# Patient Record
Sex: Female | Born: 1990 | Race: White | Hispanic: Yes | Marital: Single | Smoking: Never smoker
Health system: Southern US, Community
[De-identification: ages and names within clinical notes are randomized; demographics above are authoritative.]

## PROBLEM LIST (undated history)

## (undated) ENCOUNTER — Inpatient Hospital Stay (HOSPITAL_COMMUNITY): Payer: Self-pay

## (undated) DIAGNOSIS — I2699 Other pulmonary embolism without acute cor pulmonale: Secondary | ICD-10-CM

## (undated) DIAGNOSIS — D649 Anemia, unspecified: Secondary | ICD-10-CM

## (undated) DIAGNOSIS — Z789 Other specified health status: Secondary | ICD-10-CM

## (undated) HISTORY — PX: MOUTH SURGERY: SHX715

## (undated) HISTORY — PX: WISDOM TOOTH EXTRACTION: SHX21

## (undated) HISTORY — DX: Other specified health status: Z78.9

## (undated) HISTORY — PX: ABDOMINAL HYSTERECTOMY: SHX81

---
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
## 2011-02-17 LAB — POCT I-STAT, CHEM 8
BUN: 10 mg/dL (ref 6–23)
Calcium, Ion: 1.16 mmol/L (ref 1.12–1.32)
HCT: 35 % — ABNORMAL LOW (ref 36.0–46.0)
Hemoglobin: 11.9 g/dL — ABNORMAL LOW (ref 12.0–15.0)
Sodium: 137 mEq/L (ref 135–145)
TCO2: 22 mmol/L (ref 0–100)

## 2011-02-19 LAB — URINALYSIS, ROUTINE W REFLEX MICROSCOPIC
Glucose, UA: NEGATIVE mg/dL
Ketones, ur: 15 mg/dL — AB
Nitrite: NEGATIVE
Protein, ur: NEGATIVE mg/dL
Urobilinogen, UA: 0.2 mg/dL (ref 0.0–1.0)

## 2011-02-19 LAB — WET PREP, GENITAL: Trich, Wet Prep: NONE SEEN

## 2011-02-19 LAB — GC/CHLAMYDIA PROBE AMP, GENITAL: GC Probe Amp, Genital: NEGATIVE

## 2011-03-17 ENCOUNTER — Inpatient Hospital Stay (HOSPITAL_COMMUNITY)
Admission: AD | Admit: 2011-03-17 | Discharge: 2011-03-20 | DRG: 775 | Disposition: A | Discharge status: Home / Self Care | Payer: Medicaid Other | Source: Ambulatory Visit | Attending: Obstetrics | Admitting: Obstetrics

## 2011-03-17 LAB — CBC
Hemoglobin: 13.3 g/dL (ref 12.0–15.0)
MCH: 28.9 pg (ref 26.0–34.0)
MCV: 83.7 fL (ref 78.0–100.0)
Platelets: 192 10*3/uL (ref 150–400)
RBC: 4.6 MIL/uL (ref 3.87–5.11)
WBC: 10.6 10*3/uL — ABNORMAL HIGH (ref 4.0–10.5)

## 2011-03-19 LAB — CBC
HCT: 30.2 % — ABNORMAL LOW (ref 36.0–46.0)
Hemoglobin: 10.2 g/dL — ABNORMAL LOW (ref 12.0–15.0)
MCH: 28.5 pg (ref 26.0–34.0)
MCHC: 33.8 g/dL (ref 30.0–36.0)
RBC: 3.58 MIL/uL — ABNORMAL LOW (ref 3.87–5.11)

## 2011-05-08 ENCOUNTER — Inpatient Hospital Stay (HOSPITAL_COMMUNITY): Admission: AD | Admit: 2011-05-08 | Payer: Medicaid Other | Source: Ambulatory Visit | Admitting: Obstetrics

## 2014-12-03 ENCOUNTER — Ambulatory Visit (INDEPENDENT_AMBULATORY_CARE_PROVIDER_SITE_OTHER): Payer: Medicaid Other | Admitting: Obstetrics and Gynecology

## 2014-12-03 ENCOUNTER — Encounter: Payer: Self-pay | Admitting: Obstetrics and Gynecology

## 2014-12-03 VITALS — BP 102/63 | HR 83 | Temp 98.2°F | Wt 140.7 lb

## 2014-12-03 DIAGNOSIS — O0933 Supervision of pregnancy with insufficient antenatal care, third trimester: Secondary | ICD-10-CM | POA: Insufficient documentation

## 2014-12-03 DIAGNOSIS — Z1151 Encounter for screening for human papillomavirus (HPV): Secondary | ICD-10-CM | POA: Diagnosis not present

## 2014-12-03 DIAGNOSIS — Z118 Encounter for screening for other infectious and parasitic diseases: Secondary | ICD-10-CM

## 2014-12-03 DIAGNOSIS — Z113 Encounter for screening for infections with a predominantly sexual mode of transmission: Secondary | ICD-10-CM | POA: Diagnosis not present

## 2014-12-03 DIAGNOSIS — Z124 Encounter for screening for malignant neoplasm of cervix: Secondary | ICD-10-CM

## 2014-12-03 DIAGNOSIS — O09213 Supervision of pregnancy with history of pre-term labor, third trimester: Secondary | ICD-10-CM

## 2014-12-03 LAB — POCT URINALYSIS DIP (DEVICE)
Bilirubin Urine: NEGATIVE
GLUCOSE, UA: NEGATIVE mg/dL
Hgb urine dipstick: NEGATIVE
Ketones, ur: NEGATIVE mg/dL
LEUKOCYTES UA: NEGATIVE
NITRITE: NEGATIVE
PH: 7.5 (ref 5.0–8.0)
Protein, ur: NEGATIVE mg/dL
Specific Gravity, Urine: 1.015 (ref 1.005–1.030)
UROBILINOGEN UA: 0.2 mg/dL (ref 0.0–1.0)

## 2014-12-03 NOTE — Progress Notes (Signed)
Subjective:    Maria Riley is a [id number] [id number] being seen today for her first obstetrical visit.  Her obstetrical history is significant for previous 36 week delivery with Dr. Gaynell FaceMarshall in 2012, uncomplicated pregnancy and insufficient care (onset of care at 34 weeks by LMP). Patient reports uncomplicated pregnancy thus far with the exception of a [place] in [place] secondary to vaginal bleeding and contractions. Patient received BMZ on 12/14 and 12/15. Patient does intend to breast feed. Pregnancy history fully reviewed.  Patient reports vaginal bleeding.  Filed Vitals:   12/03/14 1012  BP: 102/63  Pulse: 83  Temp: 98.2 F (36.8 C)  Weight: 140 lb 11.2 oz (63.821 kg)    HISTORY: OB History  Gravida Para Term Preterm AB SAB TAB Ectopic Multiple Living  2 1 0 1      1    # Outcome Date GA Lbr Len/2nd Weight Sex Delivery Anes PTL Lv  2 Current           1 Preterm 03/18/11 369w0d   M Vag-Spont        Past Medical History  Diagnosis Date  . Medical history non-contributory    History reviewed. No pertinent past surgical history. Family History  Problem Relation Age of Onset  . Alcohol abuse Neg Hx   . Arthritis Neg Hx   . Asthma Neg Hx   . Birth defects Neg Hx   . Cancer Neg Hx   . COPD Neg Hx   . Depression Neg Hx   . Diabetes Neg Hx   . Drug abuse Neg Hx   . Early death Neg Hx   . Hearing loss Neg Hx   . Heart disease Neg Hx   . Hyperlipidemia Neg Hx   . Hypertension Neg Hx   . Kidney disease Neg Hx   . Learning disabilities Neg Hx   . Mental illness Neg Hx   . Mental retardation Neg Hx   . Miscarriages / Stillbirths Neg Hx   . Stroke Neg Hx   . Vision loss Neg Hx   . Varicose Veins Neg Hx      Exam    Uterus:     Pelvic Exam:    Perineum: Normal Perineum   Vulva: normal   Vagina:  normal mucosa, normal discharge, no blood in vault   pH:    Cervix: ft/soft/long   Adnexa: not evaluated   Bony Pelvis: gynecoid  System: Breast:  normal  appearance, no masses or tenderness   Skin: normal coloration and turgor, no rashes    Neurologic: oriented, no focal deficits   Extremities: normal strength, tone, and muscle mass   HEENT extra ocular movement intact   Mouth/Teeth mucous membranes moist, pharynx normal without lesions and dental hygiene good   Neck supple and no masses   Cardiovascular: regular rate and rhythm   Respiratory:  chest clear, no wheezing, crepitations, rhonchi, normal symmetric air entry   Abdomen: soft, gravid, NT   Urinary:       Assessment:    Pregnancy: G2P0101 Patient Active Problem List   Diagnosis Date Noted  . Insufficient prenatal care in third trimester 12/03/2014  . Previous preterm delivery in third trimester, antepartum 12/03/2014        Plan:     Initial labs drawn. Prenatal vitamins. Problem list reviewed and updated. Genetic Screening discussed : too late.  Ultrasound discussed; fetal survey: ordered. No blood seen in vault. Precautions given.   Follow up  in 1 weeks. 50% of 30 min visit spent on counseling and coordination of care.     Maria Riley 12/03/2014

## 2014-12-03 NOTE — Progress Notes (Signed)
Pt reports she was given Betamethasone on 12/14 and 12/15 because they are concerned that she will go into labor early. She was bleeding at that time.  Today she notices blood tinged mucus.  Pt doesn't have ultrasound report with her but reports that her lmp is 04/08/14. 1hr due at 1125

## 2014-12-03 NOTE — Progress Notes (Signed)
U/S 12/05/14 @ 330p with Radiology.

## 2014-12-03 NOTE — Patient Instructions (Signed)
Tercer trimestre de embarazo (Third Trimester of Pregnancy) El tercer trimestre va desde la semana29 hasta la 42, desde el sptimo hasta el noveno mes, y es la poca en la que el feto crece ms rpidamente. Hacia el final del noveno mes, el feto mide alrededor de 20pulgadas (45cm) de largo y pesa entre 6 y 10 libras (2,700 y 4,500kg).  CAMBIOS EN EL ORGANISMO Su organismo atraviesa por muchos cambios durante el embarazo, y estos varan de una mujer a otra.   Seguir aumentando de peso. Es de esperar que aumente entre 25 y 35libras (11 y 16kg) hacia el final del embarazo.  Podrn aparecer las primeras estras en las caderas, el abdomen y las mamas.  Puede tener necesidad de orinar con ms frecuencia porque el feto baja hacia la pelvis y ejerce presin sobre la vejiga.  Debido al embarazo podr sentir acidez estomacal con frecuencia.  Puede estar estreida, ya que ciertas hormonas enlentecen los movimientos de los msculos que empujan los desechos a travs de los intestinos.  Pueden aparecer hemorroides o abultarse e hincharse las venas (venas varicosas).  Puede sentir dolor plvico debido al aumento de peso y a que las hormonas del embarazo relajan las articulaciones entre los huesos de la pelvis. El dolor de espalda puede ser consecuencia de la sobrecarga de los msculos que soportan la postura.  Tal vez haya cambios en el cabello que pueden incluir su engrosamiento, crecimiento rpido y cambios en la textura. Adems, a algunas mujeres se les cae el cabello durante o despus del embarazo, o tienen el cabello seco o fino. Lo ms probable es que el cabello se le normalice despus del nacimiento del beb.  Las mamas seguirn creciendo y le dolern. A veces, puede haber una secrecin amarilla de las mamas llamada calostro.  El ombligo puede salir hacia afuera.  Puede sentir que le falta el aire debido a que se expande el tero.  Puede notar que el feto "baja" o lo siente ms bajo, en  el abdomen.  Puede tener una prdida de secrecin mucosa con sangre. Esto suele ocurrir en el trmino de unos pocos das a una semana antes de que comience el trabajo de parto.  El cuello del tero se vuelve delgado y blando (se borra) cerca de la fecha de parto. QU DEBE ESPERAR EN LOS EXMENES PRENATALES  Le harn exmenes prenatales cada 2semanas hasta la semana36. A partir de ese momento le harn exmenes semanales. Durante una visita prenatal de rutina:  La pesarn para asegurarse de que usted y el feto estn creciendo normalmente.  Le tomarn la presin arterial.  Le medirn el abdomen para controlar el desarrollo del beb.  Se escucharn los latidos cardacos fetales.  Se evaluarn los resultados de los estudios solicitados en visitas anteriores.  Le revisarn el cuello del tero cuando est prxima la fecha de parto para controlar si este se ha borrado. Alrededor de la semana36, el mdico le revisar el cuello del tero. Al mismo tiempo, realizar un anlisis de las secreciones del tejido vaginal. Este examen es para determinar si hay un tipo de bacteria, estreptococo Grupo B. El mdico le explicar esto con ms detalle. El mdico puede preguntarle lo siguiente:  Cmo le gustara que fuera el parto.  Cmo se siente.  Si siente los movimientos del beb.  Si ha tenido sntomas anormales, como prdida de lquido, sangrado, dolores de cabeza intensos o clicos abdominales.  Si tiene alguna pregunta. Otros exmenes o estudios de deteccin que pueden realizarse   durante el tercer trimestre incluyen lo siguiente:  Anlisis de sangre para controlar las concentraciones de hierro (anemia).  Controles fetales para determinar su salud, nivel de actividad y crecimiento. Si tiene alguna enfermedad o hay problemas durante el embarazo, le harn estudios. FALSO TRABAJO DE PARTO Es posible que sienta contracciones leves e irregulares que finalmente desaparecen. Se llaman contracciones de  Braxton Hicks o falso trabajo de parto. Las contracciones pueden durar horas, das o incluso semanas, antes de que el verdadero trabajo de parto se inicie. Si las contracciones ocurren a intervalos regulares, se intensifican o se hacen dolorosas, lo mejor es que la revise el mdico.  SIGNOS DE TRABAJO DE PARTO   Clicos de tipo menstrual.  Contracciones cada 5minutos o menos.  Contracciones que comienzan en la parte superior del tero y se extienden hacia abajo, a la zona inferior del abdomen y la espalda.  Sensacin de mayor presin en la pelvis o dolor de espalda.  Una secrecin de mucosidad acuosa o con sangre que sale de la vagina. Si tiene alguno de estos signos antes de la semana37 del embarazo, llame a su mdico de inmediato. Debe concurrir al hospital para que la controlen inmediatamente. INSTRUCCIONES PARA EL CUIDADO EN EL HOGAR   Evite fumar, consumir hierbas, beber alcohol y tomar frmacos que no le hayan recetado. Estas sustancias qumicas afectan la formacin y el desarrollo del beb.  Siga las indicaciones del mdico en relacin con el uso de medicamentos. Durante el embarazo, hay medicamentos que son seguros de tomar y otros que no.  Haga actividad fsica solo en la forma indicada por el mdico. Sentir clicos uterinos es un buen signo para detener la actividad fsica.  Contine comiendo alimentos que sanos con regularidad.  Use un sostn que le brinde buen soporte si le duelen las mamas.  No se d baos de inmersin en agua caliente, baos turcos ni saunas.  Colquese el cinturn de seguridad cuando conduzca.  No coma carne cruda ni queso sin cocinar; evite el contacto con las bandejas sanitarias de los gatos y la tierra que estos animales usan. Estos elementos contienen grmenes que pueden causar defectos congnitos en el beb.  Tome las vitaminas prenatales.  Si est estreida, pruebe un laxante suave (si el mdico lo autoriza). Consuma ms alimentos ricos en  fibra, como vegetales y frutas frescos y cereales integrales. Beba gran cantidad de lquido para mantener la orina de tono claro o color amarillo plido.  Dese baos de asiento con agua tibia para aliviar el dolor o las molestias causadas por las hemorroides. Use una crema para las hemorroides si el mdico la autoriza.  Si tiene venas varicosas, use medias de descanso. Eleve los pies durante 15minutos, 3 o 4veces por da. Limite la cantidad de sal en su dieta.  Evite levantar objetos pesados, use zapatos de tacones bajos y mantenga una buena postura.  Descanse con las piernas elevadas si tiene calambres o dolor de cintura.  Visite a su dentista si no lo ha hecho durante el embarazo. Use un cepillo de dientes blando para higienizarse los dientes y psese el hilo dental con suavidad.  Puede seguir manteniendo relaciones sexuales, a menos que el mdico le indique lo contrario.  No haga viajes largos excepto que sea absolutamente necesario y solo con la autorizacin del mdico.  Tome clases prenatales para entender, practicar y hacer preguntas sobre el trabajo de parto y el parto.  Haga un ensayo de la partida al hospital.  Prepare el bolso que   llevar al hospital.  Prepare la habitacin del beb.  Concurra a todas las visitas prenatales segn las indicaciones de su mdico. SOLICITE ATENCIN MDICA SI:  No est segura de que est en trabajo de parto o de que ha roto la bolsa de las aguas.  Tiene mareos.  Siente clicos leves, presin en la pelvis o dolor persistente en el abdomen.  Tiene nuseas, vmitos o diarrea persistentes.  Tiene secrecin vaginal con mal olor.  Siente dolor al orinar. SOLICITE ATENCIN MDICA DE INMEDIATO SI:   Tiene fiebre.  Tiene una prdida de lquido por la vagina.  Tiene sangrado o pequeas prdidas vaginales.  Siente dolor intenso o clicos en el abdomen.  Sube o baja de peso rpidamente.  Tiene dificultad para respirar y siente dolor de  pecho.  Sbitamente se le hinchan mucho el rostro, las manos, los tobillos, los pies o las piernas.  No ha sentido los movimientos del beb durante una hora.  Siente un dolor de cabeza intenso que no se alivia con medicamentos.  Hay cambios en la visin. Document Released: 09/02/2005 Document Revised: 11/28/2013 [employer] Patient Information 2015 [employer]. This information is not intended to replace advice given to you by your health care provider. Make sure you discuss any questions you have with your health care provider.  Eleccin del mtodo anticonceptivo (Contraception Choices) La anticoncepcin (control de la natalidad) es el uso de cualquier mtodo o dispositivo para evitar el embarazo. A continuacin se indican algunos de esos mtodos. MTODOS HORMONALES   El Implante contraconceptivo consiste en un tubo plstico delgado que contiene la hormona progesterona. No contiene estrgenos. El mdico inserta el tubo en la parte interna del brazo. El tubo puede permanecer en el lugar durante 3 aos. Despus de los 3 aos debe retirarse. El implante impide que los ovarios liberen vulos (ovulacin), espesa el moco cervical, lo que evita que los espermatozoides ingresen al tero y hace ms delgada la membrana que cubre el interior del tero.  Inyecciones de progesterona sola: las administra el mdico cada 3 meses para evitar el embarazo. La progesterona sinttica impide que los ovarios liberen vulos. Tambin hacen que el moco cervical se espese y modifique el tejido de recubrimiento interno del tero. Esto hace ms difcil que los espermatozoides sobrevivan en el tero.  Las pldoras anticonceptivas contienen estrgenos y progesterona. Su funcin es evitar que los ovarios liberen vulos (ovulacin). Las hormonas de los anticonceptivos orales hacen que el moco cervical se haga ms espeso, lo que evita que el esperma ingrese al tero. Las pldoras anticonceptivas son recetadas por el  mdico.Tambin se utilizan para tratar los perodos menstruales abundantes.  Minipldora: este tipo de pldora anticonceptiva contiene slo hormona progesterona. Deben tomarse todos los das del mes y debe recetarlas el mdico.  El parche de control de natalidad: contiene hormonas similares a las que contienen las pldoras anticonceptivas. Deben cambiarse una vez por semana y se utilizan bajo prescripcin mdica.  Anillo vaginal: contiene hormonas similares a las que contienen las pldoras anticonceptivas. Se deja colocado durante tres semanas, se lo retira durante 1 semana y luego se coloca uno nuevo. La paciente debe sentirse cmoda al insertar y retirar el anillo de la vagina.Es necesaria la prescripcin mdica.  Anticonceptivos de emergencia: son mtodos para evitar un embarazo despus de una relacin sexual sin proteccin. Esta pldora puede tomarse inmediatamente despus de tener relaciones sexuales o hasta 5 das de haber tenido sexo sin proteccin. Es ms efectiva si se toma poco tiempo despus de   la relacin sexual. Los anticonceptivos de emergencia estn disponibles sin prescripcin mdica. Consltelo con su farmacutico. No use los anticonceptivos de emergencia como nico mtodo anticonceptivo. MTODOS DE BARRERA   Condn masculino: es una vaina delgada (ltex o goma) que se coloca cubriendo al pene durante el acto sexual. Puede usarse con espermicida para aumentar la efectividad.  Condn femenino. Es una funda delicada y blanda que se adapta holgadamente a la vagina antes de las relaciones sexuales.  Diafragma: es una barrera de ltex redonda y suave que debe ser recomendado por un profesional. Se inserta en la vagina, junto con un gel espermicida. Debe insertarse antes de tener relaciones sexuales. Debe dejar el diafragma colocado en la vagina durante 6 a 8 horas despus de la relacin sexual.  Capuchn cervical: es una barrera de ltex o taza plstica redonda y suave que cubre el  cuello del tero y debe ser colocada por un mdico. Puede dejarlo colocado en la vagina hasta 48 horas despus de las relaciones sexuales.  Esponja: es una pieza blanda y circular de espuma de poliuretano. Contiene un espermicida. Se inserta en la vagina despus de mojarla y antes de las relaciones sexuales.  Espermicidas: son sustancias qumicas que matan o bloquean al esperma y no lo dejan ingresar al cuello del tero y al tero. Vienen en forma de cremas, geles, supositorios, espuma o comprimidos. No es necesario tener receta mdica. Se insertan en la vagina con un aplicador antes de tener relaciones sexuales. El proceso debe repetirse cada vez que tiene relaciones sexuales. ANTICONCEPTIVOS INTRAUTERINOS  Dispositivo intrauterino (DIU) es un dispositivo en forma de T que se coloca en el tero durante el perodo menstrual, para evitar el embarazo. Hay dos tipos:  DIU de cobre: este tipo de DIU est recubierto con un alambre de cobre y se inserta dentro del tero. El cobre hace que el tero y las trompas de Falopio produzcan un liquido que destruye los espermatozoides. Puede permanecer colocado durante 10 aos.  DIU con hormona: este tipo de DIU contiene la hormona progestina (progesterona sinttica). La hormona espesa el moco cervical y evita que los espermatozoides ingresen al tero y tambin afina la membrana que cubre el tero para evitar la implantacin del vulo fertilizado. La hormona debilita o destruye los espermatozoides que ingresan al tero. Puede permanecer en el lugar durante 3-5 aos, segn el tipo de DIU que se utilice. MTODOS ANTICONCEPTIVOS PERMANENTES  Ligadura de trompas en la mujer: se realiza sellando, atando u obstruyendo quirrgicamente las trompas de Falopio lo que impide que el vulo descienda hacia el tero.  Esterilizacin histeroscpica: Implica la colocacin de un pequeo espiral o la insercin en cada trompa de Falopio. El mdico utiliza una tcnica llamada  histeroscopa para realizar este procedimiento. El dispositivo produce la formacin de tejido cicatrizal. Esto da como resultado una obstruccin permanente de las trompas de Falopio, de modo que la esperma no pueda fertilizar el vulo. Demora alrededor de 3 meses despus del procedimiento hasta que el conducto se obstruye. Tendr que usar otro mtodo anticonceptivo durante al menos 3 meses.  Esterilizacin masculina: se realiza ligando los conductos por los que pasan los espermatozoides (vasectoma).Esto impide que el esperma ingrese a la vagina durante el acto sexual. Luego del procedimiento, el hombre puede eyacular lquido (semen). MTODOS DE PLANIFICACIN NATURAL  Planificacin familiar natural: consiste en no tener relaciones sexuales o usar un mtodo de barrera (condn, diafragma, capuchn cervical) en los das que la mujer podra quedar embarazada.  Mtodo de calendario: consiste   en el seguimiento de la duracin de cada ciclo menstrual y la identificacin de los perodos frtiles.  Mtodo de ovulacin: consiste en evitar las relaciones sexuales durante la ovulacin.  Mtodo sintotrmico: consiste en evitar las relaciones sexuales en la poca en la que se est ovulando, utilizando un termmetro y tendiendo en cuenta los sntomas de la ovulacin.  Mtodo postovulacin: consiste en planificar las relaciones sexuales para despus de haber ovulado. Independientemente del tipo o mtodo anticonceptivo que usted elija, es importante que use condones para protegerse contra las infecciones de transmisin sexual (ETS). Hable con su mdico con respecto a qu mtodo anticonceptivo es el ms apropiado para usted. Document Released: 11/23/2005 Document Revised: 07/26/2013 [employer] Patient Information 2015 [employer]. This information is not intended to replace advice given to you by your health care provider. Make sure you discuss any questions you have with your health care provider.  Lactancia  materna (Breastfeeding) Decidir amamantar es una de las mejores elecciones que puede hacer por usted y su beb. El cambio hormonal durante el embarazo produce el desarrollo del tejido mamario y aumenta la cantidad y el tamao de los conductos galactforos. Estas hormonas tambin permiten que las protenas, los azcares y las grasas de la sangre produzcan la leche materna en las glndulas productoras de leche. Las hormonas impiden que la leche materna sea liberada antes del nacimiento del beb, adems de impulsar el flujo de leche luego del nacimiento. Una vez que ha comenzado a amamantar, pensar en el beb, as como la succin o el llanto, pueden estimular la liberacin de leche de las glndulas productoras de leche.  LOS BENEFICIOS DE AMAMANTAR Para el beb  La primera leche (calostro) ayuda a mejorar el funcionamiento del sistema digestivo del beb.  La leche tiene anticuerpos que ayudan a prevenir las infecciones en el beb.  El beb tiene una menor incidencia de asma, alergias y del sndrome de muerte sbita del lactante.  Los nutrientes en la leche materna son mejores para el beb que la leche maternizada y estn preparados exclusivamente para cubrir las necesidades del beb.  La leche materna mejora el desarrollo cerebral del beb.  Es menos probable que el beb desarrolle otras enfermedades, como obesidad infantil, asma o diabetes mellitus de tipo 2. Para usted   La lactancia materna favorece el desarrollo de un vnculo muy especial entre la madre y el beb.  Es conveniente. La leche materna siempre est disponible a la temperatura correcta y es econmica.  La lactancia materna ayuda a quemar caloras y a perder el peso ganado durante el embarazo.  Favorece la contraccin del tero al tamao que tena antes del embarazo de manera ms rpida y disminuye el sangrado (loquios) despus del parto.  La lactancia materna contribuye a reducir el riesgo de desarrollar diabetes mellitus de  tipo 2, osteoporosis o cncer de mama o de ovario en el futuro. SIGNOS DE QUE EL BEB EST HAMBRIENTO Primeros signos de hambre  Aumenta su estado de alerta o actividad.  Se estira.  Mueve la cabeza de un lado a otro.  Mueve la cabeza y abre la boca cuando se le toca la mejilla o la comisura de la boca (reflejo de bsqueda).  Aumenta las vocalizaciones, tales como sonidos de succin, se relame los labios, emite arrullos, suspiros, o chirridos.  Mueve la mano hacia la boca.  Se chupa con ganas los dedos o las manos. Signos tardos de hambre  Est agitado.  Llora de manera intermitente. Signos de hambre extrema   Los signos de hambre extrema requerirn que lo calme y lo consuele antes de que el beb pueda alimentarse adecuadamente. No espere a que se manifiesten los siguientes signos de hambre extrema para comenzar a amamantar:   Agitacin.  Llanto intenso y fuerte.   Gritos. INFORMACIN BSICA SOBRE LA LACTANCIA MATERNA Iniciacin de la lactancia materna  Encuentre un lugar cmodo para sentarse o acostarse, con un buen respaldo para el cuello y la espalda.  Coloque una almohada o una manta enrollada debajo del beb para acomodarlo a la altura de la mama (si est sentada). Las almohadas para amamantar se han diseado especialmente a fin de servir de apoyo para los brazos y el beb mientras amamanta.  Asegrese de que el abdomen del beb est frente al suyo.  Masajee suavemente la mama. Con las yemas de los dedos, masajee la pared del pecho hacia el pezn en un movimiento circular. Esto estimula el flujo de leche. Es posible que deba continuar este movimiento mientras amamanta si la leche fluye lentamente.  Sostenga la mama con el pulgar por arriba del pezn y los otros 4 dedos por debajo de la mama. Asegrese de que los dedos se encuentren lejos del pezn y de la boca del beb.  Empuje suavemente los labios del beb con el pezn o con el dedo.  Cuando la boca del beb se abra  lo suficiente, acrquelo rpidamente a la mama e introduzca todo el pezn y la zona oscura que lo rodea (areola), tanto como sea posible, dentro de la boca del beb.  Debe haber ms areola visible por arriba del labio superior del beb que por debajo del labio inferior.  La lengua del beb debe estar entre la enca inferior y la mama.  Asegrese de que la boca del beb est en la posicin correcta alrededor del pezn (prendida). Los labios del beb deben crear un sello sobre la mama y estar doblados hacia afuera (invertidos).  Es comn que el beb succione durante 2 a 3 minutos para que comience el flujo de leche materna. Cmo debe prenderse Es muy importante que le ensee al beb cmo prenderse adecuadamente a la mama. Si el beb no se prende adecuadamente, puede causarle dolor en el pezn y reducir la produccin de leche materna, y hacer que el beb tenga un escaso aumento de peso. Adems, si el beb no se prende adecuadamente al pezn, puede tragar aire durante la alimentacin. Esto puede causarle molestias al beb. Hacer eructar al beb al cambiar de mama puede ayudarlo a liberar el aire. Sin embargo, ensearle al beb cmo prenderse a la mama adecuadamente es la mejor manera de evitar que se sienta molesto por tragar aire mientras se alimenta. Signos de que el beb se ha prendido adecuadamente al pezn:   Tironea o succiona de modo silencioso, sin causarle dolor.  Se escucha que traga cada 3 o 4 succiones.   Hay movimientos musculares por arriba y por delante de sus odos al succionar. Signos de que el beb no se ha prendido adecuadamente al pezn:   Hace ruidos de succin o de chasquido mientras se alimenta.  Siente dolor en el pezn. Si cree que el beb no se prendi correctamente, deslice el dedo en la comisura de la boca y colquelo entre las encas del beb para interrumpir la succin. Intente comenzar a amamantar nuevamente. Signos de lactancia materna exitosa Signos del beb:    Disminuye gradualmente el nmero de succiones o cesa la succin por completo.  Se   duerme.  Relaja el cuerpo.  Retiene una pequea cantidad de leche en la boca.  Se desprende solo del pecho. Signos que presenta usted:  Las mamas han aumentado la firmeza, el peso y el tamao 1 a 3 horas despus de amamantar.  Estn ms blandas inmediatamente despus de amamantar.  Un aumento del volumen de leche, y tambin un cambio en su consistencia y color se producen hacia el quinto da de lactancia materna.  Los pezones no duelen, ni estn agrietados ni sangran. Signos de que su beb recibe la cantidad de leche suficiente  Moja al menos 3 paales en 24 horas. La orina debe ser clara y de color amarillo plido a los 5 das de vida.  Defeca al menos 3 veces en 24 horas a los 5 das de vida. La materia fecal debe ser blanda y amarillenta.  Defeca al menos 3 veces en 24 horas a los 7 das de vida. La materia fecal debe ser grumosa y amarillenta.  No registra una prdida de peso mayor del 10% del peso al nacer durante los primeros 3 das de vida.  Aumenta de peso un promedio de 4 a 7onzas (113 a 198g) por semana despus de los 4 das de vida.  Aumenta de peso, diariamente, de manera uniforme a partir de los 5 das de vida, sin registrar prdida de peso despus de las 2semanas de vida. Despus de alimentarse, es posible que el beb regurgite una pequea cantidad. Esto es frecuente. FRECUENCIA Y DURACIN DE LA LACTANCIA MATERNA El amamantamiento frecuente la ayudar a producir ms leche y a prevenir problemas de dolor en los pezones e hinchazn en las mamas. Alimente al beb cuando muestre signos de hambre o si siente la necesidad de reducir la congestin de las mamas. Esto se denomina "lactancia a demanda". Evite el uso del chupete mientras trabaja para establecer la lactancia (las primeras 4 a 6 semanas despus del nacimiento del beb). Despus de este perodo, podr ofrecerle un chupete. Las  investigaciones demostraron que el uso del chupete durante el primer ao de vida del beb disminuye el riesgo de desarrollar el sndrome de muerte sbita del lactante (SMSL). Permita que el nio se alimente en cada mama todo lo que desee. Contine amamantando al beb hasta que haya terminado de alimentarse. Cuando el beb se desprende o se queda dormido mientras se est alimentando de la primera mama, ofrzcale la segunda. Debido a que, con frecuencia, los recin nacidos permanecen somnolientos las primeras semanas de vida, es posible que deba despertar al beb para alimentarlo. Los horarios de lactancia varan de un beb a otro. Sin embargo, las siguientes reglas pueden servir como gua para ayudarla a garantizar que el beb se alimenta adecuadamente:  Se puede amamantar a los recin nacidos (bebs de 4 semanas o menos de vida) cada 1 a 3 horas.  No deben transcurrir ms de 3 horas durante el da o 5 horas durante la noche sin que se amamante a los recin nacidos.  Debe amamantar al beb 8 veces como mnimo en un perodo de 24 horas, hasta que comience a introducir slidos en su dieta, a los 6 meses de vida aproximadamente. EXTRACCIN DE LECHE MATERNA La extraccin y el almacenamiento de la leche materna le permiten asegurarse de que el beb se alimente exclusivamente de leche materna, aun en momentos en los que no puede amamantar. Esto tiene especial importancia si debe regresar al trabajo en el perodo en que an est amamantando o si no puede estar   presente en los momentos en que el beb debe alimentarse. Su asesor en lactancia puede orientarla sobre cunto tiempo es seguro almacenar leche materna.  El sacaleche es un aparato que le permite extraer leche de la mama a un recipiente estril. Luego, la leche materna extrada puede almacenarse en un refrigerador o congelador. Algunos sacaleches son manuales, mientras que otros son elctricos. Consulte a su asesor en lactancia qu tipo ser ms conveniente  para usted. Los sacaleches se pueden comprar; sin embargo, algunos hospitales y grupos de apoyo a la lactancia materna alquilan sacaleches mensualmente. Un asesor en lactancia puede ensearle cmo extraer leche materna manualmente, en caso de que prefiera no usar un sacaleche.  CMO CUIDAR LAS MAMAS DURANTE LA LACTANCIA MATERNA Los pezones se secan, agrietan y duelen durante la lactancia materna. Las siguientes recomendaciones pueden ayudarla a mantener las mamas humectadas y sanas:  Evite usar jabn en los pezones.  Use un sostn de soporte. Aunque no son esenciales, las camisetas sin mangas o los sostenes especiales para amamantar estn diseados para acceder fcilmente a las mamas, para amamantar sin tener que quitarse todo el sostn o la camiseta. Evite usar sostenes con aro o sostenes muy ajustados.  Seque al aire sus pezones durante 3 a 4minutos despus de amamantar al beb.  Utilice solo apsitos de algodn en el sostn para absorber las prdidas de leche. La prdida de un poco de leche materna entre las tomas es normal.  Utilice lanolina sobre los pezones luego de amamantar. La lanolina ayuda a mantener la humedad normal de la piel. Si usa lanolina pura, no tiene que lavarse los pezones antes de volver a alimentar al beb. La lanolina pura no es txica para el beb. Adems, puede extraer manualmente algunas gotas de leche materna y masajear suavemente esa leche sobre los pezones, para que la leche se seque al aire. Durante las primeras semanas despus de dar a luz, algunas mujeres pueden experimentar hinchazn en las mamas (congestin mamaria). La congestin puede hacer que sienta las mamas pesadas, calientes y sensibles al tacto. El pico de la congestin ocurre dentro de los 3 a 5 das despus del parto. Las siguientes recomendaciones pueden ayudarla a aliviar la congestin:  Vace por completo las mamas al amamantar o extraer leche. Puede aplicar calor hmedo en las mamas (en la ducha o con  toallas hmedas para manos) antes de amamantar o extraer leche. Esto aumenta la circulacin y ayuda a que la leche fluya. Si el beb no vaca por completo las mamas cuando lo amamanta, extraiga la leche restante despus de que haya finalizado.  Use un sostn ajustado (para amamantar o comn) o una camiseta sin mangas durante 1 o 2 das para indicar al cuerpo que disminuya ligeramente la produccin de leche.  Aplique compresas de hielo sobre las mamas, a menos que le resulte demasiado incmodo.  Asegrese de que el beb est prendido y se encuentre en la posicin correcta mientras lo alimenta. Si la congestin persiste luego de 48 horas o despus de seguir estas recomendaciones, comunquese con su mdico o un asesor en lactancia. RECOMENDACIONES GENERALES PARA EL CUIDADO DE LA SALUD DURANTE LA LACTANCIA MATERNA  Consuma alimentos saludables. Alterne comidas y colaciones, y coma 3 de cada una por da. Dado que lo que come afecta la leche materna, es posible que algunas comidas hagan que su beb se vuelva ms irritable de lo habitual. Evite comer este tipo de alimentos si percibe que afectan de manera negativa al beb.  Beba leche,   jugos de fruta y agua para satisfacer su sed (aproximadamente 10 vasos al da).  Descanse con frecuencia, reljese y tome sus vitaminas prenatales para evitar la fatiga, el estrs y la anemia.  Contine con los autocontroles de la mama.  Evite masticar y fumar tabaco.  Evite el consumo de alcohol y drogas. Algunos medicamentos, que pueden ser perjudiciales para el beb, pueden pasar a travs de la leche materna. Es importante que consulte a su mdico antes de tomar cualquier medicamento, incluidos todos los medicamentos recetados y de venta libre, as como los suplementos vitamnicos y herbales. Puede quedar embarazada durante la lactancia. Si desea controlar la natalidad, consulte a su mdico cules son las opciones ms seguras para el beb. SOLICITE ATENCIN MDICA  SI:   Usted siente que quiere dejar de amamantar o se siente frustrada con la lactancia.  Siente dolor en las mamas o en los pezones.  Sus pezones estn agrietados o sangran.  Sus pechos estn irritados, sensibles o calientes.  Tiene un rea hinchada en cualquiera de las mamas.  Siente escalofros o fiebre.  Tiene nuseas o vmitos.  Presenta una secrecin de otro lquido distinto de la leche materna de los pezones.  Sus mamas no se llenan antes de amamantar al beb para el quinto da despus del parto.  Se siente triste y deprimida.  El beb est demasiado somnoliento como para comer bien.  El beb tiene problemas para dormir.  Moja menos de 3 paales en 24 horas.  Defeca menos de 3 veces en 24 horas.  La piel del beb o la parte blanca de los ojos se vuelven amarillentas.  El beb no ha aumentado de peso a los 5 das de vida. SOLICITE ATENCIN MDICA DE INMEDIATO SI:   El beb est muy cansado (letargo) y no se quiere despertar para comer.  Le sube la fiebre sin causa. Document Released: 11/23/2005 Document Revised: 11/28/2013 ExitCare Patient Information 2015 [employer]. This information is not intended to replace advice given to you by your health care provider. Make sure you discuss any questions you have with your health care provider.  

## 2014-12-04 LAB — CYTOLOGY - PAP

## 2014-12-04 LAB — PRENATAL PROFILE (SOLSTAS)
Antibody Screen: NEGATIVE
Basophils Absolute: 0 10*3/uL (ref 0.0–0.1)
Basophils Relative: 0 % (ref 0–1)
Eosinophils Absolute: 0.1 10*3/uL (ref 0.0–0.7)
Eosinophils Relative: 1 % (ref 0–5)
HEMATOCRIT: 35.9 % — AB (ref 36.0–46.0)
HIV: NONREACTIVE
Hemoglobin: 12.2 g/dL (ref 12.0–15.0)
Hepatitis B Surface Ag: NEGATIVE
LYMPHS ABS: 1 10*3/uL (ref 0.7–4.0)
LYMPHS PCT: 16 % (ref 12–46)
MCH: 29.5 pg (ref 26.0–34.0)
MCHC: 34 g/dL (ref 30.0–36.0)
MCV: 86.7 fL (ref 78.0–100.0)
MONOS PCT: 6 % (ref 3–12)
MPV: 10.7 fL (ref 9.4–12.4)
Monocytes Absolute: 0.4 10*3/uL (ref 0.1–1.0)
NEUTROS PCT: 77 % (ref 43–77)
Neutro Abs: 5 10*3/uL (ref 1.7–7.7)
Platelets: 187 10*3/uL (ref 150–400)
RBC: 4.14 MIL/uL (ref 3.87–5.11)
RDW: 13.2 % (ref 11.5–15.5)
RUBELLA: 2.17 {index} — AB (ref <0.90)
Rh Type: POSITIVE
WBC: 6.5 10*3/uL (ref 4.0–10.5)

## 2014-12-04 LAB — GLUCOSE TOLERANCE, 1 HOUR (50G) W/O FASTING: Glucose, 1 Hour GTT: 121 mg/dL (ref 70–140)

## 2014-12-05 ENCOUNTER — Ambulatory Visit (HOSPITAL_COMMUNITY)
Admission: RE | Admit: 2014-12-05 | Discharge: 2014-12-05 | Disposition: A | Discharge status: Home / Self Care | Payer: Medicaid Other | Source: Ambulatory Visit | Attending: Obstetrics and Gynecology | Admitting: Obstetrics and Gynecology

## 2014-12-05 DIAGNOSIS — O09213 Supervision of pregnancy with history of pre-term labor, third trimester: Secondary | ICD-10-CM | POA: Insufficient documentation

## 2014-12-05 DIAGNOSIS — Z36 Encounter for antenatal screening of mother: Secondary | ICD-10-CM | POA: Diagnosis present

## 2014-12-05 DIAGNOSIS — O0933 Supervision of pregnancy with insufficient antenatal care, third trimester: Secondary | ICD-10-CM

## 2014-12-05 DIAGNOSIS — Z3A34 34 weeks gestation of pregnancy: Secondary | ICD-10-CM | POA: Diagnosis not present

## 2014-12-05 LAB — PRESCRIPTION MONITORING PROFILE (19 PANEL)
AMPHETAMINE/METH: NEGATIVE ng/mL
BUPRENORPHINE, URINE: NEGATIVE ng/mL
Barbiturate Screen, Urine: NEGATIVE ng/mL
Benzodiazepine Screen, Urine: NEGATIVE ng/mL
COCAINE METABOLITES: NEGATIVE ng/mL
CREATININE, URINE: 86.07 mg/dL (ref >20.0)
Cannabinoid Scrn, Ur: NEGATIVE ng/mL
Carisoprodol, Urine: NEGATIVE ng/mL
FENTANYL URINE: NEGATIVE ng/mL
MDMA URINE: NEGATIVE ng/mL
MEPERIDINE UR: NEGATIVE ng/mL
METHADONE SCREEN, URINE: NEGATIVE ng/mL
METHAQUALONE SCREEN (URINE): NEGATIVE ng/mL
Nitrites, Initial: NEGATIVE ug/mL
OXYCODONE SCRN UR: NEGATIVE ng/mL
Opiate Screen, Urine: NEGATIVE ng/mL
PHENCYCLIDINE, UR: NEGATIVE ng/mL
Propoxyphene: NEGATIVE ng/mL
Tapentadol, urine: NEGATIVE ng/mL
Tramadol Scrn, Ur: NEGATIVE ng/mL
Zolpidem, Urine: NEGATIVE ng/mL
pH, Initial: 7.5 pH (ref 4.5–8.9)

## 2014-12-05 LAB — HEMOGLOBINOPATHY EVALUATION
Hemoglobin Other: 0 %
Hgb A2 Quant: 2.8 % (ref 2.2–3.2)
Hgb A: 97.2 % (ref 96.8–97.8)
Hgb F Quant: 0 % (ref 0.0–2.0)
Hgb S Quant: 0 %

## 2014-12-05 LAB — CULTURE, OB URINE: Colony Count: 4000

## 2014-12-06 DIAGNOSIS — O0933 Supervision of pregnancy with insufficient antenatal care, third trimester: Secondary | ICD-10-CM | POA: Insufficient documentation

## 2014-12-06 DIAGNOSIS — Z3A34 34 weeks gestation of pregnancy: Secondary | ICD-10-CM | POA: Insufficient documentation

## 2014-12-06 DIAGNOSIS — Z3689 Encounter for other specified antenatal screening: Secondary | ICD-10-CM | POA: Insufficient documentation

## 2014-12-07 NOTE — L&D Delivery Note (Signed)
Delivery Note At 06:15AM a viable female was delivered over an intact perineum via NSVD (Presentation: LOA).  APGAR: 9, 9; weight pending at time of note. Infant placed immediately on maternal abdomen. Cord clamped x2, cut by friend of mother. Cord blood obtained. Pitocin infusion was begun and an intact placenta with 3VC was delivered shortly thereafter with continuous cord tract without complication.   Anesthesia: Epidural  Episiotomy: None Lacerations: None Suture repair: None Estimated blood loss: 350cc  Mom to postpartum.  Baby to couplet care/skin-to-skin.  Dr. Loreta AveAcosta was present throughout delivery.   Patrica Mendell B. Jarvis NewcomerGrunz, MD, PGY-2 12/19/2014 6:41 AM

## 2014-12-18 ENCOUNTER — Inpatient Hospital Stay (HOSPITAL_COMMUNITY)
Admission: AD | Admit: 2014-12-18 | Discharge: 2014-12-20 | DRG: 775 | Disposition: A | Discharge status: Home / Self Care | Payer: Medicaid Other | Source: Ambulatory Visit | Attending: Obstetrics & Gynecology | Admitting: Obstetrics & Gynecology

## 2014-12-18 ENCOUNTER — Ambulatory Visit (INDEPENDENT_AMBULATORY_CARE_PROVIDER_SITE_OTHER): Payer: Medicaid Other | Admitting: Advanced Practice Midwife

## 2014-12-18 ENCOUNTER — Inpatient Hospital Stay (HOSPITAL_COMMUNITY): Payer: Medicaid Other | Admitting: Anesthesiology

## 2014-12-18 ENCOUNTER — Encounter: Payer: Self-pay | Admitting: Advanced Practice Midwife

## 2014-12-18 ENCOUNTER — Encounter (HOSPITAL_COMMUNITY): Payer: Self-pay | Admitting: *Deleted

## 2014-12-18 VITALS — BP 103/62 | HR 75 | Temp 98.0°F | Wt 143.8 lb

## 2014-12-18 DIAGNOSIS — Z3A36 36 weeks gestation of pregnancy: Secondary | ICD-10-CM | POA: Diagnosis present

## 2014-12-18 DIAGNOSIS — O0933 Supervision of pregnancy with insufficient antenatal care, third trimester: Secondary | ICD-10-CM

## 2014-12-18 LAB — CBC
HEMATOCRIT: 36.7 % (ref 36.0–46.0)
HEMOGLOBIN: 12.6 g/dL (ref 12.0–15.0)
MCH: 29.6 pg (ref 26.0–34.0)
MCHC: 34.3 g/dL (ref 30.0–36.0)
MCV: 86.4 fL (ref 78.0–100.0)
Platelets: 206 10*3/uL (ref 150–400)
RBC: 4.25 MIL/uL (ref 3.87–5.11)
RDW: 13 % (ref 11.5–15.5)
WBC: 9.2 10*3/uL (ref 4.0–10.5)

## 2014-12-18 LAB — ABO/RH: ABO/RH(D): A POS

## 2014-12-18 LAB — TYPE AND SCREEN
ABO/RH(D): A POS
Antibody Screen: NEGATIVE

## 2014-12-18 LAB — OB RESULTS CONSOLE GBS: GBS: NEGATIVE

## 2014-12-18 LAB — GROUP B STREP BY PCR: Group B strep by PCR: NEGATIVE

## 2014-12-18 MED ORDER — OXYTOCIN 40 UNITS IN LACTATED RINGERS INFUSION - SIMPLE MED
62.5000 mL/h | INTRAVENOUS | Status: DC
  Filled 2014-12-18: qty 1000

## 2014-12-18 MED ORDER — DIPHENHYDRAMINE HCL 50 MG/ML IJ SOLN
12.5000 mg | INTRAMUSCULAR | Status: DC | PRN
  Administered 2014-12-19: 12.5 mg via INTRAVENOUS
  Filled 2014-12-18: qty 1

## 2014-12-18 MED ORDER — LACTATED RINGERS IV SOLN
500.0000 mL | Freq: Once | INTRAVENOUS | Status: AC
  Administered 2014-12-18: 500 mL via INTRAVENOUS

## 2014-12-18 MED ORDER — LIDOCAINE HCL (PF) 1 % IJ SOLN
30.0000 mL | INTRAMUSCULAR | Status: DC | PRN
  Filled 2014-12-18: qty 30

## 2014-12-18 MED ORDER — PENICILLIN G POTASSIUM 5000000 UNITS IJ SOLR
2.5000 10*6.[IU] | INTRAVENOUS | Status: DC
  Filled 2014-12-18 (×4): qty 2.5

## 2014-12-18 MED ORDER — PHENYLEPHRINE 40 MCG/ML (10ML) SYRINGE FOR IV PUSH (FOR BLOOD PRESSURE SUPPORT)
80.0000 ug | PREFILLED_SYRINGE | INTRAVENOUS | Status: DC | PRN
  Filled 2014-12-18: qty 2

## 2014-12-18 MED ORDER — OXYCODONE-ACETAMINOPHEN 5-325 MG PO TABS: 1.0000 | ORAL_TABLET | ORAL | Status: DC | PRN

## 2014-12-18 MED ORDER — PHENYLEPHRINE 40 MCG/ML (10ML) SYRINGE FOR IV PUSH (FOR BLOOD PRESSURE SUPPORT)
PREFILLED_SYRINGE | INTRAVENOUS | Status: AC
  Filled 2014-12-18: qty 20

## 2014-12-18 MED ORDER — EPHEDRINE 5 MG/ML INJ
10.0000 mg | INTRAVENOUS | Status: DC | PRN
  Filled 2014-12-18: qty 2

## 2014-12-18 MED ORDER — PENICILLIN G POTASSIUM 5000000 UNITS IJ SOLR
5.0000 10*6.[IU] | Freq: Once | INTRAMUSCULAR | Status: AC
  Administered 2014-12-18: 5 10*6.[IU] via INTRAVENOUS
  Filled 2014-12-18: qty 5

## 2014-12-18 MED ORDER — LIDOCAINE HCL (PF) 1 % IJ SOLN
INTRAMUSCULAR | Status: DC | PRN
  Administered 2014-12-18 (×2): 5 mL

## 2014-12-18 MED ORDER — OXYCODONE-ACETAMINOPHEN 5-325 MG PO TABS: 2.0000 | ORAL_TABLET | ORAL | Status: DC | PRN

## 2014-12-18 MED ORDER — FLEET ENEMA 7-19 GM/118ML RE ENEM
1.0000 | ENEMA | RECTAL | Status: DC | PRN
Start: 2014-12-18 — End: 2014-12-19

## 2014-12-18 MED ORDER — CITRIC ACID-SODIUM CITRATE 334-500 MG/5ML PO SOLN: 30.0000 mL | ORAL | Status: DC | PRN

## 2014-12-18 MED ORDER — OXYTOCIN BOLUS FROM INFUSION: 500.0000 mL | INTRAVENOUS | Status: DC

## 2014-12-18 MED ORDER — FENTANYL 2.5 MCG/ML BUPIVACAINE 1/10 % EPIDURAL INFUSION (WH - ANES)
INTRAMUSCULAR | Status: AC
  Administered 2014-12-18: 14 mL/h via EPIDURAL
  Filled 2014-12-18: qty 125

## 2014-12-18 MED ORDER — ONDANSETRON HCL 4 MG/2ML IJ SOLN: 4.0000 mg | Freq: Four times a day (QID) | INTRAMUSCULAR | Status: DC | PRN

## 2014-12-18 MED ORDER — LACTATED RINGERS IV SOLN
INTRAVENOUS | Status: DC
  Administered 2014-12-18 (×2): via INTRAVENOUS

## 2014-12-18 MED ORDER — FENTANYL CITRATE 0.05 MG/ML IJ SOLN
100.0000 ug | INTRAMUSCULAR | Status: DC | PRN
Start: 2014-12-18 — End: 2014-12-19

## 2014-12-18 MED ORDER — ACETAMINOPHEN 325 MG PO TABS
650.0000 mg | ORAL_TABLET | ORAL | Status: DC | PRN
  Administered 2014-12-18: 650 mg via ORAL
  Filled 2014-12-18: qty 2

## 2014-12-18 MED ORDER — INFLUENZA VAC SPLIT QUAD 0.5 ML IM SUSY
0.5000 mL | PREFILLED_SYRINGE | INTRAMUSCULAR | Status: DC
  Filled 2014-12-18: qty 0.5

## 2014-12-18 MED ORDER — PRENATAL 27-0.8 MG PO TABS
1.0000 | ORAL_TABLET | Freq: Every day | ORAL | Status: DC
  Filled 2014-12-18 (×3): qty 1

## 2014-12-18 MED ORDER — FENTANYL 2.5 MCG/ML BUPIVACAINE 1/10 % EPIDURAL INFUSION (WH - ANES)
14.0000 mL/h | INTRAMUSCULAR | Status: DC | PRN
  Administered 2014-12-18: 14 mL/h via EPIDURAL

## 2014-12-18 MED ORDER — LACTATED RINGERS IV SOLN
500.0000 mL | INTRAVENOUS | Status: DC | PRN
  Administered 2014-12-18: 1000 mL via INTRAVENOUS

## 2014-12-18 NOTE — Progress Notes (Signed)
I assisted Maria Bosworthanya Willis RN with some questions, by Orlan LeavensViria Alvarez, Spanish Interpreter

## 2014-12-18 NOTE — Progress Notes (Signed)
Pt escorted to MAU for labor evaluation.

## 2014-12-18 NOTE — Progress Notes (Signed)
Ninfa MeekerJudy Lowe RN [facility]

## 2014-12-18 NOTE — Progress Notes (Signed)
Dr Wende MottMcKeag in to see pt with Maria Riley- Spanish interpreter.

## 2014-12-18 NOTE — H&P (Signed)
Chief Complaint:  Labor Eval  HPI: Maria Riley is a 24 y.o. G2P0101 at [redacted]w[redacted]d who presents to maternity admissions reporting contractions and abdominal discomfort x4days. Patient reports new mucus-like vaginal discharge during this time. Contractions have become progressively stronger and more uncomfortable since onset.  Patient denies significant leakage of fluid or vaginal bleeding. Good fetal movement. No dysuria. No fevers, chills, HA, vision changes.  Pregnancy Course:   Past Medical History: Past Medical History  Diagnosis Date  . Medical history non-contributory     Past obstetric history: OB History  Gravida Para Term Preterm AB SAB TAB Ectopic Multiple Living  2 1 0 1      1    # Outcome Date GA Lbr Len/2nd Weight Sex Delivery Anes PTL Lv  2 Current           1 Preterm 03/18/11 [redacted]w[redacted]d   M Vag-Spont         Past Surgical History: History reviewed. No pertinent past surgical history.   Family History: Family History  Problem Relation Age of Onset  . Alcohol abuse Neg Hx   . Arthritis Neg Hx   . Asthma Neg Hx   . Birth defects Neg Hx   . Cancer Neg Hx   . COPD Neg Hx   . Depression Neg Hx   . Diabetes Neg Hx   . Drug abuse Neg Hx   . Early death Neg Hx   . Hearing loss Neg Hx   . Heart disease Neg Hx   . Hyperlipidemia Neg Hx   . Hypertension Neg Hx   . Kidney disease Neg Hx   . Learning disabilities Neg Hx   . Mental illness Neg Hx   . Mental retardation Neg Hx   . Miscarriages / Stillbirths Neg Hx   . Stroke Neg Hx   . Vision loss Neg Hx   . Varicose Veins Neg Hx     Social History: History  Substance Use Topics  . Smoking status: Never Smoker   . Smokeless tobacco: Never Used  . Alcohol Use: No    Allergies: No Known Allergies  Meds:  Prescriptions prior to admission  Medication Sig Dispense Refill Last Dose  . Prenatal Vit-Fe Fumarate-FA (MULTIVITAMIN-PRENATAL) 27-0.8 MG TABS tablet Take 1 tablet by mouth daily at 12 noon.    12/17/2014 at Unknown time    ROS: Pertinent findings in history of present illness.  Physical Exam  Blood pressure 100/63, pulse 79, temperature 97.7 F (36.5 C), temperature source Oral, resp. rate 18, last menstrual period 04/08/2014. GENERAL: Well-developed, well-nourished female in no acute distress. Slight diaphoresis. Spanish speaking. HEENT: normocephalic HEART: normal rate RESP: normal effort ABDOMEN: Soft, non-tender, gravid appropriate for gestational age; discomfort in lower quadrants and low back during contractions EXTREMITIES: Nontender, no edema NEURO: alert and oriented    FHT:  Baseline 135 , moderate variability, accelerations present, no decelerations Contractions: q 4-8 mins   Labs: No results found for this or any previous visit (from the past 24 hour(s)).  Imaging:  US Ob Comp + 14 Wk  12/06/2014   OBSTETRICAL ULTRASOUND: This exam was performed within a [facility]. The OB US report was generated in the AS system, and faxed to the ordering physician.   This report is available in the [employer]. See the [facility] Obstetric US report via the Image Link.   Assessment: 1. Labor: preterm; spontaneous; progressive 2. Fetal Wellbeing: Category 1  3. Pain Control: would like  epidural 4. GBS: unknown; PCR pending 5. 6857w2d IUP  Plan:  1. Admit to BS per consult with MD 2. Routine L&D orders 3. Analgesia/anesthesia PRN       Medication List    ASK your doctor about these medications        multivitamin-prenatal 27-0.8 MG Tabs tablet  Take 1 tablet by mouth daily at 12 noon.        Kathee DeltonIan D McKeag, MD 12/18/2014 12:08 PM  OB fellow attestation:  I have seen and examined this patient; I agree with above documentation in the resident's note.   Maria Riley is a 24 y.o. [phone] here for in preterm labor  PE: BP 98/56 mmHg  Pulse 79  Temp(Src) 98.4 F (36.9 C) (Oral)  Resp 20  Ht 4\' 11"  (1.499 m)  Wt 143 lb (64.864 kg)   BMI 28.87 kg/m2  LMP 04/08/2014 (Exact Date) Gen: calm comfortable, NAD Resp: normal effort, no distress Abd: gravid  ROS, labs, PMH reviewed  Plan: Will not augment unless augmentation indicated GBS PCR, tx if positive If cervix unchanged would consider discharge.  Maria Riley Maria Riley 12/18/2014, 3:52 PM

## 2014-12-18 NOTE — MAU Note (Signed)
Pt sent up from clinic, states she is 5 cm's.  Denies bleeding or LOF.  uc's & pressure for the past 4 days.

## 2014-12-18 NOTE — Progress Notes (Signed)
I assisted Judy, RN with questions.  Eda H Royal  Interpreter. °

## 2014-12-18 NOTE — Anesthesia Procedure Notes (Signed)
Epidural Patient location during procedure: OB Start time: 12/18/2014 9:13 PM  Staffing Anesthesiologist: Brayton CavesJACKSON, Darius Fillingim Performed by: anesthesiologist   Preanesthetic Checklist Completed: patient identified, site marked, surgical consent, pre-op evaluation, timeout performed, IV checked, risks and benefits discussed and monitors and equipment checked  Epidural Patient position: sitting Prep: site prepped and draped and DuraPrep Patient monitoring: continuous pulse ox and blood pressure Approach: midline Location: L2-L3 Injection technique: LOR air  Needle:  Needle type: Tuohy  Needle gauge: 17 G Needle length: 9 cm and 9 Needle insertion depth: 5 cm cm Catheter type: closed end flexible Catheter size: 19 Gauge Catheter at skin depth: 10 cm Test dose: negative  Assessment Events: blood not aspirated, injection not painful, no injection resistance, negative IV test and no paresthesia  Additional Notes Patient identified.  Risk benefits discussed including failed block, incomplete pain control, headache, nerve damage, paralysis, blood pressure changes, nausea, vomiting, reactions to medication both toxic or allergic, and postpartum back pain.  Patient expressed understanding and wished to proceed.  All questions were answered.  Sterile technique used throughout procedure and epidural site dressed with sterile barrier dressing. No paresthesia or other complications noted.The patient did not experience any signs of intravascular injection such as tinnitus or metallic taste in mouth nor signs of intrathecal spread such as rapid motor block. Please see nursing notes for vital signs.

## 2014-12-18 NOTE — Progress Notes (Signed)
C/o of pelvic pressure.  Interpreter Shelah LewandowskyAdrianna Martinez used for this encounter.

## 2014-12-18 NOTE — Progress Notes (Signed)
I assisted Dr. Lora PaulaMeag with explanation of care plan.  Eda H Royal  Interpreter.

## 2014-12-18 NOTE — Progress Notes (Signed)
I assisted RN with questions.  Maria Riley  Interpreter. °

## 2014-12-18 NOTE — Anesthesia Preprocedure Evaluation (Signed)
Anesthesia Evaluation  Patient identified by MRN, date of birth, ID band Patient awake    Reviewed: Allergy & Precautions, H&P , Patient's Chart, lab work & pertinent test results  Airway Mallampati: II  TM Distance: >3 FB Neck ROM: full    Dental   Pulmonary  breath sounds clear to auscultation        Cardiovascular Rhythm:regular Rate:Normal     Neuro/Psych    GI/Hepatic   Endo/Other    Renal/GU      Musculoskeletal   Abdominal   Peds  Hematology   Anesthesia Other Findings   Reproductive/Obstetrics (+) Pregnancy                             Anesthesia Physical Anesthesia Plan  ASA: II  Anesthesia Plan: Epidural   Post-op Pain Management:    Induction:   Airway Management Planned:   Additional Equipment:   Intra-op Plan:   Post-operative Plan:   Informed Consent: I have reviewed the patients History and Physical, chart, labs and discussed the procedure including the risks, benefits and alternatives for the proposed anesthesia with the patient or authorized representative who has indicated his/her understanding and acceptance.     Plan Discussed with:   Anesthesia Plan Comments:         Anesthesia Quick Evaluation  

## 2014-12-18 NOTE — Progress Notes (Signed)
Mrs. Maria Riley is a 24 y.o. [phone] presenting for prenatal visit at 7267w2d. Medical history is significant for premature labor at 34 weeks and late onset of prenatal care for the current pregnancy. Today reporting pelvic pressure and discomfort x4d, and some blood-tinged mucus vaginal D/C. No regular contractions or leaking fluid. Patient denies any other problems or concerns.  Collected GC/CH and GBS swabs today.   Per cervical exam pt is 5/50/-3, vortex, and is being sent to MAU for observation and monitoring to r/o PTL.   [name], Marlana Salvagenna M PA Student 12/18/2014 10:49 AM

## 2014-12-18 NOTE — Progress Notes (Signed)
I have seen this patient and agree with the above PA student's note.  Shelah LewandowskyAdrianna Martinez used for all communication.    LEFTWICH-KIRBY, Taite Schoeppner Certified Nurse-Midwife

## 2014-12-19 ENCOUNTER — Encounter (HOSPITAL_COMMUNITY): Payer: Self-pay

## 2014-12-19 DIAGNOSIS — Z3A36 36 weeks gestation of pregnancy: Secondary | ICD-10-CM

## 2014-12-19 LAB — GC/CHLAMYDIA PROBE AMP
CT Probe RNA: NEGATIVE
GC Probe RNA: NEGATIVE

## 2014-12-19 LAB — POCT URINALYSIS DIP (DEVICE)
Bilirubin Urine: NEGATIVE
GLUCOSE, UA: NEGATIVE mg/dL
HGB URINE DIPSTICK: NEGATIVE
Ketones, ur: NEGATIVE mg/dL
Leukocytes, UA: NEGATIVE
Nitrite: NEGATIVE
PH: 7 (ref 5.0–8.0)
Protein, ur: NEGATIVE mg/dL
Specific Gravity, Urine: 1.015 (ref 1.005–1.030)
Urobilinogen, UA: 0.2 mg/dL (ref 0.0–1.0)

## 2014-12-19 LAB — RPR: RPR: NONREACTIVE

## 2014-12-19 MED ORDER — LANOLIN HYDROUS EX OINT: TOPICAL_OINTMENT | CUTANEOUS | Status: DC | PRN

## 2014-12-19 MED ORDER — IBUPROFEN 600 MG PO TABS
600.0000 mg | ORAL_TABLET | Freq: Four times a day (QID) | ORAL | Status: DC
  Administered 2014-12-19 – 2014-12-20 (×4): 600 mg via ORAL
  Filled 2014-12-19 (×4): qty 1

## 2014-12-19 MED ORDER — SIMETHICONE 80 MG PO CHEW: 80.0000 mg | CHEWABLE_TABLET | ORAL | Status: DC | PRN

## 2014-12-19 MED ORDER — TETANUS-DIPHTH-ACELL PERTUSSIS 5-2.5-18.5 LF-MCG/0.5 IM SUSP
0.5000 mL | Freq: Once | INTRAMUSCULAR | Status: AC
  Administered 2014-12-20: 0.5 mL via INTRAMUSCULAR
  Filled 2014-12-19: qty 0.5

## 2014-12-19 MED ORDER — ONDANSETRON HCL 4 MG/2ML IJ SOLN: 4.0000 mg | INTRAMUSCULAR | Status: DC | PRN

## 2014-12-19 MED ORDER — DIBUCAINE 1 % RE OINT: 1.0000 "application " | TOPICAL_OINTMENT | RECTAL | Status: DC | PRN

## 2014-12-19 MED ORDER — BENZOCAINE-MENTHOL 20-0.5 % EX AERO: 1.0000 "application " | INHALATION_SPRAY | CUTANEOUS | Status: DC | PRN

## 2014-12-19 MED ORDER — DIPHENHYDRAMINE HCL 25 MG PO CAPS
25.0000 mg | ORAL_CAPSULE | Freq: Four times a day (QID) | ORAL | Status: DC | PRN
Start: 2014-12-19 — End: 2014-12-20

## 2014-12-19 MED ORDER — ZOLPIDEM TARTRATE 5 MG PO TABS: 5.0000 mg | ORAL_TABLET | Freq: Every evening | ORAL | Status: DC | PRN

## 2014-12-19 MED ORDER — ONDANSETRON HCL 4 MG PO TABS: 4.0000 mg | ORAL_TABLET | ORAL | Status: DC | PRN

## 2014-12-19 MED ORDER — WITCH HAZEL-GLYCERIN EX PADS: 1.0000 "application " | MEDICATED_PAD | CUTANEOUS | Status: DC | PRN

## 2014-12-19 MED ORDER — SENNOSIDES-DOCUSATE SODIUM 8.6-50 MG PO TABS
2.0000 | ORAL_TABLET | ORAL | Status: DC
  Administered 2014-12-20: 2 via ORAL
  Filled 2014-12-19: qty 2

## 2014-12-19 MED ORDER — PRENATAL MULTIVITAMIN CH
1.0000 | ORAL_TABLET | Freq: Every day | ORAL | Status: DC
  Administered 2014-12-20: 1 via ORAL
  Filled 2014-12-19: qty 1

## 2014-12-19 NOTE — Progress Notes (Signed)
Stopped by to check on patient's needs and ordered her Breakfast, also assisted RN from nursery with explanation of care plan.  Eda H Royal  Interpreter.

## 2014-12-19 NOTE — Progress Notes (Signed)
UR chart review completed.  

## 2014-12-19 NOTE — Progress Notes (Signed)
I assisted Mikle Bosworthanya Willis RN with questions, I was present during a rupture with Dr Jarvis NewcomerGrunz, by Orlan LeavensViria Alvarez Spanish Interpreter

## 2014-12-19 NOTE — Progress Notes (Signed)
Notes during computer downtime can be found on paper charting.

## 2014-12-19 NOTE — Lactation Note (Signed)
This note was copied from the chart of Maria Riley. Lactation Consultation Note  Patient Name: Maria Henry RusselLinda Riley RUEAV'WToday's Date: 12/19/2014 Reason for consult: Initial assessment;Infant < 6lbs;Late preterm infant born at 36 weeks and 3 days and weight 5 pounds 5 oz. This is mom's second child.  LC able to speak Spanish and mom informs LC that she breastfed first baby for 3 months.  She denies latching problems with her newborn who is both breastfeeding and receiving formula supplement due to weight <6 pounds and LPI status.  DEBP is at bedside but mom says her nurse has not yet shown her how to use.  LC encouraged mom to pump for 15 minutes q3h for stimulation of milk supply as long as baby is receiving supplement.  LC encouraged her to feed baby q3h and more often if baby is cuing.  RN has documented showing mom hand expression.  Mom encouraged to feed baby 8-12 times/24 hours and with feeding cues. LC encouraged review of Baby and Me pp 13-16 for review of BF information in [place].LC provided [employer] Resource brochure in Spanish, and reviewed WH services and list of community and web site resources, especially [facility] website which has information available in [place]..   Maternal Data Formula Feeding for Exclusion: Yes Reason for exclusion: Mother's choice to formula and breast feed on admission Has patient been taught Hand Expression?: Yes (documented by RN on teaching screen of mom's chart) Does the patient have breastfeeding experience prior to this delivery?: Yes  Feeding Feeding Type: Breast Fed  LATCH Score/Interventions Latch: Repeated attempts needed to sustain latch, nipple held in mouth throughout feeding, stimulation needed to elicit sucking reflex. Intervention(s): Adjust position;Assist with latch;Breast massage  Audible Swallowing: A few with stimulation Intervention(s): Skin to skin  Type of Nipple: Everted at rest and after stimulation  Comfort (Breast/Nipple): Soft /  non-tender     Hold (Positioning): Assistance needed to correctly position infant at breast and maintain latch. Intervention(s): Breastfeeding basics reviewed;Support Pillows;Position options  LATCH Score: 7  Lactation Tools Discussed/Used Initiated by:: already at bedside by RN but mom needs teaching Date initiated:: 12/19/14 STS, hand expression, cue feedings but at least q3h  Consult Status Consult Status: Follow-up Date: 12/20/14 Follow-up type: In-patient    Warrick ParisianBryant, Baily Serpe [facility] 12/19/2014, 11:04 PM

## 2014-12-19 NOTE — Progress Notes (Signed)
Maria ChapelLinda Riley Maria Riley is a 24 y.o. G2P0101 at 4278w3d admitted for Preterm labor  Subjective: Feeling comfortable with epidural, feeling no urge to push. Head descending to +2 with UC's.   Objective: BP 100/61 mmHg  Pulse 80  Temp(Src) 97.9 F (36.6 C) (Oral)  Resp 18  Ht 4\' 11"  (1.499 m)  Wt 64.864 kg (143 lb)  BMI 28.87 kg/m2  SpO2 99%  LMP 04/08/2014 (Exact Date)      FHT:  FHR: 145 bpm, variability: moderate,  accelerations:  Present,  decelerations:  Absent UC:   irregular, every 2-4 minutes SVE:   Dilation: 8.5 Effacement (%): 80 Station: -2, -1 Exam by:: TWillis RNC  Labs: Lab Results  Component Value Date   WBC 9.2 12/18/2014   HGB 12.6 12/18/2014   HCT 36.7 12/18/2014   MCV 86.4 12/18/2014   PLT 206 12/18/2014    Assessment / Plan: Spontaneous preterm labor, progressing normally  Labor: Progressing normally, AROM with clear fluid without complication Preeclampsia:  no signs or symptoms of toxicity Fetal Wellbeing:  Category I Pain Control:  Epidural I/D:  n/a Anticipated MOD:  NSVD  Hazeline JunkerGrunz, Jessie Cowher 12/19/2014, 5:24 AM

## 2014-12-19 NOTE — Progress Notes (Signed)
I was present during the labor and delivery with Mikle Bosworthanya Willis RN and Dr Hazeline Junkeryan Grunz, by Orlan LeavensViria Alvarez, Spanish Interpreter

## 2014-12-19 NOTE — Progress Notes (Signed)
I stopped by to check on patient's needs and ordered her breakfast. Maria Riley Interpreter. °

## 2014-12-20 ENCOUNTER — Encounter (HOSPITAL_COMMUNITY): Payer: Self-pay | Admitting: Family Medicine

## 2014-12-20 LAB — CBC
HCT: 29.5 % — ABNORMAL LOW (ref 36.0–46.0)
Hemoglobin: 10.1 g/dL — ABNORMAL LOW (ref 12.0–15.0)
MCH: 30.2 pg (ref 26.0–34.0)
MCHC: 34.2 g/dL (ref 30.0–36.0)
MCV: 88.3 fL (ref 78.0–100.0)
Platelets: 160 10*3/uL (ref 150–400)
RBC: 3.34 MIL/uL — ABNORMAL LOW (ref 3.87–5.11)
RDW: 13.1 % (ref 11.5–15.5)
WBC: 8.5 10*3/uL (ref 4.0–10.5)

## 2014-12-20 LAB — CULTURE, BETA STREP (GROUP B ONLY)

## 2014-12-20 MED ORDER — INFLUENZA VAC SPLIT QUAD 0.5 ML IM SUSY
0.5000 mL | PREFILLED_SYRINGE | INTRAMUSCULAR | Status: AC
  Administered 2014-12-20: 0.5 mL via INTRAMUSCULAR
  Filled 2014-12-20: qty 0.5

## 2014-12-20 MED ORDER — OXYCODONE-ACETAMINOPHEN 5-325 MG PO TABS: 2.0000 | ORAL_TABLET | ORAL | Status: DC | PRN

## 2014-12-20 MED ORDER — OXYCODONE-ACETAMINOPHEN 5-325 MG PO TABS
1.0000 | ORAL_TABLET | ORAL | Status: DC | PRN
  Administered 2014-12-20: 1 via ORAL

## 2014-12-20 NOTE — Discharge Summary (Signed)
  Obstetric Discharge Summary Reason for Admission: onset of labor Prenatal Procedures: none Intrapartum Procedures: spontaneous vaginal delivery Postpartum Procedures: none Complications-Operative and Postpartum: none   Delivery Note At 06:15AM a viable female was delivered over an intact perineum via NSVD (Presentation: LOA). APGAR: 9, 9; weight pending at time of note. Infant placed immediately on maternal abdomen. Cord clamped x2, cut by friend of mother. Cord blood obtained. Pitocin infusion was begun and an intact placenta with 3VC was delivered shortly thereafter with continuous cord tract without complication.   Anesthesia: Epidural  Episiotomy: None Lacerations: None Suture repair: None Estimated blood loss: 350cc  Mom to postpartum. Baby to couplet care/skin-to-skin.  Dr. Loreta AveAcosta was present throughout delivery.   Maria B. Jarvis NewcomerGrunz, MD, PGY-2 12/19/2014 6:41 AM    Hospital Course:  Active Problems:   Preterm labor   Maria Riley is a 24 y.o. [id number] s/p preterm SVD.  Patient presented to [facility] in labor.  She has postpartum course that was uncomplicated including no problems with ambulating, PO intake, urination, pain, or bleeding. The pt feels ready to go home and  will be discharged with outpatient follow-up.   Today: No acute events overnight.  Pt denies problems with ambulating, voiding or po intake.  She denies nausea or vomiting.  Pain is well controlled.  Plan for birth control is  no method.  Method of Feeding: breast   H/H: Lab Results  Component Value Date/Time   HGB 10.1* 12/20/2014 05:55 AM   HCT 29.5* 12/20/2014 05:55 AM    Discharge Diagnoses: preterm vaginal delivery  Discharge Information: Date: 12/20/2014 Activity: pelvic rest Diet: routine  Medications: None Breast feeding:  Yes Condition: stable Instructions: refer to handout Discharge to: home   Discharge Instructions    Call MD for:  redness, tenderness, or signs of  infection (pain, swelling, redness, odor or green/yellow discharge around incision site)    Complete by:  As directed      Call MD for:  severe uncontrolled pain    Complete by:  As directed      Call MD for:  temperature >100.4    Complete by:  As directed      Diet - low sodium heart healthy    Complete by:  As directed             Medication List    ASK your doctor about these medications        multivitamin-prenatal 27-0.8 MG Tabs tablet  Take 1 tablet by mouth daily at 12 noon.           Follow-up Information    Follow up with [facility] In 5 weeks.   Contact information:   [address] [place] [address] [phone]       Perry MountACOSTA,Trenice Mesa [name] ,MD OB Fellow 12/20/2014,9:55 AM

## 2014-12-20 NOTE — Progress Notes (Signed)
Interpreter used to explain PKU, Hearing screen, heart screen, and feedings.

## 2014-12-20 NOTE — Progress Notes (Signed)
I assisted Jan RN with some questions about PKU, Heart Screening and Hearing Screening for the baby, by Orlan LeavensViria Alvarez Spanish Interpreter

## 2014-12-20 NOTE — Progress Notes (Signed)
I stopped by to check on patient's needs.  Eda H Royal Interpreter. °

## 2014-12-20 NOTE — Anesthesia Postprocedure Evaluation (Signed)
  Anesthesia Post-op Note Translator, Eda, at bedside during anesthesia post-op interview.  Patient: Maria ChapelLinda Riley Xxxdejesus  Procedure(s) Performed: * No procedures listed *  Patient Location: Mother/Baby  Anesthesia Type:Epidural  Level of Consciousness: awake, alert , oriented and patient cooperative  Airway and Oxygen Therapy: Patient Spontanous Breathing  Post-op Pain: mild  Post-op Assessment: Post-op Vital signs reviewed, Patient's Cardiovascular Status Stable, Respiratory Function Stable, Patent Airway, No headache, No backache, No residual numbness and No residual motor weakness  Post-op Vital Signs: Reviewed and stable  Last Vitals:  Filed Vitals:   12/20/14 0531  BP: 96/53  Pulse: 99  Temp: 36.5 C  Resp: 18    Complications: No apparent anesthesia complications

## 2014-12-20 NOTE — Progress Notes (Signed)
I assisted Dr. Erlinda HongMc Cormick with explanation of care plan for the Baby. Eda H Royal Interpreter.

## 2014-12-20 NOTE — Progress Notes (Signed)
I stopped by patient room to check on her needs, by Orlan LeavensViria Alvarez Spanish Interpreter

## 2014-12-20 NOTE — Progress Notes (Signed)
I ordered patient's lunch.  Maria Riley  Interpreter. °

## 2014-12-20 NOTE — Progress Notes (Signed)
,  I assisted Deven.RN with teaching. Kipp LaurenceEda H Royal  Interpreter

## 2014-12-20 NOTE — Discharge Instructions (Signed)
Parto vaginal, Cuidados posteriores  °(Vaginal Delivery, Care After) °Siga estas instrucciones durante las próximas semanas. Estas indicaciones para el alta le proporcionan información general acerca de cómo deberá cuidarse después del parto. El médico también podrá darle instrucciones específicas. El tratamiento ha sido planificado según las prácticas médicas actuales, pero en algunos casos pueden ocurrir problemas. Comuníquese con el médico si tiene algún problema o tiene preguntas al volver a su casa.  °INSTRUCCIONES PARA EL CUIDADO EN EL HOGAR  °· Tome sólo medicamentos de venta libre o recetados, según las indicaciones del médico o del farmacéutico. °· No beba alcohol, especialmente si está amamantando o toma analgésicos. °· No mastique tabaco ni fume. °· No consuma drogas. °· Continúe con un adecuado cuidado perineal. El buen cuidado perineal incluye: °¨ Higienizarse de adelante hacia atrás. °¨ Mantener la zona perineal limpia. °· No use tampones ni duchas vaginales hasta que su médico la autorice. °· Dúchese, lávese el cabello y tome baños de inmersión según las indicaciones de su médico. °· Utilice un sostén que le ajuste bien y que brinde buen soporte a sus mamas. °· Consuma alimentos saludables. °· Beba suficiente líquido para mantener la orina clara o de color amarillo pálido. °· Consuma alimentos ricos en fibra como cereales y panes integrales, arroz, frijoles y frutas y verduras frescas todos los días. Estos alimentos pueden ayudarla a prevenir o aliviar el estreñimiento. °· Siga las recomendaciones de su médico relacionadas con la reanudación de actividades como subir escaleras, conducir automóviles, levantar objetos, hacer ejercicios o viajar. °· Hable con su médico acerca de reanudar la actividad sexual. Volver a la actividad sexual depende del riesgo de infección, la velocidad de la curación y la comodidad y su deseo de reanudarla. °· Trate de que alguien la ayude con las actividades del hogar y con  el recién nacido al menos durante un par de días después de salir del hospital. °· Descanse todo lo que pueda. Trate de descansar o tomar una siesta mientras el bebé está durmiendo. °· Aumente sus actividades gradualmente. °· Cumpla con todas las visitas de control programadas para después del parto. Es muy importante asistir a todas las citas programadas de seguimiento. En estas citas, su médico va a controlarla para asegurarse de que esté sanando física y emocionalmente. °SOLICITE ATENCIÓN MÉDICA SI:  °· Elimina coágulos grandes por la vagina. Guarde algunos coágulos para mostrarle al médico. °· Tiene una secreción con feo olor que proviene de la vagina. °· Tiene dificultad para orinar. °· Orina con frecuencia. °· Siente dolor al orinar. °· Nota un cambio en sus movimientos intestinales. °· Aumenta el enrojecimiento, el dolor o la hinchazón en la zona de la incisión vaginal (episiotomía) o el desgarro vaginal. °· Tiene pus que drena por la episiotomía o el desgarro vaginal. °· La episiotomía o el desgarro vaginal se abren. °· Sus mamas le duelen, están duras o enrojecidas. °· Sufre un dolor intenso de cabeza. °· Tiene visión borrosa o ve manchas. °· Se siente triste o deprimida. °· Tiene pensamientos acerca de lastimarse o dañar al recién nacido. °· Tiene preguntas acerca de su cuidado personal, el cuidado del recién nacido o acerca de los medicamentos. °· Se siente mareada o sufre un desmayo. °· Tiene una erupción. °· Tiene náuseas o vómitos. °· Usted amamantó al bebé y no ha tenido su período menstrual dentro de las 12 semanas después de dejar de amamantar. °· No amamanta al bebé y no tuvo su período menstrual en las últimas 12° semanas después del   parto. °· Tiene fiebre. °SOLICITE ATENCIÓN MÉDICA DE INMEDIATO SI:  °· Siente dolor persistente. °· Siente dolor en el pecho. °· Le falta el aire. °· Se desmaya. °· Siente dolor en la pierna. °· Siente dolor en el estómago. °· El sangrado vaginal satura dos o más  apósitos en 1 hora. °ASEGÚRESE DE QUE:  °· Comprende estas instrucciones. °· Controlará su enfermedad. °· Recibirá ayuda de inmediato si no mejora o si empeora. °Document Released: 11/23/2005 Document Revised: 07/26/2013 °ExitCare® Patient Information ©2015 [employer]. This information is not intended to replace advice given to you by your health care provider. Make sure you discuss any questions you have with your health care provider. ° °

## 2014-12-21 ENCOUNTER — Ambulatory Visit: Payer: Self-pay

## 2014-12-21 NOTE — Lactation Note (Signed)
This note was copied from the chart of Maria Bonita QuinLinda Xxxdejesus. Lactation Consultation Note  Patient Name: Maria Riley Reason for consult: Follow-up assessment;Infant < 6lbs;Late preterm infant Re-check on Mom regarding engorgement earlier today. At this visit, breasts still engorged, firm throughout. Mom reports baby last BF around 5:00. Mom reports she has been post pumping but breasts fill back up.  Mom applying warm compress. LC attempted to hand express but only able to obtain few drops from each breast. Used DEBP and received small amount from each breast but unable to get milk flow moving. Massage painful to Mom. Applied ice packs and advised Mom to keep ice on breast for 30 minutes, then to pump. Report given to evening LC to re-evaluate Mom within an hour to see if milk flow is moving and re-check latch with baby. [name], the Spanish interpreter present for visit.   Maternal Data    Feeding Feeding Type: Breast Fed Length of feed: 30 min  LATCH Score/Interventions          Comfort (Breast/Nipple): Engorged, cracked, bleeding, large blisters, severe discomfort Problem noted: Engorgment Intervention(s): Ice           Lactation Tools Discussed/Used Tools: Pump;Nipple Shields;Comfort gels Nipple shield size: 20 Breast pump type: Double-Electric Breast Pump   Consult Status Consult Status: Follow-up Date: 12/21/14 Follow-up type: In-patient    Maria Riley, Maria Riley Riley, 6:49 PM

## 2014-12-21 NOTE — Lactation Note (Addendum)
This note was copied from the chart of Maria Bonita QuinLinda Xxxdejesus. Lactation Consultation Note Follow up visit at 61 hours of age.  Mom is pumping with about 20mls total of colostrum in bottles.  Mom is hard to communicate with even with Spanish interpreter assisting.  Mom has been pumping and adding to each bottle each time she pumps, unsure of how much collected this time.  Mom first reports baby is eating every 15 minutes, but then agrees with charting of about every 2 hours.  This type of communication is not clear from mom.  Encouraged mom to fill out feeding log.  No support person will be with her during the night.  Breast are very full and engorged increased flange size to #27 with improved comfort.  Warm wet wash cloth applied to breast for warmth.  Little help after extensive massage and hand expression.  Attempted to latch baby without nipple shield baby licked and acted confused.  Several attempts for baby to latch with Nipple shield and then fed for 25 minutes on right breast with minimal softening noted.  Cabbage leaves applied to breast after pumping.  Written English instructions given for engorgement and reviewed, interpreter not present for that, but encouraged mom to call for clarification as needed.  Mom will ice every 2 hours and soften breast for baby to latch, encouraged mom to only use her breast milk, not formula.  [facility] and mom will need follow up prior to discharge or as needed.      Patient Name: Maria Henry RusselLinda Xxxdejesus JJOAC'ZToday's Date: 12/21/2014 Reason for consult: Follow-up assessment;Difficult latch;Infant < 6lbs;Late preterm infant   Maternal Data    Feeding Feeding Type: Bottle Fed - Breast Milk Length of feed: 30 min  LATCH Score/Interventions Latch: Grasps breast easily, tongue down, lips flanged, rhythmical sucking.  Audible Swallowing: A few with stimulation Intervention(s): Hand expression  Type of Nipple: Everted at rest and after  stimulation  Comfort (Breast/Nipple): Engorged, cracked, bleeding, large blisters, severe discomfort Problem noted: Engorgment Intervention(s): Ice     Hold (Positioning): No assistance needed to correctly position infant at breast.  LATCH Score: 7  Lactation Tools Discussed/Used Tools: Pump Nipple shield size: 20 Breast pump type: Double-Electric Breast Pump   Consult Status Consult Status: Follow-up Date: 12/22/14 Follow-up type: In-patient    Maria Riley, Maria Riley 12/21/2014, 8:50 PM

## 2014-12-21 NOTE — Lactation Note (Signed)
This note was copied from the chart of Maria Riley. Lactation Consultation Note  Patient Name: Maria Riley WUJWJ'XToday's Date: 12/21/2014 Reason for consult: Follow-up assessment;Infant < 6lbs;Late preterm infant [place] interpreter [id number] used for visit. Mom reports breast tenderness with BF. She has not used her breast pump since yesterday. She has been supplementing with formula. On exam, Mom is engorged, nipple and aerola are firm. Mom pre-pumped to soften aerola. LC assisted Mom to get baby latched to left breast. Several attempts needed for baby to sustain the latch. Baby has difficulty sustaining good depth, Mom breasts softened but still not empty after baby BF. Compression line visible on left nipple when baby came off the breast. Pre-pumped right breast to help with latch, however baby having difficulty sustaining depth on right nipple as well. Applied #20 nipple shield and Mom reports less discomfort and baby had wider gape at the breast. Mom right breast softened some with baby BF. Mom then post pumping. Reviewed with Mom how to use nipple shield, hand out given. Care for sore nipples reviewed with Mom, comfort gels given with instructions. [facility]. Plan discussed with Mom: BF whenever baby is hungry but at least every 2-3 hours, Pre-pump as needed to soften aerola, use #20 nipple shield to latch. BF both breasts 15 minutes each feeding, post pump to comfort and empty breasts, apply ice pack after pumping, continue to supplement baby per guidelines using EBM 1st then formula if needed.  Mom reports understanding, RN aware of plan.   Maternal Data    Feeding Feeding Type: Breast Fed Length of feed: 30 min  LATCH Score/Interventions Latch: Grasps breast easily, tongue down, lips flanged, rhythmical sucking. (using #20 nipple shield) Intervention(s): Adjust position;Assist with latch;Breast massage;Breast compression  Audible  Swallowing: Spontaneous and intermittent  Type of Nipple: Everted at rest and after stimulation (short shaft, swelling w/engorgement)  Comfort (Breast/Nipple): Engorged, cracked, bleeding, large blisters, severe discomfort Problem noted: Engorgment Intervention(s): Ice     Hold (Positioning): Assistance needed to correctly position infant at breast and maintain latch. Intervention(s): Breastfeeding basics reviewed;Support Pillows;Position options;Skin to skin  LATCH Score: 7  Lactation Tools Discussed/Used Tools: Nipple Shields;Pump;Comfort gels Nipple shield size: 20 Breast pump type: Double-Electric Breast Pump WIC Program: Yes   Consult Status Consult Status: Follow-up Date: 12/21/14 Follow-up type: In-patient    Maria Riley, Maria Riley 12/21/2014, 3:28 PM

## 2014-12-22 ENCOUNTER — Ambulatory Visit: Payer: Self-pay

## 2014-12-22 NOTE — Lactation Note (Signed)
This note was copied from the chart of Maria Bonita QuinLinda Riley. Lactation Consultation Note: Follow up visit with mom .She is just starting to pump as I went into room. Reports her breasts are feeling much better than yesterday, Has bottle of EBM about 20 cc's at bedside. Milk is flowing well as I left room. No questions at present. To call prn  Patient Name: Maria Riley UUVOZ'DToday's Date: 12/22/2014 Reason for consult: Follow-up assessment   Maternal Data    Feeding   LATCH Score/Interventions                      Lactation Tools Discussed/Used     Consult Status Consult Status: PRN    Pamelia HoitWeeks, Esaias Cleavenger D 12/22/2014, 10:46 AM

## 2014-12-25 ENCOUNTER — Encounter: Payer: Self-pay | Admitting: Physician Assistant

## 2015-01-02 ENCOUNTER — Telehealth: Payer: Self-pay | Admitting: *Deleted

## 2015-01-02 NOTE — Addendum Note (Signed)
Addendum  created 01/02/15 0739 by Brayton CavesFreeman Kolson Chovanec, MD   Modules edited: Anesthesia Responsible Staff

## 2015-01-02 NOTE — Telephone Encounter (Signed)
Bonita QuinLinda appeared at front office asking to speak to a nurse. Per discussion with her and review of chart-had uncomplicated vaginal birth recently.Used interpreter.  States she is having constipation- states she had a little during pregnancy, but not as bad as now. Per chart no episiotomy or lacerations.States is only using ibuprofen for pain, no pain meds. Advised her to drink a lot of water, fresh fruits/vegatables. Per discussion with provider will advise to follow protocol for constipation for OB. Advised her to start taking colace bid as needed and metamucil once a day. Then if no results after 2-3 days use Miralax. If still no results use dulcolax as last choice. Call us if needed.  Talayia voices understanding.

## 2015-01-31 ENCOUNTER — Ambulatory Visit: Payer: Medicaid Other | Admitting: Obstetrics and Gynecology

## 2016-01-29 ENCOUNTER — Telehealth: Payer: Self-pay | Admitting: *Deleted

## 2016-01-29 NOTE — Telephone Encounter (Signed)
CALLED PATIENT AND LEFT A MESSAGE FOR PATIENT TO CALL ME BACK IN  TO GO OVER THE RESULTS OF BIOPSY OF HER LEFT HEEL.Maria Riley

## 2016-11-02 ENCOUNTER — Inpatient Hospital Stay (HOSPITAL_COMMUNITY)
Admission: AD | Admit: 2016-11-02 | Discharge: 2016-11-02 | Disposition: A | Discharge status: Home / Self Care | Payer: Medicaid Other | Source: Ambulatory Visit | Attending: Obstetrics and Gynecology | Admitting: Obstetrics and Gynecology

## 2016-11-02 ENCOUNTER — Encounter (HOSPITAL_COMMUNITY): Payer: Self-pay | Admitting: *Deleted

## 2016-11-02 DIAGNOSIS — R12 Heartburn: Secondary | ICD-10-CM

## 2016-11-02 DIAGNOSIS — O219 Vomiting of pregnancy, unspecified: Secondary | ICD-10-CM

## 2016-11-02 DIAGNOSIS — O26891 Other specified pregnancy related conditions, first trimester: Secondary | ICD-10-CM | POA: Insufficient documentation

## 2016-11-02 DIAGNOSIS — Z3A08 8 weeks gestation of pregnancy: Secondary | ICD-10-CM | POA: Diagnosis not present

## 2016-11-02 DIAGNOSIS — Z3201 Encounter for pregnancy test, result positive: Secondary | ICD-10-CM | POA: Insufficient documentation

## 2016-11-02 LAB — URINALYSIS, ROUTINE W REFLEX MICROSCOPIC
BILIRUBIN URINE: NEGATIVE
Glucose, UA: NEGATIVE mg/dL
Hgb urine dipstick: NEGATIVE
KETONES UR: NEGATIVE mg/dL
LEUKOCYTES UA: NEGATIVE
NITRITE: NEGATIVE
Protein, ur: NEGATIVE mg/dL
SPECIFIC GRAVITY, URINE: 1.015 (ref 1.005–1.030)
pH: 8 (ref 5.0–8.0)

## 2016-11-02 LAB — POCT PREGNANCY, URINE: PREG TEST UR: POSITIVE — AB

## 2016-11-02 MED ORDER — PROMETHAZINE HCL 25 MG PO TABS: 25.0000 mg | ORAL_TABLET | Freq: Four times a day (QID) | ORAL | 2 refills | Status: DC | PRN

## 2016-11-02 MED ORDER — GI COCKTAIL ~~LOC~~
30.0000 mL | Freq: Once | ORAL | Status: AC
  Administered 2016-11-02: 30 mL via ORAL
  Filled 2016-11-02: qty 30

## 2016-11-02 MED ORDER — CALCIUM CARBONATE ANTACID 500 MG PO CHEW: 1.0000 | CHEWABLE_TABLET | Freq: Three times a day (TID) | ORAL | 1 refills | Status: DC | PRN

## 2016-11-02 NOTE — MAU Provider Note (Signed)
Chief Complaint: No chief complaint on file.   First Provider Initiated Contact with Patient 11/02/16 1303        SUBJECTIVE HPI: Maria Riley is a 25 y.o. [id number] at 2533w1d by LMP who presents to maternity admissions reporting epigastric pain and burning.  Also has nausea and vomiting.  Denies any lower abdominal pain. . She denies vaginal bleeding, vaginal itching/burning, urinary symptoms, h/a, dizziness, or fever/chills.    Abdominal Pain  This is a new problem. The current episode started in the past 7 days. The onset quality is gradual. The problem occurs intermittently. The problem has been unchanged. The pain is located in the epigastric region. The quality of the pain is burning. The abdominal pain does not radiate. Associated symptoms include nausea. Pertinent negatives include no constipation, diarrhea, dysuria, fever, headaches, myalgias or vomiting. The pain is aggravated by eating. The pain is relieved by nothing. She has tried nothing for the symptoms.   RN Note: Pt presents to MAU with complaints of epigastric pain with burning. Pt denies any vaginal bleeding or abnormal discharge. Reports nausea and vomiting. [facility]  Past Medical History:  Diagnosis Date  . Medical history non-contributory    History reviewed. No pertinent surgical history. Social History   Social History  . Marital status: Married    Spouse name: N/A  . Number of children: N/A  . Years of education: N/A   Occupational History  . Not on file.   Social History Main Topics  . Smoking status: Never Smoker  . Smokeless tobacco: Never Used  . Alcohol use No  . Drug use: No  . Sexual activity: Yes    Birth control/ protection: None   Other Topics Concern  . Not on file   Social History Narrative  . No narrative on file   No current facility-administered medications on file prior to encounter.    Current Outpatient Prescriptions on File Prior to Encounter  Medication  Sig Dispense Refill  . Prenatal Vit-Fe Fumarate-FA (MULTIVITAMIN-PRENATAL) 27-0.8 MG TABS tablet Take 1 tablet by mouth daily at 12 noon.     No Known Allergies  I have reviewed patient's Past Medical Hx, Surgical Hx, Family Hx, Social Hx, medications and allergies.   ROS:  Review of Systems  Constitutional: Negative for fever.  Respiratory: Negative for shortness of breath.   Gastrointestinal: Positive for abdominal pain (epigastric) and nausea. Negative for constipation, diarrhea and vomiting.  Genitourinary: Negative for dysuria, pelvic pain and vaginal bleeding.  Musculoskeletal: Negative for myalgias.  Neurological: Negative for headaches.    Other systems negative   Physical Exam  Physical Exam Patient Vitals for the past 24 hrs:  BP Temp Pulse Resp Height Weight  11/02/16 1210 97/62 98.6 F (37 C) 80 16 4\' 11"  (1.499 m) 121 lb (54.9 kg)   Constitutional: Well-developed, well-nourished female in no acute distress.  Cardiovascular: normal rate Respiratory: normal effort GI: Abd soft, non-tender. Pos BS x 4 MS: Extremities nontender, no edema, normal ROM Neurologic: Alert and oriented x 4.  GU: Neg CVAT.  PELVIC EXAM: Deferred   LAB RESULTS Results for orders placed or performed during the hospital encounter of 11/02/16 (from the past 24 hour(s))  Pregnancy, urine POC     Status: Abnormal   Collection Time: 11/02/16 12:44 PM  Result Value Ref Range   Preg Test, Ur POSITIVE (A) NEGATIVE       IMAGING No results found.  MAU Management/MDM: Since pain is entirely epigastric  and associated with eating, will try dose of GI cocktail This completely alleviated her pain. Urine shows no evidence of dehydration WIll not try Phenergan here since patient is driving Will Rx Phenergan for home use and give nausea diet.   ASSESSMENT SIUP at 278w2d Epigastric pain, likely acid reflux Nausea and vomiting  PLAN Discharge home Rx Phenergan for nausea Rx Tums for  heartburn  Pt stable at time of discharge. Encouraged to return here or to other Urgent Care/ED if she develops worsening of symptoms, increase in pain, fever, or other concerning symptoms.    Wynelle BourgeoisMarie Earlyn Sylvan CNM, MSN Certified Nurse-Midwife 11/02/2016  1:03 PM

## 2016-11-02 NOTE — MAU Note (Signed)
Pt presents to MAU with complaints of epigastric pain with burning. Pt denies any vaginal bleeding or abnormal discharge. Reports nausea and vomiting. [facility]

## 2016-11-02 NOTE — Discharge Instructions (Signed)
Opciones de alimentos para pacientes con reflujo gastroesofgico - Adultos (Food Choices for Gastroesophageal Reflux Disease, Adult) Cuando se tiene reflujo gastroesofgico (ERGE), los alimentos que se ingieren y los hbitos de alimentacin son muy importantes. Elegir los alimentos adecuados puede ayudar a [employer]. QU PAUTAS DEBO SEGUIR? Elija las frutas, los vegetales, los cereales integrales y los productos lcteos con bajo contenido de [place]. Elija las carnes de [place], de pescado y de ave con bajo contenido de grasas. Limite las grasas, [address] [place], los aderezos para [place], la [place], los frutos secos y Programme researcher, broadcasting/film/video. Lleve un registro de alimentos. Esto ayuda a identificar los alimentos que ocasionan sntomas. Evite los alimentos que le ocasionen sntomas. Pueden ser distintos para cada persona. Haga comidas pequeas durante Glass blower/designer de 3 comidas abundantes. Coma lentamente, en un lugar donde est distendido. Limite el consumo de alimentos fritos. Cocine los alimentos utilizando mtodos que no sean la fritura. Evite el consumo alcohol. Evite beber grandes cantidades de lquidos con las comidas. Evite agacharse o recostarse hasta despus de 2 o 3horas de haber comido. QU ALIMENTOS NO SE RECOMIENDAN? Estos son algunos alimentos y bebidas que pueden empeorar los sntomas: Garment/textile technologist. Jugo de tomate. Salsa de tomate y espagueti. Ajes. [place] y [place]. Rbano picante. Frutas  Naranjas, pomelos y limn (fruta y [place]). Carnes  Carnes de [place], de pescado y de ave con gran contenido de grasas. Esto incluye los perros calientes, las [place], el [place]gan, la salchicha, el salame y el tocino. Lcteos  Leche entera y [place]. [employer]. Crema. Mant[place]illa. Helados. Queso crema. Bebidas  T o caf. Bebidas gaseosas o bebidas energizantes. Condimentos  Salsa picante. Salsa barbacoa. Dulces/postres  Chocolate y cacao. Rosquillas. Menta y  mentol. Grasas y [employer]. Esto incluye las papas fritas. Otros  Vinagre. Especias picantes. Esto incluye la pimienta negra, la pimienta blanca, la pimienta roja, la pimienta de cayena, el curry en polvo, los clavos de [place], el jengibre y el [place] en polvo. Esta no es Raytheon de los alimentos y las bebidas que se Theatre stage manager. Comunquese con el nutricionista para recibir ms informacin.  Esta informacin no tiene Theme park manager el consejo del mdico. Asegrese de hacerle al mdico cualquier pregunta que tenga. Document Released: 05/24/2012 Document Revised: 12/14/2014 Document Reviewed: 09/27/2013 Elsevier Interactive Patient Education  2017 Elsevier Inc.  Enfermedad por reflujo gastroesofgico en los adultos (Gastroesophageal Reflux Disease, Adult) Normalmente, los alimentos descienden por el esfago y se depositan en el estmago para su digestin. Si una persona tiene enfermedad por reflujo gastroesofgico (ERGE), los alimentos y el cido estomacal regresan al esfago. Cuando esto ocurre, el esfago se irrita y se hincha (inflama). Con el tiempo, la ERGE puede provocar la formacin de pequeas perforaciones (lceras) en la mucosa del esfago. CUIDADOS EN EL HOGAR Dieta   Siga la dieta como se lo haya indicado el mdico. Tal vez deba evitar los siguientes alimentos y bebidas:  Caf y t (con o sin cafena).  Bebidas que contengan alcohol.  Bebidas energizantes y deportivas.  Gaseosas o refrescos.  Chocolate y cacao.  Menta y esencias de [address]  Ajo y cebollas.  Rbano picante.  Alimentos muy condimentados y cidos, como pimientos, [place] en polvo, curry en polvo, vinagre, salsas picantes y Engineer, water.  Frutas ctricas y sus jugos, como naranjas, limones y limas.  Alimentos a base de tomates, como salsa roja, [place], salsa y pizza con salsa roja.  Alimentos fritos y Lexicographergrasos, como rosquillas, papas fritas y aderezos con alto contenido de  Holiday representativegrasa.  Carnes con alto contenido de Atkinsongrasa, como hot dogs, filetes de entrecot, salchicha, jamn y tocino.  Productos lcteos con alto contenido de Crivitzgrasa, como Talcoleche entera, Grand Terracemantequilla y queso crema.  Consuma pequeas porciones de comida con ms frecuencia. Evite consumir porciones abundantes.  Evite beber mucho lquido con las comidas.  No coma durante las 2 o 3horas previas a la hora de Detroitacostarse.  No se acueste inmediatamente despus de comer.  No haga actividad fsica enseguida despus de comer. Instrucciones generales   Est atento a cualquier cambio en los sntomas.  Tome los medicamentos de venta libre y los recetados solamente como se lo haya indicado el mdico. No tome aspirina, ibuprofeno ni otros antiinflamatorios no esteroides (AINE), a menos que el mdico lo autorice.  No consuma ningn producto que contenga tabaco, lo que incluye cigarrillos, tabaco de Theatre managermascar y Administrator, Civil Servicecigarrillos electrnicos. Si necesita ayuda para dejar de fumar, consulte al mdico.  Use ropa suelta. No use nada ajustado alrededor Reynolds Americande la cintura.  Levante (eleve) unas 6pulgadas (15centmetros) la cabecera de la cama.  Intente bajar el nivel de estrs. Si necesita ayuda para hacerlo, consulte al American Expressmdico.  Si tiene sobrepeso, Media planneradelgace hasta alcanzar un peso saludable. Pregntele a su mdico cmo puede perder peso de manera segura.  Concurra a todas las visitas de control como se lo haya indicado el mdico. Esto es importante. SOLICITE AYUDA SI:  Aparecen nuevos sntomas.  Baja de Bellevillepeso y no sabe por qu.  Tiene dificultad para tragar o siente dolor al Darden Restaurantshacerlo.  Tiene sibilancias o tos que no desaparece.  Los sntomas no mejoran con Scientist, research (medical)el tratamiento.  Tiene la voz ronca. SOLICITE AYUDA DE INMEDIATO SI:  Tiene dolor en los brazos, el cuello, los Lucerne Minesmaxilares, la dentadura o la espalda.  Berenice Primasranspira, se marea o tiene sensacin de desvanecimiento.  Siente falta de aire o Journalist, newspaperdolor en el pecho.  Vomita y  el vmito es parecido a la sangre o a los granos de caf.  Pierde el conocimiento (se desmaya).  Las heces son sanguinolentas o de color negro.  No puede tragar, beber o comer. Esta informacin no tiene Theme park managercomo fin reemplazar el consejo del mdico. Asegrese de hacerle al mdico cualquier pregunta que tenga. Document Released: 12/26/2010 Document Revised: 08/14/2015 Document Reviewed: 03/20/2015 Elsevier Interactive Patient Education  2017 Elsevier Inc.  State Street Corporationuseas matinales (Morning Sickness) Se denominan nuseas matinales a las ganas de vomitar (nuseas) durante el Psychiatristembarazo. Comienza a sentir nuseas y devuelve (vomita). Siente nuseas por la [place] La Palomamaana, pero sigue sintindolas durante todo Medical laboratory scientific officerel da. Algunas mujeres experimentan nuseas intensas y no pueden detener los vmitos (hiperemesis Astoriagravdica). CUIDADOS EN EL HOGAR  Solo tome los medicamentos que le haya indicado su mdico.  Tome las multivitaminas segn las indicaciones del mdico. Tomar multivitaminas antes de quedar embarazada puede detener o disminuir el malestar de las nuseas matinales.  Coma un trozo de Cape Verdetostada seca o galletas sin sal antes de levantarse de la cama por la maana.  Coma 5 o 6 comidas pequeas por da.  Consuma alimentos blandos y secos como arroz y patatas asadas.  No beba lquidos con las comidas. Tome lquidos Altria Groupentre las comidas.  No coma alimentos fritos, grasos o muy condimentados.  Pdale a alguna persona que cocine para usted si Quest Diagnosticsel olor de las National Oilwell Varcocomidas le da nuseas o ganas de Biochemist, clinicalvomitar.  Si tiene ganas de vomitar despus de tomar las vitaminas prenatales, tmelas  a la noche o con una colacin.  Consuma protenas cuando necesite una colacin (frutas secas, yogur, queso).  Coma gelatina sin azcar de postre.  Use un brazalete para los mareos (pulsera de acupresin).  Vaya a un especialista en colocar agujas finas en determinados puntos del cuerpo (acupuntura) para que usted se sienta mejor.  No  fume.  Consiga un humidificador para Customer service managermantener el aire de su casa libre de Cherry Forkolores.  Trate de respirar aire fresco. SOLICITE AYUDA SI:  Necesita medicamentos para sentirse mejor.  Se siente mareada o sufre un desmayo.  Pierde peso. SOLICITE AYUDA DE INMEDIATO SI:  Tiene malestar estomacal y no puede dejar de vomitar.  Pierde el conocimiento (se desmaya). ASEGRESE DE QUE:  Comprende estas instrucciones.  Controlar su afeccin.  Recibir ayuda de inmediato si no mejora o si empeora. Esta informacin no tiene Theme park managercomo fin reemplazar el consejo del mdico. Asegrese de hacerle al mdico cualquier pregunta que tenga. Document Released: 11/23/2005 Document Revised: 12/14/2014 Document Reviewed: 05/10/2013 Elsevier Interactive Patient Education  2017 ArvinMeritorElsevier Inc.

## 2016-11-13 ENCOUNTER — Inpatient Hospital Stay (HOSPITAL_COMMUNITY)
Admission: AD | Admit: 2016-11-13 | Discharge: 2016-11-13 | Disposition: A | Discharge status: Home / Self Care | Payer: Medicaid Other | Source: Ambulatory Visit | Attending: Family Medicine | Admitting: Family Medicine

## 2016-11-13 ENCOUNTER — Encounter (HOSPITAL_COMMUNITY): Payer: Self-pay

## 2016-11-13 DIAGNOSIS — O26891 Other specified pregnancy related conditions, first trimester: Secondary | ICD-10-CM | POA: Insufficient documentation

## 2016-11-13 DIAGNOSIS — R1030 Lower abdominal pain, unspecified: Secondary | ICD-10-CM | POA: Insufficient documentation

## 2016-11-13 DIAGNOSIS — O99611 Diseases of the digestive system complicating pregnancy, first trimester: Secondary | ICD-10-CM | POA: Diagnosis not present

## 2016-11-13 DIAGNOSIS — Z3A09 9 weeks gestation of pregnancy: Secondary | ICD-10-CM | POA: Diagnosis not present

## 2016-11-13 DIAGNOSIS — R112 Nausea with vomiting, unspecified: Secondary | ICD-10-CM | POA: Diagnosis not present

## 2016-11-13 DIAGNOSIS — O2341 Unspecified infection of urinary tract in pregnancy, first trimester: Secondary | ICD-10-CM | POA: Diagnosis not present

## 2016-11-13 DIAGNOSIS — K219 Gastro-esophageal reflux disease without esophagitis: Secondary | ICD-10-CM | POA: Insufficient documentation

## 2016-11-13 DIAGNOSIS — O210 Mild hyperemesis gravidarum: Secondary | ICD-10-CM | POA: Diagnosis not present

## 2016-11-13 DIAGNOSIS — K117 Disturbances of salivary secretion: Secondary | ICD-10-CM | POA: Diagnosis not present

## 2016-11-13 DIAGNOSIS — N390 Urinary tract infection, site not specified: Secondary | ICD-10-CM

## 2016-11-13 LAB — URINALYSIS, ROUTINE W REFLEX MICROSCOPIC
BILIRUBIN URINE: NEGATIVE
GLUCOSE, UA: NEGATIVE mg/dL
HGB URINE DIPSTICK: NEGATIVE
KETONES UR: 20 mg/dL — AB
NITRITE: NEGATIVE
PROTEIN: NEGATIVE mg/dL
Specific Gravity, Urine: 1.025 (ref 1.005–1.030)
pH: 7 (ref 5.0–8.0)

## 2016-11-13 MED ORDER — SODIUM CHLORIDE 0.9 % IV SOLN
INTRAVENOUS | Status: DC
  Administered 2016-11-13: 17:00:00 via INTRAVENOUS

## 2016-11-13 MED ORDER — GLYCOPYRROLATE 1 MG PO TABS: 1.0000 mg | ORAL_TABLET | Freq: Three times a day (TID) | ORAL | 0 refills | Status: DC

## 2016-11-13 MED ORDER — NITROFURANTOIN MONOHYD MACRO 100 MG PO CAPS: 100.0000 mg | ORAL_CAPSULE | Freq: Two times a day (BID) | ORAL | 1 refills | Status: DC

## 2016-11-13 MED ORDER — GLYCOPYRROLATE 1 MG PO TABS
1.0000 mg | ORAL_TABLET | Freq: Once | ORAL | Status: AC
  Administered 2016-11-13: 1 mg via ORAL
  Filled 2016-11-13: qty 1

## 2016-11-13 NOTE — Discharge Instructions (Signed)
Nuseas matinales (Morning Sickness) Se denominan nuseas matinales a las ganas de vomitar (nuseas) durante el Psychiatristembarazo. Comienza a sentir nuseas y devuelve (vomita). Siente nuseas por la [place]edar Highlandsmaana, pero sigue sintindolas durante todo Medical laboratory scientific officerel da. Algunas mujeres experimentan nuseas intensas y no pueden detener los vmitos (hiperemesis [place] Carrollgravdica). CUIDADOS EN EL HOGAR  Solo tome los medicamentos que le haya indicado su mdico.  Tome las multivitaminas segn las indicaciones del mdico. Tomar multivitaminas antes de quedar embarazada puede detener o disminuir el malestar de las nuseas matinales.  Coma un trozo de Cape Verdetostada seca o galletas sin sal antes de levantarse de la cama por la maana.  Coma 5 o 6 comidas pequeas por da.  Consuma alimentos blandos y secos como arroz y patatas asadas.  No beba lquidos con las comidas. Tome lquidos Altria Groupentre las comidas.  No coma alimentos fritos, grasos o muy condimentados.  Pdale a alguna persona que cocine para usted si Quest Diagnosticsel olor de las National Oilwell Varcocomidas le da nuseas o ganas de Biochemist, clinicalvomitar.  Si tiene ganas de vomitar despus de tomar las vitaminas prenatales, tmelas a la noche o con una colacin.  Consuma protenas cuando necesite una colacin (frutas secas, yogur, queso).  Coma gelatina sin azcar de postre.  Use un brazalete para los mareos (pulsera de acupresin).  Vaya a un especialista en colocar agujas finas en determinados puntos del cuerpo (acupuntura) para que usted se sienta mejor.  No fume.  Consiga un humidificador para Customer service managermantener el aire de su casa libre de [place] Cityolores.  Trate de respirar aire fresco. SOLICITE AYUDA SI:  Necesita medicamentos para sentirse mejor.  Se siente mareada o sufre un desmayo.  Pierde peso. SOLICITE AYUDA DE INMEDIATO SI:  Tiene malestar estomacal y no puede dejar de vomitar.  Pierde el conocimiento (se desmaya). ASEGRESE DE QUE:  Comprende estas instrucciones.  Controlar su afeccin.  Recibir ayuda de  inmediato si no mejora o si empeora. Esta informacin no tiene Theme park managercomo fin reemplazar el consejo del mdico. Asegrese de hacerle al mdico cualquier pregunta que tenga. Document Released: 11/23/2005 Document Revised: 12/14/2014 Document Reviewed: 05/10/2013 Elsevier Interactive Patient Education  2017 Elsevier Inc. Polaquiuria en los adultos (Urinary Frequency, Adult) El trmino polaquiuria describe la necesidad de Geographical information systems officerorinar con mayor frecuencia que lo normal. Huntsman CorporationLas personas que padecen polaquiuria orinan, como mnimo, 8veces en 24horas, incluso cuando beben una cantidad normal de lquido. Si bien orinan con mayor frecuencia que lo habitual, la cantidad de orina total producida en un da podra ser normal. La polaquiuria tambin se conoce como miccin frecuente. CAUSAS Esta afeccin puede ser causada por lo siguiente:  Infeccin de las vas urinarias.  Obesidad.  Problemas de vejiga, como clculos en la vejiga.  Cafena y alcohol.  Comer alimentos y beber lquidos que irritan la vejiga. Por ejemplo, caf, t, gaseosas, endulzantes artificiales, ctricos, alimentos preparados a base de tomate y chocolate.  Determinados medicamentos, como los Chesapeake Energymedicamentos que ayudan a que el cuerpo elimine el lquido extra (diurticos).  Debilidad muscular o nerviosa.  Vejiga hiperactiva.  Diabetes crnica.  Cistitis intersticial.  En los hombres, problemas en la prstata, como agrandamiento de la prstata.  En las mujeres, el Olgaembarazo. En algunos casos, es posible que la causa no se conozca. FACTORES DE RIESGO Es ms probable que esta afeccin se manifieste en:  Las mujeres que estn cursando la menopausia.  Los hombres con problemas de Retail buyerprstata.  Las personas que tienen una enfermedad o lesin que Colgate Palmoliveafecta los nervios de la mdula espinal.  Las personas que tienen  o han tenido una afeccin que afecta el cerebro, como ictus. SNTOMAS Los sntomas de esta afeccin incluyen lo siguiente:  Paediatric nurseentir  con frecuencia una necesidad urgente de Geographical information systems officerorinar. El estrs y la ansiedad por la necesidad de Clinical research associateencontrar un bao rpidamente pueden hacer que esta urgencia empeore.  Orinar 8 o ms veces en 24horas.  Orinar cada 1 o 2horas. DIAGNSTICO Esta afeccin se diagnostica en funcin de los sntomas, los antecedentes mdicos y un examen fsico. Pueden hacerle estudios, por ejemplo:  Anlisis de Renovasangre.  Anlisis de Comorosorina.  Estudios de diagnstico por imgenes, como radiografas o ecografas.  Estudio de vejiga.  Estudio del Copywriter, advertisingsistema neurolgico. Este es el sistema corporal que detecta la necesidad de Maysvilleorinar.  Neomia DearUna prueba para detectar problemas en la uretra y la vejiga llamada cistoscopia. Tambin se le podra pedir que lleve un diario de micciones. Un diario de micciones es un registro de lo que come y bebe, la frecuencia con la que orina y la cantidad. Es posible que tenga que visitar a un mdico que se Sports coachespecialice en afecciones de las vas urinarias (urlogo) o de los riones (nefrlogo). TRATAMIENTO El tratamiento de esta afeccin depende de la causa. A veces, la afeccin desaparece por s sola y el tratamiento no es necesario. En caso de ser necesario, el tratamiento podra consistir en lo siguiente:  Tomar medicamentos.  Ejercicios de aprendizaje que Brunswick Corporationfortalecen los msculos que ayudan a Brewing technologistcontrolar la miccin.  Seguir un programa de educacin del esfnter vesical. Esto puede incluir lo siguiente:  Aprender a Engineer, manufacturingretrasar las ganas de ir al bao.  Orinar dos veces seguidas (vaciamiento de la vejiga). Esta tcnica es til cuando no vaca la vejiga de Emergency planning/management officerforma completa.  Miccin programada.  Realizar cambios en la dieta, tales como:  Garment/textile technologistvitar la cafena.  Beber menor cantidad de lquidos, especialmente alcohol.  No beber por la noche.  No comer alimentos ni beber lquidos que puedan irritar la vejiga.  Comer alimentos que ayudan a prevenir o Educational psychologistaliviar el estreimiento. El estreimiento puede  empeorar esta afeccin.  Tener los nervios de la vejiga estimulados. Hay dos opciones para estimular los nervios de la vejiga:  Estimulacin elctrica de los nervios de forma ambulatoria. Esto lo realiza el mdico.  Ciruga para colocar un marcapasos para la vejiga. El marcapasos ayuda a Chief Operating Officercontrolar la necesidad imperiosa de Geographical information systems officerorinar. INSTRUCCIONES PARA EL CUIDADO EN EL HOGAR  Lleve un diario de micciones si se lo indic el mdico.  Tome los medicamentos de venta libre y los recetados solamente como se lo haya indicado el mdico.  Haga ejercicios como se lo haya indicado el mdico.  Siga un programa de educacin del esfnter vesical tal como se lo haya indicado el mdico.  Realice los cambios en la dieta que sean necesarios.  Concurra a todas las visitas de control como se lo haya indicado el mdico. Esto es importante. SOLICITE ATENCIN MDICA SI:  Comienza a orinar con mayor frecuencia.  Siente dolor o irritacin al ConocoPhillipsorinar.  Observa sangre en la orina.  La orina se ve turbia.  Tiene fiebre.  Comienza a vomitar. SOLICITE ATENCIN MDICA DE INMEDIATO SI:  No puede orinar. Esta informacin no tiene Theme park managercomo fin reemplazar el consejo del mdico. Asegrese de hacerle al mdico cualquier pregunta que tenga. Document Released: 08/17/2012 Document Revised: 06/19/2015 Document Reviewed: 06/19/2015 Elsevier Interactive Patient Education  2017 ArvinMeritorElsevier Inc.

## 2016-11-13 NOTE — MAU Note (Signed)
Patient presents with lower abdominal pain since yesterday, vomiting, has vomited more than a10 times today, vomiting has persisted for a month.

## 2016-11-13 NOTE — MAU Provider Note (Signed)
Chief Complaint: Abdominal Pain and Emesis During Pregnancy   First Provider Initiated Contact with Patient 11/13/16 1641        SUBJECTIVE HPI: Maria Riley is a 25 y.o. [id number] at [id number] by LMP who presents to maternity admissions reporting lower abdominal pain, vomiting.  Was seen for acid reflux last week. States vomiting is primarily caused by excess saliva, which gags her. .Phenergan makes her sleepy She denies vaginal bleeding, vaginal itching/burning, urinary symptoms, h/a, dizziness, n/v, or fever/chills.     Abdominal Pain  This is a new problem. The current episode started in the past 7 days. The onset quality is gradual. The problem occurs intermittently. The problem has been waxing and waning. The pain is located in the LLQ and RLQ. The quality of the pain is cramping. The abdominal pain does not radiate. Associated symptoms include nausea and vomiting. Pertinent negatives include no constipation, diarrhea, fever or melena. Nothing aggravates the pain. The pain is relieved by nothing. She has tried nothing for the symptoms.   RN Note: Patient presents with lower abdominal pain since yesterday, vomiting, has vomited more than a10 times today, vomiting has persisted for a month  Past Medical History:  Diagnosis Date  . Medical history non-contributory    History reviewed. No pertinent surgical history. Social History   Social History  . Marital status: Married    Spouse name: N/A  . Number of children: N/A  . Years of education: N/A   Occupational History  . Not on file.   Social History Main Topics  . Smoking status: Never Smoker  . Smokeless tobacco: Never Used  . Alcohol use No  . Drug use: No  . Sexual activity: Yes    Birth control/ protection: None   Other Topics Concern  . Not on file   Social History Narrative  . No narrative on file   No current facility-administered medications on file prior to encounter.    Current Outpatient Prescriptions on  File Prior to Encounter  Medication Sig Dispense Refill  . Prenatal Vit-Fe Fumarate-FA (MULTIVITAMIN-PRENATAL) 27-0.8 MG TABS tablet Take 1 tablet by mouth daily at 12 noon.    . calcium carbonate (TUMS) 500 MG chewable tablet Chew 1 tablet (200 mg of elemental calcium total) by mouth every 8 (eight) hours as needed for indigestion or heartburn. (Patient not taking: Reported on 11/13/2016) 30 tablet 1  . promethazine (PHENERGAN) 25 MG tablet Take 1 tablet (25 mg total) by mouth every 6 (six) hours as needed for nausea or vomiting. (Patient not taking: Reported on 11/13/2016) 30 tablet 2   No Known Allergies  I have reviewed patient's Past Medical Hx, Surgical Hx, Family Hx, Social Hx, medications and allergies.   ROS:  Review of Systems  Constitutional: Negative for fever.  Gastrointestinal: Positive for abdominal pain, nausea and vomiting. Negative for constipation, diarrhea and melena.       Excess saliva    Review of Systems  Other systems negative   Physical Exam  Physical Exam Patient Vitals for the past 24 hrs:  BP Temp Temp src Pulse Resp Height Weight  11/13/16 1619 118/76 98.8 F (37.1 C) Oral 80 16 4\' 11"  (1.499 m) 120 lb (54.4 kg)   Constitutional: Well-developed, well-nourished female in no acute distress.  Cardiovascular: normal rate Respiratory: normal effort GI: Abd soft, non-tender. Pos BS x 4 MS: Extremities nontender, no edema, normal ROM Neurologic: Alert and oriented x 4.  GU: Neg CVAT.  FHT ?160 by bedside  US.  Fetus appears to be more like 6 wk size   LAB RESULTS Results for orders placed or performed during the hospital encounter of 11/13/16 (from the past 24 hour(s))  Urinalysis, Routine w reflex microscopic     Status: Abnormal   Collection Time: 11/13/16  4:25 PM  Result Value Ref Range   Color, Urine YELLOW YELLOW   APPearance HAZY (A) CLEAR   Specific Gravity, Urine 1.025 1.005 - 1.030   pH 7.0 5.0 - 8.0   Glucose, UA NEGATIVE NEGATIVE mg/dL    Hgb urine dipstick NEGATIVE NEGATIVE   Bilirubin Urine NEGATIVE NEGATIVE   Ketones, ur 20 (A) NEGATIVE mg/dL   Protein, ur NEGATIVE NEGATIVE mg/dL   Nitrite NEGATIVE NEGATIVE   Leukocytes, UA LARGE (A) NEGATIVE   RBC / HPF 0-5 0 - 5 RBC/hpf   WBC, UA 0-5 0 - 5 WBC/hpf   Bacteria, UA RARE (A) NONE SEEN   Squamous Epithelial / LPF 6-30 (A) NONE SEEN   Mucous PRESENT        IMAGING No results found.  MAU Management/MDM: Bedside US done, showed a fetus approximately 6 wks size with HR 160 IV of NS given. Pt able to tolerate POs after that Robinul given with good results. Less saliva and less nausea  ASSESSMENT SIUP at ?6 wks Nausea and vomiting Ptyalism UTi  PLAN Discharge home Rx Robinul for ptyalism Rx Macrobid for UTI Has rx phenergan Urine to culture   Pt stable at time of discharge. Encouraged to return here or to other Urgent Care/ED if she develops worsening of symptoms, increase in pain, fever, or other concerning symptoms.    Wynelle BourgeoisMarie Dalton Mille CNM, MSN Certified Nurse-Midwife 11/13/2016  6:36 PM

## 2016-11-23 ENCOUNTER — Ambulatory Visit (INDEPENDENT_AMBULATORY_CARE_PROVIDER_SITE_OTHER): Payer: Medicaid Other | Admitting: Obstetrics and Gynecology

## 2016-11-23 ENCOUNTER — Encounter: Payer: Self-pay | Admitting: Obstetrics and Gynecology

## 2016-11-23 ENCOUNTER — Other Ambulatory Visit (HOSPITAL_COMMUNITY)
Admission: RE | Admit: 2016-11-23 | Discharge: 2016-11-23 | Disposition: A | Discharge status: Home / Self Care | Payer: Medicaid Other | Source: Ambulatory Visit | Attending: Obstetrics and Gynecology | Admitting: Obstetrics and Gynecology

## 2016-11-23 DIAGNOSIS — Z23 Encounter for immunization: Secondary | ICD-10-CM | POA: Diagnosis not present

## 2016-11-23 DIAGNOSIS — Z113 Encounter for screening for infections with a predominantly sexual mode of transmission: Secondary | ICD-10-CM | POA: Insufficient documentation

## 2016-11-23 DIAGNOSIS — O09219 Supervision of pregnancy with history of pre-term labor, unspecified trimester: Secondary | ICD-10-CM

## 2016-11-23 DIAGNOSIS — Z01419 Encounter for gynecological examination (general) (routine) without abnormal findings: Secondary | ICD-10-CM | POA: Insufficient documentation

## 2016-11-23 DIAGNOSIS — Z3481 Encounter for supervision of other normal pregnancy, first trimester: Secondary | ICD-10-CM

## 2016-11-23 DIAGNOSIS — O09211 Supervision of pregnancy with history of pre-term labor, first trimester: Secondary | ICD-10-CM

## 2016-11-23 DIAGNOSIS — Z8751 Personal history of pre-term labor: Secondary | ICD-10-CM | POA: Insufficient documentation

## 2016-11-23 DIAGNOSIS — O0990 Supervision of high risk pregnancy, unspecified, unspecified trimester: Secondary | ICD-10-CM

## 2016-11-23 MED ORDER — AZITHROMYCIN 250 MG PO TABS: ORAL_TABLET | ORAL | 0 refills | Status: DC

## 2016-11-23 MED ORDER — DOXYLAMINE-PYRIDOXINE 10-10 MG PO TBEC: DELAYED_RELEASE_TABLET | ORAL | 6 refills | Status: DC

## 2016-11-23 NOTE — Progress Notes (Signed)
Patient reports she has excessive spitting and sinus problems. She states the medication is not helping. Patient has had a headache for 3 days.

## 2016-11-23 NOTE — Progress Notes (Signed)
Subjective:    Maria Riley is a [id number] 6770w1d being seen today for her first obstetrical visit.  Her obstetrical history is significant for 2 previous 36 weeks preterm deliveries without 17-P. Patient does intend to breast feed. Pregnancy history fully reviewed.  Patient reports greenish nasal discharge for the past few days accompanied pressure behind her left eye and a frontal headache for the past 3 days. She also reports some nausea and emesis.  Vitals:   11/23/16 1454  BP: 100/66  Pulse: 100  Weight: 121 lb 8 oz (55.1 kg)    HISTORY: OB History  Gravida Para Term Preterm AB Living  3 2 0 2   2  SAB TAB Ectopic Multiple Live Births        0 1    # Outcome Date GA Lbr Len/2nd Weight Sex Delivery Anes PTL Lv  3 Current           2 Preterm 12/19/14 952w3d 11:57 / 00:37 5 lb 5 oz (2.41 kg) F Vag-Spont EPI  LIV  1 Preterm 03/18/11 4949w0d   M Vag-Spont        Past Medical History:  Diagnosis Date  . Medical history non-contributory    Past Surgical History:  Procedure Laterality Date  . MOUTH SURGERY     Family History  Problem Relation Age of Onset  . Alcohol abuse Neg Hx   . Arthritis Neg Hx   . Asthma Neg Hx   . Birth defects Neg Hx   . Cancer Neg Hx   . COPD Neg Hx   . Depression Neg Hx   . Diabetes Neg Hx   . Drug abuse Neg Hx   . Early death Neg Hx   . Hearing loss Neg Hx   . Heart disease Neg Hx   . Hyperlipidemia Neg Hx   . Hypertension Neg Hx   . Kidney disease Neg Hx   . Learning disabilities Neg Hx   . Mental illness Neg Hx   . Mental retardation Neg Hx   . Miscarriages / Stillbirths Neg Hx   . Stroke Neg Hx   . Vision loss Neg Hx   . Varicose Veins Neg Hx      Exam    Uterus:     Pelvic Exam:    Perineum: No Hemorrhoids, Normal Perineum   Vulva: normal   Vagina:  normal mucosa, normal discharge   pH:    Cervix: multiparous appearance and cervix is closed and long   Adnexa: normal adnexa and no mass, fullness, tenderness   Bony  Pelvis: gynecoid  System: Breast:  normal appearance, no masses or tenderness   Skin: normal coloration and turgor, no rashes    Neurologic: oriented, no focal deficits   Extremities: normal strength, tone, and muscle mass   HEENT extra ocular movement intact   Mouth/Teeth mucous membranes moist, pharynx normal without lesions and dental hygiene good   Neck supple and no masses   Cardiovascular: regular rate and rhythm   Respiratory:  chest clear, no wheezing, crepitations, rhonchi, normal symmetric air entry   Abdomen: soft, non-tender; bowel sounds normal; no masses,  no organomegaly   Urinary:       Assessment:    Pregnancy: [id number] Patient Active Problem List   Diagnosis Date Noted  . Supervision of high risk pregnancy, antepartum 11/23/2016  . Previous preterm delivery, antepartum 11/23/2016        Plan:     Initial labs drawn. Prenatal vitamins.  Problem list reviewed and updated. Genetic Screening discussed : declined.  Ultrasound discussed; fetal survey: requested. Patient desires flu vaccine Rx diclegis provided to help with nausea and emesis Rx z-pack provided for sinusitis Patient with previous 36 weeks delivery x 2. Discussed the benefits of weekly 17-P injections. Patient desires to receive She is debating between LARC and BTL  Follow up in 4 weeks. 50% of 30 min visit spent on counseling and coordination of care.     Maria Riley 11/23/2016

## 2016-11-23 NOTE — Patient Instructions (Signed)
 First Trimester of Pregnancy The first trimester of pregnancy is from week 1 until the end of week 12 (months 1 through 3). A week after a sperm fertilizes an egg, the egg will implant on the wall of the uterus. This embryo will begin to develop into a baby. Genes from you and your partner are forming the baby. The female genes determine whether the baby is a boy or a girl. At 6-8 weeks, the eyes and face are formed, and the heartbeat can be seen on ultrasound. At the end of 12 weeks, all the baby's organs are formed.  Now that you are pregnant, you will want to do everything you can to have a healthy baby. Two of the most important things are to get good prenatal care and to follow your health care provider's instructions. Prenatal care is all the medical care you receive before the baby's birth. This care will help prevent, find, and treat any problems during the pregnancy and childbirth. BODY CHANGES Your body goes through many changes during pregnancy. The changes vary from woman to woman.   You may gain or lose a couple of pounds at first.  You may feel sick to your stomach (nauseous) and throw up (vomit). If the vomiting is uncontrollable, call your health care provider.  You may tire easily.  You may develop headaches that can be relieved by medicines approved by your health care provider.  You may urinate more often. Painful urination may mean you have a bladder infection.  You may develop heartburn as a result of your pregnancy.  You may develop constipation because certain hormones are causing the muscles that push waste through your intestines to slow down.  You may develop hemorrhoids or swollen, bulging veins (varicose veins).  Your breasts may begin to grow larger and become tender. Your nipples may stick out more, and the tissue that surrounds them (areola) may become darker.  Your gums may bleed and may be sensitive to brushing and flossing.  Dark spots or blotches  (chloasma, mask of pregnancy) may develop on your face. This will likely fade after the baby is born.  Your menstrual periods will stop.  You may have a loss of appetite.  You may develop cravings for certain kinds of food.  You may have changes in your emotions from day to day, such as being excited to be pregnant or being concerned that something may go wrong with the pregnancy and baby.  You may have more vivid and strange dreams.  You may have changes in your hair. These can include thickening of your hair, rapid growth, and changes in texture. Some women also have hair loss during or after pregnancy, or hair that feels dry or thin. Your hair will most likely return to normal after your baby is born. WHAT TO EXPECT AT YOUR PRENATAL VISITS During a routine prenatal visit:  You will be weighed to make sure you and the baby are growing normally.  Your blood pressure will be taken.  Your abdomen will be measured to track your baby's growth.  The fetal heartbeat will be listened to starting around week 10 or 12 of your pregnancy.  Test results from any previous visits will be discussed. Your health care provider may ask you:  How you are feeling.  If you are feeling the baby move.  If you have had any abnormal symptoms, such as leaking fluid, bleeding, severe headaches, or abdominal cramping.  If you are using any tobacco   products, including cigarettes, chewing tobacco, and electronic cigarettes.  If you have any questions. Other tests that may be performed during your first trimester include:  Blood tests to find your blood type and to check for the presence of any previous infections. They will also be used to check for low iron levels (anemia) and Rh antibodies. Later in the pregnancy, blood tests for diabetes will be done along with other tests if problems develop.  Urine tests to check for infections, diabetes, or protein in the urine.  An ultrasound to confirm the  proper growth and development of the baby.  An amniocentesis to check for possible genetic problems.  Fetal screens for spina bifida and Down syndrome.  You may need other tests to make sure you and the baby are doing well.  HIV (human immunodeficiency virus) testing. Routine prenatal testing includes screening for HIV, unless you choose not to have this test. HOME CARE INSTRUCTIONS  Medicines   Follow your health care provider's instructions regarding medicine use. Specific medicines may be either safe or unsafe to take during pregnancy.  Take your prenatal vitamins as directed.  If you develop constipation, try taking a stool softener if your health care provider approves. Diet   Eat regular, well-balanced meals. Choose a variety of foods, such as meat or vegetable-based protein, fish, milk and low-fat dairy products, vegetables, fruits, and whole grain breads and cereals. Your health care provider will help you determine the amount of weight gain that is right for you.  Avoid raw meat and uncooked cheese. These carry germs that can cause birth defects in the baby.  Eating four or five small meals rather than three large meals a day may help relieve nausea and vomiting. If you start to feel nauseous, eating a few soda crackers can be helpful. Drinking liquids between meals instead of during meals also seems to help nausea and vomiting.  If you develop constipation, eat more high-fiber foods, such as fresh vegetables or fruit and whole grains. Drink enough fluids to keep your urine clear or pale yellow. Activity and Exercise   Exercise only as directed by your health care provider. Exercising will help you:  Control your weight.  Stay in shape.  Be prepared for labor and delivery.  Experiencing pain or cramping in the lower abdomen or low back is a good sign that you should stop exercising. Check with your health care provider before continuing normal exercises.  Try to avoid  standing for long periods of time. Move your legs often if you must stand in one place for a long time.  Avoid heavy lifting.  Wear low-heeled shoes, and practice good posture.  You may continue to have sex unless your health care provider directs you otherwise. Relief of Pain or Discomfort   Wear a good support bra for breast tenderness.   Take warm sitz baths to soothe any pain or discomfort caused by hemorrhoids. Use hemorrhoid cream if your health care provider approves.   Rest with your legs elevated if you have leg cramps or low back pain.  If you develop varicose veins in your legs, wear support hose. Elevate your feet for 15 minutes, 3-4 times a day. Limit salt in your diet. Prenatal Care   Schedule your prenatal visits by the twelfth week of pregnancy. They are usually scheduled monthly at first, then more often in the last 2 months before delivery.  Write down your questions. Take them to your prenatal visits.  Keep all your   prenatal visits as directed by your health care provider. Safety   Wear your seat belt at all times when driving.  Make a list of emergency phone numbers, including numbers for family, friends, the hospital, and police and fire departments. General Tips   Ask your health care provider for a referral to a local prenatal education class. Begin classes no later than at the beginning of month 6 of your pregnancy.  Ask for help if you have counseling or nutritional needs during pregnancy. Your health care provider can offer advice or refer you to specialists for help with various needs.  Do not use hot tubs, steam rooms, or saunas.  Do not douche or use tampons or scented sanitary pads.  Do not cross your legs for long periods of time.  Avoid cat litter boxes and soil used by cats. These carry germs that can cause birth defects in the baby and possibly loss of the fetus by miscarriage or stillbirth.  Avoid all smoking, herbs, alcohol, and  medicines not prescribed by your health care provider. Chemicals in these affect the formation and growth of the baby.  Do not use any tobacco products, including cigarettes, chewing tobacco, and electronic cigarettes. If you need help quitting, ask your health care provider. You may receive counseling support and other resources to help you quit.  Schedule a dentist appointment. At home, brush your teeth with a soft toothbrush and be gentle when you floss. SEEK MEDICAL CARE IF:   You have dizziness.  You have mild pelvic cramps, pelvic pressure, or nagging pain in the abdominal area.  You have persistent nausea, vomiting, or diarrhea.  You have a bad smelling vaginal discharge.  You have pain with urination.  You notice increased swelling in your face, hands, legs, or ankles. SEEK IMMEDIATE MEDICAL CARE IF:   You have a fever.  You are leaking fluid from your vagina.  You have spotting or bleeding from your vagina.  You have severe abdominal cramping or pain.  You have rapid weight gain or loss.  You vomit blood or material that looks like coffee grounds.  You are exposed to [place] measles and have never had them.  You are exposed to fifth disease or chickenpox.  You develop a severe headache.  You have shortness of breath.  You have any kind of trauma, such as from a fall or a car accident. This information is not intended to replace advice given to you by your health care provider. Make sure you discuss any questions you have with your health care provider. Document Released: 11/17/2001 Document Revised: 12/14/2014 Document Reviewed: 10/03/2013 Elsevier Interactive Patient Education  2017 [employer].  Second Trimester of Pregnancy The second trimester is from week 13 through week 28 (months 4 through 6). The second trimester is often a time when you feel your best. Your body has also adjusted to being pregnant, and you begin to feel better physically. Usually,  morning sickness has lessened or quit completely, you may have more energy, and you may have an increase in appetite. The second trimester is also a time when the fetus is growing rapidly. At the end of the sixth month, the fetus is about 9 inches long and weighs about 1 pounds. You will likely begin to feel the baby move (quickening) between 18 and 20 weeks of the pregnancy. Body changes during your second trimester Your body continues to go through many changes during your second trimester. The changes vary from woman to woman.  Your weight will continue to increase. You will notice your lower abdomen bulging out.  You may begin to get stretch marks on your hips, abdomen, and breasts.  You may develop headaches that can be relieved by medicines. The medicines should be approved by your health care provider.  You may urinate more often because the fetus is pressing on your bladder.  You may develop or continue to have heartburn as a result of your pregnancy.  You may develop constipation because certain hormones are causing the muscles that push waste through your intestines to slow down.  You may develop hemorrhoids or swollen, bulging veins (varicose veins).  You may have back pain. This is caused by:  Weight gain.  Pregnancy hormones that are relaxing the joints in your pelvis.  A shift in weight and the muscles that support your balance.  Your breasts will continue to grow and they will continue to become tender.  Your gums may bleed and may be sensitive to brushing and flossing.  Dark spots or blotches (chloasma, mask of pregnancy) may develop on your face. This will likely fade after the baby is born.  A dark line from your belly button to the pubic area (linea nigra) may appear. This will likely fade after the baby is born.  You may have changes in your hair. These can include thickening of your hair, rapid growth, and changes in texture. Some women also have hair loss  during or after pregnancy, or hair that feels dry or thin. Your hair will most likely return to normal after your baby is born. What to expect at prenatal visits During a routine prenatal visit:  You will be weighed to make sure you and the fetus are growing normally.  Your blood pressure will be taken.  Your abdomen will be measured to track your baby's growth.  The fetal heartbeat will be listened to.  Any test results from the previous visit will be discussed. Your health care provider may ask you:  How you are feeling.  If you are feeling the baby move.  If you have had any abnormal symptoms, such as leaking fluid, bleeding, severe headaches, or abdominal cramping.  If you are using any tobacco products, including cigarettes, chewing tobacco, and electronic cigarettes.  If you have any questions. Other tests that may be performed during your second trimester include:  Blood tests that check for:  Low iron levels (anemia).  Gestational diabetes (between 24 and 28 weeks).  Rh antibodies. This is to check for a protein on red blood cells (Rh factor).  Urine tests to check for infections, diabetes, or protein in the urine.  An ultrasound to confirm the proper growth and development of the baby.  An amniocentesis to check for possible genetic problems.  Fetal screens for spina bifida and Down syndrome.  HIV (human immunodeficiency virus) testing. Routine prenatal testing includes screening for HIV, unless you choose not to have this test. Follow these instructions at home: Eating and drinking  Continue to eat regular, healthy meals.  Avoid raw meat, uncooked cheese, cat litter boxes, and soil used by cats. These carry germs that can cause birth defects in the baby.  Take your prenatal vitamins.  Take 1500-2000 mg of calcium daily starting at the 20th week of pregnancy until you deliver your baby.  If you develop constipation:  Take over-the-counter or  prescription medicines.  Drink enough fluid to keep your urine clear or pale yellow.  Eat foods that are high  in fiber, such as fresh fruits and vegetables, whole grains, and beans.  Limit foods that are high in fat and processed sugars, such as fried and sweet foods. Activity  Exercise only as directed by your health care provider. Experiencing uterine cramps is a good sign to stop exercising.  Avoid heavy lifting, wear low heel shoes, and practice good posture.  Wear your seat belt at all times when driving.  Rest with your legs elevated if you have leg cramps or low back pain.  Wear a good support bra for breast tenderness.  Do not use hot tubs, steam rooms, or saunas. Lifestyle  Avoid all smoking, herbs, alcohol, and unprescribed drugs. These chemicals affect the formation and growth of the baby.  Do not use any products that contain nicotine or tobacco, such as cigarettes and e-cigarettes. If you need help quitting, ask your health care provider.  A sexual relationship may be continued unless your health care provider directs you otherwise. General instructions  Follow your health care provider's instructions regarding medicine use. There are medicines that are either safe or unsafe to take during pregnancy.  Take warm sitz baths to soothe any pain or discomfort caused by hemorrhoids. Use hemorrhoid cream if your health care provider approves.  If you develop varicose veins, wear support hose. Elevate your feet for 15 minutes, 3-4 times a day. Limit salt in your diet.  Visit your dentist if you have not gone yet during your pregnancy. Use a soft toothbrush to brush your teeth and be gentle when you floss.  Keep all follow-up prenatal visits as told by your health care provider. This is important. Contact a health care provider if:  You have dizziness.  You have mild pelvic cramps, pelvic pressure, or nagging pain in the abdominal area.  You have persistent nausea,  vomiting, or diarrhea.  You have a bad smelling vaginal discharge.  You have pain with urination. Get help right away if:  You have a fever.  You are leaking fluid from your vagina.  You have spotting or bleeding from your vagina.  You have severe abdominal cramping or pain.  You have rapid weight gain or weight loss.  You have shortness of breath with chest pain.  You notice sudden or extreme swelling of your face, hands, ankles, feet, or legs.  You have not felt your baby move in over an hour.  You have severe headaches that do not go away with medicine.  You have vision changes. Summary  The second trimester is from week 13 through week 28 (months 4 through 6). It is also a time when the fetus is growing rapidly.  Your body goes through many changes during pregnancy. The changes vary from woman to woman.  Avoid all smoking, herbs, alcohol, and unprescribed drugs. These chemicals affect the formation and growth your baby.  Do not use any tobacco products, such as cigarettes, chewing tobacco, and e-cigarettes. If you need help quitting, ask your health care provider.  Contact your health care provider if you have any questions. Keep all prenatal visits as told by your health care provider. This is important. This information is not intended to replace advice given to you by your health care provider. Make sure you discuss any questions you have with your health care provider. Document Released: 11/17/2001 Document Revised: 04/30/2016 Document Reviewed: 01/24/2013 Elsevier Interactive Patient Education  2017 [employer].  Contraception Choices Contraception (birth control) is the use of any methods or devices to prevent pregnancy.  Below are some methods to help avoid pregnancy. Hormonal methods  Contraceptive implant. This is a thin, plastic tube containing progesterone hormone. It does not contain estrogen hormone. Your health care provider inserts the tube in the  inner part of the upper arm. The tube can remain in place for up to 3 years. After 3 years, the implant must be removed. The implant prevents the ovaries from releasing an egg (ovulation), thickens the cervical mucus to prevent sperm from entering the uterus, and thins the lining of the inside of the uterus.  Progesterone-only injections. These injections are given every 3 months by your health care provider to prevent pregnancy. This synthetic progesterone hormone stops the ovaries from releasing eggs. It also thickens cervical mucus and changes the uterine lining. This makes it harder for sperm to survive in the uterus.  Birth control pills. These pills contain estrogen and progesterone hormone. They work by preventing the ovaries from releasing eggs (ovulation). They also cause the cervical mucus to thicken, preventing the sperm from entering the uterus. Birth control pills are prescribed by a health care provider.Birth control pills can also be used to treat heavy periods.  Minipill. This type of birth control pill contains only the progesterone hormone. They are taken every day of each month and must be prescribed by your health care provider.  Birth control patch. The patch contains hormones similar to those in birth control pills. It must be changed once a week and is prescribed by a health care provider.  Vaginal ring. The ring contains hormones similar to those in birth control pills. It is left in the vagina for 3 weeks, removed for 1 week, and then a new one is put back in place. The patient must be comfortable inserting and removing the ring from the vagina.A health care provider's prescription is necessary.  Emergency contraception. Emergency contraceptives prevent pregnancy after unprotected sexual intercourse. This pill can be taken right after sex or up to 5 days after unprotected sex. It is most effective the sooner you take the pills after having sexual intercourse. Most emergency  contraceptive pills are available without a prescription. Check with your pharmacist. Do not use emergency contraception as your only form of birth control. Barrier methods  Female condom. This is a thin sheath (latex or rubber) that is worn over the penis during sexual intercourse. It can be used with spermicide to increase effectiveness.  Female condom. This is a soft, loose-fitting sheath that is put into the vagina before sexual intercourse.  Diaphragm. This is a soft, latex, dome-shaped barrier that must be fitted by a health care provider. It is inserted into the vagina, along with a spermicidal jelly. It is inserted before intercourse. The diaphragm should be left in the vagina for 6 to 8 hours after intercourse.  Cervical cap. This is a round, soft, latex or plastic cup that fits over the cervix and must be fitted by a health care provider. The cap can be left in place for up to 48 hours after intercourse.  Sponge. This is a soft, circular piece of polyurethane foam. The sponge has spermicide in it. It is inserted into the vagina after wetting it and before sexual intercourse.  Spermicides. These are chemicals that kill or block sperm from entering the cervix and uterus. They come in the form of creams, jellies, suppositories, foam, or tablets. They do not require a prescription. They are inserted into the vagina with an applicator before having sexual intercourse. The process must  be repeated every time you have sexual intercourse. Intrauterine contraception  Intrauterine device (IUD). This is a T-shaped device that is put in a woman's uterus during a menstrual period to prevent pregnancy. There are 2 types:  Copper IUD. This type of IUD is wrapped in copper wire and is placed inside the uterus. Copper makes the uterus and fallopian tubes produce a fluid that kills sperm. It can stay in place for 10 years.  Hormone IUD. This type of IUD contains the hormone progestin (synthetic  progesterone). The hormone thickens the cervical mucus and prevents sperm from entering the uterus, and it also thins the uterine lining to prevent implantation of a fertilized egg. The hormone can weaken or kill the sperm that get into the uterus. It can stay in place for 3-5 years, depending on which type of IUD is used. Permanent methods of contraception  Female tubal ligation. This is when the woman's fallopian tubes are surgically sealed, tied, or blocked to prevent the egg from traveling to the uterus.  Hysteroscopic sterilization. This involves placing a small coil or insert into each fallopian tube. Your doctor uses a technique called hysteroscopy to do the procedure. The device causes scar tissue to form. This results in permanent blockage of the fallopian tubes, so the sperm cannot fertilize the egg. It takes about 3 months after the procedure for the tubes to become blocked. You must use another form of birth control for these 3 months.  Female sterilization. This is when the female has the tubes that carry sperm tied off (vasectomy).This blocks sperm from entering the vagina during sexual intercourse. After the procedure, the man can still ejaculate fluid (semen). Natural planning methods  Natural family planning. This is not having sexual intercourse or using a barrier method (condom, diaphragm, cervical cap) on days the woman could become pregnant.  Calendar method. This is keeping track of the length of each menstrual cycle and identifying when you are fertile.  Ovulation method. This is avoiding sexual intercourse during ovulation.  Symptothermal method. This is avoiding sexual intercourse during ovulation, using a thermometer and ovulation symptoms.  Post-ovulation method. This is timing sexual intercourse after you have ovulated. Regardless of which type or method of contraception you choose, it is important that you use condoms to protect against the transmission of sexually  transmitted infections (STIs). Talk with your health care provider about which form of contraception is most appropriate for you. This information is not intended to replace advice given to you by your health care provider. Make sure you discuss any questions you have with your health care provider. Document Released: 11/23/2005 Document Revised: 04/30/2016 Document Reviewed: 05/18/2013 Elsevier Interactive Patient Education  2017 [employer].   Breastfeeding Deciding to breastfeed is one of the best choices you can make for you and your baby. A change in hormones during pregnancy causes your breast tissue to grow and increases the number and size of your milk ducts. These hormones also allow proteins, sugars, and fats from your blood supply to make breast milk in your milk-producing glands. Hormones prevent breast milk from being released before your baby is born as well as prompt milk flow after birth. Once breastfeeding has begun, thoughts of your baby, as well as his or her sucking or crying, can stimulate the release of milk from your milk-producing glands. Benefits of breastfeeding For Your Baby  Your first milk (colostrum) helps your baby's digestive system function better.  There are antibodies in your milk that help  your baby fight off infections.  Your baby has a lower incidence of asthma, allergies, and sudden infant death syndrome.  The nutrients in breast milk are better for your baby than infant formulas and are designed uniquely for your baby's needs.  Breast milk improves your baby's brain development.  Your baby is less likely to develop other conditions, such as childhood obesity, asthma, or type 2 diabetes mellitus. For You  Breastfeeding helps to create a very special bond between you and your baby.  Breastfeeding is convenient. Breast milk is always available at the correct temperature and costs nothing.  Breastfeeding helps to burn calories and helps you lose the  weight gained during pregnancy.  Breastfeeding makes your uterus contract to its prepregnancy size faster and slows bleeding (lochia) after you give birth.  Breastfeeding helps to lower your risk of developing type 2 diabetes mellitus, osteoporosis, and breast or ovarian cancer later in life. Signs that your baby is hungry Early Signs of Hunger  Increased alertness or activity.  Stretching.  Movement of the head from side to side.  Movement of the head and opening of the mouth when the corner of the mouth or cheek is stroked (rooting).  Increased sucking sounds, smacking lips, cooing, sighing, or squeaking.  Hand-to-mouth movements.  Increased sucking of fingers or hands. Late Signs of Hunger  Fussing.  Intermittent crying. Extreme Signs of Hunger  Signs of extreme hunger will require calming and consoling before your baby will be able to breastfeed successfully. Do not wait for the following signs of extreme hunger to occur before you initiate breastfeeding:  Restlessness.  A loud, strong cry.  Screaming. Breastfeeding basics  Breastfeeding Initiation  Find a comfortable place to sit or lie down, with your neck and back well supported.  Place a pillow or rolled up blanket under your baby to bring him or her to the level of your breast (if you are seated). Nursing pillows are specially designed to help support your arms and your baby while you breastfeed.  Make sure that your baby's abdomen is facing your abdomen.  Gently massage your breast. With your fingertips, massage from your chest wall toward your nipple in a circular motion. This encourages milk flow. You may need to continue this action during the feeding if your milk flows slowly.  Support your breast with 4 fingers underneath and your thumb above your nipple. Make sure your fingers are well away from your nipple and your baby's mouth.  Stroke your baby's lips gently with your finger or nipple.  When your  baby's mouth is open wide enough, quickly bring your baby to your breast, placing your entire nipple and as much of the colored area around your nipple (areola) as possible into your baby's mouth.  More areola should be visible above your baby's upper lip than below the lower lip.  Your baby's tongue should be between his or her lower gum and your breast.  Ensure that your baby's mouth is correctly positioned around your nipple (latched). Your baby's lips should create a seal on your breast and be turned out (everted).  It is common for your baby to suck about 2-3 minutes in order to start the flow of breast milk. Latching  Teaching your baby how to latch on to your breast properly is very important. An improper latch can cause nipple pain and decreased milk supply for you and poor weight gain in your baby. Also, if your baby is not latched onto your nipple  properly, he or she may swallow some air during feeding. This can make your baby fussy. Burping your baby when you switch breasts during the feeding can help to get rid of the air. However, teaching your baby to latch on properly is still the best way to prevent fussiness from swallowing air while breastfeeding. Signs that your baby has successfully latched on to your nipple:  Silent tugging or silent sucking, without causing you pain.  Swallowing heard between every 3-4 sucks.  Muscle movement above and in front of his or her ears while sucking. Signs that your baby has not successfully latched on to nipple:  Sucking sounds or smacking sounds from your baby while breastfeeding.  Nipple pain. If you think your baby has not latched on correctly, slip your finger into the corner of your baby's mouth to break the suction and place it between your baby's gums. Attempt breastfeeding initiation again. Signs of Successful Breastfeeding  Signs from your baby:  A gradual decrease in the number of sucks or complete cessation of  sucking.  Falling asleep.  Relaxation of his or her body.  Retention of a small amount of milk in his or her mouth.  Letting go of your breast by himself or herself. Signs from you:  Breasts that have increased in firmness, weight, and size 1-3 hours after feeding.  Breasts that are softer immediately after breastfeeding.  Increased milk volume, as well as a change in milk consistency and color by the fifth day of breastfeeding.  Nipples that are not sore, cracked, or bleeding. Signs That Your Pecola Leisure is Getting Enough Milk  Wetting at least 1-2 diapers during the first 24 hours after birth.  Wetting at least 5-6 diapers every 24 hours for the first week after birth. The urine should be clear or pale yellow by 5 days after birth.  Wetting 6-8 diapers every 24 hours as your baby continues to grow and develop.  At least 3 stools in a 24-hour period by age 38 days. The stool should be soft and yellow.  At least 3 stools in a 24-hour period by age 28 days. The stool should be seedy and yellow.  No loss of weight greater than 10% of birth weight during the first 66 days of age.  Average weight gain of 4-7 ounces (113-198 g) per week after age 40 days.  Consistent daily weight gain by age 38 days, without weight loss after the age of 2 weeks. After a feeding, your baby may spit up a small amount. This is common. Breastfeeding frequency and duration Frequent feeding will help you make more milk and can prevent sore nipples and breast engorgement. Breastfeed when you feel the need to reduce the fullness of your breasts or when your baby shows signs of hunger. This is called "breastfeeding on demand." Avoid introducing a pacifier to your baby while you are working to establish breastfeeding (the first 4-6 weeks after your baby is born). After this time you may choose to use a pacifier. Research has shown that pacifier use during the first year of a baby's life decreases the risk of sudden infant  death syndrome (SIDS). Allow your baby to feed on each breast as long as he or she wants. Breastfeed until your baby is finished feeding. When your baby unlatches or falls asleep while feeding from the first breast, offer the second breast. Because newborns are often sleepy in the first few weeks of life, you may need to awaken your baby to  get him or her to feed. Breastfeeding times will vary from baby to baby. However, the following rules can serve as a guide to help you ensure that your baby is properly fed:  Newborns (babies 11 weeks of age or younger) may breastfeed every 1-3 hours.  Newborns should not go longer than 3 hours during the day or 5 hours during the night without breastfeeding.  You should breastfeed your baby a minimum of 8 times in a 24-hour period until you begin to introduce solid foods to your baby at around 70 months of age. Breast milk pumping Pumping and storing breast milk allows you to ensure that your baby is exclusively fed your breast milk, even at times when you are unable to breastfeed. This is especially important if you are going back to work while you are still breastfeeding or when you are not able to be present during feedings. Your lactation consultant can give you guidelines on how long it is safe to store breast milk. A breast pump is a machine that allows you to pump milk from your breast into a sterile bottle. The pumped breast milk can then be stored in a refrigerator or freezer. Some breast pumps are operated by hand, while others use electricity. Ask your lactation consultant which type will work best for you. Breast pumps can be purchased, but some hospitals and breastfeeding support groups lease breast pumps on a monthly basis. A lactation consultant can teach you how to hand express breast milk, if you prefer not to use a pump. Caring for your breasts while you breastfeed Nipples can become dry, cracked, and sore while breastfeeding. The following  recommendations can help keep your breasts moisturized and healthy:  Avoid using soap on your nipples.  Wear a supportive bra. Although not required, special nursing bras and tank tops are designed to allow access to your breasts for breastfeeding without taking off your entire bra or top. Avoid wearing underwire-style bras or extremely tight bras.  Air dry your nipples for 3-95minutes after each feeding.  Use only cotton bra pads to absorb leaked breast milk. Leaking of breast milk between feedings is normal.  Use lanolin on your nipples after breastfeeding. Lanolin helps to maintain your skin's normal moisture barrier. If you use pure lanolin, you do not need to wash it off before feeding your baby again. Pure lanolin is not toxic to your baby. You may also hand express a few drops of breast milk and gently massage that milk into your nipples and allow the milk to air dry. In the first few weeks after giving birth, some women experience extremely full breasts (engorgement). Engorgement can make your breasts feel heavy, warm, and tender to the touch. Engorgement peaks within 3-5 days after you give birth. The following recommendations can help ease engorgement:  Completely empty your breasts while breastfeeding or pumping. You may want to start by applying warm, moist heat (in the shower or with warm water-soaked hand towels) just before feeding or pumping. This increases circulation and helps the milk flow. If your baby does not completely empty your breasts while breastfeeding, pump any extra milk after he or she is finished.  Wear a snug bra (nursing or regular) or tank top for 1-2 days to signal your body to slightly decrease milk production.  Apply ice packs to your breasts, unless this is too uncomfortable for you.  Make sure that your baby is latched on and positioned properly while breastfeeding. If engorgement persists after 48  hours of following these recommendations, contact your  health care provider or a Advertising copywriter. Overall health care recommendations while breastfeeding  Eat healthy foods. Alternate between meals and snacks, eating 3 of each per day. Because what you eat affects your breast milk, some of the foods may make your baby more irritable than usual. Avoid eating these foods if you are sure that they are negatively affecting your baby.  Drink milk, fruit juice, and water to satisfy your thirst (about 10 glasses a day).  Rest often, relax, and continue to take your prenatal vitamins to prevent fatigue, stress, and anemia.  Continue breast self-awareness checks.  Avoid chewing and smoking tobacco. Chemicals from cigarettes that pass into breast milk and exposure to secondhand smoke may harm your baby.  Avoid alcohol and drug use, including marijuana. Some medicines that may be harmful to your baby can pass through breast milk. It is important to ask your health care provider before taking any medicine, including all over-the-counter and prescription medicine as well as vitamin and herbal supplements. It is possible to become pregnant while breastfeeding. If birth control is desired, ask your health care provider about options that will be safe for your baby. Contact a health care provider if:  You feel like you want to stop breastfeeding or have become frustrated with breastfeeding.  You have painful breasts or nipples.  Your nipples are cracked or bleeding.  Your breasts are red, tender, or warm.  You have a swollen area on either breast.  You have a fever or chills.  You have nausea or vomiting.  You have drainage other than breast milk from your nipples.  Your breasts do not become full before feedings by the fifth day after you give birth.  You feel sad and depressed.  Your baby is too sleepy to eat well.  Your baby is having trouble sleeping.  Your baby is wetting less than 3 diapers in a 24-hour period.  Your baby has less  than 3 stools in a 24-hour period.  Your baby's skin or the white part of his or her eyes becomes yellow.  Your baby is not gaining weight by 43 days of age. Get help right away if:  Your baby is overly tired (lethargic) and does not want to wake up and feed.  Your baby develops an unexplained fever. This information is not intended to replace advice given to you by your health care provider. Make sure you discuss any questions you have with your health care provider. Document Released: 11/23/2005 Document Revised: 05/06/2016 Document Reviewed: 05/17/2013 Elsevier Interactive Patient Education  2017 [employer].   Preterm Labor and Birth Information The normal length of a pregnancy is 39-41 weeks. Preterm labor is when labor starts before 37 completed weeks of pregnancy. What are the risk factors for preterm labor? Preterm labor is more likely to occur in women who:  Have certain infections during pregnancy such as a bladder infection, sexually transmitted infection, or infection inside the uterus (chorioamnionitis).  Have a shorter-than-normal cervix.  Have gone into preterm labor before.  Have had surgery on their cervix.  Are younger than age 52 or older than age 53.  Are African American.  Are pregnant with twins or multiple babies (multiple gestation).  Take street drugs or smoke while pregnant.  Do not gain enough weight while pregnant.  Became pregnant shortly after having been pregnant. What are the symptoms of preterm labor? Symptoms of preterm labor include:  Cramps similar to those  that can happen during a menstrual period. The cramps may happen with diarrhea.  Pain in the abdomen or lower back.  Regular uterine contractions that may feel like tightening of the abdomen.  A feeling of increased pressure in the pelvis.  Increased watery or bloody mucus discharge from the vagina.  Water breaking (ruptured amniotic sac). Why is it important to recognize  signs of preterm labor? It is important to recognize signs of preterm labor because babies who are born prematurely may not be fully developed. This can put them at an increased risk for:  Long-term (chronic) heart and lung problems.  Difficulty immediately after birth with regulating body systems, including blood sugar, body temperature, heart rate, and breathing rate.  Bleeding in the brain.  Cerebral palsy.  Learning difficulties.  Death. These risks are highest for babies who are born before 34 weeks of pregnancy. How is preterm labor treated? Treatment depends on the length of your pregnancy, your condition, and the health of your baby. It may involve:  Having a stitch (suture) placed in your cervix to prevent your cervix from opening too early (cerclage).  Taking or being given medicines, such as:  Hormone medicines. These may be given early in pregnancy to help support the pregnancy.  Medicine to stop contractions.  Medicines to help mature the baby's lungs. These may be prescribed if the risk of delivery is high.  Medicines to prevent your baby from developing cerebral palsy. If the labor happens before 34 weeks of pregnancy, you may need to stay in the hospital. What should I do if I think I am in preterm labor? If you think that you are going into preterm labor, call your health care provider right away. How can I prevent preterm labor in future pregnancies? To increase your chance of having a full-term pregnancy:  Do not use any tobacco products, such as cigarettes, chewing tobacco, and e-cigarettes. If you need help quitting, ask your health care provider.  Do not use street drugs or medicines that have not been prescribed to you during your pregnancy.  Talk with your health care provider before taking any herbal supplements, even if you have been taking them regularly.  Make sure you gain a healthy amount of weight during your pregnancy.  Watch for infection.  If you think that you might have an infection, get it checked right away.  Make sure to tell your health care provider if you have gone into preterm labor before. This information is not intended to replace advice given to you by your health care provider. Make sure you discuss any questions you have with your health care provider. Document Released: 02/13/2004 Document Revised: 05/05/2016 Document Reviewed: 04/15/2016 Elsevier Interactive Patient Education  2017 [employer]

## 2016-11-25 ENCOUNTER — Telehealth: Payer: Self-pay

## 2016-11-25 LAB — CULTURE, OB URINE

## 2016-11-25 LAB — GC/CHLAMYDIA PROBE AMP (~~LOC~~) NOT AT ARMC
CHLAMYDIA, DNA PROBE: NEGATIVE
NEISSERIA GONORRHEA: NEGATIVE

## 2016-11-25 LAB — CYTOLOGY - PAP: Diagnosis: NEGATIVE

## 2016-11-25 LAB — URINE CULTURE, OB REFLEX

## 2016-11-25 NOTE — Telephone Encounter (Signed)
Returned call to pharmacy and answered questions concerning rx sent by provider.

## 2016-11-26 LAB — URINE CYTOLOGY ANCILLARY ONLY
Chlamydia: NEGATIVE
Neisseria Gonorrhea: NEGATIVE

## 2016-12-01 LAB — OBSTETRIC PANEL, INCLUDING HIV
ANTIBODY SCREEN: NEGATIVE
BASOS: 0 %
Basophils Absolute: 0 10*3/uL (ref 0.0–0.2)
EOS (ABSOLUTE): 0.1 10*3/uL (ref 0.0–0.4)
Eos: 1 %
HEMATOCRIT: 36 % (ref 34.0–46.6)
HIV SCREEN 4TH GENERATION: NONREACTIVE
Hemoglobin: 12.2 g/dL (ref 11.1–15.9)
Hepatitis B Surface Ag: NEGATIVE
Immature Grans (Abs): 0 10*3/uL (ref 0.0–0.1)
Immature Granulocytes: 0 %
LYMPHS: 15 %
Lymphocytes Absolute: 1.5 10*3/uL (ref 0.7–3.1)
MCH: 28.2 pg (ref 26.6–33.0)
MCHC: 33.9 g/dL (ref 31.5–35.7)
MCV: 83 fL (ref 79–97)
MONOS ABS: 0.4 10*3/uL (ref 0.1–0.9)
Monocytes: 4 %
NEUTROS ABS: 8.1 10*3/uL — AB (ref 1.4–7.0)
Neutrophils: 80 %
PLATELETS: 276 10*3/uL (ref 150–379)
RBC: 4.33 x10E6/uL (ref 3.77–5.28)
RDW: 13.9 % (ref 12.3–15.4)
RPR Ser Ql: NONREACTIVE
Rh Factor: POSITIVE
Rubella Antibodies, IGG: 4.09 index (ref >0.99)
WBC: 10 10*3/uL (ref 3.4–10.8)

## 2016-12-01 LAB — HEMOGLOBINOPATHY EVALUATION
HEMOGLOBIN A2 QUANTITATION: 3 % (ref 1.8–3.2)
HGB A: 97 % (ref 96.4–98.8)
HGB C: 0 %
HGB S: 0 %
HGB VARIANT: 0 %
Hemoglobin F Quantitation: 0 % (ref 0.0–2.0)

## 2016-12-01 LAB — CYSTIC FIBROSIS MUTATION 97: GENE DIS ANAL CARRIER INTERP BLD/T-IMP: NOT DETECTED

## 2016-12-01 LAB — TOXASSURE SELECT 13 (MW), URINE

## 2016-12-01 LAB — VARICELLA ZOSTER ANTIBODY, IGG: Varicella zoster IgG: 667 index (ref >165)

## 2016-12-07 NOTE — L&D Delivery Note (Signed)
Delivery Note At 5:33 PM a viable female was delivered via Vaginal, Spontaneous Delivery (Presentation: LOA).  APGAR: 8,9 ; weight 6 lbs 2.8 oz.   Placenta status: via Tomasa BlaseSchultz; intact .  Cord:  3VC with no complications.  Cord pH: n/a  Anesthesia:  epidural Episiotomy: None Lacerations: None Suture Repair: n/a Est. Blood Loss (mL): 100  Mom to postpartum.  Baby to Couplet care / Skin to Skin.  Maria Moraolitta Makya Phillis, MSN, CNM 05/21/2017, 5:45 PM

## 2016-12-08 ENCOUNTER — Ambulatory Visit: Payer: Self-pay

## 2016-12-28 ENCOUNTER — Ambulatory Visit (INDEPENDENT_AMBULATORY_CARE_PROVIDER_SITE_OTHER): Payer: Medicaid Other | Admitting: Obstetrics and Gynecology

## 2016-12-28 VITALS — BP 89/57 | HR 90 | Wt 122.0 lb

## 2016-12-28 DIAGNOSIS — O09212 Supervision of pregnancy with history of pre-term labor, second trimester: Secondary | ICD-10-CM

## 2016-12-28 DIAGNOSIS — O0990 Supervision of high risk pregnancy, unspecified, unspecified trimester: Secondary | ICD-10-CM

## 2016-12-28 DIAGNOSIS — O09219 Supervision of pregnancy with history of pre-term labor, unspecified trimester: Secondary | ICD-10-CM

## 2016-12-28 MED ORDER — HYDROXYPROGESTERONE CAPROATE 250 MG/ML IM OIL
250.0000 mg | TOPICAL_OIL | INTRAMUSCULAR | Status: AC
  Administered 2016-12-28 – 2017-05-13 (×18): 250 mg via INTRAMUSCULAR

## 2016-12-28 NOTE — Addendum Note (Signed)
Addended by: Marya LandryFOSTER, Taziyah Iannuzzi D on: 12/28/2016 04:26 PM   Modules accepted: Orders

## 2016-12-28 NOTE — Progress Notes (Signed)
Subjective:  [name] is a 26 y.o. [phone] at 5080w1d being seen today for ongoing prenatal care.  She is currently monitored for the following issues for this high-risk pregnancy and has Supervision of high risk pregnancy, antepartum and Previous preterm delivery, antepartum on her problem list.  Patient reports general discomforts of pregnancy.  Contractions: Not present. Vag. Bleeding: None.   . Denies leaking of fluid.   The following portions of the patient's history were reviewed and updated as appropriate: allergies, current medications, past family history, past medical history, past social history, past surgical history and problem list. Problem list updated.  Objective:   Vitals:   12/28/16 1510  BP: (!) 89/57  Pulse: 90  Weight: 122 lb (55.3 kg)    Fetal Status:           General:  Alert, oriented and cooperative. Patient is in no acute distress.  Skin: Skin is warm and dry. No rash noted.   Cardiovascular: Normal heart rate noted  Respiratory: Normal respiratory effort, no problems with respiration noted  Abdomen: Soft, gravid, appropriate for gestational age. Pain/Pressure: Absent     Pelvic:  Cervical exam deferred        Extremities: Normal range of motion.  Edema: None  Mental Status: Normal mood and affect. Normal behavior. Normal judgment and thought content.   Urinalysis:      Assessment and Plan:  Pregnancy: [id number] at 8280w1d  1. Previous preterm delivery, antepartum Continue with weekly 17 OHP  2. Supervision of high risk pregnancy, antepartum  - AFP, Quad Screen - US MFM OB COMP + 14 WK; Future  Preterm labor symptoms and general obstetric precautions including but not limited to vaginal bleeding, contractions, leaking of fluid and fetal movement were reviewed in detail with the patient. Please refer to After Visit Summary for other counseling recommendations.  No Follow-up on file.   Hermina StaggersMichael L Mizael Sagar, MD

## 2016-12-28 NOTE — Progress Notes (Signed)
Pt received 17p injection today. Pt tolerated well.

## 2016-12-28 NOTE — Patient Instructions (Signed)
Second Trimester of Pregnancy The second trimester is from week 13 through week 28 (months 4 through 6). The second trimester is often a time when you feel your best. Your body has also adjusted to being pregnant, and you begin to feel better physically. Usually, morning sickness has lessened or quit completely, you may have more energy, and you may have an increase in appetite. The second trimester is also a time when the fetus is growing rapidly. At the end of the sixth month, the fetus is about 9 inches long and weighs about 1 pounds. You will likely begin to feel the baby move (quickening) between 18 and 20 weeks of the pregnancy. Body changes during your second trimester Your body continues to go through many changes during your second trimester. The changes vary from woman to woman.  Your weight will continue to increase. You will notice your lower abdomen bulging out.  You may begin to get stretch marks on your hips, abdomen, and breasts.  You may develop headaches that can be relieved by medicines. The medicines should be approved by your health care provider.  You may urinate more often because the fetus is pressing on your bladder.  You may develop or continue to have heartburn as a result of your pregnancy.  You may develop constipation because certain hormones are causing the muscles that push waste through your intestines to slow down.  You may develop hemorrhoids or swollen, bulging veins (varicose veins).  You may have back pain. This is caused by:  Weight gain.  Pregnancy hormones that are relaxing the joints in your pelvis.  A shift in weight and the muscles that support your balance.  Your breasts will continue to grow and they will continue to become tender.  Your gums may bleed and may be sensitive to brushing and flossing.  Dark spots or blotches (chloasma, mask of pregnancy) may develop on your face. This will likely fade after the baby is born.  A dark line  from your belly button to the pubic area (linea nigra) may appear. This will likely fade after the baby is born.  You may have changes in your hair. These can include thickening of your hair, rapid growth, and changes in texture. Some women also have hair loss during or after pregnancy, or hair that feels dry or thin. Your hair will most likely return to normal after your baby is born. What to expect at prenatal visits During a routine prenatal visit:  You will be weighed to make sure you and the fetus are growing normally.  Your blood pressure will be taken.  Your abdomen will be measured to track your baby's growth.  The fetal heartbeat will be listened to.  Any test results from the previous visit will be discussed. Your health care provider may ask you:  How you are feeling.  If you are feeling the baby move.  If you have had any abnormal symptoms, such as leaking fluid, bleeding, severe headaches, or abdominal cramping.  If you are using any tobacco products, including cigarettes, chewing tobacco, and electronic cigarettes.  If you have any questions. Other tests that may be performed during your second trimester include:  Blood tests that check for:  Low iron levels (anemia).  Gestational diabetes (between 24 and 28 weeks).  Rh antibodies. This is to check for a protein on red blood cells (Rh factor).  Urine tests to check for infections, diabetes, or protein in the urine.  An ultrasound to   confirm the proper growth and development of the baby.  An amniocentesis to check for possible genetic problems.  Fetal screens for spina bifida and Down syndrome.  HIV (human immunodeficiency virus) testing. Routine prenatal testing includes screening for HIV, unless you choose not to have this test. Follow these instructions at home: Eating and drinking  Continue to eat regular, healthy meals.  Avoid raw meat, uncooked cheese, cat litter boxes, and soil used by cats. These  carry germs that can cause birth defects in the baby.  Take your prenatal vitamins.  Take 1500-2000 mg of calcium daily starting at the 20th week of pregnancy until you deliver your baby.  If you develop constipation:  Take over-the-counter or prescription medicines.  Drink enough fluid to keep your urine clear or pale yellow.  Eat foods that are high in fiber, such as fresh fruits and vegetables, whole grains, and beans.  Limit foods that are high in fat and processed sugars, such as fried and sweet foods. Activity  Exercise only as directed by your health care provider. Experiencing uterine cramps is a good sign to stop exercising.  Avoid heavy lifting, wear low heel shoes, and practice good posture.  Wear your seat belt at all times when driving.  Rest with your legs elevated if you have leg cramps or low back pain.  Wear a good support bra for breast tenderness.  Do not use hot tubs, steam rooms, or saunas. Lifestyle  Avoid all smoking, herbs, alcohol, and unprescribed drugs. These chemicals affect the formation and growth of the baby.  Do not use any products that contain nicotine or tobacco, such as cigarettes and e-cigarettes. If you need help quitting, ask your health care provider.  A sexual relationship may be continued unless your health care provider directs you otherwise. General instructions  Follow your health care provider's instructions regarding medicine use. There are medicines that are either safe or unsafe to take during pregnancy.  Take warm sitz baths to soothe any pain or discomfort caused by hemorrhoids. Use hemorrhoid cream if your health care provider approves.  If you develop varicose veins, wear support hose. Elevate your feet for 15 minutes, 3-4 times a day. Limit salt in your diet.  Visit your dentist if you have not gone yet during your pregnancy. Use a soft toothbrush to brush your teeth and be gentle when you floss.  Keep all follow-up  prenatal visits as told by your health care provider. This is important. Contact a health care provider if:  You have dizziness.  You have mild pelvic cramps, pelvic pressure, or nagging pain in the abdominal area.  You have persistent nausea, vomiting, or diarrhea.  You have a bad smelling vaginal discharge.  You have pain with urination. Get help right away if:  You have a fever.  You are leaking fluid from your vagina.  You have spotting or bleeding from your vagina.  You have severe abdominal cramping or pain.  You have rapid weight gain or weight loss.  You have shortness of breath with chest pain.  You notice sudden or extreme swelling of your face, hands, ankles, feet, or legs.  You have not felt your baby move in over an hour.  You have severe headaches that do not go away with medicine.  You have vision changes. Summary  The second trimester is from week 13 through week 28 (months 4 through 6). It is also a time when the fetus is growing rapidly.  Your body goes   through many changes during pregnancy. The changes vary from woman to woman.  Avoid all smoking, herbs, alcohol, and unprescribed drugs. These chemicals affect the formation and growth your baby.  Do not use any tobacco products, such as cigarettes, chewing tobacco, and e-cigarettes. If you need help quitting, ask your health care provider.  Contact your health care provider if you have any questions. Keep all prenatal visits as told by your health care provider. This is important. This information is not intended to replace advice given to you by your health care provider. Make sure you discuss any questions you have with your health care provider. Document Released: 11/17/2001 Document Revised: 04/30/2016 Document Reviewed: 01/24/2013 Elsevier Interactive Patient Education  2017 [employer]  

## 2017-01-01 LAB — AFP, QUAD SCREEN
DIA MOM VALUE: 0.45
DIA VALUE (EIA): 92.63 pg/mL
DSR (By Age)    1 IN: 1022
DSR (SECOND TRIMESTER) 1 IN: 10000
GESTATIONAL AGE AFP: 16.1 wk
MATERNAL AGE AT EDD: 25.2 a
MSAFP Mom: 0.99
MSAFP: 37.6 ng/mL
MSHCG Mom: 0.51
MSHCG: 23042 m[IU]/mL
Osb Risk: 10000
T18 (By Age): 1:3981 {titer}
TEST RESULTS AFP: NEGATIVE
WEIGHT: 122 [lb_av]
uE3 Mom: 1.29
uE3 Value: 1.16 ng/mL

## 2017-01-04 ENCOUNTER — Ambulatory Visit: Payer: Medicaid Other

## 2017-01-04 ENCOUNTER — Encounter: Payer: Self-pay | Admitting: Obstetrics

## 2017-01-04 VITALS — BP 90/60 | HR 89 | Temp 97.1°F | Wt 123.0 lb

## 2017-01-04 DIAGNOSIS — O0990 Supervision of high risk pregnancy, unspecified, unspecified trimester: Secondary | ICD-10-CM

## 2017-01-04 MED ORDER — HYDROXYPROGESTERONE CAPROATE 250 MG/ML IM OIL
250.0000 mg | TOPICAL_OIL | Freq: Once | INTRAMUSCULAR | Status: AC
  Administered 2017-01-04: 250 mg via INTRAMUSCULAR

## 2017-01-04 NOTE — Progress Notes (Signed)
Patient in office for routine 17p injection, no question or concerns, pt. Tolerated well given in right hip.

## 2017-01-11 ENCOUNTER — Ambulatory Visit (INDEPENDENT_AMBULATORY_CARE_PROVIDER_SITE_OTHER): Payer: Medicaid Other

## 2017-01-11 VITALS — Wt 124.0 lb

## 2017-01-11 DIAGNOSIS — O09212 Supervision of pregnancy with history of pre-term labor, second trimester: Secondary | ICD-10-CM

## 2017-01-11 DIAGNOSIS — O09219 Supervision of pregnancy with history of pre-term labor, unspecified trimester: Secondary | ICD-10-CM

## 2017-01-11 NOTE — Progress Notes (Signed)
17P injection given. Patient tolerated well .Marland Kitchen. Administrations This Visit    hydroxyprogesterone caproate (MAKENA) 250 mg/mL injection 250 mg    Admin Date 01/11/2017 Action Given Dose 250 mg Route Intramuscular Administered By Maretta Beesarol J Alvin Diffee, RMA

## 2017-01-18 ENCOUNTER — Ambulatory Visit: Payer: Medicaid Other

## 2017-01-18 DIAGNOSIS — O09219 Supervision of pregnancy with history of pre-term labor, unspecified trimester: Secondary | ICD-10-CM

## 2017-01-18 NOTE — Progress Notes (Signed)
Patient is here for 17P Injection. Shot given in RUOQ. Patient tolerated well. Administrations This Visit    hydroxyprogesterone caproate (MAKENA) 250 mg/mL injection 250 mg    Admin Date 01/18/2017 Action Given Dose 250 mg Route Intramuscular Administered By Maretta Beesarol J Jossalin Chervenak, RMA

## 2017-01-25 ENCOUNTER — Ambulatory Visit (INDEPENDENT_AMBULATORY_CARE_PROVIDER_SITE_OTHER): Payer: Medicaid Other | Admitting: Obstetrics

## 2017-01-25 ENCOUNTER — Encounter: Payer: Self-pay | Admitting: Obstetrics

## 2017-01-25 VITALS — BP 117/68 | HR 89 | Wt 127.4 lb

## 2017-01-25 DIAGNOSIS — Z3480 Encounter for supervision of other normal pregnancy, unspecified trimester: Secondary | ICD-10-CM

## 2017-01-25 DIAGNOSIS — O0990 Supervision of high risk pregnancy, unspecified, unspecified trimester: Secondary | ICD-10-CM

## 2017-01-25 DIAGNOSIS — O09212 Supervision of pregnancy with history of pre-term labor, second trimester: Secondary | ICD-10-CM

## 2017-01-25 NOTE — Progress Notes (Addendum)
Subjective:  Maria Riley is a 26 y.o. [phone] at 3520wBlondell Reveal1d being seen today for ongoing prenatal care.  She is currently monitored for the following issues for this High-Risk pregnancy and has Supervision of high risk pregnancy, antepartum and Previous preterm delivery, antepartum on her problem list.  Patient reports vaginal discharge.  Contractions: Not present. Vag. Bleeding: None.  Movement: Present. Denies leaking of fluid.   The following portions of the patient's history were reviewed and updated as appropriate: allergies, current medications, past family history, past medical history, past social history, past surgical history and problem list. Problem list updated.  Objective:   Vitals:   01/25/17 1409  BP: 117/68  Pulse: 89  Weight: 127 lb 6.4 oz (57.8 kg)    Fetal Status: Fetal Heart Rate (bpm): 140   Movement: Present     General:  Alert, oriented and cooperative. Patient is in no acute distress.  Skin: Skin is warm and dry. No rash noted.   Cardiovascular: Normal heart rate noted  Respiratory: Normal respiratory effort, no problems with respiration noted  Abdomen: Soft, gravid, appropriate for gestational age. Pain/Pressure: Present     Pelvic:  SSE:  No pooling.  Nitrazine and Fern Negative.  Cvx appears long and closed.  Extremities: Normal range of motion.  Edema: None  Mental Status: Normal mood and affect. Normal behavior. Normal judgment and thought content.   Urinalysis:      Assessment and Plan:  Pregnancy: [id number] at 8982w1d  There are no diagnoses linked to this encounter. Preterm labor symptoms and general obstetric precautions including but not limited to vaginal bleeding, contractions, leaking of fluid and fetal movement were reviewed in detail with the patient. Please refer to After Visit Summary for other counseling recommendations.  F/U in 1 week.  Weekly 17 OH - P   Brock Badharles A Carman Essick, MDPatient ID: Blondell RevealLinda D Pacer, female   DOB: 12-19-1990, 26 y.o.    MRN: [id number]

## 2017-01-25 NOTE — Progress Notes (Signed)
Patient staes she feels wet at times. She does have some pressure.

## 2017-01-28 ENCOUNTER — Other Ambulatory Visit: Payer: Self-pay | Admitting: Obstetrics and Gynecology

## 2017-01-28 ENCOUNTER — Encounter (HOSPITAL_COMMUNITY): Payer: Self-pay

## 2017-01-28 ENCOUNTER — Ambulatory Visit (HOSPITAL_COMMUNITY)
Admission: RE | Admit: 2017-01-28 | Discharge: 2017-01-28 | Disposition: A | Discharge status: Home / Self Care | Payer: Medicaid Other | Source: Ambulatory Visit | Attending: Obstetrics and Gynecology | Admitting: Obstetrics and Gynecology

## 2017-01-28 DIAGNOSIS — Z3A20 20 weeks gestation of pregnancy: Secondary | ICD-10-CM | POA: Diagnosis not present

## 2017-01-28 DIAGNOSIS — Z3689 Encounter for other specified antenatal screening: Secondary | ICD-10-CM

## 2017-01-28 DIAGNOSIS — O0990 Supervision of high risk pregnancy, unspecified, unspecified trimester: Secondary | ICD-10-CM

## 2017-01-28 DIAGNOSIS — O09899 Supervision of other high risk pregnancies, unspecified trimester: Secondary | ICD-10-CM

## 2017-01-28 DIAGNOSIS — O09212 Supervision of pregnancy with history of pre-term labor, second trimester: Secondary | ICD-10-CM | POA: Insufficient documentation

## 2017-01-28 DIAGNOSIS — O09219 Supervision of pregnancy with history of pre-term labor, unspecified trimester: Secondary | ICD-10-CM

## 2017-01-28 DIAGNOSIS — Z363 Encounter for antenatal screening for malformations: Secondary | ICD-10-CM | POA: Insufficient documentation

## 2017-01-29 ENCOUNTER — Other Ambulatory Visit (HOSPITAL_COMMUNITY): Payer: Self-pay | Admitting: *Deleted

## 2017-01-29 DIAGNOSIS — O09219 Supervision of pregnancy with history of pre-term labor, unspecified trimester: Secondary | ICD-10-CM

## 2017-02-01 ENCOUNTER — Ambulatory Visit (INDEPENDENT_AMBULATORY_CARE_PROVIDER_SITE_OTHER): Payer: Medicaid Other

## 2017-02-01 DIAGNOSIS — O09212 Supervision of pregnancy with history of pre-term labor, second trimester: Secondary | ICD-10-CM

## 2017-02-01 DIAGNOSIS — O09219 Supervision of pregnancy with history of pre-term labor, unspecified trimester: Secondary | ICD-10-CM

## 2017-02-01 MED ORDER — HYDROXYPROGESTERONE CAPROATE 250 MG/ML IM OIL
250.0000 mg | TOPICAL_OIL | Freq: Once | INTRAMUSCULAR | Status: AC
  Administered 2017-02-01: 250 mg via INTRAMUSCULAR

## 2017-02-01 NOTE — Progress Notes (Signed)
Nurse visit for 17p. Given w/o difficulty Right Upper Outer Quad.

## 2017-02-04 ENCOUNTER — Telehealth: Payer: Self-pay

## 2017-02-04 NOTE — Telephone Encounter (Signed)
error 

## 2017-02-08 ENCOUNTER — Ambulatory Visit: Payer: Medicaid Other

## 2017-02-08 ENCOUNTER — Ambulatory Visit: Payer: Self-pay

## 2017-02-08 DIAGNOSIS — O09219 Supervision of pregnancy with history of pre-term labor, unspecified trimester: Secondary | ICD-10-CM

## 2017-02-08 MED ORDER — HYDROXYPROGESTERONE CAPROATE 250 MG/ML IM OIL
250.0000 mg | TOPICAL_OIL | Freq: Once | INTRAMUSCULAR | Status: AC
  Administered 2017-05-20: 250 mg via INTRAMUSCULAR

## 2017-02-08 NOTE — Progress Notes (Addendum)
Patient presents for 17P injection. [place] Patient tolerated well.. Administrations This Visit    hydroxyprogesterone caproate (MAKENA) 250 mg/mL injection 250 mg    Admin Date 02/08/2017 Action Given Dose 250 mg Route Intramuscular Administered By Maretta Beesarol J Loisann Roach, RMA

## 2017-02-11 ENCOUNTER — Encounter (HOSPITAL_COMMUNITY): Payer: Self-pay

## 2017-02-11 ENCOUNTER — Ambulatory Visit (HOSPITAL_COMMUNITY)
Admission: RE | Admit: 2017-02-11 | Discharge: 2017-02-11 | Disposition: A | Discharge status: Home / Self Care | Payer: Medicaid Other | Source: Ambulatory Visit | Attending: Obstetrics and Gynecology | Admitting: Obstetrics and Gynecology

## 2017-02-15 ENCOUNTER — Ambulatory Visit: Payer: Medicaid Other | Admitting: Obstetrics and Gynecology

## 2017-02-15 ENCOUNTER — Ambulatory Visit: Payer: Self-pay

## 2017-02-15 DIAGNOSIS — O09219 Supervision of pregnancy with history of pre-term labor, unspecified trimester: Secondary | ICD-10-CM

## 2017-02-15 NOTE — Progress Notes (Signed)
Patient presents for 17P Injection. Injection given in LUOQ, tolerated well.  Administrations This Visit    hydroxyprogesterone caproate (MAKENA) 250 mg/mL injection 250 mg    Admin Date 02/15/2017 Action Given Dose 250 mg Route Intramuscular Administered By Maretta Beesarol J Destine Zirkle, RMA         Patient told us she would be in [place] 3 weeks starting 02/18/2017. She asked about missing her injections, as her husband wont be able to do it. She said that she would find a doctor in [place].

## 2017-02-15 NOTE — Progress Notes (Signed)
Patient not scheduled to see MD today. Follow up as scheduled for ROB next week. This was a 17-P injection only visit

## 2017-02-22 ENCOUNTER — Ambulatory Visit (INDEPENDENT_AMBULATORY_CARE_PROVIDER_SITE_OTHER): Payer: Medicaid Other | Admitting: Obstetrics and Gynecology

## 2017-02-22 ENCOUNTER — Ambulatory Visit (HOSPITAL_COMMUNITY)
Admission: RE | Admit: 2017-02-22 | Discharge: 2017-02-22 | Disposition: A | Discharge status: Home / Self Care | Payer: Medicaid Other | Source: Ambulatory Visit | Attending: Obstetrics and Gynecology | Admitting: Obstetrics and Gynecology

## 2017-02-22 VITALS — BP 93/60 | HR 96 | Wt 131.5 lb

## 2017-02-22 DIAGNOSIS — O0990 Supervision of high risk pregnancy, unspecified, unspecified trimester: Secondary | ICD-10-CM

## 2017-02-22 DIAGNOSIS — O09212 Supervision of pregnancy with history of pre-term labor, second trimester: Secondary | ICD-10-CM | POA: Diagnosis not present

## 2017-02-22 DIAGNOSIS — O09219 Supervision of pregnancy with history of pre-term labor, unspecified trimester: Secondary | ICD-10-CM

## 2017-02-22 NOTE — Progress Notes (Signed)
   PRENATAL VISIT NOTE  Subjective:  Maria Riley is a 26 y.o. [phone] at 285w1d being seen today for ongoing prenatal care.  She is currently monitored for the following issues for this high-risk pregnancy and has Supervision of high risk pregnancy, antepartum and Previous preterm delivery, antepartum on her problem list.  Patient reports no complaints.  Contractions: Not present. Vag. Bleeding: None.  Movement: Present. Denies leaking of fluid.   The following portions of the patient's history were reviewed and updated as appropriate: allergies, current medications, past family history, past medical history, past social history, past surgical history and problem list. Problem list updated.  Objective:   Vitals:   02/22/17 1412  BP: 93/60  Pulse: 96  Weight: 131 lb 8 oz (59.6 kg)    Fetal Status: Fetal Heart Rate (bpm): 144 Fundal Height: 24 cm Movement: Present     General:  Alert, oriented and cooperative. Patient is in no acute distress.  Skin: Skin is warm and dry. No rash noted.   Cardiovascular: Normal heart rate noted  Respiratory: Normal respiratory effort, no problems with respiration noted  Abdomen: Soft, gravid, appropriate for gestational age. Pain/Pressure: Absent     Pelvic:  Cervical exam deferred        Extremities: Normal range of motion.  Edema: None  Mental Status: Normal mood and affect. Normal behavior. Normal judgment and thought content.   Assessment and Plan:  Pregnancy: [id number] at 1185w1d  1. Supervision of high risk pregnancy, antepartum Patient is doing well without complaints Patient had ultrasound done today but report is not available for review at the time of this visit Third trimester labs and glucola at next visit  2. Previous preterm delivery, antepartum Continue weekly 17-P  Preterm labor symptoms and general obstetric precautions including but not limited to vaginal bleeding, contractions, leaking of fluid and fetal movement were reviewed  in detail with the patient. Please refer to After Visit Summary for other counseling recommendations.  Return in about 4 weeks (around 03/22/2017) for ROB, 2 hr glucola next visit.   Catalina AntiguaPeggy Selim Durden, MD

## 2017-02-22 NOTE — Progress Notes (Signed)
Patient presents for ROB and 17P Injection. Given in RUOQ. Tolerated well.  Administrations This Visit    hydroxyprogesterone caproate (MAKENA) 250 mg/mL injection 250 mg    Admin Date 02/22/2017 Action Given Dose 250 mg Route Intramuscular Administered By Maretta Beesarol J Vicki Pasqual, RMA

## 2017-02-26 ENCOUNTER — Ambulatory Visit (HOSPITAL_COMMUNITY)
Admission: RE | Admit: 2017-02-26 | Discharge: 2017-02-26 | Disposition: A | Discharge status: Home / Self Care | Payer: Medicaid Other | Source: Ambulatory Visit | Attending: Obstetrics and Gynecology | Admitting: Obstetrics and Gynecology

## 2017-02-26 ENCOUNTER — Encounter (HOSPITAL_COMMUNITY): Payer: Self-pay

## 2017-02-26 ENCOUNTER — Other Ambulatory Visit (HOSPITAL_COMMUNITY): Payer: Self-pay | Admitting: Maternal and Fetal Medicine

## 2017-02-26 ENCOUNTER — Other Ambulatory Visit: Payer: Self-pay | Admitting: Obstetrics and Gynecology

## 2017-02-26 DIAGNOSIS — O0990 Supervision of high risk pregnancy, unspecified, unspecified trimester: Secondary | ICD-10-CM

## 2017-02-26 DIAGNOSIS — Z3A30 30 weeks gestation of pregnancy: Secondary | ICD-10-CM

## 2017-02-26 DIAGNOSIS — O09213 Supervision of pregnancy with history of pre-term labor, third trimester: Secondary | ICD-10-CM

## 2017-02-26 DIAGNOSIS — O4442 Low lying placenta NOS or without hemorrhage, second trimester: Secondary | ICD-10-CM | POA: Diagnosis not present

## 2017-02-26 DIAGNOSIS — Z3A24 24 weeks gestation of pregnancy: Secondary | ICD-10-CM | POA: Insufficient documentation

## 2017-02-26 DIAGNOSIS — IMO0002 Reserved for concepts with insufficient information to code with codable children: Secondary | ICD-10-CM

## 2017-02-26 DIAGNOSIS — Z362 Encounter for other antenatal screening follow-up: Secondary | ICD-10-CM | POA: Diagnosis present

## 2017-02-26 DIAGNOSIS — O09219 Supervision of pregnancy with history of pre-term labor, unspecified trimester: Secondary | ICD-10-CM

## 2017-02-26 DIAGNOSIS — Z0489 Encounter for examination and observation for other specified reasons: Secondary | ICD-10-CM

## 2017-02-26 DIAGNOSIS — O09212 Supervision of pregnancy with history of pre-term labor, second trimester: Secondary | ICD-10-CM | POA: Diagnosis not present

## 2017-03-01 ENCOUNTER — Ambulatory Visit (INDEPENDENT_AMBULATORY_CARE_PROVIDER_SITE_OTHER): Payer: Medicaid Other | Admitting: *Deleted

## 2017-03-01 VITALS — BP 98/62 | HR 98

## 2017-03-01 DIAGNOSIS — O09212 Supervision of pregnancy with history of pre-term labor, second trimester: Secondary | ICD-10-CM

## 2017-03-01 DIAGNOSIS — O0990 Supervision of high risk pregnancy, unspecified, unspecified trimester: Secondary | ICD-10-CM

## 2017-03-01 NOTE — Progress Notes (Signed)
Pt is in office for 17p injection. Pt tolerated injection well. Pt has no other concerns today.  Administrations This Visit    hydroxyprogesterone caproate (MAKENA) 250 mg/mL injection 250 mg    Admin Date 03/01/2017 Action Given Dose 250 mg Route Intramuscular Administered By Lanney GinsSuzanne D Foster, CMA

## 2017-03-08 ENCOUNTER — Ambulatory Visit: Payer: Self-pay

## 2017-03-09 ENCOUNTER — Ambulatory Visit: Payer: Medicaid Other

## 2017-03-09 DIAGNOSIS — O0990 Supervision of high risk pregnancy, unspecified, unspecified trimester: Secondary | ICD-10-CM

## 2017-03-09 NOTE — Progress Notes (Signed)
Nurse visit for 17p given w/o difficulty R upper outer quad. Next ROB scheduled on Monday. Pt states no other concerns today.

## 2017-03-16 ENCOUNTER — Ambulatory Visit (INDEPENDENT_AMBULATORY_CARE_PROVIDER_SITE_OTHER): Payer: Medicaid Other

## 2017-03-16 DIAGNOSIS — O09212 Supervision of pregnancy with history of pre-term labor, second trimester: Secondary | ICD-10-CM

## 2017-03-16 DIAGNOSIS — O09219 Supervision of pregnancy with history of pre-term labor, unspecified trimester: Secondary | ICD-10-CM

## 2017-03-16 NOTE — Progress Notes (Signed)
17p given L upper outer quad w/o difficulty. Next ROB 4/16

## 2017-03-18 ENCOUNTER — Ambulatory Visit (HOSPITAL_COMMUNITY): Payer: Medicaid Other

## 2017-03-22 ENCOUNTER — Encounter: Payer: Self-pay | Admitting: Obstetrics

## 2017-03-22 ENCOUNTER — Other Ambulatory Visit: Payer: Medicaid Other

## 2017-03-22 ENCOUNTER — Ambulatory Visit (INDEPENDENT_AMBULATORY_CARE_PROVIDER_SITE_OTHER): Payer: Medicaid Other | Admitting: Obstetrics

## 2017-03-22 VITALS — BP 107/67 | HR 85 | Wt 136.8 lb

## 2017-03-22 DIAGNOSIS — O09219 Supervision of pregnancy with history of pre-term labor, unspecified trimester: Secondary | ICD-10-CM

## 2017-03-22 DIAGNOSIS — O0990 Supervision of high risk pregnancy, unspecified, unspecified trimester: Secondary | ICD-10-CM

## 2017-03-22 DIAGNOSIS — O09213 Supervision of pregnancy with history of pre-term labor, third trimester: Secondary | ICD-10-CM | POA: Diagnosis not present

## 2017-03-22 DIAGNOSIS — O09899 Supervision of other high risk pregnancies, unspecified trimester: Secondary | ICD-10-CM

## 2017-03-22 NOTE — Addendum Note (Signed)
Addended by: Marya Landry D on: 03/22/2017 03:52 PM   Modules accepted: Orders

## 2017-03-22 NOTE — Progress Notes (Signed)
Interpreter GE#[id number]

## 2017-03-22 NOTE — Progress Notes (Signed)
Subjective:  Maria Riley is a 26 y.o. [phone] at [redacted]w[redacted]d being seen today for ongoing prenatal care.  She is currently monitored for the following issues for this high-risk pregnancy and has Supervision of high risk pregnancy, antepartum and Previous preterm delivery, antepartum on her problem list.  Patient reports inguinal pain, no cramping.  Contractions: Not present. Vag. Bleeding: None.  Movement: Present. Denies leaking of fluid.   The following portions of the patient's history were reviewed and updated as appropriate: allergies, current medications, past family history, past medical history, past social history, past surgical history and problem list. Problem list updated.  Objective:   Vitals:   03/22/17 0920  BP: 107/67  Pulse: 85  Weight: 136 lb 12.8 oz (62.1 kg)    Fetal Status: Fetal Heart Rate (bpm): 140   Movement: Present     General:  Alert, oriented and cooperative. Patient is in no acute distress.  Skin: Skin is warm and dry. No rash noted.   Cardiovascular: Normal heart rate noted  Respiratory: Normal respiratory effort, no problems with respiration noted  Abdomen: Soft, gravid, appropriate for gestational age. Pain/Pressure: Present     Pelvic:  Cervical exam deferred        Extremities: Normal range of motion.     Mental Status: Normal mood and affect. Normal behavior. Normal judgment and thought content.   Urinalysis:      Assessment and Plan:  Pregnancy: [id number] at [redacted]w[redacted]d  There are no diagnoses linked to this encounter. Preterm labor symptoms and general obstetric precautions including but not limited to vaginal bleeding, contractions, leaking of fluid and fetal movement were reviewed in detail with the patient. Please refer to After Visit Summary for other counseling recommendations.  Return in 1 week (on 03/29/2017).   Brock Bad, MDPatient ID: [name], female   DOB: 10/06/1991, 26 y.o.   MRN: [id number]

## 2017-03-23 LAB — HEMOGLOBIN A1C
ESTIMATED AVERAGE GLUCOSE: 88 mg/dL
HEMOGLOBIN A1C: 4.7 % — AB (ref 4.8–5.6)

## 2017-03-23 LAB — HIV ANTIBODY (ROUTINE TESTING W REFLEX): HIV Screen 4th Generation wRfx: NONREACTIVE

## 2017-03-23 LAB — CBC
HEMATOCRIT: 35.6 % (ref 34.0–46.6)
HEMOGLOBIN: 11.9 g/dL (ref 11.1–15.9)
MCH: 29 pg (ref 26.6–33.0)
MCHC: 33.4 g/dL (ref 31.5–35.7)
MCV: 87 fL (ref 79–97)
Platelets: 196 10*3/uL (ref 150–379)
RBC: 4.11 x10E6/uL (ref 3.77–5.28)
RDW: 14.1 % (ref 12.3–15.4)
WBC: 6.5 10*3/uL (ref 3.4–10.8)

## 2017-03-23 LAB — GLUCOSE, FASTING: GLUCOSE, PLASMA: 70 mg/dL (ref 65–99)

## 2017-03-23 LAB — RPR: RPR Ser Ql: NONREACTIVE

## 2017-03-31 ENCOUNTER — Ambulatory Visit (INDEPENDENT_AMBULATORY_CARE_PROVIDER_SITE_OTHER): Payer: Medicaid Other

## 2017-03-31 VITALS — BP 94/62 | HR 94 | Wt 137.1 lb

## 2017-03-31 DIAGNOSIS — O09213 Supervision of pregnancy with history of pre-term labor, third trimester: Secondary | ICD-10-CM | POA: Diagnosis not present

## 2017-03-31 DIAGNOSIS — O0993 Supervision of high risk pregnancy, unspecified, third trimester: Secondary | ICD-10-CM

## 2017-03-31 NOTE — Progress Notes (Signed)
PATIENT RECEIVED 17-P INJECTION. TOLERATED WELL IN LUOQ

## 2017-04-05 ENCOUNTER — Ambulatory Visit (INDEPENDENT_AMBULATORY_CARE_PROVIDER_SITE_OTHER): Payer: Medicaid Other | Admitting: Obstetrics

## 2017-04-05 ENCOUNTER — Encounter: Payer: Self-pay | Admitting: Obstetrics

## 2017-04-05 VITALS — BP 101/70 | HR 84 | Wt 136.6 lb

## 2017-04-05 DIAGNOSIS — O09219 Supervision of pregnancy with history of pre-term labor, unspecified trimester: Secondary | ICD-10-CM

## 2017-04-05 DIAGNOSIS — O09213 Supervision of pregnancy with history of pre-term labor, third trimester: Secondary | ICD-10-CM

## 2017-04-05 DIAGNOSIS — O09893 Supervision of other high risk pregnancies, third trimester: Secondary | ICD-10-CM | POA: Diagnosis not present

## 2017-04-05 DIAGNOSIS — O09899 Supervision of other high risk pregnancies, unspecified trimester: Secondary | ICD-10-CM

## 2017-04-05 DIAGNOSIS — O0993 Supervision of high risk pregnancy, unspecified, third trimester: Secondary | ICD-10-CM

## 2017-04-05 NOTE — Progress Notes (Signed)
Subjective:  Maria Riley is a 26 y.o. [phone] at [redacted]w[redacted]d being seen today for ongoing prenatal care.  She is currently monitored for the following issues for this high-risk pregnancy and has Supervision of high risk pregnancy, antepartum and Previous preterm delivery, antepartum on her problem list.  Patient reports backache and pressure.  Contractions: Not present. Vag. Bleeding: None.  Movement: Present. Denies leaking of fluid.   The following portions of the patient's history were reviewed and updated as appropriate: allergies, current medications, past family history, past medical history, past social history, past surgical history and problem list. Problem list updated.  Objective:   Vitals:   04/05/17 1523  BP: 101/70  Pulse: 84  Weight: 136 lb 9.6 oz (62 kg)    Fetal Status: Fetal Heart Rate (bpm): 140   Movement: Present     General:  Alert, oriented and cooperative. Patient is in no acute distress.  Skin: Skin is warm and dry. No rash noted.   Cardiovascular: Normal heart rate noted  Respiratory: Normal respiratory effort, no problems with respiration noted  Abdomen: Soft, gravid, appropriate for gestational age. Pain/Pressure: Present     Pelvic:  Cervical exam deferred        Extremities: Normal range of motion.     Mental Status: Normal mood and affect. Normal behavior. Normal judgment and thought content.   Urinalysis:      Assessment and Plan:  Pregnancy: [id number] at [redacted]w[redacted]d  1. Supervision of high risk pregnancy in third trimester  2. Backache and Pressure Rx: - Maternity Belt   Preterm labor symptoms and general obstetric precautions including but not limited to vaginal bleeding, contractions, leaking of fluid and fetal movement were reviewed in detail with the patient. Please refer to After Visit Summary for other counseling recommendations.  Return in 1 week (on 04/12/2017).   Brock Bad, MDPatient ID: [name], female   DOB: [birth year], 26 y.o.    MRN: [id number]

## 2017-04-09 ENCOUNTER — Ambulatory Visit (HOSPITAL_COMMUNITY)
Admission: RE | Admit: 2017-04-09 | Discharge: 2017-04-09 | Disposition: A | Discharge status: Home / Self Care | Payer: Medicaid Other | Source: Ambulatory Visit | Attending: Obstetrics | Admitting: Obstetrics

## 2017-04-09 ENCOUNTER — Encounter (HOSPITAL_COMMUNITY): Payer: Self-pay

## 2017-04-09 ENCOUNTER — Other Ambulatory Visit (HOSPITAL_COMMUNITY): Payer: Self-pay | Admitting: Maternal and Fetal Medicine

## 2017-04-09 DIAGNOSIS — O0990 Supervision of high risk pregnancy, unspecified, unspecified trimester: Secondary | ICD-10-CM

## 2017-04-09 DIAGNOSIS — Z362 Encounter for other antenatal screening follow-up: Secondary | ICD-10-CM

## 2017-04-09 DIAGNOSIS — O4440 Low lying placenta NOS or without hemorrhage, unspecified trimester: Secondary | ICD-10-CM

## 2017-04-09 DIAGNOSIS — O09213 Supervision of pregnancy with history of pre-term labor, third trimester: Secondary | ICD-10-CM

## 2017-04-09 DIAGNOSIS — Z3A30 30 weeks gestation of pregnancy: Secondary | ICD-10-CM

## 2017-04-09 DIAGNOSIS — O09219 Supervision of pregnancy with history of pre-term labor, unspecified trimester: Secondary | ICD-10-CM

## 2017-04-09 NOTE — Addendum Note (Signed)
Encounter addended by: Vivien Rotaachael H Todd Argabright, RT on: 04/09/2017  4:02 PM<BR>    Actions taken: Imaging Exam ended

## 2017-04-15 ENCOUNTER — Encounter: Payer: Self-pay | Admitting: *Deleted

## 2017-04-15 ENCOUNTER — Other Ambulatory Visit (HOSPITAL_COMMUNITY)
Admission: RE | Admit: 2017-04-15 | Discharge: 2017-04-15 | Disposition: A | Discharge status: Home / Self Care | Payer: Medicaid Other | Source: Ambulatory Visit | Attending: Certified Nurse Midwife | Admitting: Certified Nurse Midwife

## 2017-04-15 ENCOUNTER — Ambulatory Visit (INDEPENDENT_AMBULATORY_CARE_PROVIDER_SITE_OTHER): Payer: Medicaid Other | Admitting: Certified Nurse Midwife

## 2017-04-15 VITALS — BP 94/60 | HR 98 | Wt 143.0 lb

## 2017-04-15 DIAGNOSIS — B373 Candidiasis of vulva and vagina: Secondary | ICD-10-CM | POA: Diagnosis not present

## 2017-04-15 DIAGNOSIS — Z3A00 Weeks of gestation of pregnancy not specified: Secondary | ICD-10-CM | POA: Insufficient documentation

## 2017-04-15 DIAGNOSIS — O4703 False labor before 37 completed weeks of gestation, third trimester: Secondary | ICD-10-CM | POA: Diagnosis not present

## 2017-04-15 DIAGNOSIS — O09219 Supervision of pregnancy with history of pre-term labor, unspecified trimester: Secondary | ICD-10-CM

## 2017-04-15 DIAGNOSIS — O98819 Other maternal infectious and parasitic diseases complicating pregnancy, unspecified trimester: Secondary | ICD-10-CM | POA: Diagnosis not present

## 2017-04-15 DIAGNOSIS — O0990 Supervision of high risk pregnancy, unspecified, unspecified trimester: Secondary | ICD-10-CM | POA: Diagnosis not present

## 2017-04-15 DIAGNOSIS — O09213 Supervision of pregnancy with history of pre-term labor, third trimester: Secondary | ICD-10-CM | POA: Diagnosis not present

## 2017-04-15 DIAGNOSIS — O0993 Supervision of high risk pregnancy, unspecified, third trimester: Secondary | ICD-10-CM | POA: Diagnosis not present

## 2017-04-15 NOTE — Progress Notes (Signed)
Pt is having pain lower left abdomen and back area. She is currently having some vaginal pain and pressure.

## 2017-04-16 ENCOUNTER — Encounter: Payer: Self-pay | Admitting: Certified Nurse Midwife

## 2017-04-16 LAB — FETAL FIBRONECTIN: FETAL FIBRONECTIN: NEGATIVE

## 2017-04-16 MED ORDER — NIFEDIPINE ER OSMOTIC RELEASE 30 MG PO TB24: 30.0000 mg | ORAL_TABLET | Freq: Two times a day (BID) | ORAL | 2 refills | Status: DC

## 2017-04-16 NOTE — Progress Notes (Addendum)
   PRENATAL VISIT NOTE  Subjective:  Maria Riley is a 26 y.o. [phone] at 6061w5d being seen today for ongoing prenatal care.  She is currently monitored for the following issues for this high-risk pregnancy and has Supervision of high risk pregnancy, antepartum and Previous preterm delivery, antepartum on her problem list.  Patient reports backache, contractions since last week, no bleeding and no leaking.  Contractions: Not present. Vag. Bleeding: None.  Movement: Present. Denies leaking of fluid.   The following portions of the patient's history were reviewed and updated as appropriate: allergies, current medications, past family history, past medical history, past social history, past surgical history and problem list. Problem list updated.  Objective:   Vitals:   04/15/17 1609  BP: 94/60  Pulse: 98  Weight: 143 lb (64.9 kg)    Fetal Status: Fetal Heart Rate (bpm): 152 Fundal Height: 31 cm Movement: Present     General:  Alert, oriented and cooperative. Patient is in no acute distress.  Skin: Skin is warm and dry. No rash noted.   Cardiovascular: Normal heart rate noted  Respiratory: Normal respiratory effort, no problems with respiration noted  Abdomen: Soft, gravid, appropriate for gestational age. Pain/Pressure: Present     Pelvic:  Cervical exam performed Dilation: Closed Effacement (%): 50 Station: BallotableOuter OS 2 cm, soft, posterior.  Inner OS closed  Extremities: Normal range of motion.  Edema: None  Mental Status: Normal mood and affect. Normal behavior. Normal judgment and thought content.    FFN: negative   NST: + accels, no decels, moderate variability, Cat. 1 tracing. Uterine irritability noted on toco every 1-3 minutes.   Assessment and Plan:  Pregnancy: [id number] at 6161w5d  1. Supervision of high risk pregnancy, antepartum     Reactive NST.  - Cervicovaginal ancillary only - Fetal fibronectin  2. Previous preterm delivery, antepartum     On 17-P -  Cervicovaginal ancillary only - Fetal fibronectin  3. Preterm uterine contractions in third trimester, antepartum     POC discussed with Dr. Jolayne Pantheronstant - NIFEdipine (PROCARDIA-XL/ADALAT-CC/NIFEDICAL-XL) 30 MG 24 hr tablet; Take 1 tablet (30 mg total) by mouth 2 (two) times daily.  Dispense: 60 tablet; Refill: 2  Preterm labor symptoms and general obstetric precautions including but not limited to vaginal bleeding, contractions, leaking of fluid and fetal movement were reviewed in detail with the patient. Please refer to After Visit Summary for other counseling recommendations.  [facility] in about 2 weeks (around 04/29/2017) [facility].   Roe Coombsenney, Tynleigh Birt A, CNM

## 2017-04-19 LAB — CERVICOVAGINAL ANCILLARY ONLY
Bacterial vaginitis: NEGATIVE
CANDIDA VAGINITIS: POSITIVE — AB
Chlamydia: NEGATIVE
Neisseria Gonorrhea: NEGATIVE
Trichomonas: NEGATIVE

## 2017-04-20 ENCOUNTER — Other Ambulatory Visit: Payer: Self-pay | Admitting: Certified Nurse Midwife

## 2017-04-20 DIAGNOSIS — B3731 Acute candidiasis of vulva and vagina: Secondary | ICD-10-CM

## 2017-04-20 DIAGNOSIS — B373 Candidiasis of vulva and vagina: Secondary | ICD-10-CM

## 2017-04-20 MED ORDER — FLUCONAZOLE 150 MG PO TABS: 150.0000 mg | ORAL_TABLET | Freq: Once | ORAL | 0 refills | Status: AC

## 2017-04-20 MED ORDER — TERCONAZOLE 0.8 % VA CREA: 1.0000 | TOPICAL_CREAM | Freq: Every day | VAGINAL | 0 refills | Status: DC

## 2017-04-23 ENCOUNTER — Ambulatory Visit (INDEPENDENT_AMBULATORY_CARE_PROVIDER_SITE_OTHER): Payer: Medicaid Other

## 2017-04-23 VITALS — BP 98/63 | HR 83

## 2017-04-23 DIAGNOSIS — O09219 Supervision of pregnancy with history of pre-term labor, unspecified trimester: Secondary | ICD-10-CM

## 2017-04-23 NOTE — Progress Notes (Signed)
Patient is in the office for 17-p injection, administered and pt tolerated well.

## 2017-04-29 ENCOUNTER — Ambulatory Visit (INDEPENDENT_AMBULATORY_CARE_PROVIDER_SITE_OTHER): Payer: Medicaid Other | Admitting: Obstetrics & Gynecology

## 2017-04-29 ENCOUNTER — Encounter: Payer: Self-pay | Admitting: Obstetrics & Gynecology

## 2017-04-29 DIAGNOSIS — O0990 Supervision of high risk pregnancy, unspecified, unspecified trimester: Secondary | ICD-10-CM

## 2017-04-29 DIAGNOSIS — O0993 Supervision of high risk pregnancy, unspecified, third trimester: Secondary | ICD-10-CM

## 2017-04-29 DIAGNOSIS — O09213 Supervision of pregnancy with history of pre-term labor, third trimester: Secondary | ICD-10-CM

## 2017-04-29 NOTE — Patient Instructions (Signed)
Preterm Labor and Birth Information °Pregnancy normally lasts 39-41 weeks. Preterm labor is when labor starts early. It starts before you have been pregnant for 37 whole weeks. °What are the risk factors for preterm labor? °Preterm labor is more likely to occur in women who: °· Have an infection while pregnant. °· Have a cervix that is short. °· Have gone into preterm labor before. °· Have had surgery on their cervix. °· Are younger than age 25. °· Are older than age 35. °· Are African American. °· Are pregnant with two or more babies. °· Take street drugs while pregnant. °· Smoke while pregnant. °· Do not gain enough weight while pregnant. °· Got pregnant right after another pregnancy. °What are the symptoms of preterm labor? °Symptoms of preterm labor include: °· Cramps. The cramps may feel like the cramps some women get during their period. The cramps may happen with watery poop (diarrhea). °· Pain in the belly (abdomen). °· Pain in the lower back. °· Regular contractions or tightening. It may feel like your belly is getting tighter. °· Pressure in the lower belly that seems to get stronger. °· More fluid (discharge) leaking from the vagina. The fluid may be watery or bloody. °· Water breaking. °Why is it important to notice signs of preterm labor? °Babies who are born early may not be fully developed. They have a higher chance for: °· Long-term heart problems. °· Long-term lung problems. °· Trouble controlling body systems, like breathing. °· Bleeding in the brain. °· A condition called cerebral palsy. °· Learning difficulties. °· Death. °These risks are highest for babies who are born before 34 weeks of pregnancy. °How is preterm labor treated? °Treatment depends on: °· How long you were pregnant. °· Your condition. °· The health of your baby. °Treatment may involve: °· Having a stitch (suture) placed in your cervix. When you give birth, your cervix opens so the baby can come out. The stitch keeps the cervix  from opening too soon. °· Staying at the hospital. °· Taking or getting medicines, such as: °¨ Hormone medicines. °¨ Medicines to stop contractions. °¨ Medicines to help the baby’s lungs develop. °¨ Medicines to prevent your baby from having cerebral palsy. °What should I do if I am in preterm labor? °If you think you are going into labor too soon, call your doctor right away. °How can I prevent preterm labor? °· Do not use any tobacco products. °¨ Examples of these are cigarettes, chewing tobacco, and e-cigarettes. °¨ If you need help quitting, ask your doctor. °· Do not use street drugs. °· Do not use any medicines unless you ask your doctor if they are safe for you. °· Talk with your doctor before taking any herbal supplements. °· Make sure you gain enough weight. °· Watch for infection. If you think you might have an infection, get it checked right away. °· If you have gone into preterm labor before, tell your doctor. °This information is not intended to replace advice given to you by your health care provider. Make sure you discuss any questions you have with your health care provider. °Document Released: 02/19/2009 Document Revised: 05/05/2016 Document Reviewed: 04/15/2016 °Elsevier Interactive Patient Education © 2017 [employer] Inc. ° °

## 2017-04-29 NOTE — Progress Notes (Signed)
Patient complains of pain whenever the baby moves too much. 17P given in LUOQ. Tolerated well Administrations This Visit    hydroxyprogesterone caproate (MAKENA) 250 mg/mL injection 250 mg    Admin Date 04/29/2017 Action Given Dose 250 mg Route Intramuscular Administered By Maretta BeesMcGlashan, Obi Scrima J, RMA

## 2017-04-29 NOTE — Progress Notes (Signed)
   PRENATAL VISIT NOTE  Subjective:  Maria Riley is a 26 y.o. [phone] at 3746w4d being seen today for ongoing prenatal care.  She is currently monitored for the following issues for this high-risk pregnancy and has Supervision of high risk pregnancy, antepartum and Previous preterm delivery, antepartum on her problem list.  Patient reports occasional contractions.  Contractions: Not present. Vag. Bleeding: None.  Movement: Present. Denies leaking of fluid.   The following portions of the patient's history were reviewed and updated as appropriate: allergies, current medications, past family history, past medical history, past social history, past surgical history and problem list. Problem list updated.  Objective:   Vitals:   04/29/17 1334  BP: 94/63  Pulse: 89  Weight: 145 lb (65.8 kg)    Fetal Status: Fetal Heart Rate (bpm): 144   Movement: Present     General:  Alert, oriented and cooperative. Patient is in no acute distress.  Skin: Skin is warm and dry. No rash noted.   Cardiovascular: Normal heart rate noted  Respiratory: Normal respiratory effort, no problems with respiration noted  Abdomen: Soft, gravid, appropriate for gestational age. Pain/Pressure: Present     Pelvic:  Cervical exam performed        Extremities: Normal range of motion.  Edema: Trace  Mental Status: Normal mood and affect. Normal behavior. Normal judgment and thought content.   Assessment and Plan:  Pregnancy: [id number] at 2046w4d  1. Supervision of high risk pregnancy, antepartum 17 P  Preterm labor symptoms and general obstetric precautions including but not limited to vaginal bleeding, contractions, leaking of fluid and fetal movement were reviewed in detail with the patient. Please refer to After Visit Summary for other counseling recommendations.  Return in about 2 weeks (around 05/13/2017).   Scheryl DarterJames Kenedi Cilia, MD

## 2017-05-06 ENCOUNTER — Ambulatory Visit (INDEPENDENT_AMBULATORY_CARE_PROVIDER_SITE_OTHER): Payer: Medicaid Other

## 2017-05-06 VITALS — BP 89/56 | HR 89 | Wt 147.0 lb

## 2017-05-06 DIAGNOSIS — O09213 Supervision of pregnancy with history of pre-term labor, third trimester: Secondary | ICD-10-CM

## 2017-05-06 DIAGNOSIS — O0990 Supervision of high risk pregnancy, unspecified, unspecified trimester: Secondary | ICD-10-CM

## 2017-05-06 NOTE — Progress Notes (Signed)
Patient presents for 17P Injection. Given in RUOQ. Tolerated well. Administrations This Visit    hydroxyprogesterone caproate (MAKENA) 250 mg/mL injection 250 mg    Admin Date 05/06/2017 Action Given Dose 250 mg Route Intramuscular Administered By Maretta BeesMcGlashan, Carol J, RMA

## 2017-05-13 ENCOUNTER — Encounter: Payer: Self-pay | Admitting: Obstetrics & Gynecology

## 2017-05-13 ENCOUNTER — Ambulatory Visit (INDEPENDENT_AMBULATORY_CARE_PROVIDER_SITE_OTHER): Payer: Medicaid Other

## 2017-05-13 DIAGNOSIS — O09213 Supervision of pregnancy with history of pre-term labor, third trimester: Secondary | ICD-10-CM | POA: Diagnosis not present

## 2017-05-13 DIAGNOSIS — O09219 Supervision of pregnancy with history of pre-term labor, unspecified trimester: Secondary | ICD-10-CM

## 2017-05-13 NOTE — Progress Notes (Signed)
Administered 17-p and pt tolerated well .Marland Kitchen. Administrations This Visit    hydroxyprogesterone caproate (MAKENA) 250 mg/mL injection 250 mg    Admin Date 05/13/2017 Action Given Dose 250 mg Route Intramuscular Administered By Katrina StackStalling, Eadie Repetto D, RN

## 2017-05-20 ENCOUNTER — Inpatient Hospital Stay (HOSPITAL_COMMUNITY)
Admission: AD | Admit: 2017-05-20 | Discharge: 2017-05-23 | DRG: 775 | Disposition: A | Discharge status: Home / Self Care | Payer: Medicaid Other | Source: Ambulatory Visit | Attending: Obstetrics and Gynecology | Admitting: Obstetrics and Gynecology

## 2017-05-20 ENCOUNTER — Encounter (HOSPITAL_COMMUNITY): Payer: Self-pay

## 2017-05-20 ENCOUNTER — Inpatient Hospital Stay (HOSPITAL_COMMUNITY): Payer: Medicaid Other | Admitting: Anesthesiology

## 2017-05-20 ENCOUNTER — Ambulatory Visit (INDEPENDENT_AMBULATORY_CARE_PROVIDER_SITE_OTHER): Payer: Medicaid Other | Admitting: Obstetrics and Gynecology

## 2017-05-20 ENCOUNTER — Other Ambulatory Visit (HOSPITAL_COMMUNITY)
Admission: RE | Admit: 2017-05-20 | Discharge: 2017-05-20 | Disposition: A | Discharge status: Home / Self Care | Payer: Medicaid Other | Source: Ambulatory Visit | Attending: Obstetrics and Gynecology | Admitting: Obstetrics and Gynecology

## 2017-05-20 VITALS — BP 100/64 | HR 80 | Wt 149.0 lb

## 2017-05-20 DIAGNOSIS — O0993 Supervision of high risk pregnancy, unspecified, third trimester: Secondary | ICD-10-CM

## 2017-05-20 DIAGNOSIS — O09219 Supervision of pregnancy with history of pre-term labor, unspecified trimester: Secondary | ICD-10-CM

## 2017-05-20 DIAGNOSIS — O0990 Supervision of high risk pregnancy, unspecified, unspecified trimester: Secondary | ICD-10-CM

## 2017-05-20 DIAGNOSIS — Z3A00 Weeks of gestation of pregnancy not specified: Secondary | ICD-10-CM | POA: Insufficient documentation

## 2017-05-20 DIAGNOSIS — Z3A36 36 weeks gestation of pregnancy: Secondary | ICD-10-CM

## 2017-05-20 DIAGNOSIS — Z3493 Encounter for supervision of normal pregnancy, unspecified, third trimester: Secondary | ICD-10-CM | POA: Diagnosis present

## 2017-05-20 DIAGNOSIS — O09212 Supervision of pregnancy with history of pre-term labor, second trimester: Secondary | ICD-10-CM

## 2017-05-20 LAB — CBC
HCT: 35.6 % — ABNORMAL LOW (ref 36.0–46.0)
Hemoglobin: 12.2 g/dL (ref 12.0–15.0)
MCH: 29.3 pg (ref 26.0–34.0)
MCHC: 34.3 g/dL (ref 30.0–36.0)
MCV: 85.4 fL (ref 78.0–100.0)
PLATELETS: 182 10*3/uL (ref 150–400)
RBC: 4.17 MIL/uL (ref 3.87–5.11)
RDW: 13.5 % (ref 11.5–15.5)
WBC: 9.9 10*3/uL (ref 4.0–10.5)

## 2017-05-20 LAB — TYPE AND SCREEN
ABO/RH(D): A POS
ANTIBODY SCREEN: NEGATIVE

## 2017-05-20 LAB — ABO/RH: ABO/RH(D): A POS

## 2017-05-20 MED ORDER — DEXTROSE 5 % IV SOLN
5.0000 10*6.[IU] | Freq: Once | INTRAVENOUS | Status: AC
  Administered 2017-05-20: 5 10*6.[IU] via INTRAVENOUS
  Filled 2017-05-20: qty 5

## 2017-05-20 MED ORDER — LIDOCAINE HCL (PF) 1 % IJ SOLN
30.0000 mL | INTRAMUSCULAR | Status: DC | PRN
  Filled 2017-05-20: qty 30

## 2017-05-20 MED ORDER — LACTATED RINGERS IV SOLN
INTRAVENOUS | Status: DC
  Administered 2017-05-20 – 2017-05-21 (×2): via INTRAVENOUS

## 2017-05-20 MED ORDER — ACETAMINOPHEN 325 MG PO TABS
650.0000 mg | ORAL_TABLET | ORAL | Status: DC | PRN
  Administered 2017-05-21: 650 mg via ORAL
  Filled 2017-05-20: qty 2

## 2017-05-20 MED ORDER — LACTATED RINGERS IV SOLN: 500.0000 mL | INTRAVENOUS | Status: DC | PRN

## 2017-05-20 MED ORDER — LIDOCAINE HCL (PF) 1 % IJ SOLN
INTRAMUSCULAR | Status: DC | PRN
  Administered 2017-05-20 (×2): 6 mL via EPIDURAL

## 2017-05-20 MED ORDER — FENTANYL CITRATE (PF) 100 MCG/2ML IJ SOLN: 50.0000 ug | INTRAMUSCULAR | Status: DC | PRN

## 2017-05-20 MED ORDER — BETAMETHASONE SOD PHOS & ACET 6 (3-3) MG/ML IJ SUSP
12.0000 mg | Freq: Once | INTRAMUSCULAR | Status: AC
  Administered 2017-05-20: 12 mg via INTRAMUSCULAR
  Filled 2017-05-20: qty 2

## 2017-05-20 MED ORDER — OXYCODONE-ACETAMINOPHEN 5-325 MG PO TABS: 2.0000 | ORAL_TABLET | ORAL | Status: DC | PRN

## 2017-05-20 MED ORDER — DIPHENHYDRAMINE HCL 50 MG/ML IJ SOLN: 12.5000 mg | INTRAMUSCULAR | Status: DC | PRN

## 2017-05-20 MED ORDER — ONDANSETRON HCL 4 MG/2ML IJ SOLN: 4.0000 mg | Freq: Four times a day (QID) | INTRAMUSCULAR | Status: DC | PRN

## 2017-05-20 MED ORDER — PENICILLIN G POT IN DEXTROSE 60000 UNIT/ML IV SOLN
3.0000 10*6.[IU] | INTRAVENOUS | Status: DC
  Administered 2017-05-21 (×4): 3 10*6.[IU] via INTRAVENOUS
  Filled 2017-05-20 (×7): qty 50

## 2017-05-20 MED ORDER — OXYTOCIN 40 UNITS IN LACTATED RINGERS INFUSION - SIMPLE MED
2.5000 [IU]/h | INTRAVENOUS | Status: DC
  Administered 2017-05-21: 2.5 [IU]/h via INTRAVENOUS
  Filled 2017-05-20: qty 1000

## 2017-05-20 MED ORDER — OXYTOCIN BOLUS FROM INFUSION
500.0000 mL | Freq: Once | INTRAVENOUS | Status: AC
  Administered 2017-05-21: 500 mL via INTRAVENOUS

## 2017-05-20 MED ORDER — PHENYLEPHRINE 40 MCG/ML (10ML) SYRINGE FOR IV PUSH (FOR BLOOD PRESSURE SUPPORT)
80.0000 ug | PREFILLED_SYRINGE | INTRAVENOUS | Status: DC | PRN
  Filled 2017-05-20: qty 5

## 2017-05-20 MED ORDER — OXYCODONE-ACETAMINOPHEN 5-325 MG PO TABS: 1.0000 | ORAL_TABLET | ORAL | Status: DC | PRN

## 2017-05-20 MED ORDER — EPHEDRINE 5 MG/ML INJ
10.0000 mg | INTRAVENOUS | Status: DC | PRN
  Filled 2017-05-20: qty 2

## 2017-05-20 MED ORDER — FLEET ENEMA 7-19 GM/118ML RE ENEM: 1.0000 | ENEMA | RECTAL | Status: DC | PRN

## 2017-05-20 MED ORDER — LACTATED RINGERS IV SOLN
500.0000 mL | Freq: Once | INTRAVENOUS | Status: AC
  Administered 2017-05-20: 500 mL via INTRAVENOUS

## 2017-05-20 MED ORDER — FENTANYL 2.5 MCG/ML BUPIVACAINE 1/10 % EPIDURAL INFUSION (WH - ANES)
14.0000 mL/h | INTRAMUSCULAR | Status: DC | PRN
  Administered 2017-05-20 – 2017-05-21 (×3): 14 mL/h via EPIDURAL
  Filled 2017-05-20 (×3): qty 100

## 2017-05-20 MED ORDER — PHENYLEPHRINE 40 MCG/ML (10ML) SYRINGE FOR IV PUSH (FOR BLOOD PRESSURE SUPPORT)
80.0000 ug | PREFILLED_SYRINGE | INTRAVENOUS | Status: DC | PRN
  Filled 2017-05-20: qty 5
  Filled 2017-05-20: qty 10

## 2017-05-20 MED ORDER — SOD CITRATE-CITRIC ACID 500-334 MG/5ML PO SOLN: 30.0000 mL | ORAL | Status: DC | PRN

## 2017-05-20 NOTE — Progress Notes (Signed)
   PRENATAL VISIT NOTE  Subjective:  Maria Riley is a 26 y.o. [phone] at 4717w4d being seen today for ongoing prenatal care.  She is currently monitored for the following issues for this high-risk pregnancy and has Supervision of high risk pregnancy, antepartum and Previous preterm delivery, antepartum on her problem list.  Patient reports no complaints.  Contractions: Not present. Vag. Bleeding: None.  Movement: Present. Denies leaking of fluid.   The following portions of the patient's history were reviewed and updated as appropriate: allergies, current medications, past family history, past medical history, past social history, past surgical history and problem list. Problem list updated.  Objective:   Vitals:   05/20/17 1304  BP: 100/64  Pulse: 80  Weight: 149 lb (67.6 kg)    Fetal Status: Fetal Heart Rate (bpm): 150 Fundal Height: 36 cm Movement: Present  Presentation: Vertex  General:  Alert, oriented and cooperative. Patient is in no acute distress.  Skin: Skin is warm and dry. No rash noted.   Cardiovascular: Normal heart rate noted  Respiratory: Normal respiratory effort, no problems with respiration noted  Abdomen: Soft, gravid, appropriate for gestational age. Pain/Pressure: Present     Pelvic:  Cervical exam performed Dilation: 2.5 Effacement (%): 50 Station: -3  Extremities: Normal range of motion.     Mental Status: Normal mood and affect. Normal behavior. Normal judgment and thought content.   Assessment and Plan:  Pregnancy: [id number] at 4517w4d  1. Previous preterm delivery, antepartum Last 17-P today  2. Supervision of high risk pregnancy, antepartum Patient is doing well Cultures collected today - GC/Chlamydia probe amp ([place] - Strep Gp B NAA  Preterm labor symptoms and general obstetric precautions including but not limited to vaginal bleeding, contractions, leaking of fluid and fetal movement were reviewed in detail with the  patient. Please refer to After Visit Summary for other counseling recommendations.  Return in about 1 week (around 05/27/2017) for ROB.   Catalina AntiguaPeggy Karlee Staff, MD

## 2017-05-20 NOTE — MAU Note (Signed)
Pt reports she is having increased pelvic pressure and contractions. Had doctors appointment today and was told she was 3cm. Contractions have been worse since her exam. Denies any vag bleeding or leaking

## 2017-05-20 NOTE — Anesthesia Procedure Notes (Signed)
Epidural Patient location during procedure: OB Start time: 05/20/2017 9:19 PM End time: 05/20/2017 9:22 PM  Staffing Anesthesiologist: Leilani AbleHATCHETT, Maximino Cozzolino Performed: anesthesiologist   Preanesthetic Checklist Completed: patient identified, surgical consent, pre-op evaluation, timeout performed, IV checked, risks and benefits discussed and monitors and equipment checked  Epidural Patient position: sitting Prep: site prepped and draped and DuraPrep Patient monitoring: continuous pulse ox and blood pressure Approach: midline Location: L3-L4 Injection technique: LOR air  Needle:  Needle type: Tuohy  Needle gauge: 17 G Needle length: 9 cm and 9 Needle insertion depth: 5 cm cm Catheter type: closed end flexible Catheter size: 19 Gauge Catheter at skin depth: 10 cm Test dose: negative and Other  Assessment Sensory level: T9 Events: blood not aspirated, injection not painful, no injection resistance, negative IV test and no paresthesia  Additional Notes Reason for block:procedure for pain

## 2017-05-20 NOTE — Anesthesia Pain Management Evaluation Note (Signed)
  CRNA Pain Management Visit Note  Patient: Maria Riley, 26 y.o., female  "Hello I am a member of the anesthesia team at [facility]. We have an anesthesia team available at all times to provide care throughout the hospital, including epidural management and anesthesia for C-section. I don't know your plan for the delivery whether it a natural birth, water birth, IV sedation, nitrous supplementation, doula or epidural, but we want to meet your pain goals."   1.Was your pain managed to your expectations on prior hospitalizations?   No prior hospitalizations  2.What is your expectation for pain management during this hospitalization?     Epidural  3.How can we help you reach that goal? Epidural when desired.  Record the patient's initial score and the patient's pain goal.   Pain: 0  Pain Goal: 4 [facility] wants you to be able to say your pain was always managed very well.  Maria Riley 05/20/2017

## 2017-05-20 NOTE — Anesthesia Preprocedure Evaluation (Signed)
Anesthesia Evaluation  Patient identified by MRN, date of birth, ID band Patient awake    Reviewed: Allergy & Precautions, H&P , NPO status , Patient's Chart, lab work & pertinent test results  Airway Mallampati: I  TM Distance: >3 FB Neck ROM: full    Dental no notable dental hx. (+) Teeth Intact   Pulmonary neg pulmonary ROS,    Pulmonary exam normal breath sounds clear to auscultation       Cardiovascular negative cardio ROS Normal cardiovascular exam Rhythm:Regular Rate:Normal     Neuro/Psych negative neurological ROS  negative psych ROS   GI/Hepatic negative GI ROS, Neg liver ROS,   Endo/Other  negative endocrine ROS  Renal/GU negative Renal ROS  negative genitourinary   Musculoskeletal negative musculoskeletal ROS (+)   Abdominal Normal abdominal exam  (+)   Peds  Hematology negative hematology ROS (+)   Anesthesia Other Findings   Reproductive/Obstetrics (+) Pregnancy                             Anesthesia Physical Anesthesia Plan  ASA: II  Anesthesia Plan: Epidural   Post-op Pain Management:    Induction:   PONV Risk Score and Plan:   Airway Management Planned:   Additional Equipment:   Intra-op Plan:   Post-operative Plan:   Informed Consent: I have reviewed the patients History and Physical, chart, labs and discussed the procedure including the risks, benefits and alternatives for the proposed anesthesia with the patient or authorized representative who has indicated his/her understanding and acceptance.     Plan Discussed with:   Anesthesia Plan Comments:         Anesthesia Quick Evaluation

## 2017-05-20 NOTE — H&P (Signed)
Maria Riley is a 26 y.o. female [phone] with IUP at [redacted]w[redacted]d presenting for contractions/ pressure. Pt states she has been having irregular, every 2-5 minutes contractions, associated with none vaginal bleeding for 3-4 hours..  Membranes are intact, with active fetal movement.   [facility]  Prenatal History/Complications: 2 PTD at 36 weeks (on 17p since 16 weeks) Didn't finish gtt.  A1C was 4.7 but of course, not relevant to gestational diabetes.   Past Medical History: Past Medical History:  Diagnosis Date  . Medical history non-contributory     Past Surgical History: Past Surgical History:  Procedure Laterality Date  . MOUTH SURGERY      Obstetrical History: OB History    Gravida Para Term Preterm AB Living   3 2 0 2   2   SAB TAB Ectopic Multiple Live Births         0 2       Social History: Social History   Social History  . Marital status: Married    Spouse name: N/A  . Number of children: N/A  . Years of education: N/A   Social History Main Topics  . Smoking status: Never Smoker  . Smokeless tobacco: Never Used  . Alcohol use No  . Drug use: No  . Sexual activity: Yes    Birth control/ protection: None   Other Topics Concern  . None   Social History Narrative  . None    Family History: Family History  Problem Relation Age of Onset  . Alcohol abuse Neg Hx   . Arthritis Neg Hx   . Asthma Neg Hx   . Birth defects Neg Hx   . Cancer Neg Hx   . COPD Neg Hx   . Depression Neg Hx   . Diabetes Neg Hx   . Drug abuse Neg Hx   . Early death Neg Hx   . Hearing loss Neg Hx   . Heart disease Neg Hx   . Hyperlipidemia Neg Hx   . Hypertension Neg Hx   . Kidney disease Neg Hx   . Learning disabilities Neg Hx   . Mental illness Neg Hx   . Mental retardation Neg Hx   . Miscarriages / Stillbirths Neg Hx   . Stroke Neg Hx   . Vision loss Neg Hx   . Varicose Veins Neg Hx     Allergies: No Known Allergies  Prescriptions Prior to Admission   Medication Sig Dispense Refill Last Dose  . acetaminophen (TYLENOL) 325 MG tablet Take 325 mg by mouth every 6 (six) hours as needed for moderate pain.   Not Taking  . NIFEdipine (PROCARDIA-XL/ADALAT-CC/NIFEDICAL-XL) 30 MG 24 hr tablet Take 1 tablet (30 mg total) by mouth 2 (two) times daily. 60 tablet 2 Taking  . Prenatal Vit-Fe Fumarate-FA (MULTIVITAMIN-PRENATAL) 27-0.8 MG TABS tablet Take 1 tablet by mouth daily at 12 noon.   Taking  . terconazole (TERAZOL 3) 0.8 % vaginal cream Place 1 applicator vaginally at bedtime. 20 g 0     [facility] [place] Prenatal Labs  Dating LMP Blood type: A/Positive/-- (12/18 1555) A [pos  Genetic Screen declined Antibody:Negative (12/18 1555)neg  Anatomic [place] Normal; female fetus Rubella: 4.09 (12/18 1555)Immune  GTT  Third trimester: didn't stay for completion A1C: normal, fasting  RPR: Non Reactive (04/16 0920) NR  Flu vaccine  11/23/16 HBsAg: Negative (12/18 1555) Neg  TDaP vaccine  Rhogam:n/a A+ HIV: Non Reactive (04/16 0920) NR      Review of Systems   Constitutional: Negative for fever and chills Eyes: Negative for visual disturbances Respiratory: Negative for shortness of breath, dyspnea Cardiovascular: Negative for chest pain or palpitations  Gastrointestinal: Negative for vomiting, diarrhea and constipation.  POSITIVE for abdominal pain (contractions) Genitourinary: Negative for dysuria and urgency Musculoskeletal: Negative for back pain, joint pain, myalgias  Neurological: Negative for dizziness and headaches      Blood pressure (!) 93/58, pulse (!) 102, temperature 98.2 F (36.8 C), temperature source Oral, resp. rate 20, weight 68 kg (150 lb), last menstrual period 09/06/2016, SpO2 98 %, unknown if currently breastfeeding. General appearance: alert, cooperative and no distress Lungs: clear to auscultation bilaterally Heart: regular rate and rhythm Abdomen: soft, non-tender; bowel sounds  normal Extremities: Homans sign is negative, no sign of DVT DTR's 2+ Presentation: cephalic Fetal monitoring  Baseline: 150 bpm, Variability: Good {> 6 bpm), Accelerations: Reactive and Decelerations: Absent Uterine activity  q 1.5-4 Dilation: 4.5 Effacement (%): 70 Station: -3 Exam by:: Darrold JunkerJ Burgess rnc   Prenatal labs: ABO, Rh: A/Positive/-- (12/18 1555) Antibody: Negative (12/18 1555) Rubella: !Error!immune RPR: Non Reactive (04/16 0920)  HBsAg: Negative (12/18 1555)  HIV: Non Reactive (04/16 0920)    Prenatal Transfer Tool  Maternal Diabetes: didn't finish testing.  Fasting 70 Genetic Screening: Declined Maternal Ultrasounds/Referrals: Normal Fetal Ultrasounds or other Referrals:  None Maternal Substance Abuse:  No Significant Maternal Medications:  None Significant Maternal Lab Results: Lab values include: Other: GBS unknown (culture collected today)     No results found for this or any previous visit (from the past 24 hour(s)).  Assessment: Maria Riley is a 26 y.o. [id number] with an IUP at 4492w4d presenting for preterm labor  Plan: #Labor: expectant management #Pain:  Per request #FWB Cat 1 #ID: GBS: culture pending; tx w/ PCN    CRESENZO-DISHMAN,Wealthy Danielski 05/20/2017, 8:35 PM

## 2017-05-21 ENCOUNTER — Encounter (HOSPITAL_COMMUNITY): Payer: Self-pay | Admitting: *Deleted

## 2017-05-21 DIAGNOSIS — Z3A36 36 weeks gestation of pregnancy: Secondary | ICD-10-CM

## 2017-05-21 LAB — GC/CHLAMYDIA PROBE AMP (~~LOC~~) NOT AT ARMC
Chlamydia: NEGATIVE
Neisseria Gonorrhea: NEGATIVE

## 2017-05-21 LAB — RPR: RPR Ser Ql: NONREACTIVE

## 2017-05-21 MED ORDER — TETANUS-DIPHTH-ACELL PERTUSSIS 5-2.5-18.5 LF-MCG/0.5 IM SUSP
0.5000 mL | Freq: Once | INTRAMUSCULAR | Status: AC
  Administered 2017-05-23: 0.5 mL via INTRAMUSCULAR
  Filled 2017-05-21: qty 0.5

## 2017-05-21 MED ORDER — COCONUT OIL OIL
1.0000 "application " | TOPICAL_OIL | Status: DC | PRN
  Filled 2017-05-21: qty 120

## 2017-05-21 MED ORDER — ONDANSETRON HCL 4 MG PO TABS
4.0000 mg | ORAL_TABLET | ORAL | Status: DC | PRN
Start: 2017-05-21 — End: 2017-05-23

## 2017-05-21 MED ORDER — BENZOCAINE-MENTHOL 20-0.5 % EX AERO: 1.0000 "application " | INHALATION_SPRAY | CUTANEOUS | Status: DC | PRN

## 2017-05-21 MED ORDER — ACETAMINOPHEN 325 MG PO TABS
650.0000 mg | ORAL_TABLET | ORAL | Status: DC | PRN
  Administered 2017-05-22 – 2017-05-23 (×6): 650 mg via ORAL
  Filled 2017-05-21 (×6): qty 2

## 2017-05-21 MED ORDER — SENNOSIDES-DOCUSATE SODIUM 8.6-50 MG PO TABS
2.0000 | ORAL_TABLET | ORAL | Status: DC
  Administered 2017-05-22 (×2): 2 via ORAL
  Filled 2017-05-21 (×2): qty 2

## 2017-05-21 MED ORDER — DIPHENHYDRAMINE HCL 25 MG PO CAPS: 25.0000 mg | ORAL_CAPSULE | Freq: Four times a day (QID) | ORAL | Status: DC | PRN

## 2017-05-21 MED ORDER — PRENATAL MULTIVITAMIN CH
1.0000 | ORAL_TABLET | Freq: Every day | ORAL | Status: DC
  Administered 2017-05-22 – 2017-05-23 (×2): 1 via ORAL
  Filled 2017-05-21 (×2): qty 1

## 2017-05-21 MED ORDER — ONDANSETRON HCL 4 MG/2ML IJ SOLN: 4.0000 mg | INTRAMUSCULAR | Status: DC | PRN

## 2017-05-21 MED ORDER — DIBUCAINE 1 % RE OINT: 1.0000 "application " | TOPICAL_OINTMENT | RECTAL | Status: DC | PRN

## 2017-05-21 MED ORDER — ZOLPIDEM TARTRATE 5 MG PO TABS
5.0000 mg | ORAL_TABLET | Freq: Every evening | ORAL | Status: DC | PRN
Start: 2017-05-21 — End: 2017-05-23

## 2017-05-21 MED ORDER — SIMETHICONE 80 MG PO CHEW: 80.0000 mg | CHEWABLE_TABLET | ORAL | Status: DC | PRN

## 2017-05-21 MED ORDER — IBUPROFEN 600 MG PO TABS
600.0000 mg | ORAL_TABLET | Freq: Four times a day (QID) | ORAL | Status: DC
  Administered 2017-05-21 – 2017-05-23 (×8): 600 mg via ORAL
  Filled 2017-05-21 (×8): qty 1

## 2017-05-21 MED ORDER — WITCH HAZEL-GLYCERIN EX PADS: 1.0000 "application " | MEDICATED_PAD | CUTANEOUS | Status: DC | PRN

## 2017-05-21 NOTE — Progress Notes (Signed)
Comfortable w/epidural Vitals:   05/21/17 0301 05/21/17 0331  BP: 103/70 108/70  Pulse: 74 68  Resp:    Temp:     FHR Cat 1 Ctx q 2-4 minutes  cx 7/90/-1 Doing well.  Anticipate SVD

## 2017-05-21 NOTE — Progress Notes (Signed)
Comfortable w/epidural Vitals:   05/21/17 0101 05/21/17 0131  BP: 100/62 96/70  Pulse: 69 85  Resp:    Temp:  98 F (36.7 C)   FHR Cat 1 Ctx q 2-4 minutes  cx 5.5/80/-2 Doing well.  Anticipate SVD

## 2017-05-21 NOTE — Progress Notes (Signed)
AROM (clear) @1030 .  8/90/-1, Category I tracing.  Continue expectant management. Howard PouchLauren Jhordan Kinter, MD PGY-1 [facility]

## 2017-05-21 NOTE — Progress Notes (Signed)
S: Patient seen & examined for progress of labor.   Dilation: 8 Effacement (%): 90 Cervical Position: Middle Station: -1 Presentation: Vertex Exam by:: amwalker,rn   FHT: 130 bpm, mod var, +accels, no decels TOCO: Irregular   A/P: Labor: progressing normally, plan to AROM  Pain: controlled currently FWB: Cat I   Continue expectant management Anticipate SVD

## 2017-05-22 ENCOUNTER — Encounter (HOSPITAL_COMMUNITY): Payer: Self-pay | Admitting: *Deleted

## 2017-05-22 LAB — CBC
HCT: 29.1 % — ABNORMAL LOW (ref 36.0–46.0)
HEMOGLOBIN: 10.2 g/dL — AB (ref 12.0–15.0)
MCH: 29.9 pg (ref 26.0–34.0)
MCHC: 35.1 g/dL (ref 30.0–36.0)
MCV: 85.3 fL (ref 78.0–100.0)
Platelets: 134 10*3/uL — ABNORMAL LOW (ref 150–400)
RBC: 3.41 MIL/uL — ABNORMAL LOW (ref 3.87–5.11)
RDW: 13.6 % (ref 11.5–15.5)
WBC: 11.1 10*3/uL — ABNORMAL HIGH (ref 4.0–10.5)

## 2017-05-22 LAB — STREP GP B NAA: Strep Gp B NAA: NEGATIVE

## 2017-05-22 MED ORDER — OXYCODONE HCL 5 MG PO TABS
5.0000 mg | ORAL_TABLET | Freq: Once | ORAL | Status: AC
  Administered 2017-05-22: 5 mg via ORAL
  Filled 2017-05-22: qty 1

## 2017-05-22 MED ORDER — IBUPROFEN 600 MG PO TABS
600.0000 mg | ORAL_TABLET | Freq: Four times a day (QID) | ORAL | 0 refills | Status: DC
Start: 2017-05-22 — End: 2017-07-16

## 2017-05-22 NOTE — Progress Notes (Addendum)
Interpreter service  offered to patient for assistance with breakfast order and any questions patient may have but patient declined after waiting on hold for several minutes with Kennyth LosePacifica- was able to tell RN what she wanted for breakfast and stated she had no questions. Order to be placed by staff

## 2017-05-22 NOTE — Progress Notes (Signed)
Maria Riley, interpreter, came into room with this nurse to review evening plan of care and to answer any questions family has.  Plan of care reviewed and questions answered. Feedings updated.  Jtwells, rn

## 2017-05-22 NOTE — Lactation Note (Signed)
This note was copied from a baby's chart. Lactation Consultation Note  Patient Name: Girl Noemi ChapelLinda Gingras [id number]'s Date: 05/22/2017 Reason for consult: Initial assessment;Late preterm infant  LPI 23 hours old. Assisted by Raquel, in-house Spanish interpreter. Mom reports that her first 2 children were also born at 4536 weeks, and she nursed and bottle-feed formula for 12 months with each. Reviewed LPI guidelines and enc putting to breast with cues, then supplementing with EBM/formula according to guidelines--which were reviewed. Mom states that she is post-pumping, and enc mom to continue in order to protect her milk supply. Mom has just finished giving 10 ml of formula and declined assistance with latching--stating that baby is latching well.   [facility] and [facility] for this LC to send a referral--and [facility]. Called mom's nurse to assist with heating pad.   Maternal Data    Feeding    LATCH Score/Interventions                      Lactation Tools Discussed/Used     Consult Status Consult Status: Follow-up Date: 05/23/17 Follow-up type: In-patient    Sherlyn HayJennifer D Tashima Scarpulla 05/22/2017, 4:42 PM

## 2017-05-22 NOTE — Progress Notes (Signed)
Post Partum Day 1 Subjective: no complaints, up ad lib, voiding and tolerating PO  Objective: Blood pressure (!) 91/50, pulse 69, temperature 97.9 F (36.6 C), temperature source Oral, resp. rate 18, height 4\' 11"  (1.499 m), weight 150 lb (68 kg), last menstrual period 09/06/2016, SpO2 99 %, unknown if currently breastfeeding.  Physical Exam:  General: alert, cooperative and no distress Lochia: appropriate Uterine Fundus: firm Incision: n/a  DVT Evaluation: No evidence of DVT seen on physical exam.   Recent Labs  05/20/17 2000 05/22/17 0621  HGB 12.2 10.2*  HCT 35.6* 29.1*    Assessment/Plan: Plan for discharge tomorrow and Breastfeeding   LOS: 2 days   Maria Riley 05/22/2017, 7:27 AM

## 2017-05-22 NOTE — Anesthesia Postprocedure Evaluation (Signed)
Anesthesia Post Note  Patient: Maria Riley  Procedure(s) Performed: * No procedures listed *     Patient location during evaluation: Mother Baby Anesthesia Type: Epidural Level of consciousness: awake and alert Pain management: pain level controlled Vital Signs Assessment: post-procedure vital signs reviewed and stable Respiratory status: spontaneous breathing, nonlabored ventilation and respiratory function stable Cardiovascular status: stable Postop Assessment: no headache, no backache, epidural receding and patient able to bend at knees Anesthetic complications: no    Last Vitals:  Vitals:   05/22/17 0133 05/22/17 0621  BP: (!) 91/55 (!) 91/50  Pulse: 88 69  Resp: 20 18  Temp: 37 C 36.6 C    Last Pain:  Vitals:   05/22/17 0625  TempSrc:   PainSc: 3    Pain Goal: Patients Stated Pain Goal: 0 (05/20/17 1751)               Rica RecordsICKELTON,[name]

## 2017-05-23 ENCOUNTER — Ambulatory Visit: Payer: Self-pay

## 2017-05-23 NOTE — Lactation Note (Signed)
This note was copied from a baby's chart. Lactation Consultation Note  Patient Name: Maria Noemi ChapelLinda Stovall WUJWJ'XToday's Date: 05/23/2017 Reason for consult: Follow-up assessment;Late preterm infant;Infant < 6lbs  LPI 51 hours old. Assisted by Raquel, Spanish interpreter. Mom's breasts are engorged, and are firm to the touch. However, mom's milk is flowing well when she uses hand pump--after using DEBP. Mom has several ounces of EBM at bedside that she has just pumped. Mom given ice and enc to use prior to latching and pumping. Mom reports that this has happened with each of her babies. Assisted mom to latch baby to right breast, but baby has a loud clicking noise when suckling, and mom reports discomfort. Fitted mom with a #24 NS and baby latched deeply and suckled rhythmically with intermittent swallows and breast milk noted in shield. Mom reported increased comfort and clicking noise decreased dramatically. Mom reports that she has used a NS with other babies. Discussed with mom that baby may achieve a deeper without clicking when her breasts engorgement subsides.   Enc mom to continue to put baby to breast with cues and at least every 3 hours. Enc mom to supplement baby with EBM after nursing, and then post-pump as well. Enc mom to use ice prior to nursing and pumping for comfort and to allow milk to flow better. Jan, RN aware of interventions and feeding plan.   Maternal Data    Feeding Feeding Type: Breast Fed Length of feed: 15 min  LATCH Score/Interventions Latch: Grasps breast easily, tongue down, lips flanged, rhythmical sucking. (using NS, not directly on breast.) Intervention(s): Adjust position;Assist with latch  Audible Swallowing: Spontaneous and intermittent Intervention(s): Skin to skin  Type of Nipple: Everted at rest and after stimulation Intervention(s): Double electric pump;Hand pump  Comfort (Breast/Nipple): Filling, red/small blisters or bruises, mild/mod  discomfort  Problem noted: Mild/Moderate discomfort  Hold (Positioning): Assistance needed to correctly position infant at breast and maintain latch. Intervention(s): Support Pillows  LATCH Score: 8  Lactation Tools Discussed/Used Tools: Nipple Shields Nipple shield size: 24   Consult Status Consult Status: Follow-up Date: 05/24/17 Follow-up type: In-patient    Sherlyn HayJennifer D Zareen Jamison 05/23/2017, 9:08 PM

## 2017-05-23 NOTE — Discharge Summary (Signed)
OB Discharge Summary  Patient Name: Maria Riley DOB: [birth year] MRN: [id number]  Date of admission: 05/20/2017 Delivering MD: Raelyn Mora   Date of discharge: 05/23/2017  Admitting diagnosis: 36WKS PAIN Intrauterine pregnancy: [redacted]w[redacted]d     Secondary diagnosis:Principal Problem:   Postpartum care following vaginal delivery Active Problems:   Normal labor  Additional problems:none     Discharge diagnosis: Term Pregnancy Delivered                                                                     Post partum procedures:none  Augmentation: n/a  Complications: None  Hospital course:  Onset of Labor With Vaginal Delivery     26 y.o. yo [phone] at [redacted]w[redacted]d was admitted in Active Labor on 05/20/2017. Patient had an uncomplicated labor course as follows:  Membrane Rupture Time/Date: 10:26 AM ,05/21/2017   Intrapartum Procedures: Episiotomy: None [1]                                         Lacerations:  None [1]  Patient had a delivery of a Viable infant. 05/21/2017  Information for the patient's newborn:  Cayenne, Breault [[id number]]  Delivery Method: Vaginal, Spontaneous Delivery (Filed from Delivery Summary)    Pateint had an uncomplicated postpartum course.  She is ambulating, tolerating a regular diet, passing flatus, and urinating well. Patient is discharged home in stable condition on 05/23/17.   Physical exam  Vitals:   05/22/17 0621 05/22/17 0914 05/22/17 1742 05/23/17 0633  BP: (!) 91/50 100/61 (!) 92/56 100/63  Pulse: 69 69 79 70  Resp: 18 18 18 18   Temp: 97.9 F (36.6 C) 98.1 F (36.7 C) 98.1 F (36.7 C) 98.4 F (36.9 C)  TempSrc: Oral Axillary  Oral  SpO2:  98% 100%   Weight:      Height:       General: alert, cooperative and no distress Lochia: appropriate Uterine Fundus: firm Incision: N/A DVT Evaluation: No evidence of DVT seen on physical exam. Labs: Lab Results  Component Value Date   WBC 11.1 (H) 05/22/2017   HGB 10.2 (L) 05/22/2017    HCT 29.1 (L) 05/22/2017   MCV 85.3 05/22/2017   PLT 134 (L) 05/22/2017   CMP Latest Ref Rng & Units 10/23/2010  Glucose 70 - 99 mg/dL 81  BUN 6 - 23 mg/dL 10  Creatinine 0.4 - 1.2 mg/dL 0.6  Sodium 130 - 865 mEq/L 137  Potassium 3.5 - 5.1 mEq/L 4.2  Chloride 96 - 112 mEq/L 107    Discharge instruction: per After Visit Summary and "Baby and Me Booklet".  After Visit Meds:  Allergies as of 05/23/2017   No Known Allergies     Medication List    STOP taking these medications   NIFEdipine 30 MG 24 hr tablet Commonly known as:  PROCARDIA-XL/ADALAT-CC/NIFEDICAL-XL     TAKE these medications   acetaminophen 325 MG tablet Commonly known as:  TYLENOL Take 325 mg by mouth every 6 (six) hours as needed for moderate pain.   ibuprofen 600 MG tablet Commonly known as:  ADVIL,MOTRIN Take 1 tablet (600 mg total)  by mouth every 6 (six) hours.   multivitamin-prenatal 27-0.8 MG Tabs tablet Take 1 tablet by mouth daily at 12 noon.   terconazole 0.8 % vaginal cream Commonly known as:  TERAZOL 3 Place 1 applicator vaginally at bedtime.       Diet: routine diet  Activity: Advance as tolerated. Pelvic rest for 6 weeks.   Outpatient follow up:6 weeks Follow up Appt:No future appointments. Follow up visit: No Follow-up on file.  Postpartum contraception: Undecided  Newborn Data: Live born female  Birth Weight: 6 lb 2.8 oz (2800 g) APGAR: 8, 9  Baby Feeding: Breast Disposition:home with mother   05/23/2017 Wyvonnia DuskyMarie Lawson, CNM

## 2017-05-23 NOTE — Lactation Note (Signed)
This note was copied from a baby's chart. Lactation Consultation Note  Patient Name: Maria Riley UJWJX'BToday's Date: 05/23/2017   Interpreter present for consult.  Mom states baby is breastfeeding well.  She is post pumping and obtaining 30+ mls of transitional milk.  Instructed to give any breast milk expressed back to baby.  Mom was giving formula per bottle but now will supplement with her milk.  No questions/concerns at present.   Maternal Data    Feeding Feeding Type: Bottle Fed - Breast Milk Length of feed: 15 min  LATCH Score/Interventions Latch: Repeated attempts needed to sustain latch, nipple held in mouth throughout feeding, stimulation needed to elicit sucking reflex.  Audible Swallowing: Spontaneous and intermittent  Type of Nipple: Everted at rest and after stimulation  Comfort (Breast/Nipple): Soft / non-tender     Hold (Positioning): Assistance needed to correctly position infant at breast and maintain latch.  LATCH Score: 8  Lactation Tools Discussed/Used     Consult Status      Huston FoleyMOULDEN, Sherlyn Ebbert S 05/23/2017, 11:43 AM

## 2017-05-24 ENCOUNTER — Ambulatory Visit: Payer: Self-pay

## 2017-05-24 NOTE — Lactation Note (Signed)
This note was copied from a baby's chart. Lactation Consultation Note: Mother is breastfeeding and pumping along with supplementing. Mother just pumped 60 ml. She reports that infant fed last at 9am. Mothers breast are still firm and full after pumping. Advised mother to rouse infant with skin to skin for the next feeding and do good breast massage. Mother is active with WIC. She plans to use the harmony hand pump [facility] depending on [facility]. Mother advised to continue to cue base feed and at least 8-12 times in 24 hours. Mother denies having any concerns or questions. Mother is aware of available LC services and community support.   Patient Name: Girl Noemi ChapelLinda Noack ZOXWR'UToday's Date: 05/24/2017 Reason for consult: Follow-up assessment   Maternal Data    Feeding Feeding Type: Breast Fed  LATCH Score/Interventions Latch: Grasps breast easily, tongue down, lips flanged, rhythmical sucking. Intervention(s): Adjust position;Assist with latch;Breast massage;Breast compression  Audible Swallowing: Spontaneous and intermittent Intervention(s): Skin to skin;Hand expression  Type of Nipple: Everted at rest and after stimulation  Comfort (Breast/Nipple): Filling, red/small blisters or bruises, mild/mod discomfort  Problem noted: Filling Interventions (Mild/moderate discomfort): Hand expression  Hold (Positioning): No assistance needed to correctly position infant at breast.  LATCH Score: 9  Lactation Tools Discussed/Used     Consult Status Consult Status: Follow-up Date: 05/24/17 Follow-up type: In-patient    Stevan BornKendrick, Ramzy Cappelletti [facility] 05/24/2017, 11:18 AM

## 2017-07-07 ENCOUNTER — Ambulatory Visit: Payer: Self-pay | Admitting: Certified Nurse Midwife

## 2017-07-16 ENCOUNTER — Ambulatory Visit (HOSPITAL_COMMUNITY)
Admission: EM | Admit: 2017-07-16 | Discharge: 2017-07-16 | Disposition: A | Discharge status: Home / Self Care | Payer: Medicaid Other | Attending: Emergency Medicine | Admitting: Emergency Medicine

## 2017-07-16 ENCOUNTER — Encounter (HOSPITAL_COMMUNITY): Payer: Self-pay | Admitting: Emergency Medicine

## 2017-07-16 DIAGNOSIS — J029 Acute pharyngitis, unspecified: Secondary | ICD-10-CM | POA: Insufficient documentation

## 2017-07-16 LAB — POCT RAPID STREP A: STREPTOCOCCUS, GROUP A SCREEN (DIRECT): NEGATIVE

## 2017-07-16 MED ORDER — BENZOCAINE-GLYCERIN-DM 5-30-5 %-MG/SPRAY PO LIQD: 1.0000 | Freq: Four times a day (QID) | ORAL | 0 refills | Status: DC | PRN

## 2017-07-16 NOTE — Discharge Instructions (Signed)
Your strep test was negative, most likely viral pharyngitis, rest, drink plenty of fluids, Tylenol for fever or pain, and I prescribed benzocaine spray for sore throat, one spray in the back of throat up to 4 times a day as needed for pain. If symptoms persist past one week return to clinic.

## 2017-07-16 NOTE — ED Provider Notes (Signed)
  [facility]   [id number] 07/16/17 Arrival Time: 1116  ASSESSMENT & PLAN:  1. Viral pharyngitis     Meds ordered this encounter  Medications  . Benzocaine-Glycerin-DM 5-30-5 %-MG/SPRAY LIQD    Sig: Take 1 spray by mouth 4 (four) times daily as needed.    Dispense:  20.2 mL    Refill:  0    Order Specific Question:   Supervising Provider    Answer:   Mardella LaymanHAGLER, BRIAN [[id number]]    Reviewed expectations re: course of current medical issues. Questions answered. Outlined signs and symptoms indicating need for more acute intervention. Patient verbalized understanding. After Visit Summary given.   SUBJECTIVE:  Maria Riley is a 26 y.o. female who presents with complaint of sore throat for 3 days, denies cough, has had no cough, reports fever on the first day, no change in voice, does have painful swallowing, No nausea, vomiting, diarrhea, or abdominal pain. No headache, congestion, ear pain or pressure  ROS: As per HPI, remainder of ROS negative.   OBJECTIVE:  Vitals:   07/16/17 1142  BP: (!) 104/57  Pulse: 71  Resp: 16  Temp: 98.3 F (36.8 C)  TempSrc: Oral  SpO2: 99%     General appearance: alert; no distress HEENT: normocephalic; atraumatic; conjunctivae normal; Oropharynx; there is oropharyngeal erythema, but no edema, tonsils +1, no exudate, no evidence of tonsillar abscess Neck: There is tenderness cervical lymphadenopathy Lungs: clear to auscultation bilaterally Heart: regular rate and rhythm Abdomen: soft, non-tender; bowel sounds normal; no masses or organomegaly; no guarding or rebound tenderness Back: no CVA tenderness Extremities: no cyanosis or edema; symmetrical with no gross deformities Skin: warm and dry Neurologic: Grossly normal Psychological:  alert and cooperative; normal mood and affect  Procedures:     Results for orders placed or performed during the hospital encounter of 07/16/17  POCT rapid strep A [facility]  Result  Value Ref Range   Streptococcus, Group A Screen (Direct) NEGATIVE NEGATIVE    Labs Reviewed  POCT RAPID STREP A    No results found.  No Known Allergies  PMHx, SurgHx, SocialHx, Medications, and Allergies were reviewed in the Visit Navigator and updated as appropriate.       Dorena BodoKennard, Diego Ulbricht, NP 07/16/17 1224

## 2017-07-16 NOTE — ED Triage Notes (Signed)
The patient presented to the [facility] with a complaint of a sore throat x 3 days. 

## 2017-07-18 LAB — CULTURE, GROUP A STREP (THRC)

## 2017-10-19 ENCOUNTER — Other Ambulatory Visit: Payer: Self-pay

## 2017-10-19 ENCOUNTER — Encounter: Payer: Self-pay | Admitting: Certified Nurse Midwife

## 2017-10-19 ENCOUNTER — Ambulatory Visit: Payer: Self-pay | Admitting: Certified Nurse Midwife

## 2017-10-19 ENCOUNTER — Other Ambulatory Visit (HOSPITAL_COMMUNITY)
Admission: RE | Admit: 2017-10-19 | Discharge: 2017-10-19 | Disposition: A | Discharge status: Home / Self Care | Payer: Medicaid Other | Source: Ambulatory Visit | Attending: Certified Nurse Midwife | Admitting: Certified Nurse Midwife

## 2017-10-19 ENCOUNTER — Ambulatory Visit (INDEPENDENT_AMBULATORY_CARE_PROVIDER_SITE_OTHER): Payer: Medicaid Other | Admitting: Certified Nurse Midwife

## 2017-10-19 VITALS — BP 90/66 | HR 76 | Ht 59.0 in | Wt 129.4 lb

## 2017-10-19 DIAGNOSIS — L659 Nonscarring hair loss, unspecified: Secondary | ICD-10-CM

## 2017-10-19 DIAGNOSIS — B9689 Other specified bacterial agents as the cause of diseases classified elsewhere: Secondary | ICD-10-CM

## 2017-10-19 DIAGNOSIS — N760 Acute vaginitis: Secondary | ICD-10-CM | POA: Diagnosis not present

## 2017-10-19 DIAGNOSIS — Z30011 Encounter for initial prescription of contraceptive pills: Secondary | ICD-10-CM

## 2017-10-19 DIAGNOSIS — N898 Other specified noninflammatory disorders of vagina: Secondary | ICD-10-CM | POA: Insufficient documentation

## 2017-10-19 MED ORDER — PRENATE PIXIE 10-0.6-0.4-200 MG PO CAPS: 1.0000 | ORAL_CAPSULE | Freq: Every day | ORAL | 12 refills | Status: DC

## 2017-10-19 MED ORDER — NORETHIN-ETH ESTRAD-FE BIPHAS 1 MG-10 MCG / 10 MCG PO TABS: 1.0000 | ORAL_TABLET | Freq: Every day | ORAL | 4 refills | Status: DC

## 2017-10-19 NOTE — Progress Notes (Signed)
GYN Visit, did not have PP visit. Wants OCP. Complains that her hair is falling out.

## 2017-10-19 NOTE — Progress Notes (Signed)
Subjective:    Maria Riley is a 26 y.o. female who presents for contraception counseling. The patient has no complaints today. The patient is sexually active. Pertinent past medical history: none.  The information documented in the HPI was reviewed and verified.  Menstrual History: OB History    Gravida Para Term Preterm AB Living   3 3 0 3 0 3   SAB TAB Ectopic Multiple Live Births   0 0 0 0 3      Patient's last menstrual period was 10/14/2017 (exact date).   There are no active problems to display for this patient.  Past Medical History:  Diagnosis Date  . Medical history non-contributory   . Postpartum care following vaginal delivery 05/21/2017    Past Surgical History:  Procedure Laterality Date  . MOUTH SURGERY       Current Outpatient Medications:  .  Benzocaine-Glycerin-DM 5-30-5 %-MG/SPRAY LIQD, Take 1 spray by mouth 4 (four) times daily as needed. (Patient not taking: Reported on 10/19/2017), Disp: 20.2 mL, Rfl: 0 .  metroNIDAZOLE (FLAGYL) 500 MG tablet, Take 1 tablet (500 mg total) 2 (two) times daily by mouth., Disp: 14 tablet, Rfl: 0 .  Norethindrone-Ethinyl Estradiol-Fe Biphas (LO LOESTRIN FE) 1 MG-10 MCG / 10 MCG tablet, Take 1 tablet daily by mouth. Take 1 tablet by mouth daily., Disp: 3 Package, Rfl: 4 .  Prenat-FeAsp-Meth-FA-DHA w/o A (PRENATE PIXIE) 10-0.6-0.4-200 MG CAPS, Take 1 tablet daily by mouth., Disp: 30 capsule, Rfl: 12 No Known Allergies  Social History   Tobacco Use  . Smoking status: Never Smoker  . Smokeless tobacco: Never Used  Substance Use Topics  . Alcohol use: No    Family History  Problem Relation Age of Onset  . Alcohol abuse Neg Hx   . Arthritis Neg Hx   . Asthma Neg Hx   . Birth defects Neg Hx   . Cancer Neg Hx   . COPD Neg Hx   . Depression Neg Hx   . Diabetes Neg Hx   . Drug abuse Neg Hx   . Early death Neg Hx   . Hearing loss Neg Hx   . Heart disease Neg Hx   . Hyperlipidemia Neg Hx   . Hypertension Neg Hx   .  Kidney disease Neg Hx   . Learning disabilities Neg Hx   . Mental illness Neg Hx   . Mental retardation Neg Hx   . Miscarriages / Stillbirths Neg Hx   . Stroke Neg Hx   . Vision loss Neg Hx   . Varicose Veins Neg Hx        Review of Systems Constitutional: negative for weight loss Genitourinary:negative for abnormal menstrual periods and vaginal discharge   Objective:   BP 90/66   Pulse 76   Ht 4\' 11"  (1.499 m)   Wt 129 lb 6.4 oz (58.7 kg)   LMP 10/14/2017 (Exact Date)   Breastfeeding? No   BMI 26.14 kg/m    General:   alert  Skin:   no rash or abnormalities  Lungs:   clear to auscultation bilaterally  Heart:   regular rate and rhythm, S1, S2 normal, no murmur, click, rub or gallop  Breasts:   deferred  Abdomen:  normal findings: no organomegaly, soft, non-tender and no hernia  Pelvis:  External genitalia: normal general appearance Urinary system: urethral meatus normal and bladder without fullness, nontender Vaginal: normal without tenderness, induration or masses Cervix: no CMT   Lab Review Urine pregnancy  test Labs reviewed yes Radiologic studies reviewed no  50% of 15 min visit spent on counseling and coordination of care.    Assessment:    26 y.o., starting OCP (estrogen/progesterone), no contraindications.  BV  Plan:   Reviewed all forms of birth control options available including abstinence; over the counter/barrier methods; hormonal contraceptive medication including pill, patch, ring, injection,contraceptive implant; hormonal and nonhormonal IUDs; permanent sterilization options including vasectomy and the various tubal sterilization modalities. Risks and benefits reviewed.  Questions were answered.  Information was given to patient to review.    All questions answered.  Meds ordered this encounter  Medications  . Prenat-FeAsp-Meth-FA-DHA w/o A (PRENATE PIXIE) 10-0.6-0.4-200 MG CAPS    Sig: Take 1 tablet daily by mouth.    Dispense:  30 capsule     Refill:  12    Please process coupon: Rx BIN: [id number], RxPCN: [id number], RxGRP: [id number]: [id number], Rx: [id number]: [id number]  SUF: 01  . Norethindrone-Ethinyl Estradiol-Fe Biphas (LO LOESTRIN FE) 1 MG-10 MCG / 10 MCG tablet    Sig: Take 1 tablet daily by mouth. Take 1 tablet by mouth daily.    Dispense:  3 Package    Refill:  4    BIN # [id number], PCN# [id number], [id number], ID#: [id number]  . metroNIDAZOLE (FLAGYL) 500 MG tablet    Sig: Take 1 tablet (500 mg total) 2 (two) times daily by mouth.    Dispense:  14 tablet    Refill:  0   Orders Placed This Encounter  Procedures  . TSH

## 2017-10-20 LAB — TSH: TSH: 1.29 u[IU]/mL (ref 0.450–4.500)

## 2017-10-20 LAB — CERVICOVAGINAL ANCILLARY ONLY
Bacterial vaginitis: POSITIVE — AB
CHLAMYDIA, DNA PROBE: NEGATIVE
Candida vaginitis: NEGATIVE
NEISSERIA GONORRHEA: NEGATIVE
TRICH (WINDOWPATH): NEGATIVE

## 2017-10-21 MED ORDER — METRONIDAZOLE 500 MG PO TABS: 500.0000 mg | ORAL_TABLET | Freq: Two times a day (BID) | ORAL | 0 refills | Status: DC

## 2018-01-14 ENCOUNTER — Other Ambulatory Visit (HOSPITAL_COMMUNITY)
Admission: RE | Admit: 2018-01-14 | Discharge: 2018-01-14 | Disposition: A | Discharge status: Home / Self Care | Payer: Medicaid Other | Source: Ambulatory Visit | Attending: Obstetrics | Admitting: Obstetrics

## 2018-01-14 ENCOUNTER — Ambulatory Visit (INDEPENDENT_AMBULATORY_CARE_PROVIDER_SITE_OTHER): Payer: Medicaid Other | Admitting: Obstetrics

## 2018-01-14 ENCOUNTER — Encounter: Payer: Self-pay | Admitting: Obstetrics

## 2018-01-14 VITALS — BP 100/62 | HR 77 | Wt 124.2 lb

## 2018-01-14 DIAGNOSIS — O0990 Supervision of high risk pregnancy, unspecified, unspecified trimester: Secondary | ICD-10-CM | POA: Diagnosis present

## 2018-01-14 DIAGNOSIS — O09899 Supervision of other high risk pregnancies, unspecified trimester: Secondary | ICD-10-CM

## 2018-01-14 DIAGNOSIS — O09211 Supervision of pregnancy with history of pre-term labor, first trimester: Secondary | ICD-10-CM

## 2018-01-14 DIAGNOSIS — O09219 Supervision of pregnancy with history of pre-term labor, unspecified trimester: Principal | ICD-10-CM

## 2018-01-14 DIAGNOSIS — O0991 Supervision of high risk pregnancy, unspecified, first trimester: Secondary | ICD-10-CM

## 2018-01-14 LAB — POCT URINE PREGNANCY: PREG TEST UR: POSITIVE — AB

## 2018-01-14 MED ORDER — PRENATE PIXIE 10-0.6-0.4-200 MG PO CAPS: 1.0000 | ORAL_CAPSULE | Freq: Every day | ORAL | 12 refills | Status: DC

## 2018-01-14 MED ORDER — HYDROXYPROGESTERONE CAPROATE 275 MG/1.1ML ~~LOC~~ SOAJ
275.0000 mg | SUBCUTANEOUS | Status: DC
  Administered 2018-02-07: 275 mg via SUBCUTANEOUS

## 2018-01-14 NOTE — Progress Notes (Signed)
Patient is in the office for initial ob visit, married, unplanned pregnancy. Pregnancy was confirmed in [place]. Pt stated that does not want to do 17-p injections with this pregnancy, advised of risks/benefits.

## 2018-01-14 NOTE — Progress Notes (Signed)
Subjective:    Maria Riley is being seen today for her first obstetrical visit.  This is not a planned pregnancy. She is at 7250w4d gestation. Her obstetrical history is significant for preterm deliveries x 3. Relationship with FOB: spouse, living together. Patient does intend to breast feed. Pregnancy history fully reviewed.  The information documented in the HPI was reviewed and verified.  Menstrual History: OB History    Gravida Para Term Preterm AB Living   4 3 0 3 0 3   SAB TAB Ectopic Multiple Live Births   0 0 0 0 3       Patient's last menstrual period was 10/11/2017.    Past Medical History:  Diagnosis Date  . Medical history non-contributory   . Postpartum care following vaginal delivery 05/21/2017    Past Surgical History:  Procedure Laterality Date  . MOUTH SURGERY       (Not in a hospital admission) No Known Allergies  Social History   Tobacco Use  . Smoking status: Never Smoker  . Smokeless tobacco: Never Used  Substance Use Topics  . Alcohol use: No    Family History  Problem Relation Age of Onset  . Alcohol abuse Neg Hx   . Arthritis Neg Hx   . Asthma Neg Hx   . Birth defects Neg Hx   . Cancer Neg Hx   . COPD Neg Hx   . Depression Neg Hx   . Diabetes Neg Hx   . Drug abuse Neg Hx   . Early death Neg Hx   . Hearing loss Neg Hx   . Heart disease Neg Hx   . Hyperlipidemia Neg Hx   . Hypertension Neg Hx   . Kidney disease Neg Hx   . Learning disabilities Neg Hx   . Mental illness Neg Hx   . Mental retardation Neg Hx   . Miscarriages / Stillbirths Neg Hx   . Stroke Neg Hx   . Vision loss Neg Hx   . Varicose Veins Neg Hx      Review of Systems Constitutional: negative for weight loss Gastrointestinal: negative for vomiting Genitourinary:negative for genital lesions and vaginal discharge and dysuria Musculoskeletal:negative for back pain Behavioral/Psych: negative for abusive relationship, depression, illegal drug usage and tobacco use     Objective:    BP 100/62   Pulse 77   Wt 124 lb 3.2 oz (56.3 kg)   LMP 10/11/2017   BMI 25.09 kg/m  General Appearance:    Alert, cooperative, no distress, appears stated age  Head:    Normocephalic, without obvious abnormality, atraumatic  Eyes:    PERRL, conjunctiva/corneas clear, EOM's intact, fundi    benign, both eyes  Ears:    Normal TM's and external ear canals, both ears  Nose:   Nares normal, septum midline, mucosa normal, no drainage    or sinus tenderness  Throat:   Lips, mucosa, and tongue normal; teeth and gums normal  Neck:   Supple, symmetrical, trachea midline, no adenopathy;    thyroid:  no enlargement/tenderness/nodules; no carotid   bruit or JVD  Back:     Symmetric, no curvature, ROM normal, no CVA tenderness  Lungs:     Clear to auscultation bilaterally, respirations unlabored  Chest Wall:    No tenderness or deformity   Heart:    Regular rate and rhythm, S1 and S2 normal, no murmur, rub   or gallop  Breast Exam:    No tenderness, masses, or nipple abnormality  Abdomen:     Soft, non-tender, bowel sounds active all four quadrants,    no masses, no organomegaly  Genitalia:    Normal female without lesion, discharge or tenderness  Extremities:   Extremities normal, atraumatic, no cyanosis or edema  Pulses:   2+ and symmetric all extremities  Skin:   Skin color, texture, turgor normal, no rashes or lesions  Lymph nodes:   Cervical, supraclavicular, and axillary nodes normal  Neurologic:   CNII-XII intact, normal strength, sensation and reflexes    throughout      Lab Review Urine pregnancy test Labs reviewed yes Radiologic studies reviewed no Assessment:    Pregnancy at [redacted]w[redacted]d weeks    Plan:      Prenatal vitamins.  Counseling provided regarding continued use of seat belts, cessation of alcohol consumption, smoking or use of illicit drugs; infection precautions i.e., influenza/TDAP immunizations, toxoplasmosis,CMV, parvovirus, listeria and varicella;  workplace safety, exercise during pregnancy; routine dental care, safe medications, sexual activity, hot tubs, saunas, pools, travel, caffeine use, fish and methlymercury, potential toxins, hair treatments, varicose veins Weight gain recommendations per IOM guidelines reviewed: underweight/BMI< 18.5--> gain 28 - 40 lbs; normal weight/BMI 18.5 - 24.9--> gain 25 - 35 lbs; overweight/BMI 25 - 29.9--> gain 15 - 25 lbs; obese/BMI >30->gain  11 - 20 lbs Problem list reviewed and updated. FIRST/CF mutation testing/NIPT/QUAD SCREEN/fragile X/Ashkenazi Jewish population testing/Spinal muscular atrophy discussed: requested. Role of ultrasound in pregnancy discussed; fetal survey: requested. Amniocentesis discussed: not indicated. VBAC calculator score: VBAC consent form provided No orders of the defined types were placed in this encounter.  No orders of the defined types were placed in this encounter.   Follow up in 3 weeks.  Start weekly17-P Injections at 16 weeks. 50% of 20 min visit spent on counseling and coordination of care.     Brock Bad MD

## 2018-01-14 NOTE — Progress Notes (Signed)
.  lh

## 2018-01-16 LAB — URINE CULTURE, OB REFLEX

## 2018-01-16 LAB — CULTURE, OB URINE

## 2018-01-17 LAB — CERVICOVAGINAL ANCILLARY ONLY
BACTERIAL VAGINITIS: NEGATIVE
CANDIDA VAGINITIS: POSITIVE — AB
Chlamydia: NEGATIVE
NEISSERIA GONORRHEA: NEGATIVE
Trichomonas: NEGATIVE

## 2018-01-18 ENCOUNTER — Other Ambulatory Visit: Payer: Self-pay | Admitting: Obstetrics

## 2018-01-18 DIAGNOSIS — D696 Thrombocytopenia, unspecified: Secondary | ICD-10-CM

## 2018-01-18 DIAGNOSIS — O99119 Other diseases of the blood and blood-forming organs and certain disorders involving the immune mechanism complicating pregnancy, unspecified trimester: Secondary | ICD-10-CM

## 2018-01-18 DIAGNOSIS — B373 Candidiasis of vulva and vagina: Secondary | ICD-10-CM

## 2018-01-18 DIAGNOSIS — B3731 Acute candidiasis of vulva and vagina: Secondary | ICD-10-CM

## 2018-01-18 DIAGNOSIS — E559 Vitamin D deficiency, unspecified: Secondary | ICD-10-CM

## 2018-01-18 LAB — OBSTETRIC PANEL, INCLUDING HIV
Antibody Screen: NEGATIVE
Basophils Absolute: 0 10*3/uL (ref 0.0–0.2)
Basos: 0 %
EOS (ABSOLUTE): 0 10*3/uL (ref 0.0–0.4)
Eos: 1 %
HEMATOCRIT: 38.6 % (ref 34.0–46.6)
HEMOGLOBIN: 12.8 g/dL (ref 11.1–15.9)
HIV Screen 4th Generation wRfx: NONREACTIVE
Hepatitis B Surface Ag: NEGATIVE
IMMATURE GRANS (ABS): 0 10*3/uL (ref 0.0–0.1)
IMMATURE GRANULOCYTES: 0 %
LYMPHS: 30 %
Lymphocytes Absolute: 1.4 10*3/uL (ref 0.7–3.1)
MCH: 28.7 pg (ref 26.6–33.0)
MCHC: 33.2 g/dL (ref 31.5–35.7)
MCV: 87 fL (ref 79–97)
MONOCYTES: 6 %
Monocytes Absolute: 0.3 10*3/uL (ref 0.1–0.9)
NEUTROS ABS: 2.9 10*3/uL (ref 1.4–7.0)
NEUTROS PCT: 63 %
PLATELETS: 261 10*3/uL (ref 150–379)
RBC: 4.46 x10E6/uL (ref 3.77–5.28)
RDW: 14 % (ref 12.3–15.4)
RPR: NONREACTIVE
RUBELLA: 2.68 {index} (ref >0.99)
Rh Factor: POSITIVE
WBC: 4.6 10*3/uL (ref 3.4–10.8)

## 2018-01-18 LAB — HEMOGLOBIN A1C
Est. average glucose Bld gHb Est-mCnc: 91 mg/dL
Hgb A1c MFr Bld: 4.8 % (ref 4.8–5.6)

## 2018-01-18 LAB — CYTOLOGY - PAP
DIAGNOSIS: NEGATIVE
HPV (WINDOPATH): NOT DETECTED

## 2018-01-18 LAB — HEMOGLOBINOPATHY EVALUATION
HEMOGLOBIN F QUANTITATION: 0 % (ref 0.0–2.0)
HGB A: 97 % (ref 96.4–98.8)
HGB C: 0 %
HGB S: 0 %
HGB VARIANT: 0 %
Hemoglobin A2 Quantitation: 3 % (ref 1.8–3.2)

## 2018-01-18 LAB — VITAMIN D 25 HYDROXY (VIT D DEFICIENCY, FRACTURES): VIT D 25 HYDROXY: 19.6 ng/mL — AB (ref 30.0–100.0)

## 2018-01-18 MED ORDER — TERCONAZOLE 0.4 % VA CREA: 1.0000 | TOPICAL_CREAM | Freq: Every day | VAGINAL | 0 refills | Status: DC

## 2018-01-18 MED ORDER — VITAMIN D 50 MCG (2000 UT) PO CAPS: 1.0000 | ORAL_CAPSULE | Freq: Every day | ORAL | 11 refills | Status: DC

## 2018-01-19 ENCOUNTER — Ambulatory Visit (HOSPITAL_COMMUNITY): Payer: Medicaid Other

## 2018-01-20 ENCOUNTER — Other Ambulatory Visit: Payer: Self-pay | Admitting: Obstetrics

## 2018-01-20 ENCOUNTER — Ambulatory Visit (HOSPITAL_COMMUNITY)
Admission: RE | Admit: 2018-01-20 | Discharge: 2018-01-20 | Disposition: A | Discharge status: Home / Self Care | Payer: Medicaid Other | Source: Ambulatory Visit | Attending: Obstetrics | Admitting: Obstetrics

## 2018-01-20 ENCOUNTER — Encounter (HOSPITAL_COMMUNITY): Payer: Self-pay

## 2018-01-20 ENCOUNTER — Other Ambulatory Visit (HOSPITAL_COMMUNITY): Payer: Self-pay | Admitting: *Deleted

## 2018-01-20 DIAGNOSIS — O09892 Supervision of other high risk pregnancies, second trimester: Secondary | ICD-10-CM | POA: Insufficient documentation

## 2018-01-20 DIAGNOSIS — Z3689 Encounter for other specified antenatal screening: Secondary | ICD-10-CM

## 2018-01-20 DIAGNOSIS — O09899 Supervision of other high risk pregnancies, unspecified trimester: Secondary | ICD-10-CM

## 2018-01-20 DIAGNOSIS — Z3A14 14 weeks gestation of pregnancy: Secondary | ICD-10-CM | POA: Insufficient documentation

## 2018-01-20 DIAGNOSIS — O321XX0 Maternal care for breech presentation, not applicable or unspecified: Secondary | ICD-10-CM | POA: Diagnosis not present

## 2018-01-20 DIAGNOSIS — O09212 Supervision of pregnancy with history of pre-term labor, second trimester: Secondary | ICD-10-CM | POA: Diagnosis not present

## 2018-01-20 DIAGNOSIS — O09219 Supervision of pregnancy with history of pre-term labor, unspecified trimester: Principal | ICD-10-CM

## 2018-01-20 DIAGNOSIS — O0990 Supervision of high risk pregnancy, unspecified, unspecified trimester: Secondary | ICD-10-CM

## 2018-01-20 NOTE — Consult Note (Signed)
MFM consult  27 yr old [phone] at 2170w3d with history of preterm deliveries referred by Dr. Clearance CootsHarper for fetal ultrasound and consult.  Past OB hx: 2012- SVD at 2636w0d- PTL 2016- SVD at 4955w3d- PTL 2018- SVD at 56109w5d- PTL  No other significant medical history Medications: none Allergies: NKDA  Ultrasound today shows: single intrauterine pregnancy. Fetal biometry is consistent with dating. Anterior placenta. Normal amniotic fluid volume. The anatomy survey is limited by early gestational age; no abnormalities seen on limited exam.  I counseled the patient as follows: 1. Appropriate fetal growth. 2. Limited anatomy survey: - recommend follow up in 4 weeks to reevaluate fetal anatomy 3. History of preterm deliveries: - I discussed that with a history of preterm delivery the risk is increased in future pregnancies. Studies have shown that the frequency of recurrent preterm birth was 14 to 22 % after one preterm delivery, 28 to 42 % after two preterm deliveries, and 67% after three preterm deliveries. Term births decreased the risk of preterm birth in subsequent pregnancies. I also explained that given these were all late preterm deliveries her overall risk of preterm delivery is significantly increased however it is reassuring that she has not had any very early deliveries. I discussed there are several options for management in this pregnancy to help decrease the risk of preterm birth. I discussed the option of weekly injections of 17 OH progesterone as use from 16-36 weeks has been shown to decrease the risk of recurrent preterm birth by up to 40%. I discussed we do not know of any adverse effects of progesterone however long term data is limited. Patient is in agree ment and would like to start progesterone injections at 16 weeks.  Discussed the recommendation for serial transvaginal cervical lengths at least every 2 weeks from 16-24 weeks. If any shortening or dilation is seen during this time we  have the option of ultrasound indicated cerclage and/or adding vaginal progesterone both of which have been shown to decrease the risk of preterm delivery in women with a short cervix especially with a history or preterm deliveries.  I discussed the option of prophylactic cervical cerclage in women with prior pregnancies complicated by early loss or preterm delivery. Discussed patient does not meet criteria for a history indicated cerclage as she had normal cervical exams until she went into labor at 36+ weeks so this is not likely cervical insufficiency.  - recommend preterm labor precautions 4. Short interval pregnancy: - discussed association with increased risk of preterm delivery and fetal growth restriction - recommend serial fetal growth in the third trimester 5. Recommend fetal anatomic survey at 18-20 weeks.  I spent a total of 60 minutes with the patient of which >50% was in face to face consultation. Please call with questions.  Eulis FosterKristen Sherina Stammer, MD

## 2018-01-22 LAB — CYSTIC FIBROSIS MUTATION 97: GENE DIS ANAL CARRIER INTERP BLD/T-IMP: NOT DETECTED

## 2018-02-03 ENCOUNTER — Other Ambulatory Visit: Payer: Self-pay | Admitting: Obstetrics

## 2018-02-03 ENCOUNTER — Ambulatory Visit (HOSPITAL_COMMUNITY)
Admission: RE | Admit: 2018-02-03 | Discharge: 2018-02-03 | Disposition: A | Discharge status: Home / Self Care | Payer: Medicaid Other | Source: Ambulatory Visit | Attending: Obstetrics | Admitting: Obstetrics

## 2018-02-03 ENCOUNTER — Encounter (HOSPITAL_COMMUNITY): Payer: Self-pay

## 2018-02-03 DIAGNOSIS — O09899 Supervision of other high risk pregnancies, unspecified trimester: Secondary | ICD-10-CM

## 2018-02-03 DIAGNOSIS — O09219 Supervision of pregnancy with history of pre-term labor, unspecified trimester: Principal | ICD-10-CM

## 2018-02-03 DIAGNOSIS — Z3A16 16 weeks gestation of pregnancy: Secondary | ICD-10-CM

## 2018-02-03 DIAGNOSIS — O09892 Supervision of other high risk pregnancies, second trimester: Secondary | ICD-10-CM | POA: Insufficient documentation

## 2018-02-03 DIAGNOSIS — Z3686 Encounter for antenatal screening for cervical length: Secondary | ICD-10-CM | POA: Insufficient documentation

## 2018-02-03 DIAGNOSIS — O09212 Supervision of pregnancy with history of pre-term labor, second trimester: Secondary | ICD-10-CM | POA: Diagnosis not present

## 2018-02-07 ENCOUNTER — Encounter: Payer: Self-pay | Admitting: Obstetrics

## 2018-02-07 ENCOUNTER — Ambulatory Visit (INDEPENDENT_AMBULATORY_CARE_PROVIDER_SITE_OTHER): Payer: Medicaid Other | Admitting: Obstetrics

## 2018-02-07 VITALS — BP 96/62 | HR 76 | Wt 127.9 lb

## 2018-02-07 DIAGNOSIS — O09212 Supervision of pregnancy with history of pre-term labor, second trimester: Secondary | ICD-10-CM

## 2018-02-07 DIAGNOSIS — O09219 Supervision of pregnancy with history of pre-term labor, unspecified trimester: Secondary | ICD-10-CM

## 2018-02-07 DIAGNOSIS — O0990 Supervision of high risk pregnancy, unspecified, unspecified trimester: Secondary | ICD-10-CM

## 2018-02-07 DIAGNOSIS — O0992 Supervision of high risk pregnancy, unspecified, second trimester: Secondary | ICD-10-CM

## 2018-02-07 DIAGNOSIS — O09899 Supervision of other high risk pregnancies, unspecified trimester: Secondary | ICD-10-CM

## 2018-02-07 MED ORDER — PRENATE PIXIE 10-0.6-0.4-200 MG PO CAPS: 1.0000 | ORAL_CAPSULE | Freq: Every day | ORAL | 12 refills | Status: DC

## 2018-02-07 NOTE — Progress Notes (Signed)
Subjective:  Maria Riley is a 27 y.o. [phone] at 413w0d being seen today for ongoing prenatal care.  She is currently monitored for the following issues for this high-risk pregnancy:  History of previous preterm delivery.  Patient reports no complaints.  Contractions: Not present. Vag. Bleeding: None.   . Denies leaking of fluid.   The following portions of the patient's history were reviewed and updated as appropriate: allergies, current medications, past family history, past medical history, past social history, past surgical history and problem list. Problem list updated.  Objective:   Vitals:   02/07/18 1013  BP: 96/62  Pulse: 76  Weight: 127 lb 14.4 oz (58 kg)    Fetal Status: Fetal Heart Rate (bpm): 150         General:  Alert, oriented and cooperative. Patient is in no acute distress.  Skin: Skin is warm and dry. No rash noted.   Cardiovascular: Normal heart rate noted  Respiratory: Normal respiratory effort, no problems with respiration noted  Abdomen: Soft, gravid, appropriate for gestational age. Pain/Pressure: Absent     Pelvic:  Cervical exam deferred        Extremities: Normal range of motion.  Edema: None  Mental Status: Normal mood and affect. Normal behavior. Normal judgment and thought content.   Urinalysis:      Assessment and Plan:  Pregnancy: [id number] at 513w0d  1. Supervision of high risk pregnancy, antepartum Rx: - Prenat-FeAsp-Meth-FA-DHA w/o A (PRENATE PIXIE) 10-0.6-0.4-200 MG CAPS; Take 1 tablet by mouth daily.  Dispense: 30 capsule; Refill: 12  2. History of preterm delivery, currently pregnant - Ultrasound ordered - referred to MFM  - 17-P started today  Preterm labor symptoms and general obstetric precautions including but not limited to vaginal bleeding, contractions, leaking of fluid and fetal movement were reviewed in detail with the patient. Please refer to After Visit Summary for other counseling recommendations.  Return in about 4 weeks  (around 03/07/2018) for ROB.   Brock BadHarper, Charles A, MD

## 2018-02-08 ENCOUNTER — Encounter: Payer: Self-pay | Admitting: Obstetrics and Gynecology

## 2018-02-09 ENCOUNTER — Other Ambulatory Visit: Payer: Self-pay | Admitting: Obstetrics

## 2018-02-09 DIAGNOSIS — Z3A18 18 weeks gestation of pregnancy: Secondary | ICD-10-CM

## 2018-02-09 DIAGNOSIS — Z362 Encounter for other antenatal screening follow-up: Secondary | ICD-10-CM

## 2018-02-09 DIAGNOSIS — Z8751 Personal history of pre-term labor: Secondary | ICD-10-CM

## 2018-02-17 ENCOUNTER — Ambulatory Visit (HOSPITAL_COMMUNITY)
Admission: RE | Admit: 2018-02-17 | Discharge: 2018-02-17 | Disposition: A | Discharge status: Home / Self Care | Payer: Medicaid Other | Source: Ambulatory Visit | Attending: Obstetrics | Admitting: Obstetrics

## 2018-02-21 ENCOUNTER — Other Ambulatory Visit (HOSPITAL_COMMUNITY): Payer: Self-pay

## 2018-03-02 ENCOUNTER — Other Ambulatory Visit (HOSPITAL_COMMUNITY): Payer: Self-pay | Admitting: *Deleted

## 2018-03-02 DIAGNOSIS — O09899 Supervision of other high risk pregnancies, unspecified trimester: Secondary | ICD-10-CM

## 2018-03-03 ENCOUNTER — Other Ambulatory Visit (HOSPITAL_COMMUNITY): Payer: Self-pay | Admitting: Obstetrics and Gynecology

## 2018-03-03 ENCOUNTER — Ambulatory Visit (HOSPITAL_COMMUNITY)
Admission: RE | Admit: 2018-03-03 | Discharge: 2018-03-03 | Disposition: A | Discharge status: Home / Self Care | Payer: Medicaid Other | Source: Ambulatory Visit | Attending: Obstetrics | Admitting: Obstetrics

## 2018-03-03 DIAGNOSIS — O09212 Supervision of pregnancy with history of pre-term labor, second trimester: Secondary | ICD-10-CM

## 2018-03-03 DIAGNOSIS — Q046 Congenital cerebral cysts: Secondary | ICD-10-CM | POA: Diagnosis not present

## 2018-03-03 DIAGNOSIS — Z3A20 20 weeks gestation of pregnancy: Secondary | ICD-10-CM | POA: Diagnosis not present

## 2018-03-03 DIAGNOSIS — O09892 Supervision of other high risk pregnancies, second trimester: Secondary | ICD-10-CM | POA: Diagnosis not present

## 2018-03-03 DIAGNOSIS — Z3689 Encounter for other specified antenatal screening: Secondary | ICD-10-CM

## 2018-03-03 DIAGNOSIS — O09899 Supervision of other high risk pregnancies, unspecified trimester: Secondary | ICD-10-CM

## 2018-03-03 DIAGNOSIS — Z3686 Encounter for antenatal screening for cervical length: Secondary | ICD-10-CM

## 2018-03-03 DIAGNOSIS — O283 Abnormal ultrasonic finding on antenatal screening of mother: Secondary | ICD-10-CM | POA: Insufficient documentation

## 2018-03-03 DIAGNOSIS — O09219 Supervision of pregnancy with history of pre-term labor, unspecified trimester: Secondary | ICD-10-CM

## 2018-03-03 NOTE — ED Notes (Signed)
Pt reports pain and burning with urination, feels "wet" x one week, having lower abd pressure.  Last 17P inj 02/07/18, missed her last appt.

## 2018-03-07 ENCOUNTER — Other Ambulatory Visit: Payer: Self-pay

## 2018-03-07 ENCOUNTER — Encounter: Payer: Self-pay | Admitting: Obstetrics

## 2018-03-07 ENCOUNTER — Ambulatory Visit (INDEPENDENT_AMBULATORY_CARE_PROVIDER_SITE_OTHER): Payer: Medicaid Other | Admitting: Obstetrics

## 2018-03-07 VITALS — BP 100/62 | HR 94 | Wt 130.7 lb

## 2018-03-07 DIAGNOSIS — O09212 Supervision of pregnancy with history of pre-term labor, second trimester: Secondary | ICD-10-CM

## 2018-03-07 DIAGNOSIS — O09219 Supervision of pregnancy with history of pre-term labor, unspecified trimester: Secondary | ICD-10-CM

## 2018-03-07 DIAGNOSIS — O09899 Supervision of other high risk pregnancies, unspecified trimester: Secondary | ICD-10-CM

## 2018-03-07 DIAGNOSIS — O0992 Supervision of high risk pregnancy, unspecified, second trimester: Secondary | ICD-10-CM

## 2018-03-07 DIAGNOSIS — O0990 Supervision of high risk pregnancy, unspecified, unspecified trimester: Secondary | ICD-10-CM

## 2018-03-07 NOTE — Progress Notes (Signed)
Subjective:  Maria Riley is a 27 y.o. [phone] at 8950w0d being seen today for ongoing prenatal care.  She is currently monitored for the following issues for this high risk pregnancy and has Supervision of high risk pregnancy, antepartum and History of preterm delivery on their problem list.  Patient reports vaginal discharge.  Contractions: Not present. Vag. Bleeding: None.  Movement: Present. Denies leaking of fluid.   The following portions of the patient's history were reviewed and updated as appropriate: allergies, current medications, past family history, past medical history, past social history, past surgical history and problem list. Problem list updated.  Objective:   Vitals:   03/07/18 1043  BP: 100/62  Pulse: 94  Weight: 130 lb 11.2 oz (59.3 kg)    Fetal Status:     Movement: Present     General:  Alert, oriented and cooperative. Patient is in no acute distress.  Skin: Skin is warm and dry. No rash noted.   Cardiovascular: Normal heart rate noted  Respiratory: Normal respiratory effort, no problems with respiration noted  Abdomen: Soft, gravid, appropriate for gestational age. Pain/Pressure: Absent     Pelvic:  Cervical exam deferred        Extremities: Normal range of motion.  Edema: None  Mental Status: Normal mood and affect. Normal behavior. Normal judgment and thought content.   Urinalysis:      Assessment and Plan:  Pregnancy: [id number] at 6350w0d  1. Supervision of high risk pregnancy, antepartum   2. History of preterm delivery, currently pregnant - Weekly 17-P   Preterm labor symptoms and general obstetric precautions including but not limited to vaginal bleeding, contractions, leaking of fluid and fetal movement were reviewed in detail with the patient. Please refer to After Visit Summary for other counseling recommendations.  Return in about 1 week (around 03/14/2018) for ROB.   Brock BadHarper, Charles A, MD

## 2018-03-07 NOTE — Progress Notes (Signed)
ROB/17P. Patient refused her 17P Injection today, said it gave her a headache and made her sick the last time.

## 2018-03-14 ENCOUNTER — Encounter: Payer: Self-pay | Admitting: Obstetrics

## 2018-03-14 ENCOUNTER — Ambulatory Visit (INDEPENDENT_AMBULATORY_CARE_PROVIDER_SITE_OTHER): Payer: Medicaid Other | Admitting: Obstetrics

## 2018-03-14 VITALS — BP 93/59 | HR 84 | Wt 136.2 lb

## 2018-03-14 DIAGNOSIS — O09899 Supervision of other high risk pregnancies, unspecified trimester: Secondary | ICD-10-CM

## 2018-03-14 DIAGNOSIS — O09219 Supervision of pregnancy with history of pre-term labor, unspecified trimester: Secondary | ICD-10-CM

## 2018-03-14 DIAGNOSIS — O09212 Supervision of pregnancy with history of pre-term labor, second trimester: Secondary | ICD-10-CM

## 2018-03-14 DIAGNOSIS — O0992 Supervision of high risk pregnancy, unspecified, second trimester: Secondary | ICD-10-CM

## 2018-03-14 DIAGNOSIS — O0990 Supervision of high risk pregnancy, unspecified, unspecified trimester: Secondary | ICD-10-CM

## 2018-03-14 MED ORDER — HYDROXYPROGESTERONE CAPROATE 250 MG/ML IM OIL: 250.0000 mg | TOPICAL_OIL | INTRAMUSCULAR | 4 refills | Status: DC

## 2018-03-14 MED ORDER — HYDROXYPROGESTERONE CAPROATE 250 MG/ML IM OIL
250.0000 mg | TOPICAL_OIL | Freq: Once | INTRAMUSCULAR | Status: AC
  Administered 2018-03-14: 250 mg via INTRAMUSCULAR

## 2018-03-14 NOTE — Progress Notes (Signed)
Subjective:  Maria Riley is a 27 y.o. [phone] at 6463w0d being seen today for ongoing prenatal care.  She is currently monitored for the following issues for this high-risk pregnancy and has Supervision of high risk pregnancy, antepartum and History of preterm delivery on their problem list.  Patient reports no complaints.  Contractions: Not present. Vag. Bleeding: None.  Movement: Present. Denies leaking of fluid.   The following portions of the patient's history were reviewed and updated as appropriate: allergies, current medications, past family history, past medical history, past social history, past surgical history and problem list. Problem list updated.  Objective:   Vitals:   03/14/18 1603  BP: (!) 93/59  Pulse: 84  Weight: 136 lb 3.2 oz (61.8 kg)    Fetal Status: Fetal Heart Rate (bpm): 150   Movement: Present     General:  Alert, oriented and cooperative. Patient is in no acute distress.  Skin: Skin is warm and dry. No rash noted.   Cardiovascular: Normal heart rate noted  Respiratory: Normal respiratory effort, no problems with respiration noted  Abdomen: Soft, gravid, appropriate for gestational age. Pain/Pressure: Absent     Pelvic:  Cervical exam deferred        Extremities: Normal range of motion.  Edema: None  Mental Status: Normal mood and affect. Normal behavior. Normal judgment and thought content.   Urinalysis:      Assessment and Plan:  Pregnancy: [id number] at 7263w0d  1. Supervision of high risk pregnancy, antepartum   2. History of preterm delivery, currently pregnant - weekly 17-P  Preterm labor symptoms and general obstetric precautions including but not limited to vaginal bleeding, contractions, leaking of fluid and fetal movement were reviewed in detail with the patient. Please refer to After Visit Summary for other counseling recommendations.  Return in about 1 week (around 03/21/2018) for ROB.  WEEKLY 17-p INJECTIONS.   Brock BadHarper, Ervie Mccard A, MD

## 2018-03-14 NOTE — Progress Notes (Signed)
Pt wishes to change SQ Makena to IM. Rx sent to [facility]" per protocol.

## 2018-03-14 NOTE — Addendum Note (Signed)
Addended by: Dalphine HandingGARDNER, Farida Mcreynolds L on: 03/14/2018 04:38 PM   Modules accepted: Orders

## 2018-03-14 NOTE — Addendum Note (Signed)
Addended by: Dalphine HandingGARDNER, Herberto Ledwell L on: 03/14/2018 05:02 PM   Modules accepted: Orders

## 2018-03-17 ENCOUNTER — Encounter (HOSPITAL_COMMUNITY): Payer: Self-pay

## 2018-03-17 ENCOUNTER — Other Ambulatory Visit (HOSPITAL_COMMUNITY): Payer: Self-pay | Admitting: Obstetrics and Gynecology

## 2018-03-17 ENCOUNTER — Ambulatory Visit (HOSPITAL_COMMUNITY)
Admission: RE | Admit: 2018-03-17 | Discharge: 2018-03-17 | Disposition: A | Discharge status: Home / Self Care | Payer: Medicaid Other | Source: Ambulatory Visit | Attending: Obstetrics | Admitting: Obstetrics

## 2018-03-17 DIAGNOSIS — O09892 Supervision of other high risk pregnancies, second trimester: Secondary | ICD-10-CM | POA: Insufficient documentation

## 2018-03-17 DIAGNOSIS — O09219 Supervision of pregnancy with history of pre-term labor, unspecified trimester: Secondary | ICD-10-CM

## 2018-03-17 DIAGNOSIS — Z3A22 22 weeks gestation of pregnancy: Secondary | ICD-10-CM

## 2018-03-17 DIAGNOSIS — O09212 Supervision of pregnancy with history of pre-term labor, second trimester: Secondary | ICD-10-CM | POA: Insufficient documentation

## 2018-03-17 DIAGNOSIS — O09899 Supervision of other high risk pregnancies, unspecified trimester: Secondary | ICD-10-CM

## 2018-03-21 ENCOUNTER — Telehealth: Payer: Self-pay

## 2018-03-21 NOTE — Telephone Encounter (Signed)
Pt did not like SQ 17p injections. TC to pt letting her know IM 17p is at the office now. Pt voiced gratitude and has no further questions.

## 2018-03-22 ENCOUNTER — Encounter: Payer: Self-pay | Admitting: Obstetrics

## 2018-03-31 ENCOUNTER — Encounter (HOSPITAL_COMMUNITY): Payer: Self-pay

## 2018-03-31 ENCOUNTER — Ambulatory Visit (HOSPITAL_COMMUNITY)
Admission: RE | Admit: 2018-03-31 | Discharge: 2018-03-31 | Disposition: A | Discharge status: Home / Self Care | Payer: Medicaid Other | Source: Ambulatory Visit | Attending: Obstetrics | Admitting: Obstetrics

## 2018-03-31 DIAGNOSIS — O09892 Supervision of other high risk pregnancies, second trimester: Secondary | ICD-10-CM | POA: Diagnosis not present

## 2018-03-31 DIAGNOSIS — O09212 Supervision of pregnancy with history of pre-term labor, second trimester: Secondary | ICD-10-CM | POA: Insufficient documentation

## 2018-03-31 DIAGNOSIS — O09219 Supervision of pregnancy with history of pre-term labor, unspecified trimester: Secondary | ICD-10-CM

## 2018-03-31 DIAGNOSIS — Z3A24 24 weeks gestation of pregnancy: Secondary | ICD-10-CM | POA: Diagnosis not present

## 2018-03-31 DIAGNOSIS — O09899 Supervision of other high risk pregnancies, unspecified trimester: Secondary | ICD-10-CM

## 2018-03-31 DIAGNOSIS — Z3686 Encounter for antenatal screening for cervical length: Secondary | ICD-10-CM | POA: Diagnosis not present

## 2018-04-05 ENCOUNTER — Encounter: Payer: Self-pay | Admitting: Obstetrics

## 2018-04-05 ENCOUNTER — Ambulatory Visit (INDEPENDENT_AMBULATORY_CARE_PROVIDER_SITE_OTHER): Payer: Medicaid Other | Admitting: Obstetrics

## 2018-04-05 VITALS — BP 95/60 | HR 80 | Wt 139.0 lb

## 2018-04-05 DIAGNOSIS — O09212 Supervision of pregnancy with history of pre-term labor, second trimester: Secondary | ICD-10-CM

## 2018-04-05 DIAGNOSIS — O0990 Supervision of high risk pregnancy, unspecified, unspecified trimester: Secondary | ICD-10-CM

## 2018-04-05 DIAGNOSIS — O09219 Supervision of pregnancy with history of pre-term labor, unspecified trimester: Secondary | ICD-10-CM

## 2018-04-05 DIAGNOSIS — O0992 Supervision of high risk pregnancy, unspecified, second trimester: Secondary | ICD-10-CM

## 2018-04-05 DIAGNOSIS — O09899 Supervision of other high risk pregnancies, unspecified trimester: Secondary | ICD-10-CM

## 2018-04-05 MED ORDER — HYDROXYPROGESTERONE CAPROATE 250 MG/ML IM OIL
250.0000 mg | TOPICAL_OIL | INTRAMUSCULAR | Status: DC
  Administered 2018-04-05 – 2018-05-31 (×7): 250 mg via INTRAMUSCULAR

## 2018-04-05 NOTE — Progress Notes (Signed)
Subjective:  Maria Riley is a 27 y.o. [phone] at [redacted]w[redacted]d being seen today for ongoing prenatal care.  She is currently monitored for the following issues for this high-risk pregnancy and has Supervision of high risk pregnancy, antepartum and History of preterm delivery on their problem list.  Patient reports no complaints.  Contractions: Not present. Vag. Bleeding: None.  Movement: Present. Denies leaking of fluid.   The following portions of the patient's history were reviewed and updated as appropriate: allergies, current medications, past family history, past medical history, past social history, past surgical history and problem list. Problem list updated.  Objective:   Vitals:   04/05/18 1607  BP: 95/60  Pulse: 80  Weight: 139 lb (63 kg)    Fetal Status:     Movement: Present     General:  Alert, oriented and cooperative. Patient is in no acute distress.  Skin: Skin is warm and dry. No rash noted.   Cardiovascular: Normal heart rate noted  Respiratory: Normal respiratory effort, no problems with respiration noted  Abdomen: Soft, gravid, appropriate for gestational age. Pain/Pressure: Absent     Pelvic:  Cervical exam deferred        Extremities: Normal range of motion.  Edema: None  Mental Status: Normal mood and affect. Normal behavior. Normal judgment and thought content.   Urinalysis:      Assessment and Plan:  Pregnancy: [id number] at [redacted]w[redacted]d  1. Supervision of high risk pregnancy, antepartum  2. History of preterm delivery, currently pregnant  Preterm labor symptoms and general obstetric precautions including but not limited to vaginal bleeding, contractions, leaking of fluid and fetal movement were reviewed in detail with the patient. Please refer to After Visit Summary for other counseling recommendations.  Return in about 3 weeks (around 04/26/2018) for ROB, 2 hour OGTT.   Brock Bad, MD

## 2018-04-05 NOTE — Progress Notes (Signed)
Patient reports fetal movement, denies pain. Administered 17-p and pt tolerated well, pt stated that she forgot about last week's appt.

## 2018-04-12 ENCOUNTER — Ambulatory Visit (INDEPENDENT_AMBULATORY_CARE_PROVIDER_SITE_OTHER): Payer: Medicaid Other

## 2018-04-12 DIAGNOSIS — O09899 Supervision of other high risk pregnancies, unspecified trimester: Secondary | ICD-10-CM

## 2018-04-12 DIAGNOSIS — O09212 Supervision of pregnancy with history of pre-term labor, second trimester: Secondary | ICD-10-CM | POA: Diagnosis not present

## 2018-04-12 DIAGNOSIS — O09219 Supervision of pregnancy with history of pre-term labor, unspecified trimester: Principal | ICD-10-CM

## 2018-04-13 NOTE — Progress Notes (Signed)
Pt. Presents for nurse visit- 17P injection given, pt tolerated well

## 2018-04-19 ENCOUNTER — Telehealth: Payer: Self-pay | Admitting: *Deleted

## 2018-04-19 ENCOUNTER — Ambulatory Visit (INDEPENDENT_AMBULATORY_CARE_PROVIDER_SITE_OTHER): Payer: Medicaid Other

## 2018-04-19 ENCOUNTER — Encounter: Payer: Self-pay | Admitting: *Deleted

## 2018-04-19 DIAGNOSIS — O09219 Supervision of pregnancy with history of pre-term labor, unspecified trimester: Principal | ICD-10-CM

## 2018-04-19 DIAGNOSIS — O09213 Supervision of pregnancy with history of pre-term labor, third trimester: Secondary | ICD-10-CM | POA: Diagnosis not present

## 2018-04-19 DIAGNOSIS — O09899 Supervision of other high risk pregnancies, unspecified trimester: Secondary | ICD-10-CM

## 2018-04-19 MED ORDER — HYDROXYPROGESTERONE CAPROATE 275 MG/1.1ML ~~LOC~~ SOAJ: 275.0000 mg | Freq: Once | SUBCUTANEOUS | Status: DC

## 2018-04-19 NOTE — Progress Notes (Signed)
Nurse visit for pt supplied 17p given L upper outer quad w/o difficulty.  

## 2018-04-19 NOTE — Telephone Encounter (Signed)
error 

## 2018-04-19 NOTE — Progress Notes (Signed)
I have reviewed the chart and agree with nursing staff's documentation of this patient's encounter.  Catalina Antigua, MD 04/19/2018 12:51 PM

## 2018-04-26 ENCOUNTER — Other Ambulatory Visit: Payer: Self-pay

## 2018-04-26 ENCOUNTER — Ambulatory Visit (INDEPENDENT_AMBULATORY_CARE_PROVIDER_SITE_OTHER): Payer: Medicaid Other | Admitting: Obstetrics

## 2018-04-26 ENCOUNTER — Encounter: Payer: Self-pay | Admitting: Obstetrics

## 2018-04-26 ENCOUNTER — Other Ambulatory Visit: Payer: Medicaid Other

## 2018-04-26 VITALS — BP 98/67 | HR 84 | Wt 139.4 lb

## 2018-04-26 DIAGNOSIS — R12 Heartburn: Secondary | ICD-10-CM

## 2018-04-26 DIAGNOSIS — O09213 Supervision of pregnancy with history of pre-term labor, third trimester: Secondary | ICD-10-CM

## 2018-04-26 DIAGNOSIS — O0990 Supervision of high risk pregnancy, unspecified, unspecified trimester: Secondary | ICD-10-CM

## 2018-04-26 DIAGNOSIS — Z23 Encounter for immunization: Secondary | ICD-10-CM | POA: Diagnosis not present

## 2018-04-26 DIAGNOSIS — O0993 Supervision of high risk pregnancy, unspecified, third trimester: Secondary | ICD-10-CM

## 2018-04-26 DIAGNOSIS — O26893 Other specified pregnancy related conditions, third trimester: Secondary | ICD-10-CM

## 2018-04-26 DIAGNOSIS — O26899 Other specified pregnancy related conditions, unspecified trimester: Secondary | ICD-10-CM

## 2018-04-26 MED ORDER — RANITIDINE HCL 150 MG PO TABS: 150.0000 mg | ORAL_TABLET | Freq: Two times a day (BID) | ORAL | 5 refills | Status: DC

## 2018-04-26 NOTE — Telephone Encounter (Signed)
Maria Riley refilled [facility]

## 2018-04-26 NOTE — Progress Notes (Addendum)
Pt reports fetal movement with occasional pressure. Administered 17-p and tdap, pt tolerated well.

## 2018-04-26 NOTE — Progress Notes (Signed)
Subjective:  Maria Riley is a 27 y.o. [phone] at [redacted]w[redacted]d being seen today for ongoing prenatal care.  She is currently monitored for the following issues for this high-risk pregnancy and has Supervision of high risk pregnancy, antepartum and History of preterm delivery on their problem list.  Patient reports heartburn.  Contractions: Not present. Vag. Bleeding: None.  Movement: Present. Denies leaking of fluid.   The following portions of the patient's history were reviewed and updated as appropriate: allergies, current medications, past family history, past medical history, past social history, past surgical history and problem list. Problem list updated.  Objective:   Vitals:   04/26/18 0910  BP: 98/67  Pulse: 84  Weight: 139 lb 6.4 oz (63.2 kg)    Fetal Status: Fetal Heart Rate (bpm): 150   Movement: Present     General:  Alert, oriented and cooperative. Patient is in no acute distress.  Skin: Skin is warm and dry. No rash noted.   Cardiovascular: Normal heart rate noted  Respiratory: Normal respiratory effort, no problems with respiration noted  Abdomen: Soft, gravid, appropriate for gestational age. Pain/Pressure: Present     Pelvic:  Cervical exam deferred        Extremities: Normal range of motion.  Edema: None  Mental Status: Normal mood and affect. Normal behavior. Normal judgment and thought content.   Urinalysis:      Assessment and Plan:  Pregnancy: A5W0981 at 106w1d  1. Supervision of high risk pregnancy, antepartum Rx: - Glucose Tolerance, 2 Hours w/1 Hour - HIV antibody (with reflex) - RPR - CBC  2. Heartburn during pregnancy, antepartum Rx: - ranitidine (ZANTAC) 150 MG tablet; Take 1 tablet (150 mg total) by mouth 2 (two) times daily.  Dispense: 60 tablet; Refill: 5  Preterm labor symptoms and general obstetric precautions including but not limited to vaginal bleeding, contractions, leaking of fluid and fetal movement were reviewed in detail with the  patient. Please refer to After Visit Summary for other counseling recommendations.  Return in about 1 week (around 05/03/2018) for ROB.   Brock Bad, MD

## 2018-04-27 LAB — CBC
HEMATOCRIT: 36.6 % (ref 34.0–46.6)
HEMOGLOBIN: 11.9 g/dL (ref 11.1–15.9)
MCH: 28.5 pg (ref 26.6–33.0)
MCHC: 32.5 g/dL (ref 31.5–35.7)
MCV: 88 fL (ref 79–97)
Platelets: 212 10*3/uL (ref 150–450)
RBC: 4.17 x10E6/uL (ref 3.77–5.28)
RDW: 14.4 % (ref 12.3–15.4)
WBC: 6.2 10*3/uL (ref 3.4–10.8)

## 2018-04-27 LAB — RPR: RPR: NONREACTIVE

## 2018-04-27 LAB — GLUCOSE TOLERANCE, 2 HOURS W/ 1HR
GLUCOSE, FASTING: 69 mg/dL (ref 65–91)
Glucose, 1 hour: 57 mg/dL — ABNORMAL LOW (ref 65–179)
Glucose, 2 hour: 77 mg/dL (ref 65–152)

## 2018-04-27 LAB — HIV ANTIBODY (ROUTINE TESTING W REFLEX): HIV SCREEN 4TH GENERATION: NONREACTIVE

## 2018-05-03 ENCOUNTER — Encounter: Payer: Self-pay | Admitting: Obstetrics

## 2018-05-03 ENCOUNTER — Ambulatory Visit (INDEPENDENT_AMBULATORY_CARE_PROVIDER_SITE_OTHER): Payer: Medicaid Other | Admitting: Obstetrics

## 2018-05-03 ENCOUNTER — Other Ambulatory Visit (HOSPITAL_COMMUNITY)
Admission: RE | Admit: 2018-05-03 | Discharge: 2018-05-03 | Disposition: A | Discharge status: Home / Self Care | Payer: Medicaid Other | Source: Ambulatory Visit | Attending: Obstetrics | Admitting: Obstetrics

## 2018-05-03 ENCOUNTER — Ambulatory Visit: Payer: Medicaid Other

## 2018-05-03 VITALS — BP 96/63 | HR 87 | Wt 139.6 lb

## 2018-05-03 DIAGNOSIS — O0990 Supervision of high risk pregnancy, unspecified, unspecified trimester: Secondary | ICD-10-CM

## 2018-05-03 DIAGNOSIS — O09213 Supervision of pregnancy with history of pre-term labor, third trimester: Secondary | ICD-10-CM

## 2018-05-03 DIAGNOSIS — N898 Other specified noninflammatory disorders of vagina: Secondary | ICD-10-CM

## 2018-05-03 DIAGNOSIS — O0993 Supervision of high risk pregnancy, unspecified, third trimester: Secondary | ICD-10-CM

## 2018-05-03 DIAGNOSIS — O09899 Supervision of other high risk pregnancies, unspecified trimester: Secondary | ICD-10-CM

## 2018-05-03 DIAGNOSIS — O09219 Supervision of pregnancy with history of pre-term labor, unspecified trimester: Secondary | ICD-10-CM

## 2018-05-03 NOTE — Progress Notes (Signed)
See previous note from today.

## 2018-05-03 NOTE — Progress Notes (Signed)
Subjective:  Maria Riley is a 27 y.o. [phone] at [redacted]w[redacted]d being seen today for ongoing prenatal care.  She is currently monitored for the following issues for this high-risk pregnancy and has Supervision of high risk pregnancy, antepartum and History of preterm delivery on their problem list.  Patient reports vaginal discharge.  Contractions: Not present. Vag. Bleeding: None.  Movement: Present. Denies leaking of fluid.   The following portions of the patient's history were reviewed and updated as appropriate: allergies, current medications, past family history, past medical history, past social history, past surgical history and problem list. Problem list updated.  Objective:   Vitals:   05/03/18 0945  BP: 96/63  Pulse: 87  Weight: 139 lb 9.6 oz (63.3 kg)    Fetal Status: Fetal Heart Rate (bpm): 150   Movement: Present     General:  Alert, oriented and cooperative. Patient is in no acute distress.  Skin: Skin is warm and dry. No rash noted.   Cardiovascular: Normal heart rate noted  Respiratory: Normal respiratory effort, no problems with respiration noted  Abdomen: Soft, gravid, appropriate for gestational age. Pain/Pressure: Present     Pelvic:  Cervical exam deferred        Extremities: Normal range of motion.  Edema: None  Mental Status: Normal mood and affect. Normal behavior. Normal judgment and thought content.   Urinalysis:      Assessment and Plan:  Pregnancy: [id number] at [redacted]w[redacted]d  1. Supervision of high risk pregnancy, antepartum  2. Vaginal discharge Rx: - Cervicovaginal ancillary only  3. History of preterm delivery, currently pregnant - weekly 17-P  Preterm labor symptoms and general obstetric precautions including but not limited to vaginal bleeding, contractions, leaking of fluid and fetal movement were reviewed in detail with the patient. Please refer to After Visit Summary for other counseling recommendations.  Return in about 1 week (around 05/10/2018) for  ROB.   Brock Bad, MD

## 2018-05-03 NOTE — Progress Notes (Signed)
Patient reports continuous back pain especially when moving from sitting to standing position. Pt reports good fetal movement, complains of vaginal irritation and discharge. Pt also has questions about BTL procedure before she signs paperwork. Administered 17-p and pt tolerated well.

## 2018-05-04 LAB — CERVICOVAGINAL ANCILLARY ONLY
Bacterial vaginitis: NEGATIVE
Candida vaginitis: NEGATIVE
Trichomonas: NEGATIVE

## 2018-05-10 ENCOUNTER — Encounter: Payer: Self-pay | Admitting: Obstetrics

## 2018-05-10 ENCOUNTER — Ambulatory Visit (INDEPENDENT_AMBULATORY_CARE_PROVIDER_SITE_OTHER): Payer: Medicaid Other | Admitting: Obstetrics

## 2018-05-10 VITALS — BP 107/72 | HR 89 | Wt 140.2 lb

## 2018-05-10 DIAGNOSIS — O09213 Supervision of pregnancy with history of pre-term labor, third trimester: Secondary | ICD-10-CM

## 2018-05-10 DIAGNOSIS — O09219 Supervision of pregnancy with history of pre-term labor, unspecified trimester: Secondary | ICD-10-CM

## 2018-05-10 DIAGNOSIS — O0990 Supervision of high risk pregnancy, unspecified, unspecified trimester: Secondary | ICD-10-CM

## 2018-05-10 DIAGNOSIS — O0993 Supervision of high risk pregnancy, unspecified, third trimester: Secondary | ICD-10-CM

## 2018-05-10 DIAGNOSIS — O09899 Supervision of other high risk pregnancies, unspecified trimester: Secondary | ICD-10-CM

## 2018-05-10 MED ORDER — HYDROXYPROGESTERONE CAPROATE 250 MG/ML IM OIL
250.0000 mg | TOPICAL_OIL | Freq: Once | INTRAMUSCULAR | Status: AC
  Administered 2018-05-10: 250 mg via INTRAMUSCULAR

## 2018-05-10 NOTE — Progress Notes (Signed)
17 P given w/ no problems.

## 2018-05-10 NOTE — Progress Notes (Signed)
Subjective:  Maria Riley is a 27 y.o. [phone] at 169w1d being seen today for ongoing prenatal care.  She is currently monitored for the following issues for this high-risk pregnancy and has Supervision of high risk pregnancy, antepartum and History of preterm delivery on their problem list.  Patient reports no complaints.  Contractions: Not present. Vag. Bleeding: None.  Movement: Present. Denies leaking of fluid.   The following portions of the patient's history were reviewed and updated as appropriate: allergies, current medications, past family history, past medical history, past social history, past surgical history and problem list. Problem list updated.  Objective:   Vitals:   05/10/18 1014  BP: 107/72  Pulse: 89  Weight: 140 lb 3.2 oz (63.6 kg)    Fetal Status: Fetal Heart Rate (bpm): 150   Movement: Present     General:  Alert, oriented and cooperative. Patient is in no acute distress.  Skin: Skin is warm and dry. No rash noted.   Cardiovascular: Normal heart rate noted  Respiratory: Normal respiratory effort, no problems with respiration noted  Abdomen: Soft, gravid, appropriate for gestational age. Pain/Pressure: Absent     Pelvic:  Cervical exam deferred        Extremities: Normal range of motion.  Edema: None  Mental Status: Normal mood and affect. Normal behavior. Normal judgment and thought content.   Urinalysis:      Assessment and Plan:  Pregnancy: [id number] at 7469w1d  1. Supervision of high risk pregnancy, antepartum - doing well  2. History of preterm delivery, currently pregnant Rx: - hydroxyprogesterone caproate (MAKENA) 250 mg/mL injection 250 mg  Preterm labor symptoms and general obstetric precautions including but not limited to vaginal bleeding, contractions, leaking of fluid and fetal movement were reviewed in detail with the patient. Please refer to After Visit Summary for other counseling recommendations.  Return in about 2 weeks (around 05/24/2018)  for ROB.   Brock BadHarper, Daric Koren A, MD

## 2018-05-17 ENCOUNTER — Ambulatory Visit: Payer: Medicaid Other

## 2018-05-18 ENCOUNTER — Ambulatory Visit (INDEPENDENT_AMBULATORY_CARE_PROVIDER_SITE_OTHER): Payer: Medicaid Other | Admitting: *Deleted

## 2018-05-18 VITALS — BP 103/70 | HR 92 | Wt 142.0 lb

## 2018-05-18 DIAGNOSIS — O0990 Supervision of high risk pregnancy, unspecified, unspecified trimester: Secondary | ICD-10-CM

## 2018-05-18 DIAGNOSIS — O09213 Supervision of pregnancy with history of pre-term labor, third trimester: Secondary | ICD-10-CM | POA: Diagnosis not present

## 2018-05-18 NOTE — Progress Notes (Signed)
Pt is in office for 17p injection. Pt tolerated injection well. Pt also resigned BTL papers.  Pt has no other concerns today.  BP 103/70   Pulse 92   Wt 142 lb (64.4 kg)   LMP 10/11/2017   BMI 28.68 kg/m   Administrations This Visit    hydroxyprogesterone caproate (MAKENA) 250 mg/mL injection 250 mg    Admin Date 05/18/2018 Action Given Dose 250 mg Route Intramuscular Administered By Lanney GinsFoster, Nova Schmuhl D, CMA

## 2018-05-18 NOTE — Progress Notes (Signed)
I have reviewed this chart and agree with the RN/CMA assessment and management.    K. Meryl Davis, M.D. Attending Obstetrician & Gynecologist, [facility], [facility]  

## 2018-05-24 ENCOUNTER — Encounter: Payer: Self-pay | Admitting: Obstetrics & Gynecology

## 2018-05-24 ENCOUNTER — Ambulatory Visit (INDEPENDENT_AMBULATORY_CARE_PROVIDER_SITE_OTHER): Payer: Medicaid Other | Admitting: Obstetrics & Gynecology

## 2018-05-24 VITALS — BP 92/64 | HR 82 | Wt 144.5 lb

## 2018-05-24 DIAGNOSIS — O0990 Supervision of high risk pregnancy, unspecified, unspecified trimester: Secondary | ICD-10-CM

## 2018-05-24 DIAGNOSIS — Z8751 Personal history of pre-term labor: Secondary | ICD-10-CM

## 2018-05-24 DIAGNOSIS — O0993 Supervision of high risk pregnancy, unspecified, third trimester: Secondary | ICD-10-CM

## 2018-05-24 MED ORDER — COMFORT FIT MATERNITY SUPP LG MISC: 1.0000 [IU] | Freq: Every day | 0 refills | Status: DC

## 2018-05-24 MED ORDER — HYDROXYPROGESTERONE CAPROATE 275 MG/1.1ML ~~LOC~~ SOAJ: 275.0000 mg | SUBCUTANEOUS | Status: DC

## 2018-05-24 NOTE — Progress Notes (Signed)
   PRENATAL VISIT NOTE  Subjective:  Maria Riley is a 27 y.o. [phone] at 3334w1d being seen today for ongoing prenatal care.  She is currently monitored for the following issues for this high-risk pregnancy and has Supervision of high risk pregnancy, antepartum and History of preterm delivery on their problem list.  Patient reports backache and pressure.  Contractions: Not present. Vag. Bleeding: None.  Movement: Present. Denies leaking of fluid.   The following portions of the patient's history were reviewed and updated as appropriate: allergies, current medications, past family history, past medical history, past social history, past surgical history and problem list. Problem list updated.  Objective:   Vitals:   05/24/18 1308  BP: 92/64  Pulse: 82  Weight: 144 lb 8 oz (65.5 kg)    Fetal Status: Fetal Heart Rate (bpm): 148   Movement: Present     General:  Alert, oriented and cooperative. Patient is in no acute distress.  Skin: Skin is warm and dry. No rash noted.   Cardiovascular: Normal heart rate noted  Respiratory: Normal respiratory effort, no problems with respiration noted  Abdomen: Soft, gravid, appropriate for gestational age.  Pain/Pressure: Present     Pelvic: Cervical exam deferred        Extremities: Normal range of motion.  Edema: None  Mental Status: Normal mood and affect. Normal behavior. Normal judgment and thought content.   Assessment and Plan:  Pregnancy: [id number] at 634w1d  1. Supervision of high risk pregnancy, antepartum Try support belt for discomforts - Elastic Bandages & Supports (COMFORT FIT MATERNITY SUPP LG) MISC; 1 Units by Does not apply route daily.  Dispense: 1 each; Refill: 0  2. History of preterm labor Continue 17 P - Elastic Bandages & Supports (COMFORT FIT MATERNITY SUPP LG) MISC; 1 Units by Does not apply route daily.  Dispense: 1 each; Refill: 0  Preterm labor symptoms and general obstetric precautions including but not limited to  vaginal bleeding, contractions, leaking of fluid and fetal movement were reviewed in detail with the patient. Please refer to After Visit Summary for other counseling recommendations.  Return in about 2 weeks (around 06/07/2018).  No future appointments.  Scheryl DarterJames Kesa Birky, MD

## 2018-05-24 NOTE — Progress Notes (Signed)
Room 1  

## 2018-05-24 NOTE — Patient Instructions (Signed)

## 2018-05-31 ENCOUNTER — Ambulatory Visit (INDEPENDENT_AMBULATORY_CARE_PROVIDER_SITE_OTHER): Payer: Medicaid Other | Admitting: *Deleted

## 2018-05-31 VITALS — BP 97/63 | HR 88 | Wt 144.0 lb

## 2018-05-31 DIAGNOSIS — O09213 Supervision of pregnancy with history of pre-term labor, third trimester: Secondary | ICD-10-CM | POA: Diagnosis not present

## 2018-05-31 DIAGNOSIS — O0990 Supervision of high risk pregnancy, unspecified, unspecified trimester: Secondary | ICD-10-CM

## 2018-05-31 NOTE — Progress Notes (Signed)
Pt is in office for 17p injection.   Pt tolerated injection well. Pt has no other concerns today.  BP 97/63   Pulse 88   Wt 144 lb (65.3 kg)   LMP 10/11/2017   BMI 29.08 kg/m   Administrations This Visit    hydroxyprogesterone caproate (MAKENA) 250 mg/mL injection 250 mg    Admin Date 05/31/2018 Action Given Dose 250 mg Route Intramuscular Administered By Lanney GinsFoster, Rache Klimaszewski D, CMA

## 2018-06-02 NOTE — Progress Notes (Signed)
I have reviewed this chart and agree with the RN/CMA assessment and management.    K. Meryl Davis, M.D. Attending Obstetrician & Gynecologist, [facility], [facility]  

## 2018-06-07 ENCOUNTER — Encounter: Payer: Self-pay | Admitting: Certified Nurse Midwife

## 2018-06-07 ENCOUNTER — Encounter: Payer: Self-pay | Admitting: Obstetrics

## 2018-06-07 ENCOUNTER — Ambulatory Visit (INDEPENDENT_AMBULATORY_CARE_PROVIDER_SITE_OTHER): Payer: Medicaid Other | Admitting: Certified Nurse Midwife

## 2018-06-07 ENCOUNTER — Encounter: Payer: Self-pay | Admitting: Obstetrics and Gynecology

## 2018-06-07 VITALS — BP 106/73 | HR 74 | Wt 145.0 lb

## 2018-06-07 DIAGNOSIS — O0993 Supervision of high risk pregnancy, unspecified, third trimester: Secondary | ICD-10-CM

## 2018-06-07 DIAGNOSIS — Z8751 Personal history of pre-term labor: Secondary | ICD-10-CM

## 2018-06-07 DIAGNOSIS — O09213 Supervision of pregnancy with history of pre-term labor, third trimester: Secondary | ICD-10-CM

## 2018-06-07 DIAGNOSIS — O0990 Supervision of high risk pregnancy, unspecified, unspecified trimester: Secondary | ICD-10-CM

## 2018-06-07 MED ORDER — HYDROXYPROGESTERONE CAPROATE 250 MG/ML IM OIL
250.0000 mg | TOPICAL_OIL | Freq: Once | INTRAMUSCULAR | Status: AC
  Administered 2018-06-07: 250 mg via INTRAMUSCULAR

## 2018-06-07 NOTE — Progress Notes (Signed)
   PRENATAL VISIT NOTE  Subjective:  Maria Riley is a 27 y.o. [phone] at 291w1d being seen today for ongoing prenatal care.  She is currently monitored for the following issues for this high-risk pregnancy and has Supervision of high risk pregnancy, antepartum and History of preterm delivery on their problem list.  Patient reports no complaints.  Contractions: Not present. Vag. Bleeding: None.  Movement: Present. Denies leaking of fluid.   The following portions of the patient's history were reviewed and updated as appropriate: allergies, current medications, past family history, past medical history, past social history, past surgical history and problem list. Problem list updated.  Objective:   Vitals:   06/07/18 1530  BP: 106/73  Pulse: 74  Weight: 145 lb (65.8 kg)    Fetal Status: Fetal Heart Rate (bpm): 154; doppler Fundal Height: 33 cm Movement: Present     General:  Alert, oriented and cooperative. Patient is in no acute distress.  Skin: Skin is warm and dry. No rash noted.   Cardiovascular: Normal heart rate noted  Respiratory: Normal respiratory effort, no problems with respiration noted  Abdomen: Soft, gravid, appropriate for gestational age.  Pain/Pressure: Present     Pelvic: Cervical exam deferred        Extremities: Normal range of motion.  Edema: None  Mental Status: Normal mood and affect. Normal behavior. Normal judgment and thought content.   Assessment and Plan:  Pregnancy: [id number] at 681w1d  1. Supervision of high risk pregnancy, antepartum      Doing well.  - hydroxyprogesterone caproate (MAKENA) 250 mg/mL injection 250 mg  2. History of preterm labor       - hydroxyprogesterone caproate (MAKENA) 250 mg/mL injection 250 mg  3. History of preterm delivery       Preterm labor symptoms and general obstetric precautions including but not limited to vaginal bleeding, contractions, leaking of fluid and fetal movement were reviewed in detail with the  patient. Please refer to After Visit Summary for other counseling recommendations.  Return in about 1 week (around 06/14/2018) for HOB, 17-P, GBS.  No future appointments.  Roe Coombsachelle A Requan Hardge, CNM

## 2018-06-13 ENCOUNTER — Encounter (HOSPITAL_COMMUNITY)
Admission: AD | Disposition: A | Discharge status: Home / Self Care | Payer: Self-pay | Source: Home / Self Care | Attending: Obstetrics and Gynecology

## 2018-06-13 ENCOUNTER — Inpatient Hospital Stay (HOSPITAL_COMMUNITY)
Admission: AD | Admit: 2018-06-13 | Discharge: 2018-06-17 | DRG: 786 | Disposition: A | Discharge status: Home / Self Care | Payer: Medicaid Other | Attending: Obstetrics and Gynecology | Admitting: Obstetrics and Gynecology

## 2018-06-13 ENCOUNTER — Inpatient Hospital Stay (HOSPITAL_COMMUNITY): Payer: Medicaid Other | Admitting: Certified Registered Nurse Anesthetist

## 2018-06-13 ENCOUNTER — Inpatient Hospital Stay (HOSPITAL_COMMUNITY): Payer: Medicaid Other

## 2018-06-13 DIAGNOSIS — O45023 Premature separation of placenta with disseminated intravascular coagulation, third trimester: Principal | ICD-10-CM | POA: Diagnosis present

## 2018-06-13 DIAGNOSIS — Z3A35 35 weeks gestation of pregnancy: Secondary | ICD-10-CM

## 2018-06-13 DIAGNOSIS — O4593 Premature separation of placenta, unspecified, third trimester: Secondary | ICD-10-CM

## 2018-06-13 DIAGNOSIS — O0990 Supervision of high risk pregnancy, unspecified, unspecified trimester: Secondary | ICD-10-CM

## 2018-06-13 DIAGNOSIS — D649 Anemia, unspecified: Secondary | ICD-10-CM | POA: Diagnosis present

## 2018-06-13 DIAGNOSIS — R109 Unspecified abdominal pain: Secondary | ICD-10-CM | POA: Diagnosis present

## 2018-06-13 DIAGNOSIS — O9902 Anemia complicating childbirth: Secondary | ICD-10-CM | POA: Diagnosis present

## 2018-06-13 DIAGNOSIS — Z789 Other specified health status: Secondary | ICD-10-CM | POA: Diagnosis present

## 2018-06-13 DIAGNOSIS — D65 Disseminated intravascular coagulation [defibrination syndrome]: Secondary | ICD-10-CM | POA: Clinically undetermined

## 2018-06-13 DIAGNOSIS — O4590 Premature separation of placenta, unspecified, unspecified trimester: Secondary | ICD-10-CM | POA: Diagnosis present

## 2018-06-13 LAB — CBC
HEMATOCRIT: 36.2 % (ref 36.0–46.0)
HEMATOCRIT: 27 % — AB (ref 36.0–46.0)
HEMOGLOBIN: 12.4 g/dL (ref 12.0–15.0)
Hemoglobin: 9.8 g/dL — ABNORMAL LOW (ref 12.0–15.0)
MCH: 29.5 pg (ref 26.0–34.0)
MCH: 31.8 pg (ref 26.0–34.0)
MCHC: 34.3 g/dL (ref 30.0–36.0)
MCHC: 36.3 g/dL — AB (ref 30.0–36.0)
MCV: 86.2 fL (ref 78.0–100.0)
MCV: 87.7 fL (ref 78.0–100.0)
Platelets: 101 10*3/uL — ABNORMAL LOW (ref 150–400)
Platelets: 62 10*3/uL — ABNORMAL LOW (ref 150–400)
RBC: 4.2 MIL/uL (ref 3.87–5.11)
RBC: 3.08 MIL/uL — ABNORMAL LOW (ref 3.87–5.11)
RDW: 13.4 % (ref 11.5–15.5)
RDW: 14.1 % (ref 11.5–15.5)
WBC: 10.7 10*3/uL — AB (ref 4.0–10.5)
WBC: 12.5 10*3/uL — ABNORMAL HIGH (ref 4.0–10.5)

## 2018-06-13 LAB — POCT I-STAT EG7
Acid-base deficit: 3 mmol/L — ABNORMAL HIGH (ref 0.0–2.0)
Bicarbonate: 22 mmol/L (ref 20.0–28.0)
Calcium, Ion: 1.29 mmol/L (ref 1.15–1.40)
HEMATOCRIT: 19 % — AB (ref 36.0–46.0)
HEMOGLOBIN: 6.5 g/dL — AB (ref 12.0–15.0)
O2 SAT: 91 %
PCO2 VEN: 37.2 mmHg — AB (ref 44.0–60.0)
PH VEN: 7.379 (ref 7.250–7.430)
POTASSIUM: 4.1 mmol/L (ref 3.5–5.1)
SODIUM: 138 mmol/L (ref 135–145)
TCO2: 23 mmol/L (ref 22–32)
pO2, Ven: 63 mmHg — ABNORMAL HIGH (ref 32.0–45.0)

## 2018-06-13 LAB — PREPARE RBC (CROSSMATCH)

## 2018-06-13 LAB — D-DIMER, QUANTITATIVE: D-Dimer, Quant: >20 ug/mL-FEU — ABNORMAL HIGH (ref 0.00–0.50)

## 2018-06-13 LAB — PROTIME-INR
INR: 2.88
PROTHROMBIN TIME: 30 s — AB (ref 11.4–15.2)

## 2018-06-13 LAB — APTT: APTT: 50 s — AB (ref 24–36)

## 2018-06-13 LAB — FIBRINOGEN

## 2018-06-13 SURGERY — Surgical Case
Anesthesia: General

## 2018-06-13 MED ORDER — FENTANYL CITRATE (PF) 100 MCG/2ML IJ SOLN
INTRAMUSCULAR | Status: DC | PRN
  Administered 2018-06-13: 100 ug via INTRAVENOUS
  Administered 2018-06-13: 50 ug via INTRAVENOUS
  Administered 2018-06-13: 250 ug via INTRAVENOUS

## 2018-06-13 MED ORDER — ALBUMIN HUMAN 5 % IV SOLN
INTRAVENOUS | Status: DC | PRN
  Administered 2018-06-13 (×2): via INTRAVENOUS

## 2018-06-13 MED ORDER — OXYTOCIN 10 UNIT/ML IJ SOLN
INTRAMUSCULAR | Status: AC
  Filled 2018-06-13: qty 4

## 2018-06-13 MED ORDER — DEXAMETHASONE SODIUM PHOSPHATE 10 MG/ML IJ SOLN
INTRAMUSCULAR | Status: DC | PRN
  Administered 2018-06-13: 4 mg via INTRAVENOUS

## 2018-06-13 MED ORDER — MIDAZOLAM HCL 2 MG/2ML IJ SOLN: 0.5000 mg | Freq: Once | INTRAMUSCULAR | Status: DC | PRN

## 2018-06-13 MED ORDER — SODIUM CHLORIDE 0.9 % IR SOLN
Status: DC | PRN
  Administered 2018-06-13 (×2): 1

## 2018-06-13 MED ORDER — SUGAMMADEX SODIUM 500 MG/5ML IV SOLN
INTRAVENOUS | Status: DC | PRN
  Administered 2018-06-13: 200 mg via INTRAVENOUS

## 2018-06-13 MED ORDER — PHENYLEPHRINE 40 MCG/ML (10ML) SYRINGE FOR IV PUSH (FOR BLOOD PRESSURE SUPPORT)
PREFILLED_SYRINGE | INTRAVENOUS | Status: AC
  Filled 2018-06-13: qty 30

## 2018-06-13 MED ORDER — SODIUM BICARBONATE 8.4 % IV SOLN
INTRAVENOUS | Status: AC
  Filled 2018-06-13: qty 50

## 2018-06-13 MED ORDER — HYDROMORPHONE HCL 1 MG/ML IJ SOLN
INTRAMUSCULAR | Status: AC
  Filled 2018-06-13: qty 1

## 2018-06-13 MED ORDER — FENTANYL CITRATE (PF) 250 MCG/5ML IJ SOLN
INTRAMUSCULAR | Status: AC
  Filled 2018-06-13: qty 5

## 2018-06-13 MED ORDER — ROCURONIUM BROMIDE 100 MG/10ML IV SOLN
INTRAVENOUS | Status: AC
  Filled 2018-06-13: qty 1

## 2018-06-13 MED ORDER — PHENYLEPHRINE 40 MCG/ML (10ML) SYRINGE FOR IV PUSH (FOR BLOOD PRESSURE SUPPORT)
PREFILLED_SYRINGE | INTRAVENOUS | Status: AC
  Filled 2018-06-13: qty 60

## 2018-06-13 MED ORDER — ONDANSETRON HCL 4 MG/2ML IJ SOLN
INTRAMUSCULAR | Status: AC
  Filled 2018-06-13: qty 2

## 2018-06-13 MED ORDER — ONDANSETRON HCL 4 MG/2ML IJ SOLN
INTRAMUSCULAR | Status: DC | PRN
  Administered 2018-06-13: 4 mg via INTRAVENOUS

## 2018-06-13 MED ORDER — SUCCINYLCHOLINE CHLORIDE 200 MG/10ML IV SOSY
PREFILLED_SYRINGE | INTRAVENOUS | Status: AC
  Filled 2018-06-13: qty 10

## 2018-06-13 MED ORDER — MIDAZOLAM HCL 2 MG/2ML IJ SOLN
INTRAMUSCULAR | Status: DC | PRN
  Administered 2018-06-13: 2 mg via INTRAVENOUS

## 2018-06-13 MED ORDER — SUGAMMADEX SODIUM 500 MG/5ML IV SOLN
INTRAVENOUS | Status: AC
  Filled 2018-06-13: qty 5

## 2018-06-13 MED ORDER — FAMOTIDINE IN NACL 20-0.9 MG/50ML-% IV SOLN
INTRAVENOUS | Status: AC
  Filled 2018-06-13: qty 50

## 2018-06-13 MED ORDER — MORPHINE SULFATE (PF) 0.5 MG/ML IJ SOLN
INTRAMUSCULAR | Status: AC
  Filled 2018-06-13: qty 10

## 2018-06-13 MED ORDER — SUCCINYLCHOLINE CHLORIDE 20 MG/ML IJ SOLN
INTRAMUSCULAR | Status: DC | PRN
  Administered 2018-06-13: 100 mg via INTRAVENOUS

## 2018-06-13 MED ORDER — LACTATED RINGERS IV SOLN
INTRAVENOUS | Status: DC | PRN
  Administered 2018-06-13 (×2): via INTRAVENOUS

## 2018-06-13 MED ORDER — PROPOFOL 10 MG/ML IV BOLUS
INTRAVENOUS | Status: DC | PRN
  Administered 2018-06-13: 130 mg via INTRAVENOUS

## 2018-06-13 MED ORDER — SODIUM CHLORIDE 0.9% IV SOLUTION: Freq: Once | INTRAVENOUS | Status: DC

## 2018-06-13 MED ORDER — TRANEXAMIC ACID 1000 MG/10ML IV SOLN
1000.0000 mg | INTRAVENOUS | Status: DC
  Filled 2018-06-13: qty 10

## 2018-06-13 MED ORDER — SODIUM BICARBONATE 8.4 % IV SOLN
INTRAVENOUS | Status: DC | PRN
  Administered 2018-06-13 (×2): 25 mL via INTRAVENOUS

## 2018-06-13 MED ORDER — SODIUM CHLORIDE 0.9 % IV SOLN
500.0000 mg | Freq: Once | INTRAVENOUS | Status: AC
  Administered 2018-06-13: 500 mg via INTRAVENOUS
  Filled 2018-06-13: qty 500

## 2018-06-13 MED ORDER — CEFAZOLIN SODIUM-DEXTROSE 2-4 GM/100ML-% IV SOLN
INTRAVENOUS | Status: AC
  Filled 2018-06-13: qty 100

## 2018-06-13 MED ORDER — MORPHINE SULFATE (PF) 4 MG/ML IV SOLN
INTRAVENOUS | Status: AC
  Administered 2018-06-13: 2 mg via INTRAVENOUS
  Filled 2018-06-13: qty 1

## 2018-06-13 MED ORDER — DEXAMETHASONE SODIUM PHOSPHATE 10 MG/ML IJ SOLN
INTRAMUSCULAR | Status: AC
  Filled 2018-06-13: qty 2

## 2018-06-13 MED ORDER — PROPOFOL 10 MG/ML IV BOLUS
INTRAVENOUS | Status: AC
  Filled 2018-06-13: qty 20

## 2018-06-13 MED ORDER — HYDROMORPHONE HCL 1 MG/ML IJ SOLN
INTRAMUSCULAR | Status: DC | PRN
  Administered 2018-06-13 (×2): 1 mg via INTRAVENOUS

## 2018-06-13 MED ORDER — MIDAZOLAM HCL 2 MG/2ML IJ SOLN
INTRAMUSCULAR | Status: AC
  Filled 2018-06-13: qty 2

## 2018-06-13 MED ORDER — MORPHINE SULFATE (PF) 4 MG/ML IV SOLN
1.0000 mg | INTRAVENOUS | Status: DC | PRN
  Administered 2018-06-13 (×2): 2 mg via INTRAVENOUS
  Administered 2018-06-13: 1 mg via INTRAVENOUS

## 2018-06-13 MED ORDER — DEXAMETHASONE SODIUM PHOSPHATE 4 MG/ML IJ SOLN
INTRAMUSCULAR | Status: AC
  Filled 2018-06-13: qty 1

## 2018-06-13 MED ORDER — CEFAZOLIN SODIUM-DEXTROSE 2-3 GM-%(50ML) IV SOLR
INTRAVENOUS | Status: DC | PRN
  Administered 2018-06-13: 2 g via INTRAVENOUS

## 2018-06-13 MED ORDER — LACTATED RINGERS IV SOLN
INTRAVENOUS | Status: DC | PRN
  Administered 2018-06-13 (×2): via INTRAVENOUS

## 2018-06-13 MED ORDER — SODIUM CHLORIDE 0.9 % IR SOLN
Status: DC | PRN
  Administered 2018-06-13: 1

## 2018-06-13 MED ORDER — PROMETHAZINE HCL 25 MG/ML IJ SOLN
INTRAMUSCULAR | Status: AC
  Filled 2018-06-13: qty 1

## 2018-06-13 MED ORDER — TRANEXAMIC ACID 1000 MG/10ML IV SOLN
INTRAVENOUS | Status: DC | PRN
  Administered 2018-06-13: 1000 mg via INTRAVENOUS

## 2018-06-13 MED ORDER — PHENYLEPHRINE 8 MG IN D5W 100 ML (0.08MG/ML) PREMIX OPTIME
INJECTION | INTRAVENOUS | Status: AC
  Filled 2018-06-13: qty 100

## 2018-06-13 MED ORDER — LIDOCAINE HCL (PF) 1 % IJ SOLN
INTRAMUSCULAR | Status: AC
  Filled 2018-06-13: qty 5

## 2018-06-13 MED ORDER — PROMETHAZINE HCL 25 MG/ML IJ SOLN
6.2500 mg | INTRAMUSCULAR | Status: DC | PRN
  Administered 2018-06-13: 6.25 mg via INTRAVENOUS

## 2018-06-13 MED ORDER — PHENYLEPHRINE HCL 10 MG/ML IJ SOLN
INTRAMUSCULAR | Status: DC | PRN
  Administered 2018-06-13: 80 ug via INTRAVENOUS
  Administered 2018-06-13: 120 ug via INTRAVENOUS
  Administered 2018-06-13: 100 ug via INTRAVENOUS
  Administered 2018-06-13: 80 ug via INTRAVENOUS
  Administered 2018-06-13: 40 ug via INTRAVENOUS
  Administered 2018-06-13: 80 ug via INTRAVENOUS
  Administered 2018-06-13: 100 ug via INTRAVENOUS
  Administered 2018-06-13: 80 ug via INTRAVENOUS
  Administered 2018-06-13: 40 ug via INTRAVENOUS
  Administered 2018-06-13: 80 ug via INTRAVENOUS
  Administered 2018-06-13 (×2): 100 ug via INTRAVENOUS
  Administered 2018-06-13: 40 ug via INTRAVENOUS
  Administered 2018-06-13 (×2): 80 ug via INTRAVENOUS

## 2018-06-13 MED ORDER — FENTANYL CITRATE (PF) 100 MCG/2ML IJ SOLN
INTRAMUSCULAR | Status: AC
  Filled 2018-06-13: qty 2

## 2018-06-13 MED ORDER — SODIUM CHLORIDE 0.9 % IJ SOLN
INTRAMUSCULAR | Status: AC
  Filled 2018-06-13: qty 20

## 2018-06-13 MED ORDER — TRANEXAMIC ACID 1000 MG/10ML IV SOLN: INTRAVENOUS | Status: DC | PRN

## 2018-06-13 MED ORDER — ROCURONIUM BROMIDE 100 MG/10ML IV SOLN
INTRAVENOUS | Status: DC | PRN
  Administered 2018-06-13 (×2): 20 mg via INTRAVENOUS

## 2018-06-13 MED ORDER — OXYTOCIN 10 UNIT/ML IJ SOLN
INTRAVENOUS | Status: DC | PRN
  Administered 2018-06-13 (×2): 40 [IU] via INTRAVENOUS

## 2018-06-13 MED ORDER — SODIUM CHLORIDE 0.9 % IV SOLN
Freq: Once | INTRAVENOUS | Status: AC
  Administered 2018-06-13: 19:00:00 via INTRAVENOUS

## 2018-06-13 MED ORDER — MORPHINE SULFATE (PF) 4 MG/ML IV SOLN
INTRAVENOUS | Status: AC
  Filled 2018-06-13: qty 1

## 2018-06-13 MED ORDER — MEPERIDINE HCL 25 MG/ML IJ SOLN: 6.2500 mg | INTRAMUSCULAR | Status: DC | PRN

## 2018-06-13 SURGICAL SUPPLY — 49 items
BENZOIN TINCTURE PRP APPL 2/3 (GAUZE/BANDAGES/DRESSINGS) ×3 IMPLANT
CANISTER SUCT 3000ML PPV (MISCELLANEOUS) ×3 IMPLANT
CHLORAPREP W/TINT 26ML (MISCELLANEOUS) ×3 IMPLANT
CLOSURE WOUND 1/2 X4 (GAUZE/BANDAGES/DRESSINGS) ×1
DRSG OPSITE POSTOP 4X10 (GAUZE/BANDAGES/DRESSINGS) ×3 IMPLANT
DRSG OPSITE POSTOP 4X12 (GAUZE/BANDAGES/DRESSINGS) ×3 IMPLANT
DRSG TEGADERM 2.38X2.75 (GAUZE/BANDAGES/DRESSINGS) ×6 IMPLANT
DRSG TELFA 3X8 NADH (GAUZE/BANDAGES/DRESSINGS) ×6 IMPLANT
ELECT REM PT RETURN 9FT ADLT (ELECTROSURGICAL) ×3
ELECTRODE REM PT RTRN 9FT ADLT (ELECTROSURGICAL) ×1 IMPLANT
EXTRACTOR VACUUM KIWI (MISCELLANEOUS) ×3 IMPLANT
GLOVE BIOGEL PI IND STRL 7.0 (GLOVE) ×2 IMPLANT
GLOVE BIOGEL PI IND STRL 7.5 (GLOVE) ×1 IMPLANT
GLOVE BIOGEL PI INDICATOR 7.0 (GLOVE) ×4
GLOVE BIOGEL PI INDICATOR 7.5 (GLOVE) ×2
GLOVE SKINSENSE NS SZ7.0 (GLOVE) ×2
GLOVE SKINSENSE STRL SZ7.0 (GLOVE) ×1 IMPLANT
GOWN STRL REUS W/ TWL LRG LVL3 (GOWN DISPOSABLE) ×2 IMPLANT
GOWN STRL REUS W/ TWL XL LVL3 (GOWN DISPOSABLE) ×1 IMPLANT
GOWN STRL REUS W/TWL LRG LVL3 (GOWN DISPOSABLE) ×4
GOWN STRL REUS W/TWL XL LVL3 (GOWN DISPOSABLE) ×2
NEEDLE HYPO 22GX1.5 SAFETY (NEEDLE) ×3 IMPLANT
NEEDLE HYPO 25X5/8 SAFETYGLIDE (NEEDLE) ×6 IMPLANT
NS IRRIG 1000ML POUR BTL (IV SOLUTION) ×3 IMPLANT
PACK C SECTION WH (CUSTOM PROCEDURE TRAY) ×3 IMPLANT
PAD ABD 7.5X8 STRL (GAUZE/BANDAGES/DRESSINGS) ×3 IMPLANT
PAD OB MATERNITY 4.3X12.25 (PERSONAL CARE ITEMS) ×3 IMPLANT
PAD PREP 24X48 CUFFED NSTRL (MISCELLANEOUS) ×3 IMPLANT
PENCIL SMOKE EVAC W/HOLSTER (ELECTROSURGICAL) ×6 IMPLANT
SPONGE GAUZE 4X4 12PLY STER LF (GAUZE/BANDAGES/DRESSINGS) ×6 IMPLANT
SPONGE LAP 18X18 X RAY DECT (DISPOSABLE) ×9 IMPLANT
STAPLER VISISTAT 35W (STAPLE) ×3 IMPLANT
STRIP CLOSURE SKIN 1/2X4 (GAUZE/BANDAGES/DRESSINGS) ×2 IMPLANT
SUT CHROMIC 1 CTX 36 (SUTURE) ×9 IMPLANT
SUT CHROMIC 2 0 CT 1 (SUTURE) ×3 IMPLANT
SUT MNCRL 0 VIOLET CTX 36 (SUTURE) ×2 IMPLANT
SUT MON AB 2-0 CT1 36 (SUTURE) ×9 IMPLANT
SUT MON AB 4-0 PS1 27 (SUTURE) ×6 IMPLANT
SUT MONOCRYL 0 CTX 36 (SUTURE) ×4
SUT PLAIN 2 0 XLH (SUTURE) ×6 IMPLANT
SUT VIC AB 0 CT1 27 (SUTURE) ×10
SUT VIC AB 0 CT1 27XBRD ANBCTR (SUTURE) ×5 IMPLANT
SUT VIC AB 0 CT1 36 (SUTURE) ×9 IMPLANT
SUT VIC AB 1 CTX 36 (SUTURE) ×4
SUT VIC AB 1 CTX36XBRD ANBCTRL (SUTURE) ×2 IMPLANT
SUT VIC AB 3-0 CT1 27 (SUTURE) ×10
SUT VIC AB 3-0 CT1 TAPERPNT 27 (SUTURE) ×5 IMPLANT
SYR 3ML 23GX1 SAFETY (SYRINGE) ×6 IMPLANT
TOWEL OR 17X24 6PK STRL BLUE (TOWEL DISPOSABLE) ×9 IMPLANT

## 2018-06-13 NOTE — Transfer of Care (Signed)
Immediate Anesthesia Transfer of Care Note  Patient: Maria Riley  Procedure(s) Performed: CESAREAN SECTION (N/A )  Patient Location: PACU  Anesthesia Type:General  Level of Consciousness: awake, alert  and oriented  Airway & Oxygen Therapy: Patient Spontanous Breathing and Patient connected to nasal cannula oxygen  Post-op Assessment: Report given to RN and Post -op Vital signs reviewed and stable  Post vital signs: Reviewed and stable  Last Vitals:  Vitals Value Taken Time  BP    Temp    Pulse 103 06/13/2018  8:49 PM  Resp 14 06/13/2018  8:49 PM  SpO2 100 % 06/13/2018  8:49 PM  Vitals shown include unvalidated device data.  Last Pain: There were no vitals filed for this visit.       Complications: No apparent anesthesia complications

## 2018-06-13 NOTE — MAU Note (Signed)
Pt was brought back from lobby via wheelchair, transferred to bed. Provider at bedside Blanche East(J. Rasch, NP), RN unable to get FHR. Provider did bedside U/S confirming bradycardia. Dr. Tarri GlennPicken's was called to bedside and called stat c-section. NP, MD, and RN transported patient to OR at [id number].

## 2018-06-13 NOTE — Progress Notes (Signed)
PACU Note   Current Vital Signs 24h Vital Sign Ranges  T 99.1 F (37.3 C) Temp  Avg: 98.3 F (36.8 C)  Min: 97.4 F (36.3 C)  Max: 99.1 F (37.3 C)  BP 114/72 BP  Min: 87/50  Max: 114/72  HR (!) 102 Pulse  Avg: 103.2  Min: 93  Max: 122  RR 18 Resp  Avg: 15.4  Min: 10  Max: 22  SaO2 98 % Room Air SpO2  Avg: 99.4 %  Min: 98 %  Max: 100 %       24 Hour I/O Current Shift I/O  Time Ins Outs No intake/output data recorded. 07/08 1901 - 07/09 0700 In: 4459 [I.V.:2800] Out: 3327 [Urine:200]    Patient Vitals for the past 24 hrs:  BP Temp Temp src Pulse Resp SpO2  06/13/18 2235 - 99.1 F (37.3 C) Axillary - - -  06/13/18 2230 114/72 - - (!) 102 18 98 %  06/13/18 2215 102/73 - - 95 16 98 %  06/13/18 2200 105/72 - - 98 14 98 %  06/13/18 2153 - - - 100 16 98 %  06/13/18 2147 - - - (!) 122 20 98 %  06/13/18 2145 (!) 106/59 - - (!) 117 (!) 22 99 %  06/13/18 2140 - 98 F (36.7 C) Axillary (!) 110 20 100 %  06/13/18 2130 102/68 - - (!) 107 13 100 %  06/13/18 2125 - - - (!) 111 17 100 %  06/13/18 2115 93/74 98.5 F (36.9 C) - (!) 109 10 100 %  06/13/18 2100 93/62 - - 93 13 100 %  06/13/18 2054 - - - 96 12 100 %  06/13/18 2053 - - - 93 12 100 %  06/13/18 2052 - - - 97 16 100 %  06/13/18 2051 (!) 87/50 - - 98 13 100 %  06/13/18 2050 (!) 91/31 (!) 97.4 F (36.3 C) - (!) 103 14 100 %  06/13/18 1754 - - - - - 100 %  06/13/18 1749 - - - - - 100 %   UOP: 75-15200mL hr  Patient has had about 150 of clot with fundal massage while in the PACU and about another 100-15650mL just now. No active bleeding. She's received both the 2 units of PRBCs and 2 units of FFP and 1 unit of cryo with another one about to be given along with a unit of platelets. Will keep patient in pacu for now until all blood products are in and reassess about moving to the ward. Will recheck labs at around 0200.   Husband at bedside and updated status with interpreter.   Cornelia Copaharlie Johnell Bas, Jr MD Attending Center for [facility] (Faculty Practice) 06/13/2018 Time: 2300

## 2018-06-13 NOTE — Anesthesia Procedure Notes (Signed)
Procedure Name: Intubation Date/Time: 06/13/2018 5:58 PM Performed by: Elgie CongoMalinova, Jerek Meulemans H, CRNA Pre-anesthesia Checklist: Patient identified, Emergency Drugs available, Suction available and Patient being monitored Patient Re-evaluated:Patient Re-evaluated prior to induction Oxygen Delivery Method: Circle system utilized Preoxygenation: Pre-oxygenation with 100% oxygen Induction Type: IV induction, Cricoid Pressure applied and Rapid sequence Laryngoscope Size: Glidescope Grade View: Grade I Tube type: Oral Tube size: 7.0 mm Number of attempts: 1 Airway Equipment and Method: Rigid stylet Placement Confirmation: ETT inserted through vocal cords under direct vision,  positive ETCO2 and breath sounds checked- equal and bilateral Secured at: 20 cm Tube secured with: Tape Dental Injury: Teeth and Oropharynx as per pre-operative assessment

## 2018-06-13 NOTE — OB Triage Provider Note (Signed)
Ms.Nzinga D Cloyde ReamsBriceno is a 27 y.o. female [id number] @ 8134w0d here in MAU with complaints of abdominal pain that started this morning. Says 1 hour prior to her arrival the pain became severe and she started having bright red vaginal bleeding.   The patient was brought back to a bed via wheel chair. NP at bedside Bright red blood noted on perineum. Cervix checked 5 cm, 80%, presenting part indeterminate.  Unable to doppler fetal heart tones. IV started. Bedside US shows bradycardia unable to count/calculate bpm. Dr. Vergie LivingPickens called to bedside. STAT Cesarean called by Dr. Vergie LivingPickens.   Duane Lopeasch, Meleena Munroe I, NP 06/13/2018 6:26 PM

## 2018-06-13 NOTE — Anesthesia Preprocedure Evaluation (Signed)
Anesthesia Evaluation  Patient identified by MRN, date of birth, ID band Patient awake    Reviewed: Allergy & PrecautionsPreop documentation limited or incomplete due to emergent nature of procedure.  History of Anesthesia Complications Negative for: history of anesthetic complications  Airway Mallampati: II  TM Distance: >3 FB Neck ROM: Full    Dental  (+) Dental Advisory Given   Pulmonary neg pulmonary ROS,    breath sounds clear to auscultation       Cardiovascular negative cardio ROS   Rhythm:Regular Rate:Normal     Neuro/Psych    GI/Hepatic negative GI ROS,   Endo/Other  negative endocrine ROS  Renal/GU      Musculoskeletal   Abdominal   Peds  Hematology   Anesthesia Other Findings   Reproductive/Obstetrics (+) Pregnancy Baby's heart rate low, presumed abruption and emergently for C-section                             Anesthesia Physical Anesthesia Plan  ASA: II and emergent  Anesthesia Plan: General   Post-op Pain Management:    Induction: Intravenous and Rapid sequence  PONV Risk Score and Plan: 3 and Ondansetron, Dexamethasone and Treatment may vary due to age or medical condition  Airway Management Planned: Oral ETT and Video Laryngoscope Planned  Additional Equipment:   Intra-op Plan:   Post-operative Plan: Extubation in OR  Informed Consent: I have reviewed the patients History and Physical, chart, labs and discussed the procedure including the risks, benefits and alternatives for the proposed anesthesia with the patient or authorized representative who has indicated his/her understanding and acceptance.   Dental advisory given and Only emergency history available  Plan Discussed with: CRNA and Surgeon  Anesthesia Plan Comments: (Emergent GETA)        Anesthesia Quick Evaluation

## 2018-06-13 NOTE — MAU Note (Addendum)
Pickens to bedside, stat c/s called.  House coverage and OR coordinator already made aware of possiblity notified. HC on unit. Or team member present on unit also

## 2018-06-13 NOTE — MAU Note (Signed)
Pepcid given to OR staff.

## 2018-06-13 NOTE — Op Note (Signed)
Operative Note   SURGERY DATE: 06/13/2018  PRE-OP DIAGNOSIS:  *Pregnancy at 35/0 *Vaginal bleeding *Severe abdominal pain *Fetal bradycardia  POST-OP DIAGNOSIS: Same. Delivered. Placental abruption. Postpartum hemorrhage. DIC.   PROCEDURE: Stat primary low transverse cesarean section via Claretha CooperJoel Cohen Method with double layer uterine closure  SURGEON: Surgeon(s) and Role:    [place] Bing* Samika Vetsch, MD - Primary  ASSISTANT: None  ANESTHESIA: general  ESTIMATED BLOOD LOSS: 3425mL  DRAINS: 200mL UOP via indwelling foley  TOTAL IV FLUIDS: 4200mL LR, 1200mL NS, 500mL albumin. PRBCs were started in the OR and blood products continued in the PACU  VTE PROPHYLAXIS: SCDs to bilateral lower extremities  ANTIBIOTICS: Intraoperative ancef 2gm. Azithromycin 500mg  IV x 1 in the PACU  SPECIMENS: placenta  COMPLICATIONS: DIC  FINDINGS: No intra-abdominal adhesions were noted. Grossly normal uterus, tubes and ovaries. On entry into the uterus, there was approximately 250mL of old blood and clot that was extruding the through the hysterotomy. On rupture of the membranes, there was blood stained amniotic fluid, fetus in cephalic presentation female infant, weight 2050gm, APGARs 0/0/0, intact placenta. Results for Maria Riley, Maria Riley (MRN [id number]) as of 06/13/2018 21:09  Ref. Range 06/13/2018 18:46 06/13/2018 18:47  pH cord blood (arterial) Latest Ref Range: 7.210 - 7.380  6.881 (LL) CRITICAL RESULT C...  pCO2 cord blood (arterial) Latest Ref Range: 42.0 - 56.0 mmHg 113.0 (H) 108.0 (H)  Bicarbonate Latest Ref Range: 13.0 - 22.0 mmol/L 20.1    PROCEDURE IN DETAIL: I was called to the [facility] for inability to obtain fetal heart tones and fetal bradycardia seen on bedside ultrasound. I immediately came to the patient's room, where the bedside ultrasound was showing the fetal heart and no cardiac movement seen. I took the probe and obtained the same findings. I immediately called for a stat  cesarean section. The patient was taken to the operating room from the [facility] where anesthesia and the OR staff was waiting. She was moved over to the OR table, foley catheter placed and her belly prepped with betadine, and still no fetal heart tones were heard during this process. She was then draped in the normal fashion in the dorsal supine position. After general anesthesia was adequate, a low transverse skin incision was made through to the fascia and the fascia incised with the scalpel and bluntly opened. Bladder blade and large rich were inserted and the hysterotomy was made and then the amniotic sac. The fetus was easily delivered, cord immediately clamped long and cut and handed to the awaiting NICU team.  The placenta along with large amounts of clot were then easily expressed from the uterus and then the uterus was exteriorized and cleared of all clots and debris. The hysterotomy was repaired with a running suture of 1-0 monocryl. A second imbricating layer of 1-0 vicryl suture was then placed. Several figure-of-eight sutures of 1-0 vicryl, monocryl and #1 and #2 chromic were then placed. The uterus was hemostatic as well as surrounding tissue after irrigation but would vaciliate between hemostasis and general oozing. During this time, I ordered fibrinogen and coags because of the oozing the blood didn't seem to clot that was running out. The uterus was firm and no evidence of atony. The uterus was re-exteriorized twice to check for any bleeding and several hysterotomy figure eight sutures were placed and several O'Leary stitches on the right and left  hysterotomy were also placed.   The uterus and adnexa were then returned to the abdomen, and  the hysterotomy and all operative sites were reinspected and excellent hemostasis was noted after irrigation and suction of the abdomen with warm saline.  The fascia was reapproximated with 0 Vicryl in a simple running fashion. The  subcutaneous layer was then reapproximated with interrupted sutures of 2-0 plain gut, and the skin was then closed with staples.  The patient  tolerated the procedure well. Sponge, lap, needle, and instrument counts were correct x 2. The patient was transferred to the recovery room awake, alert and breathing independently in stable condition.  Cornelia Copa MD Attending Center for [facility])

## 2018-06-13 NOTE — Progress Notes (Addendum)
PACU Note  Labs are: CBC Latest Ref Rng & Units 06/13/2018 06/13/2018 06/13/2018  WBC 4.0 - 10.5 K/uL 12.5(H) - 10.7(H)  Hemoglobin 12.0 - 15.0 g/dL 1.6(X9.8(L) 6.5(LL) 12.4  Hematocrit 36.0 - 46.0 % 27.0(L) 19.0(L) 36.2  Platelets 150 - 400 K/uL 62(L) - 101(L)   Results for Maria Riley, Maria Riley (MRN [id number]) as of 06/13/2018 21:21  Ref. Range 06/13/2018 19:25  [name], [name]: 0.00 - 0.50 ug/mL-FEU >20.00 (H)  Fibrinogen Latest Ref Range: 210 - 475 mg/dL <81<60 (LL)   Good urine output and vital stables stable. She had some clots and bleeding with initial fundal massage in the OR but that stopped and firm fundus below the umbilicus and lochia minimal.    Current Vital Signs 24h Vital Sign Ranges  T 98.5 F (36.9 C) Temp  Avg: 98 F (36.7 C)  Min: 97.4 F (36.3 C)  Max: 98.5 F (36.9 C)  BP 93/74 BP  Min: 87/50  Max: 93/74  HR (!) 109 Pulse  Avg: 98.4  Min: 93  Max: 109  RR 10 Resp  Avg: 12.9  Min: 10  Max: 16  SaO2 100 % Nasal Cannula SpO2  Avg: 100 %  Min: 100 %  Max: 100 %       24 Hour I/O Current Shift I/O  Time Ins Outs No intake/output data recorded. 07/08 1901 - 07/09 0700 In: 3800 [I.V.:2800] Out: 3125 [Urine:200]    Patient Vitals for the past 24 hrs:  BP Temp Pulse Resp SpO2  06/13/18 2115 93/74 98.5 F (36.9 C) (!) 109 10 100 %  06/13/18 2100 93/62 - 93 13 100 %  06/13/18 2054 - - 96 12 100 %  06/13/18 2053 - - 93 12 100 %  06/13/18 2052 - - 97 16 100 %  06/13/18 2051 (!) 87/50 - 98 13 100 %  06/13/18 2050 (!) 91/31 (!) 97.4 F (36.3 C) (!) 103 14 100 %  06/13/18 1754 - - - - 100 %  06/13/18 1749 - - - - 100 %   Patient has gotten two units of PRBCs and two units of FFP about to be administered and will order two of cryo. Will crossmatch two more units PRBCs and one more unit of FFP. Will order repeat cbc, coags, fibrinogen at 2330 and decide if more products are needed.  I talked to NICU about the newborn and the situation is not good. I called the  patient's husband with the interpreter and discussed the situation re: the patient the newborn. He is approximately 3 hours away.  Cornelia Copaharlie Latunya Kissick, Jr MD Attending Center for [facility] (Faculty Practice) 06/13/2018 Time: 2123

## 2018-06-14 ENCOUNTER — Other Ambulatory Visit: Payer: Self-pay

## 2018-06-14 ENCOUNTER — Encounter (HOSPITAL_COMMUNITY): Payer: Self-pay | Admitting: *Deleted

## 2018-06-14 LAB — BPAM CRYOPRECIPITATE
Blood Product Expiration Date: 201907090340
Blood Product Expiration Date: 201907090354
ISSUE DATE / TIME: 201907082151
ISSUE DATE / TIME: 201907082301
UNIT TYPE AND RH: 6200
Unit Type and Rh: 6200

## 2018-06-14 LAB — COMPREHENSIVE METABOLIC PANEL
ALBUMIN: 2.8 g/dL — AB (ref 3.5–5.0)
ALK PHOS: 47 U/L (ref 38–126)
ALT: 18 U/L (ref 0–44)
AST: 46 U/L — ABNORMAL HIGH (ref 15–41)
Anion gap: 9 (ref 5–15)
BILIRUBIN TOTAL: 0.7 mg/dL (ref 0.3–1.2)
BUN: 19 mg/dL (ref 6–20)
CALCIUM: 8.1 mg/dL — AB (ref 8.9–10.3)
CO2: 22 mmol/L (ref 22–32)
Chloride: 104 mmol/L (ref 98–111)
Creatinine, Ser: 0.66 mg/dL (ref 0.44–1.00)
GFR calc Af Amer: >60 mL/min (ref >60)
GLUCOSE: 107 mg/dL — AB (ref 70–99)
Potassium: 3.5 mmol/L (ref 3.5–5.1)
Sodium: 135 mmol/L (ref 135–145)
TOTAL PROTEIN: 5.5 g/dL — AB (ref 6.5–8.1)

## 2018-06-14 LAB — CBC
HCT: 24.4 % — ABNORMAL LOW (ref 36.0–46.0)
HEMATOCRIT: 17.8 % — AB (ref 36.0–46.0)
HEMATOCRIT: 23.7 % — AB (ref 36.0–46.0)
HEMOGLOBIN: 6.5 g/dL — AB (ref 12.0–15.0)
HEMOGLOBIN: 8.9 g/dL — AB (ref 12.0–15.0)
HEMOGLOBIN: 8.5 g/dL — AB (ref 12.0–15.0)
MCH: 31 pg (ref 26.0–34.0)
MCH: 30.8 pg (ref 26.0–34.0)
MCH: 30.1 pg (ref 26.0–34.0)
MCHC: 36.5 g/dL — ABNORMAL HIGH (ref 30.0–36.0)
MCHC: 36.5 g/dL — ABNORMAL HIGH (ref 30.0–36.0)
MCHC: 35.9 g/dL (ref 30.0–36.0)
MCV: 84.8 fL (ref 78.0–100.0)
MCV: 84.4 fL (ref 78.0–100.0)
MCV: 84 fL (ref 78.0–100.0)
PLATELETS: 86 10*3/uL — AB (ref 150–400)
Platelets: 110 10*3/uL — ABNORMAL LOW (ref 150–400)
Platelets: 90 10*3/uL — ABNORMAL LOW (ref 150–400)
RBC: 2.1 MIL/uL — ABNORMAL LOW (ref 3.87–5.11)
RBC: 2.89 MIL/uL — AB (ref 3.87–5.11)
RBC: 2.82 MIL/uL — ABNORMAL LOW (ref 3.87–5.11)
RDW: 14.5 % (ref 11.5–15.5)
RDW: 14.5 % (ref 11.5–15.5)
RDW: 14.9 % (ref 11.5–15.5)
WBC: 14.2 10*3/uL — ABNORMAL HIGH (ref 4.0–10.5)
WBC: 11.7 10*3/uL — ABNORMAL HIGH (ref 4.0–10.5)
WBC: 10.5 10*3/uL (ref 4.0–10.5)

## 2018-06-14 LAB — PREPARE PLATELET PHERESIS: Unit division: 0

## 2018-06-14 LAB — BPAM FFP
BLOOD PRODUCT EXPIRATION DATE: 201907132359
BLOOD PRODUCT EXPIRATION DATE: 201907132359
BLOOD PRODUCT EXPIRATION DATE: 201907132359
ISSUE DATE / TIME: 201907082117
ISSUE DATE / TIME: 201907082151
UNIT TYPE AND RH: 6200
Unit Type and Rh: 600
Unit Type and Rh: 600

## 2018-06-14 LAB — PREPARE CRYOPRECIPITATE
UNIT DIVISION: 0
Unit division: 0

## 2018-06-14 LAB — PROTIME-INR
INR: 1.15
Prothrombin Time: 14.6 seconds (ref 11.4–15.2)

## 2018-06-14 LAB — PREPARE FRESH FROZEN PLASMA
Unit division: 0
Unit division: 0
Unit division: 0

## 2018-06-14 LAB — RPR: RPR Ser Ql: NONREACTIVE

## 2018-06-14 LAB — BPAM PLATELET PHERESIS
BLOOD PRODUCT EXPIRATION DATE: 201907092247
ISSUE DATE / TIME: 201907082303
UNIT TYPE AND RH: 6200

## 2018-06-14 LAB — FIBRINOGEN: FIBRINOGEN: 267 mg/dL (ref 210–475)

## 2018-06-14 LAB — APTT: APTT: 30 s (ref 24–36)

## 2018-06-14 MED ORDER — OXYTOCIN 40 UNITS IN LACTATED RINGERS INFUSION - SIMPLE MED: 2.5000 [IU]/h | INTRAVENOUS | Status: AC

## 2018-06-14 MED ORDER — DIBUCAINE 1 % RE OINT: 1.0000 "application " | TOPICAL_OINTMENT | RECTAL | Status: DC | PRN

## 2018-06-14 MED ORDER — TETANUS-DIPHTH-ACELL PERTUSSIS 5-2.5-18.5 LF-MCG/0.5 IM SUSP: 0.5000 mL | Freq: Once | INTRAMUSCULAR | Status: DC

## 2018-06-14 MED ORDER — COCONUT OIL OIL: 1.0000 "application " | TOPICAL_OIL | Status: DC | PRN

## 2018-06-14 MED ORDER — OXYCODONE-ACETAMINOPHEN 5-325 MG PO TABS
1.0000 | ORAL_TABLET | ORAL | Status: DC | PRN
  Administered 2018-06-14 – 2018-06-17 (×3): 1 via ORAL
  Filled 2018-06-14 (×5): qty 1

## 2018-06-14 MED ORDER — ACETAMINOPHEN 325 MG PO TABS
650.0000 mg | ORAL_TABLET | ORAL | Status: DC | PRN
  Administered 2018-06-14 (×2): 650 mg via ORAL
  Filled 2018-06-14 (×2): qty 2

## 2018-06-14 MED ORDER — ONDANSETRON HCL 4 MG/2ML IJ SOLN: 4.0000 mg | Freq: Four times a day (QID) | INTRAMUSCULAR | Status: DC | PRN

## 2018-06-14 MED ORDER — SODIUM CHLORIDE 0.9% FLUSH: 9.0000 mL | INTRAVENOUS | Status: DC | PRN

## 2018-06-14 MED ORDER — PRENATAL MULTIVITAMIN CH
1.0000 | ORAL_TABLET | Freq: Every day | ORAL | Status: DC
  Administered 2018-06-14 – 2018-06-17 (×4): 1 via ORAL
  Filled 2018-06-14 (×4): qty 1

## 2018-06-14 MED ORDER — NALOXONE HCL 0.4 MG/ML IJ SOLN: 0.4000 mg | INTRAMUSCULAR | Status: DC | PRN

## 2018-06-14 MED ORDER — WITCH HAZEL-GLYCERIN EX PADS: 1.0000 "application " | MEDICATED_PAD | CUTANEOUS | Status: DC | PRN

## 2018-06-14 MED ORDER — DIPHENHYDRAMINE HCL 25 MG PO CAPS
25.0000 mg | ORAL_CAPSULE | ORAL | Status: DC | PRN
  Administered 2018-06-14: 25 mg via ORAL
  Filled 2018-06-14: qty 1

## 2018-06-14 MED ORDER — HYDROMORPHONE HCL 1 MG/ML IJ SOLN
1.0000 mg | INTRAMUSCULAR | Status: DC | PRN
  Administered 2018-06-14 – 2018-06-15 (×5): 2 mg via INTRAVENOUS
  Filled 2018-06-14 (×5): qty 2

## 2018-06-14 MED ORDER — SIMETHICONE 80 MG PO CHEW
80.0000 mg | CHEWABLE_TABLET | Freq: Three times a day (TID) | ORAL | Status: DC
  Administered 2018-06-14 – 2018-06-17 (×8): 80 mg via ORAL
  Filled 2018-06-14 (×8): qty 1

## 2018-06-14 MED ORDER — MENTHOL 3 MG MT LOZG: 1.0000 | LOZENGE | OROMUCOSAL | Status: DC | PRN

## 2018-06-14 MED ORDER — OXYCODONE-ACETAMINOPHEN 5-325 MG PO TABS
2.0000 | ORAL_TABLET | ORAL | Status: DC | PRN
  Administered 2018-06-14 – 2018-06-17 (×10): 2 via ORAL
  Filled 2018-06-14 (×10): qty 2

## 2018-06-14 MED ORDER — LACTATED RINGERS IV SOLN
INTRAVENOUS | Status: DC
  Administered 2018-06-14 (×2): via INTRAVENOUS

## 2018-06-14 MED ORDER — SENNOSIDES-DOCUSATE SODIUM 8.6-50 MG PO TABS
2.0000 | ORAL_TABLET | Freq: Every evening | ORAL | Status: DC | PRN
  Administered 2018-06-15: 2 via ORAL
  Filled 2018-06-14: qty 2

## 2018-06-14 MED ORDER — FENTANYL 40 MCG/ML IV SOLN
INTRAVENOUS | Status: DC
  Administered 2018-06-14: 43.41 ug via INTRAVENOUS
  Administered 2018-06-14: 40 ug via INTRAVENOUS
  Administered 2018-06-14: 290 ug via INTRAVENOUS
  Administered 2018-06-14: 50 ug via INTRAVENOUS
  Administered 2018-06-14: 02:00:00 via INTRAVENOUS
  Administered 2018-06-14: 40 ug via INTRAVENOUS
  Filled 2018-06-14: qty 25
  Filled 2018-06-14: qty 1000

## 2018-06-14 NOTE — Lactation Note (Signed)
This note was copied from a baby's chart. Lactation Consultation Note  Patient Name: Maria Riley OZHYQ'MToday's Date: 06/14/2018 Reason for consult: Initial assessment;Late-preterm 34-36.6wks;NICU baby  Mom distressed right now, infant not doing well she reports.  Mom reports she doesn't feel like talking or being seen right now.  Left handouts.  Will follow up with RN tomorrow regarding infants condition Maternal Data    Feeding    LATCH Score                   Interventions    Lactation Tools Discussed/Used     Consult Status      Meagan Ancona Michaelle CopasS Simrat Kendrick 06/14/2018, 6:32 PM

## 2018-06-14 NOTE — Progress Notes (Addendum)
OB Note  CBC Latest Ref Rng & Units 06/14/2018 06/13/2018 06/13/2018  WBC 4.0 - 10.5 K/uL 14.2(H) 12.5(H) -  Hemoglobin 12.0 - 15.0 g/dL 6.5(LL) 9.8(L) 6.5(LL)  Hematocrit 36.0 - 46.0 % 17.8(L) 27.0(L) 19.0(L)  Platelets 150 - 400 K/uL 110(L) 62(L) -   PT/INR/PTT normal @ 1.15/14.6/30  Total products given thus far: 2U PRBCs, 2U FFP, 2U cryo, 1U platelts  Will write for another 2 units of PRBCs. CMP, fibrinogen pending. Bleeding minimal  Cornelia Copaharlie Rector Devonshire, Jr MD Attending Center for [facility] ([facility]) 06/14/2018 Time: [id number]    Fibrinogen 207.  CMP Latest Ref Rng & Units 06/14/2018 06/13/2018 10/23/2010  Glucose 70 - 99 mg/dL 960(A107(H) - 81  BUN 6 - 20 mg/dL 19 - 10  Creatinine 5.400.44 - 1.00 mg/dL 9.810.66 - 0.6  Sodium 191135 - 145 mmol/L 135 138 137  Potassium 3.5 - 5.1 mmol/L 3.5 4.1 4.2  Chloride 98 - 111 mmol/L 104 - 107  CO2 22 - 32 mmol/L 22 - -  Calcium 8.9 - 10.3 mg/dL 8.1(L) - -  Total Protein 6.5 - 8.1 g/dL 4.7(W5.5(L) - -  Total Bilirubin 0.3 - 1.2 mg/dL 0.7 - -  Alkaline Phos 38 - 126 U/L 47 - -  AST 15 - 41 U/L 46(H) - -  ALT 0 - 44 U/L 18 - -    Will not transfuse any more blood products  Cornelia Copaharlie Daylen Hack, Jr MD Attending Center for [facility]) 06/14/2018 Time: [phone]

## 2018-06-14 NOTE — Progress Notes (Addendum)
CRITICAL VALUE ALERT  Critical Value:  Hgb 6.5  Date & Time Notied: 06/14/18 @ 0258  Provider Notified: Dr. Vergie LivingPickens   Orders Received/Actions taken: Transfuse 2 units of RBCs.

## 2018-06-14 NOTE — Progress Notes (Signed)
Subjective: Postpartum Day 1: Cesarean Delivery, stat d/t placenta abruption Pt sitting up in bed eating. Reports pain controlled. Bleeding minium.    Objective: Vital signs in last 24 hours: Temp:  [97.4 F (36.3 C)-99.5 F (37.5 C)] 98.2 F (36.8 C) (07/09 1222) Pulse Rate:  [81-122] 91 (07/09 1222) Resp:  [10-22] 18 (07/09 1222) BP: (86-114)/(31-77) 86/67 (07/09 1222) SpO2:  [96 %-100 %] 97 % (07/09 1222)  Physical Exam:  General: alert Lochia: appropriate Uterine Fundus: firm Incision: drsg intact DVT Evaluation: No evidence of DVT seen on physical exam.  Recent Labs    06/14/18 0239 06/14/18 0836  HGB 6.5* 8.9*  HCT 17.8* 24.4*    Assessment/Plan: POD # 1 Stat LTCS d/t placenta abruption DIC, resolved Anemia d/t to above  Stable. Coag studies normal now. S/P blood transfusion. Repeat labs later today. Continue with progressive care. Infant remains in NICU prognosis remains uncertain.  Interrupter services used for visit.  Hermina StaggersMichael L Ervin 06/14/2018, 1:10 PM

## 2018-06-14 NOTE — Anesthesia Postprocedure Evaluation (Signed)
Anesthesia Post Note  Patient: Blondell RevealLinda D Jock  Procedure(s) Performed: CESAREAN SECTION (N/A )     Patient location during evaluation: PACU Anesthesia Type: General Level of consciousness: awake and alert Pain management: pain level controlled Vital Signs Assessment: post-procedure vital signs reviewed and stable Respiratory status: spontaneous breathing, nonlabored ventilation, respiratory function stable and patient connected to nasal cannula oxygen Cardiovascular status: blood pressure returned to baseline and stable Postop Assessment: no apparent nausea or vomiting Anesthetic complications: no Comments: Patient condition much improved after administration of blood and blood products.    Last Vitals:  Vitals:   06/14/18 0000 06/14/18 0123  BP: 105/61 (!) 95/57  Pulse: (!) 109 (!) 103  Resp: 18 19  Temp:  36.4 C  SpO2: 99% 99%    Last Pain:  Vitals:   06/14/18 0123  TempSrc: Oral  PainSc: 8    Pain Goal:                 Catheryn Baconyan P Ellender

## 2018-06-14 NOTE — Progress Notes (Signed)
Fetnyl PCA discontinued. Wasted 12ml into sick. Witnessed by Donzetta Sprungebbie Warren RN.

## 2018-06-14 NOTE — Anesthesia Postprocedure Evaluation (Signed)
Anesthesia Post Note  Patient: Blondell RevealLinda D Mineer  Procedure(s) Performed: CESAREAN SECTION (N/A )     Patient location during evaluation: Women[facility] Unit Anesthesia Type: General Level of consciousness: awake, awake and alert and oriented Pain management: pain level controlled Vital Signs Assessment: post-procedure vital signs reviewed and stable Respiratory status: spontaneous breathing, nonlabored ventilation and respiratory function stable Cardiovascular status: stable Postop Assessment: no headache, no backache, no apparent nausea or vomiting, patient able to bend at knees, adequate PO intake and able to ambulate Anesthetic complications: no    Last Vitals:  Vitals:   06/14/18 0645 06/14/18 0847  BP: (!) 90/58 101/70  Pulse: 81 86  Resp: 19 16  Temp: 37.2 C 37.2 C  SpO2: 98% 98%    Last Pain:  Vitals:   06/14/18 0914  TempSrc:   PainSc: 7    Pain Goal: Patients Stated Pain Goal: 3 (06/14/18 0914)               Shammara Jarrett

## 2018-06-14 NOTE — Addendum Note (Signed)
Addendum  created 06/14/18 1011 by Zaelyn Barbary, Doree Fudgeolleen S, CRNA   Sign clinical note

## 2018-06-15 LAB — BPAM RBC
BLOOD PRODUCT EXPIRATION DATE: 201907212359
Blood Product Expiration Date: 201907202359
Blood Product Expiration Date: 201907202359
Blood Product Expiration Date: 201907202359
ISSUE DATE / TIME: 201907081918
ISSUE DATE / TIME: 201907090317
ISSUE DATE / TIME: 201907081918
ISSUE DATE / TIME: 201907090517
UNIT TYPE AND RH: 600
UNIT TYPE AND RH: 600
Unit Type and Rh: 600
Unit Type and Rh: 600

## 2018-06-15 LAB — TYPE AND SCREEN
ABO/RH(D): A POS
ANTIBODY SCREEN: NEGATIVE
UNIT DIVISION: 0
UNIT DIVISION: 0
Unit division: 0
Unit division: 0

## 2018-06-15 NOTE — Lactation Note (Addendum)
This note was copied from a baby's chart. Lactation Consultation Note  Patient Name: Maria Riley ZOXWR'UToday's Date: 06/15/2018   Kennyth LosePacifica Interpreter used for [place]. P4, Baby 48 hours old in NICU critically ill.  35 weeks.  Room full of visitors. Mother states she knows how to hand express.  Encouraged hand expression after pumping. Mother has been pumping approx 5 ml.  Mother has labels and denies problems or concerns with pumping. Encouraged pumping q 2.5- 3 hours. Provided mother w/ hand pump. [facility] Discussed pumping rooms on 2nd floor and reminded mother to take pump parts with her.   Due to situation consult was kept brief as mother was asking to go to NICU to visit baby "[name]".      Maternal Data    Feeding    LATCH Score                   Interventions    Lactation Tools Discussed/Used     Consult Status      Hardie PulleyBerkelhammer, Ruth Boschen 06/15/2018, 6:39 PM

## 2018-06-15 NOTE — Progress Notes (Signed)
Subjective: Postpartum Day 2: Cesarean Delivery, stat d/t placenta abruption Pt reports no complaints today. Except sore. Pain medication helps. Tolerating diet, ambulating and voiding without problems. Breast pumping.   Objective: Vital signs in last 24 hours: Temp:  [97.9 F (36.6 C)-100.2 F (37.9 C)] 99.2 F (37.3 C) (07/10 0823) Pulse Rate:  [89-101] 89 (07/10 0823) Resp:  [16-18] 18 (07/10 0823) BP: (86-109)/(62-71) 109/71 (07/10 0823) SpO2:  [97 %-100 %] 100 % (07/10 0823) Weight:  [65.8 kg (145 lb)] 65.8 kg (145 lb) (07/09 2054)  Physical Exam:  General: alert Lochia: appropriate Uterine Fundus: firm Incision: drsg intact DVT Evaluation: No evidence of DVT seen on physical exam.  Recent Labs    06/14/18 0836 06/14/18 1457  HGB 8.9* 8.5*  HCT 24.4* 23.7*    Assessment/Plan: POD # 2 Stat LTCS d/t placenta abruption DIC secondary to above, resolved Anemia d/t to above Stable. Infant remains in NICU prognosis remains uncertain. Continue with progressive  care  Hermina StaggersMichael L Mileydi Milsap 06/15/2018, 9:43 AM

## 2018-06-15 NOTE — Progress Notes (Signed)
This is a late entry note for a visit that took place on 7/9.  I offered support to family in pt's room as well as at baby's  bedside.  The family is grieving and remaining hopeful at the same time until they learn more after [name] is warmed up.  I offered pastoral presence over several visits on 7/9 and 7/10.  Chaplain Dyanne CarrelKaty Esther Bradstreet, Bcc Pager, [phone] 4:51 PM

## 2018-06-16 ENCOUNTER — Encounter: Payer: Self-pay | Admitting: Obstetrics

## 2018-06-16 ENCOUNTER — Encounter: Payer: Self-pay | Admitting: Obstetrics and Gynecology

## 2018-06-16 NOTE — Progress Notes (Addendum)
Follow up visit with Maria Riley and family.  Chaplain Claussen spent considerable time with the family over the last couple of days.  When I visited, Maria Riley was pumping and had several visitors in the room.  She shared that [name] was doing about the same, but did not say more much beyond that.  I reminded her that we are available for continued support.  Please page as further needs arise.  Maryanna ShapeAmanda M. Carley Hammedavee Lomax, M.Div. [facility] Chaplain Pager [phone] Office [phone]      06/16/18 1450  Clinical Encounter Type  Visited With Patient and family together  Visit Type Follow-up  Referral From Chaplain;Nurse

## 2018-06-16 NOTE — Lactation Note (Signed)
This note was copied from a baby's chart. Lactation Consultation Note  Patient Name: Maria Riley AVWUJ'WToday's Date: 06/16/2018   Visited with P4 Mom today.  Baby in NICU in critical condition.  Baby 68 hrs old.  Mom using hand pump, and expressing milk.  Both breasts are engorged.  Mom not wanting to use a double pump at present.    After she expressed 45 ml, bilateral ice packs applied to breasts.  Encouraged Mom to lie in bed to help with engorgement.  Mom said she prefers to remain in upright chair.    Set up DEBP.  Mom to double pump for 20 mins after icing.  Mom started to cry.  Spoke to her with care and compassion.  Mom had her EBM in small colostrum vials.  Labels applied.  Mom desires to give baby her colostrum by swab.  Will take her milk to her baby after she is finished icing and double pumping.    [facility]. To follow-up prn and in am.  Judee ClaraSmith, Juanjesus Pepperman E 06/16/2018, 2:31 PM

## 2018-06-16 NOTE — Progress Notes (Signed)
Subjective: Postpartum Day 3: Cesarean Delivery, stat d/t placental abruption  Pt sitting up in chair. Tolerating diet, pain controlled, ambulating and voiding without problems. Has noted some increase lochia with ambulation.   Objective: Vital signs in last 24 hours: Temp:  [98.3 F (36.8 C)-99.6 F (37.6 C)] 99.2 F (37.3 C) (07/11 0801) Pulse Rate:  [88-119] 93 (07/11 0801) Resp:  [16-18] 17 (07/11 0801) BP: (98-116)/(66-82) 100/71 (07/11 0801) SpO2:  [98 %-99 %] 99 % (07/11 0801)  Physical Exam:  General: alert Lochia: appropriate but increased with ambulation Uterine Fundus: firm Incision: honeycomb in place DVT Evaluation: No evidence of DVT seen on physical exam.  Recent Labs    06/14/18 0836 06/14/18 1457  HGB 8.9* 8.5*  HCT 24.4* 23.7*    Assessment/Plan: POD # 2 Stat LTCS d/t placenta abruption DIC secondary to above resolved Anemia d/t above  Stable. Will continue to monitor overnight d/y increased lochia. Will check CBC. Infants prognosis remains poor. Hermina StaggersMichael L Ervin 06/16/2018, 2:41 PM

## 2018-06-16 NOTE — Progress Notes (Signed)
Chaplain was bedside to support family during [employer] and assist Father who administered the ceremony for [name]. Family members took turns visiting baby following the ceremony. All were sad and appropriately tearful. At a couple points [name]'s parents were sobbing, but appropriately. They were appreciative of visit and support. I returned at 7:50 to offer additional support. Mom said she was ok and was about to return to NICU. Please page if additional support is needed. Chaplain Marjory Liesamela Loyd Marhefka Holder, [place]   06/16/18 2000  Clinical Encounter Type  Visited With Patient and family together

## 2018-06-16 NOTE — Plan of Care (Signed)
Pt. Condition will continue to improve 

## 2018-06-17 LAB — CBC
HCT: 28.8 % — ABNORMAL LOW (ref 36.0–46.0)
Hemoglobin: 10.1 g/dL — ABNORMAL LOW (ref 12.0–15.0)
MCH: 30.9 pg (ref 26.0–34.0)
MCHC: 35.1 g/dL (ref 30.0–36.0)
MCV: 88.1 fL (ref 78.0–100.0)
PLATELETS: 164 10*3/uL (ref 150–400)
RBC: 3.27 MIL/uL — ABNORMAL LOW (ref 3.87–5.11)
RDW: 15.1 % (ref 11.5–15.5)
WBC: 8 10*3/uL (ref 4.0–10.5)

## 2018-06-17 MED ORDER — OXYCODONE-ACETAMINOPHEN 5-325 MG PO TABS: 1.0000 | ORAL_TABLET | ORAL | 0 refills | Status: DC | PRN

## 2018-06-17 MED ORDER — IBUPROFEN 800 MG PO TABS: 800.0000 mg | ORAL_TABLET | Freq: Three times a day (TID) | ORAL | 1 refills | Status: DC | PRN

## 2018-06-17 NOTE — Discharge Summary (Signed)
OB Discharge Summary     Patient Name: Maria Riley DOB: 01/02/1991 MRN: [id number]  Date of admission: 06/13/2018 Delivering MD: Kensett BingPICKENS, CHARLIE   Date of discharge: 06/17/2018  Admitting diagnosis: 35 WKS, CTX and placenta abruption Intrauterine pregnancy: 3375w0d     Secondary diagnosis:  Active Problems:   DIC (disseminated intravascular coagulation) (HCC)   Antepartum placental abruption   PPH (postpartum hemorrhage)   Language barrier  Additional problems: none     Discharge diagnosis: S/P stat c section d/t placenta abruption                                                                                                Post partum procedures:blood transfusion  Augmentation: NA  Complications: Placental Abruption  Hospital course: Maria Riley presented with placental abruption and was taken to the OR for a STAT c section. See delivery note for additional information. Surgery was complicated by DIC. Pt received multipe blood productions with resolution of her DIC and not further complications of her DIC.  Infant however had 0/0/0 Apgars and was admitted to NICU. Prognosis remained poor at time of mother's discharge. Pt's post partum course was unremarkable. Her DIC resolved. Hgb stabilized. She progressed to ambulating, voiding, tolerating diet, breast pumping and good oral pain control.  Amendable for discharge home on POD # 4. Discharge instructions, medications and follow up were reviewed with pt. Interrupter was used during all of pt's daily viisits and at time of discharge.  Physical exam  Vitals:   06/16/18 1603 06/16/18 2019 06/17/18 0049 06/17/18 0743  BP: 98/67 99/83 129/84 96/67  Pulse: 97 (!) 120 (!) 114 88  Resp: 18 18 18 18   Temp: 98.1 F (36.7 C) 98.1 F (36.7 C) 98.2 F (36.8 C) 98.4 F (36.9 C)  TempSrc: Oral Oral Oral Oral  SpO2: 99% 100% 96% 98%  Weight:      Height:       General: alert Lochia: appropriate Uterine Fundus: firm Incision:  Healing well with no significant drainage DVT Evaluation: No evidence of DVT seen on physical exam. Labs: Lab Results  Component Value Date   WBC 8.0 06/17/2018   HGB 10.1 (L) 06/17/2018   HCT 28.8 (L) 06/17/2018   MCV 88.1 06/17/2018   PLT 164 06/17/2018   CMP Latest Ref Rng & Units 06/14/2018  Glucose 70 - 99 mg/dL 409(W107(H)  BUN 6 - 20 mg/dL 19  Creatinine 1.190.44 - 1.471.00 mg/dL 8.290.66  Sodium 562135 - 130145 mmol/L 135  Potassium 3.5 - 5.1 mmol/L 3.5  Chloride 98 - 111 mmol/L 104  CO2 22 - 32 mmol/L 22  Calcium 8.9 - 10.3 mg/dL 8.1(L)  Total Protein 6.5 - 8.1 g/dL 8.6(V5.5(L)  Total Bilirubin 0.3 - 1.2 mg/dL 0.7  Alkaline Phos 38 - 126 U/L 47  AST 15 - 41 U/L 46(H)  ALT 0 - 44 U/L 18    Discharge instruction: per After Visit Summary and "Baby and Me Booklet".  After visit meds:  Allergies as of 06/17/2018   No Known Allergies     Medication List  STOP taking these medications   COMFORT FIT MATERNITY SUPP LG Misc   hydroxyprogesterone caproate 250 mg/mL Oil injection Commonly known as:  MAKENA   ranitidine 150 MG tablet Commonly known as:  ZANTAC   Vitamin D 2000 units Caps     TAKE these medications   ibuprofen 800 MG tablet Commonly known as:  ADVIL,MOTRIN Take 1 tablet (800 mg total) by mouth 3 (three) times daily with meals as needed for headache or moderate pain.   oxyCODONE-acetaminophen 5-325 MG tablet Commonly known as:  PERCOCET/ROXICET Take 1 tablet by mouth every 4 (four) hours as needed for moderate pain or severe pain.   PRENATE PIXIE 10-0.6-0.4-200 MG Caps Take 1 tablet by mouth daily.       Diet: routine diet  Activity: Advance as tolerated. Pelvic rest for 6 weeks.   Outpatient follow ZO:XWRUEA for incision check and stable removal, 4 weeks for Post Op appt. Follow up Appt:No future appointments. Follow up Visit:No follow-ups on file.  Postpartum contraception: Not Discussed  Newborn Data: Live born female  Birth Weight: 4 lb 8.3 oz (2050  g) APGAR: 0, 0  Newborn Delivery   Birth date/time:  06/13/2018 18:00:00 Delivery type:  C-Section, Low Transverse Trial of labor:  No C-section categorization:  Primary     Baby Feeding: Breast Disposition:NICU   06/17/2018 Hermina Staggers, MD

## 2018-06-17 NOTE — Discharge Instructions (Signed)
Parto por Maria Duvalcesrea, cuidados posteriores Cesarean Delivery, Care After Siga estas instrucciones durante las prximas semanas. Estas indicaciones le proporcionan informacin acerca de cmo deber cuidarse despus del procedimiento. Su mdico tambin podr darle indicaciones ms especficas. El tratamiento ha sido planificado segn las prcticas mdicas actuales, pero en algunos casos pueden ocurrir problemas. Comunquese con el mdico si tiene algn problema o preguntas despus del procedimiento. Qu puedo esperar despus del procedimiento? Despus del procedimiento, es comn DIRECTVtener los siguientes sntomas:  Una pequea cantidad de sangre o un lquido transparente que sale de la incisin.  Tiene enrojecimiento, hinchazn o dolor en la zona de la incisin.  Dolores y [employer] molestias abdominales.  Hemorragia vaginal (loquios).  Calambres plvicos.  Fatiga.  Siga estas indicaciones en su casa: Cuidados de la incisin   Siga las indicaciones del mdico acerca del cuidado de la incisin. Haga lo siguiente: ? Lvese las manos con agua y jabn antes de cambiar la venda (vendaje). Use desinfectante para manos si no dispone de [place] y [place]jabn. ? Cambie el vendaje como se lo haya indicado el mdico. ? No retire los puntos (suturas), las grapas cutneas, la goma para cerrar la piel o las tiras Sligoadhesivas. Es posible que estos cierres cutneos Conservation officer, naturedeban permanecer en la piel durante 2semanas o ms tiempo. Si los bordes de las tiras [address]adhesivas empiezan a despegarse y Scientific laboratory technicianenroscarse, puede recortar los que estn sueltos. No retire las tiras Agilent Technologiesadhesivas por completo a menos que el mdico se lo indique.  Controle todos los das la zona de la incisin para detectar signos de infeccin. Est atenta a los siguientes signos: ? Aumento del enrojecimiento, la hinchazn o Chief Technology Officerel dolor. ? Mayor presencia de lquido o Afftonsangre. ? Calor. ? Pus o mal olor.  Cuando tosa o estornude, abrace Rockwell Automationuna almohada. Esto ayuda con el dolor y Apple Computerdisminuye  la posibilidad de que su incisin se abra (dehiscencia). Haga esto hasta que cicatrice completamente. Medicamentos  CenterPoint Energyome los medicamentos de venta libre y los recetados solamente como se lo haya indicado el mdico.  Si le recetaron un antibitico, tmelo como se lo haya indicado el mdico. No interrumpa la administracin del antibitico hasta que lo haya terminado. Conducir  No conduzca ni opere maquinaria pesada mientras toma analgsicos recetados.  No conduzca durante 24horas si le administraron un sedante. Estilo de vida  No beba alcohol. Esto es de suma importancia si est amamantando o toma analgsicos.  No consuma productos que contengan tabaco, incluidos cigarrillos, tabaco de Theatre managermascar o cigarrillos electrnicos. Si necesita ayuda para dejar de fumar, consulte al mdico. El tabaco puede retrasar la cicatrizacin. Qu debe comer y beber  Beba al menos 8vasos de ochoonzas (240cc) de agua todos los [address]  [address] a menos que el mdico le indique lo contrario. Si amamanta, quiz deba beber an ms cantidad de agua.  Coma alimentos ricos en Enbridge Energyfibras todos los das. Estos alimentos pueden ayudarla a prevenir o Educational psychologistaliviar el estreimiento. Los alimentos ricos en fibras incluyen, entre otros: ? Panes y cereales integrales. ? Arroz integral. ? Armed forces operational officerrijoles. ? Nils PyleFrutas y verduras frescas. Actividad  Retome sus actividades normales como se lo haya indicado el mdico. Pregntele al mdico qu actividades son seguras para usted.  Descanse todo lo que pueda. Trate de descansar o tomar una siesta mientras el beb duerme.  No levante objetos que pesen ms que su beb o ms de 10 libras (4,5 kg), como se lo haya indicado el mdico.  Pregntele al mdico cundo puede retomar la actividad sexual. Esto puede depender de  lo siguiente: ? Riesgo de sufrir una infeccin. ? Velocidad de cicatrizacin. ? Comodidad y deseo de Wach[employer] Corporation sexual. Baarse  No tome baos de inmersin, no nade ni use el jacuzzi  hasta que el mdico lo autorice. Pregntele al mdico si puede ducharse. Delle Reining solo le permitan darse baos de esponja hasta que la incisin se cure.  Mantenga el vendaje seco, como se lo haya indicado el mdico. Instrucciones generales  No use tampones ni se haga duchas vaginales hasta que el mdico la autorice.  Use lo siguiente: ? Ropa cmoda y suelta. ? Un sostn firme y [place].  Controle la sangre que elimina por la vagina para detectar cogulos de [place]. Estos pueden tener el aspecto de grumos de color rojo oscuro, o secrecin marrn o negra.  Mantenga el perineo limpio y seco, como se lo haya indicado el mdico.  Cuando vaya al bao, siempre higiencese de adelante hacia atrs.  Si es posible, pdale a alguien que la ayude a cuidar de su beb y con las tareas del hogar durante [employer] Warner despus de que le den el alta del hospital.  Chauncy Passy a todas las visitas de seguimiento para usted y el beb, como se lo haya indicado el mdico. Esto es importante. Comunquese con un mdico si:  Tiene los siguientes sntomas: ? Una secrecin vaginal con mal olor. ? Dificultad para orinar. ? Dolor al ConocoPhillips. ? Aumento o disminucin repentinos de la frecuencia de las deposiciones. ? Aumento del enrojecimiento, la hinchazn o el dolor alrededor de la incisin. ? Aumento del lquido o de la sangre que sale de la incisin. ? Pus o mal olor en Immunologist de la incisin. ? Fiebre. ? Erupcin cutnea. ? Poco inters o falta de inters en actividades que solan gustarle. ? Dudas sobre su cuidado y el del beb. ? Nuseas.  La incisin est caliente al tacto.  Siente dolor en las mamas y se ponen rojas o duras.  Siente tristeza o preocupacin de forma inusual.  Vomita.  Elimina cogulos de sangre grandes por la vagina. Si expulsa un cogulo de sangre, gurdelo para mostrrselo al American Express. No tire la cadena sin mostrarle los cogulos de sangre a su mdico.  Orina ms de lo  habitual.  Se siente mareada o se desmaya.  No ha amamantado y no ha tenido un perodo menstrual durante 12 semanas despus del [place].  Dej de amamantar al beb y no ha tenido su perodo menstrual durante 12 semanas despus de haber dejado de Museum/gallery exhibitions officer. Solicite ayuda de inmediato si:  Tiene los siguientes sntomas: ? Dolor que no desaparece o no mejora con medicamentos. ? Journalist, newspaper. ? Dificultad para respirar. ? Visin borrosa o Nurse, adult. ? Pensamientos de autolesionarse o lesionar al beb. ? Air cabin crew abdomen o en una de las piernas. ? Dolor de cabeza intenso.  Se desmaya.  Tiene una hemorragia tan intensa de la vagina que [employer] One compresas higinicas en Georgianne Fick. Esta informacin no tiene Theme park manager el consejo del mdico. Asegrese de hacerle al mdico cualquier pregunta que tenga. Document Released: 11/23/2005 Document Revised: 03/15/2017 Document Reviewed: 10/28/2015 Elsevier Interactive Patient Education  [employer].

## 2018-06-17 NOTE — Lactation Note (Addendum)
This note was copied from a baby's chart. Lactation Consultation Note  Patient Name: Maria Riley ZOXWR'UToday's Date: 06/17/2018 Reason for consult: Late-preterm 34-36.6wks;Follow-up assessment;NICU baby;Engorgement  92 hours old NICU baby, in critical condition. Mom is going home today, her RN made [facility] aware that mom is getting full/engorged. Spoke to mom, she's been pumping every 4 hours and turning all her pumped milk to her RN hoping that her baby will wake up and get some but still no brain activity per NICU RN.   She was pretty calm when entering the room, she was actually able to talk about her baby without crying, reassure her that no matter what she decides to do with her milk we'll always support her decision, even if she changes her mind at some point. Showed compassion for her situation and her baby. She has 3 other kids at home.  Reviewed engorgement prevention and treatment, mom is already putting ice packs on her breast for 20 minutes, and she'll be taking a warm shower before she goes home prior pumping.  Her right breast look pretty hard/tight, the left one was already softening. LC was able to hand express a few drops from both breast.   As today, mom wants to donate her milk. Called the [facility] [place] and made them aware that mom is Spanish speaking. Someone from the milk bank will be contacting her between today and next week to do the donor screening. [facility] that she'll be taking home. Mom and family are aware of LC services and will call if any questions or concerns arise.   Maternal Data    Feeding      Interventions Interventions: Breast feeding basics reviewed;Breast massage;Breast compression;Hand express;Expressed milk  Lactation Tools Discussed/Used     Consult Status Consult Status: Complete Date: 06/17/18 Follow-up type: Call as needed    [name] [name] 06/17/2018, 2:21 PM

## 2018-06-17 NOTE — Progress Notes (Signed)
Discharge instructions discussed ising Eda interpreter. Discussed medication changes, postpartum care and follow-up appointments.  RN answered patient's questions.

## 2018-06-20 ENCOUNTER — Encounter: Payer: Self-pay | Admitting: Obstetrics and Gynecology

## 2018-06-20 ENCOUNTER — Ambulatory Visit (INDEPENDENT_AMBULATORY_CARE_PROVIDER_SITE_OTHER): Payer: Medicaid Other | Admitting: Obstetrics and Gynecology

## 2018-06-20 VITALS — BP 104/71 | HR 88 | Wt 140.7 lb

## 2018-06-20 DIAGNOSIS — Z4802 Encounter for removal of sutures: Secondary | ICD-10-CM

## 2018-06-20 NOTE — Progress Notes (Signed)
Presents for Staple Removal/Incision check.

## 2018-06-20 NOTE — Progress Notes (Signed)
Patient here for staple removal and incison check following c-section on 7/8. Patient reports doing well. Infant still in NICU  History reviewed. No pertinent past medical history. Past Surgical History:  Procedure Laterality Date  . CESAREAN SECTION N/A 06/13/2018   Procedure: CESAREAN SECTION;  Surgeon: Salineno BingPickens, Charlie, MD;  [facility];  Service: Obstetrics;  Laterality: N/A;  . MOUTH SURGERY     Family History  Problem Relation Age of Onset  . Alcohol abuse Neg Hx   . Arthritis Neg Hx   . Asthma Neg Hx   . Birth defects Neg Hx   . Cancer Neg Hx   . COPD Neg Hx   . Depression Neg Hx   . Diabetes Neg Hx   . Drug abuse Neg Hx   . Early death Neg Hx   . Hearing loss Neg Hx   . Heart disease Neg Hx   . Hyperlipidemia Neg Hx   . Hypertension Neg Hx   . Kidney disease Neg Hx   . Learning disabilities Neg Hx   . Mental illness Neg Hx   . Mental retardation Neg Hx   . Miscarriages / Stillbirths Neg Hx   . Stroke Neg Hx   . Vision loss Neg Hx   . Varicose Veins Neg Hx    Social History   Tobacco Use  . Smoking status: Never Smoker  . Smokeless tobacco: Never Used  Substance Use Topics  . Alcohol use: No  . Drug use: No   ROS See pertinent in HPI  Blood pressure 104/71, pulse 88, weight 140 lb 11.2 oz (63.8 kg), last menstrual period 10/11/2017, unknown if currently breastfeeding. GENERAL: Well-developed, well-nourished female in no acute distress.  ABDOMEN: Soft, nontender, nondistended. No organomegaly. Staples removed. Incision healing well- no erythema, induration or drainage EXTREMITIES: No cyanosis, clubbing, or edema, 2+ distal pulses.  A/P 27 yo here for staple removal - Wound care precautions reviewed - RTC in 4 weeks for routine postpartum visit

## 2018-06-25 ENCOUNTER — Inpatient Hospital Stay (HOSPITAL_COMMUNITY)
Admission: AD | Admit: 2018-06-25 | Discharge: 2018-06-25 | Disposition: A | Discharge status: Home / Self Care | Payer: Medicaid Other | Source: Ambulatory Visit | Attending: Obstetrics and Gynecology | Admitting: Obstetrics and Gynecology

## 2018-06-25 ENCOUNTER — Inpatient Hospital Stay (HOSPITAL_COMMUNITY): Admit: 2018-06-25 | Discharge: 2018-06-25 | Disposition: A | Discharge status: Home / Self Care | Payer: Medicaid Other

## 2018-06-25 DIAGNOSIS — O9089 Other complications of the puerperium, not elsewhere classified: Secondary | ICD-10-CM | POA: Insufficient documentation

## 2018-06-25 DIAGNOSIS — M79661 Pain in right lower leg: Secondary | ICD-10-CM | POA: Insufficient documentation

## 2018-06-25 DIAGNOSIS — M79606 Pain in leg, unspecified: Secondary | ICD-10-CM | POA: Diagnosis present

## 2018-06-25 LAB — CBC WITH DIFFERENTIAL/PLATELET
Basophils Absolute: 0 10*3/uL (ref 0.0–0.1)
Basophils Relative: 0 %
Eosinophils Absolute: 0.1 10*3/uL (ref 0.0–0.7)
Eosinophils Relative: 2 %
HCT: 32.9 % — ABNORMAL LOW (ref 36.0–46.0)
Hemoglobin: 11.1 g/dL — ABNORMAL LOW (ref 12.0–15.0)
LYMPHS ABS: 2.2 10*3/uL (ref 0.7–4.0)
LYMPHS PCT: 31 %
MCH: 30.1 pg (ref 26.0–34.0)
MCHC: 33.7 g/dL (ref 30.0–36.0)
MCV: 89.2 fL (ref 78.0–100.0)
MONOS PCT: 3 %
Monocytes Absolute: 0.2 10*3/uL (ref 0.1–1.0)
Neutro Abs: 4.4 10*3/uL (ref 1.7–7.7)
Neutrophils Relative %: 64 %
Platelets: 401 10*3/uL — ABNORMAL HIGH (ref 150–400)
RBC: 3.69 MIL/uL — AB (ref 3.87–5.11)
RDW: 14.2 % (ref 11.5–15.5)
WBC: 6.9 10*3/uL (ref 4.0–10.5)

## 2018-06-25 MED ORDER — OXYCODONE-ACETAMINOPHEN 5-325 MG PO TABS
1.0000 | ORAL_TABLET | Freq: Once | ORAL | Status: AC
Start: 2018-06-25 — End: 2018-06-25
  Administered 2018-06-25: 1 via ORAL
  Filled 2018-06-25: qty 1

## 2018-06-25 NOTE — Progress Notes (Signed)
VASCULAR LAB PRELIMINARY  PRELIMINARY  PRELIMINARY  PRELIMINARY  Right lower extremity venous duplex completed.    Preliminary report:  There is no DVT or SVT noted in the right lower extremity.  Trudi Morgenthaler, RVT 06/25/2018, 7:37 PM

## 2018-06-25 NOTE — MAU Note (Signed)
Pt started having right calf pain for 2 days, started in the middle of the night. Tried a pain patch and that didn't help.   The first day it looked swollen and hot.  Currently not seeing any redness or swelling.  Pain 9/10

## 2018-06-25 NOTE — Discharge Instructions (Signed)
Dolor musculoesquelético  (Musculoskeletal Pain)  El dolor musculoesquelético comprende dolores y molestias en los músculos y en los huesos. Este dolor puede ocurrir en cualquier parte del cuerpo.  INSTRUCCIONES PARA EL CUIDADO EN EL HOGAR  · Solo tome medicamentos para calmar el dolor, el malestar o bajar la fiebre, según las indicaciones del médico.  · Podrá seguir con todas las actividades a menos que éstas le ocasionen más dolor. Cuando el dolor disminuya, retome las actividades habituales lentamente. Aumente gradualmente la intensidad y la duración de sus actividades o del ejercicio.  · Durante los períodos de dolor intenso, el reposo en cama puede ser beneficioso. Acuéstese o siéntese en cualquier posición que sea cómoda, pero salga de la cama y camine al menos cada varias horas.  · Si se lo indican, aplique hielo sobre la zona de la lesión.  ? Ponga el hielo en una bolsa plástica.  ? Coloque una toalla entre la piel y la bolsa de hielo.  ? Coloque el hielo durante 20 minutos, 2 a 3 veces por día.    SOLICITE ATENCIÓN MÉDICA SI:  · El dolor empeora.  · El dolor no se alivia con los medicamentos.  · Pierde la funcionalidad en la zona del dolor si este se manifiesta en los brazos, las piernas o el cuello.    Esta información no tiene como fin reemplazar el consejo del médico. Asegúrese de hacerle al médico cualquier pregunta que tenga.  Document Released: 09/02/2005 Document Revised: 02/15/2012 Document Reviewed: 07/28/2013  Elsevier Interactive Patient Education © 2017 Elsevier Inc.

## 2018-06-25 NOTE — H&P (Signed)
[name] is a 27 y.o. female [id number] @ 3577w0d here in MAU with complaints of abdominal pain that started this morning. Says 1 hour prior to her arrival the pain became severe and she started having bright red vaginal bleeding.   The patient was brought back to a bed via wheel chair. NP at bedside Bright red blood noted on perineum. Cervix checked 5 cm, 80%, presenting part indeterminate.  Unable to doppler fetal heart tones. IV started. Bedside US shows bradycardia unable to count/calculate bpm. Dr. Vergie LivingPickens called to bedside. STAT Cesarean called by Dr. Vergie LivingPickens.   Maria Riley, Jennifer I, NP 06/13/2018 6:26 PM            Patient seen and agree with above.

## 2018-06-25 NOTE — MAU Provider Note (Addendum)
History     CSN: [id number]  Arrival date and time: 06/25/18 1721   First Provider Initiated Contact with Patient 06/25/18 1828      Chief Complaint  Patient presents with  . Leg Pain   Maria Riley is a 27 y.o. [phone] who is S/P c-section on 06/13/18. She is here with progressively worsening calf pain over the last 2 days. She has had intermittent pain for over one week, but the last 2-3 days it has been significantly worse.   Leg Pain   The incident occurred 2 days ago. The incident occurred at home. There was no injury mechanism. Pain location: right calf  The pain is at a severity of 9/10. The pain has been constant since onset. Associated symptoms include tingling (in the morning when I go to stand up. ). She reports no foreign bodies present. She has tried nothing for the symptoms.   No past medical history on file.  Past Surgical History:  Procedure Laterality Date  . CESAREAN SECTION N/A 06/13/2018   Procedure: CESAREAN SECTION;  Surgeon: Carlton BingPickens, Charlie, MD;  [facility];  Service: Obstetrics;  Laterality: N/A;  . MOUTH SURGERY      Family History  Problem Relation Age of Onset  . Alcohol abuse Neg Hx   . Arthritis Neg Hx   . Asthma Neg Hx   . Birth defects Neg Hx   . Cancer Neg Hx   . COPD Neg Hx   . Depression Neg Hx   . Diabetes Neg Hx   . Drug abuse Neg Hx   . Early death Neg Hx   . Hearing loss Neg Hx   . Heart disease Neg Hx   . Hyperlipidemia Neg Hx   . Hypertension Neg Hx   . Kidney disease Neg Hx   . Learning disabilities Neg Hx   . Mental illness Neg Hx   . Mental retardation Neg Hx   . Miscarriages / Stillbirths Neg Hx   . Stroke Neg Hx   . Vision loss Neg Hx   . Varicose Veins Neg Hx     Social History   Tobacco Use  . Smoking status: Never Smoker  . Smokeless tobacco: Never Used  Substance Use Topics  . Alcohol use: No  . Drug use: No    Allergies: No Known Allergies  Medications Prior to Admission  Medication  Sig Dispense Refill Last Dose  . ibuprofen (ADVIL,MOTRIN) 800 MG tablet Take 1 tablet (800 mg total) by mouth 3 (three) times daily with meals as needed for headache or moderate pain. 30 tablet 1   . oxyCODONE-acetaminophen (PERCOCET/ROXICET) 5-325 MG tablet Take 1 tablet by mouth every 4 (four) hours as needed for moderate pain or severe pain. 24 tablet 0   . Prenat-FeAsp-Meth-FA-DHA w/o A (PRENATE PIXIE) 10-0.6-0.4-200 MG CAPS Take 1 tablet by mouth daily. 30 capsule 12 Past Week at Unknown time    Review of Systems  Constitutional: Negative for fever.  Respiratory: Negative for shortness of breath.   Cardiovascular: Negative for chest pain.  Gastrointestinal: Negative for nausea and vomiting.  Neurological: Positive for tingling (in the morning when I go to stand up. ).   Physical Exam   Blood pressure 107/74, pulse 85, temperature 98.3 F (36.8 C), temperature source Oral, resp. rate 16, height 4\' 11"  (1.499 m), weight 133 lb (60.3 kg), SpO2 98 %, unknown if currently breastfeeding.  Physical Exam  Nursing note and vitals reviewed. Constitutional: She is oriented  to person, place, and time. She appears well-developed and well-nourished. No distress.  HENT:  Head: Normocephalic.  Cardiovascular: Normal rate.  Respiratory: Effort normal.  GI: Soft. There is no tenderness. There is no rebound.  Musculoskeletal:  Right calf: 33 cm, tender throughout, no erythema or edema  Left calf: 32 cm, non tender   Neurological: She is alert and oriented to person, place, and time.  Skin: Skin is warm and dry.  Psychiatric: She has a normal mood and affect.    MAU Course  Procedures  MDM 1906 Care turned over to Zerita Boers, CNM  LE [place] pending   Thressa Sheller 7:06 PM 06/25/18   Assessment and Plan  Vital signs are stable.  Screening for DVT is negative no DVT seen.  CBC normal.  Discharge patient home.  Plan of care discussed with Dr. Vergie Living.

## 2018-07-08 ENCOUNTER — Encounter (HOSPITAL_COMMUNITY): Payer: Self-pay | Admitting: *Deleted

## 2018-07-08 ENCOUNTER — Inpatient Hospital Stay (HOSPITAL_COMMUNITY)
Admission: AD | Admit: 2018-07-08 | Discharge: 2018-07-09 | DRG: 885 | Disposition: A | Discharge status: Home / Self Care | Payer: Medicaid Other | Source: Intra-hospital | Attending: Psychiatry | Admitting: Psychiatry

## 2018-07-08 ENCOUNTER — Other Ambulatory Visit: Payer: Self-pay

## 2018-07-08 ENCOUNTER — Inpatient Hospital Stay (HOSPITAL_COMMUNITY)
Admission: AD | Admit: 2018-07-08 | Discharge: 2018-07-08 | Disposition: A | Discharge status: Other Acute Inpatient Hospital | Payer: Medicaid Other | Source: Ambulatory Visit | Attending: Family Medicine | Admitting: Family Medicine

## 2018-07-08 DIAGNOSIS — R45850 Homicidal ideations: Secondary | ICD-10-CM | POA: Diagnosis not present

## 2018-07-08 DIAGNOSIS — R45851 Suicidal ideations: Secondary | ICD-10-CM | POA: Diagnosis not present

## 2018-07-08 DIAGNOSIS — F332 Major depressive disorder, recurrent severe without psychotic features: Secondary | ICD-10-CM | POA: Diagnosis not present

## 2018-07-08 DIAGNOSIS — F53 Postpartum depression: Secondary | ICD-10-CM | POA: Insufficient documentation

## 2018-07-08 DIAGNOSIS — F4322 Adjustment disorder with anxiety: Secondary | ICD-10-CM | POA: Diagnosis present

## 2018-07-08 DIAGNOSIS — O99345 Other mental disorders complicating the puerperium: Secondary | ICD-10-CM

## 2018-07-08 DIAGNOSIS — F329 Major depressive disorder, single episode, unspecified: Secondary | ICD-10-CM | POA: Diagnosis not present

## 2018-07-08 LAB — COMPREHENSIVE METABOLIC PANEL
ALT: 18 U/L (ref 0–44)
AST: 20 U/L (ref 15–41)
Albumin: 3.9 g/dL (ref 3.5–5.0)
Alkaline Phosphatase: 91 U/L (ref 38–126)
Anion gap: 10 (ref 5–15)
BUN: 19 mg/dL (ref 6–20)
CO2: 22 mmol/L (ref 22–32)
CREATININE: 0.86 mg/dL (ref 0.44–1.00)
Calcium: 9.2 mg/dL (ref 8.9–10.3)
Chloride: 105 mmol/L (ref 98–111)
GFR calc Af Amer: >60 mL/min (ref >60)
GFR calc non Af Amer: >60 mL/min (ref >60)
Glucose, Bld: 91 mg/dL (ref 70–99)
Potassium: 4 mmol/L (ref 3.5–5.1)
Sodium: 137 mmol/L (ref 135–145)
TOTAL PROTEIN: 7.4 g/dL (ref 6.5–8.1)
Total Bilirubin: 0.2 mg/dL — ABNORMAL LOW (ref 0.3–1.2)

## 2018-07-08 LAB — CBC
HCT: 35.2 % — ABNORMAL LOW (ref 36.0–46.0)
Hemoglobin: 11.9 g/dL — ABNORMAL LOW (ref 12.0–15.0)
MCH: 29.8 pg (ref 26.0–34.0)
MCHC: 33.8 g/dL (ref 30.0–36.0)
MCV: 88 fL (ref 78.0–100.0)
Platelets: 332 10*3/uL (ref 150–400)
RBC: 4 MIL/uL (ref 3.87–5.11)
RDW: 13.3 % (ref 11.5–15.5)
WBC: 4.7 10*3/uL (ref 4.0–10.5)

## 2018-07-08 LAB — RAPID URINE DRUG SCREEN, HOSP PERFORMED
Amphetamines: NOT DETECTED
Barbiturates: NOT DETECTED
Benzodiazepines: POSITIVE — AB
Cocaine: NOT DETECTED
Opiates: NOT DETECTED
Tetrahydrocannabinol: NOT DETECTED

## 2018-07-08 MED ORDER — ACETAMINOPHEN 325 MG PO TABS: 650.0000 mg | ORAL_TABLET | Freq: Four times a day (QID) | ORAL | Status: DC | PRN

## 2018-07-08 MED ORDER — MAGNESIUM HYDROXIDE 400 MG/5ML PO SUSP: 30.0000 mL | Freq: Every day | ORAL | Status: DC | PRN

## 2018-07-08 MED ORDER — ALUM & MAG HYDROXIDE-SIMETH 200-200-20 MG/5ML PO SUSP: 30.0000 mL | ORAL | Status: DC | PRN

## 2018-07-08 MED ORDER — IBUPROFEN 800 MG PO TABS: 800.0000 mg | ORAL_TABLET | Freq: Three times a day (TID) | ORAL | Status: DC | PRN

## 2018-07-08 MED ORDER — IBUPROFEN 600 MG PO TABS
600.0000 mg | ORAL_TABLET | Freq: Once | ORAL | Status: AC
  Administered 2018-07-08: 600 mg via ORAL
  Filled 2018-07-08: qty 1

## 2018-07-08 MED ORDER — ALPRAZOLAM 0.5 MG PO TABS
0.5000 mg | ORAL_TABLET | Freq: Once | ORAL | Status: AC
  Administered 2018-07-08: 0.5 mg via ORAL
  Filled 2018-07-08: qty 1

## 2018-07-08 MED ORDER — HYDROXYZINE HCL 25 MG PO TABS: 25.0000 mg | ORAL_TABLET | Freq: Four times a day (QID) | ORAL | Status: DC | PRN

## 2018-07-08 MED ORDER — MIRTAZAPINE 15 MG PO TBDP
15.0000 mg | ORAL_TABLET | Freq: Every evening | ORAL | Status: DC | PRN
  Administered 2018-07-08: 15 mg via ORAL
  Filled 2018-07-08 (×6): qty 1

## 2018-07-08 MED ORDER — ENSURE ENLIVE PO LIQD: 237.0000 mL | Freq: Two times a day (BID) | ORAL | Status: DC

## 2018-07-08 NOTE — Progress Notes (Signed)
[facility] and report given to [place], [place]

## 2018-07-08 NOTE — Progress Notes (Addendum)
[facility] Reola Calkinsravis Money, NP is the accepting provider.   Dr. Jola Babinskilary is the attending provider.   Call report to [phone]   [facility].    Pt is voluntary and can be transported by [facility].  [facility].   Wells GuilesSarah Ellinore Merced, LCSW, LCAS Disposition CSW [facility]/TTS [phone]

## 2018-07-08 NOTE — Progress Notes (Signed)
Pt's pocketbook searched for items may be harmful to herself or others.  Several prescription med containers containing meds and  a belt seen.   Pocketbook and its contents removed from pt's possession.  Pocketbook stored in Medication Room in MAU and meds sent to pharmacy.

## 2018-07-08 NOTE — Tx Team (Signed)
Initial Treatment Plan 07/08/2018 11:38 PM Maria RevealLinda D Riley WUJ:[id number]    PATIENT STRESSORS: Loss of child   PATIENT STRENGTHS: General fund of knowledge Physical Health Religious Affiliation Supportive family/friends   PATIENT IDENTIFIED PROBLEMS: SI  grieving  depression    "coping skills"              DISCHARGE CRITERIA:  Improved stabilization in mood, thinking, and/or behavior Need for constant or close observation no longer present  PRELIMINARY DISCHARGE PLAN: Outpatient therapy Return to previous living arrangement  PATIENT/FAMILY INVOLVEMENT: This treatment plan has been presented to and reviewed with the patient, Maria RevealLinda D Hefter.  The patient and family have been given the opportunity to ask questions and make suggestions.  Arrie AranChurch, Meliya Mcconahy J, [place]RN 07/08/2018, 11:38 PM

## 2018-07-08 NOTE — Progress Notes (Signed)
TTS paged secondary call attempt unanswered.

## 2018-07-08 NOTE — BH Assessment (Signed)
Tele Assessment Note   Patient Name: Maria Riley Ormand MRN: [id number] Referring Physician: Duane Lopeasch, Jennifer I, NP [facility] Location of Provider: Behavioral Health TTS Department  Maria Riley Gaskins is an 27 y.o. female [facility] with complaints of depression and suicidal ideation triggered by the lost of her new born baby who was delivered July 8 via emergency C-section. The infant baby girl was born Hypoxic Ischemic Encephalopathy (HIE / brain damage). The infant lived July 8 - July 21 on a ventilator. Patient report depressive symptoms with suicidal ideations started the day her baby girl was taken off life support. Depressive symptoms crying, guilt, self-blame, decreased eating, hitting herself in the face or banging her head while in the shower triggered by missing her baby. Patient stated, "It's very hard for me dealing with the lost of my baby. I am unable to kiss her, squeeze her or do anything with her." Patient report suicidal ideations with a plan of driving of her and having an accident. "I just want to have an accident. Some days I feel like just getting in my car driving and disappearing." Patient denies homicidal ideation, auditory / visual hallucinations. Report having bad dreams, decreased sleep and no appetite.   TTS spoke with patient via spanish interpretor through [facility]. Patient present with a flat/depressed affect. Patient was tearful and crying during the assessment. Patient responding to internal / external stimuli. Patient dressed in shrubs, oriented x4. Patient report she wants helps and agrees to come inpatient hospitalization.   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 Medical history non-contributory     Past Surgical History:  Procedure Laterality Date   . CESAREAN SECTION N/A 06/13/2018   Procedure: CESAREAN SECTION;  Surgeon: [place] BingPickens, Charlie, MD;  [facility];  Service: Obstetrics;  Laterality: N/A;  . MOUTH SURGERY    . WISDOM TOOTH EXTRACTION Bilateral     Family History:  Family History  Problem Relation Age of Onset  . Alcohol abuse Neg Hx   . Arthritis Neg Hx   . Asthma Neg Hx   . Birth defects Neg Hx   . Cancer Neg Hx   . COPD Neg Hx   . Depression Neg Hx   . Diabetes Neg Hx   . Drug abuse Neg Hx   . Early death Neg Hx   . Hearing loss Neg Hx   . Heart disease Neg Hx   . Hyperlipidemia Neg Hx   . Hypertension Neg Hx   . Kidney disease Neg Hx   . Learning disabilities Neg Hx   . Mental illness Neg Hx   . Mental retardation Neg Hx   . Miscarriages / Stillbirths Neg Hx   . Stroke Neg Hx   . Vision loss Neg Hx   . Varicose Veins Neg Hx     Social History:  reports that she has never smoked. She has never used smokeless tobacco. She reports that she does not drink alcohol or use drugs.  Additional Social History:  Alcohol / Drug Use Pain Medications: see MAR Prescriptions: see MAR Over the Counter: see MAR History of alcohol / drug use?: No history of alcohol / drug abuse Longest period of sobriety (when/how long): n/a  CIWA: CIWA-Ar BP: 98/61 Pulse Rate: 85 COWS:  Allergies: No Known Allergies  Home Medications:  Medications Prior to Admission  Medication Sig Dispense Refill  . ibuprofen (ADVIL,MOTRIN) 800 MG tablet Take 1 tablet (800 mg total) by mouth 3 (three) times daily with meals as needed for headache or moderate pain. 30 tablet 1  . oxyCODONE-acetaminophen (PERCOCET/ROXICET) 5-325 MG tablet Take 1 tablet by mouth every 4 (four) hours as needed for moderate pain or severe pain. 24 tablet 0  . Prenat-FeAsp-Meth-FA-DHA w/o A (PRENATE PIXIE) 10-0.6-0.4-200 MG CAPS Take 1 tablet by mouth daily. 30 capsule 12    OB/GYN Status:  No LMP recorded.  General Assessment Data TTS  Assessment: In system Is this a Tele or Face-to-Face Assessment?: Face-to-Face Is this an Initial Assessment or a Re-assessment for this encounter?: Initial Assessment Marital status: Married [place] name: Labrum Is patient pregnant?: No Pregnancy Status: No Living Arrangements: Spouse/significant other Can pt return to current living arrangement?: Yes Admission Status: Voluntary Is patient capable of signing voluntary admission?: Yes Referral Source: Self/Family/Friend Insurance type: Medicaid      Crisis Care Plan Living Arrangements: Spouse/significant other Legal Guardian: Other:(self) Name of Psychiatrist: patient denies Name of Therapist: patient denies   Education Status Is patient currently in school?: No Is the patient employed, unemployed or receiving disability?: Unemployed  Risk to self with the past 6 months Suicidal Ideation: Yes-Currently Present(SI ideation triggered, losing her newborn baby aft) Has patient been a risk to self within the past 6 months prior to admission? : No Suicidal Intent: No Has patient had any suicidal intent within the past 6 months prior to admission? : No Is patient at risk for suicide?: Yes(sdeath of newborn child, postpartum depression) Suicidal Plan?: Yes-Currently Present(wrecking her car) Has patient had any suicidal plan within the past 6 months prior to admission? : No Specify Current Suicidal Plan: wrecking her car Access to Means: Yes Specify Access to Suicidal Means: patient owes a car What has been your use of drugs/alcohol within the last 12 months?: denies  Previous Attempts/Gestures: No How many times?: 0 Other Self Harm Risks: none report Triggers for Past Attempts: None known Intentional Self Injurious Behavior: Damaging(hitting herself in the head, face, pulling out hair) Comment - Self Injurious Behavior: hitting self in face, head, pulling out hair  Family Suicide History: No Recent stressful life event(s): Loss  (Comment)(loss of a child ) Persecutory voices/beliefs?: No Depression: Yes Depression Symptoms: Guilt, Feeling worthless/self pity, Loss of interest in usual pleasures, Insomnia Substance abuse history and/or treatment for substance abuse?: No Suicide prevention information given to non-admitted patients: Not applicable  Risk to Others within the past 6 months Homicidal Ideation: No Does patient have any lifetime risk of violence toward others beyond the six months prior to admission? : No Thoughts of Harm to Others: No Current Homicidal Intent: No Current Homicidal Plan: No Access to Homicidal Means: No Identified Victim: n/a History of harm to others?: No Assessment of Violence: None Noted Violent Behavior Description: None noted Does patient have access to weapons?: No Criminal Charges Pending?: No Does patient have a court date: No Is patient on probation?: No  Psychosis Hallucinations: None noted Delusions: None noted  Mental Status Report Appearance/Hygiene: In scrubs Eye Contact: Poor Motor Activity: Freedom of movement Speech: Other (Comment)(Spanish speaking ) Level of Consciousness: Crying Mood: Sad Affect: Sad Anxiety Level: Minimal Judgement: Impaired Orientation: Person, Place, Time, Situation Obsessive Compulsive Thoughts/Behaviors: None  Cognitive Functioning Concentration: Normal Memory: Recent Intact, Remote Intact Is patient IDD: No Is patient DD?: No  Insight: Poor Impulse Control: Poor Appetite: Poor(report not hungry) Have you had any weight changes? : No Change Sleep: Decreased Total Hours of Sleep: (report little hours of sleep ) Vegetative Symptoms: None  ADLScreening [facility] Assessment Services) Patient's cognitive ability adequate to safely complete daily activities?: Yes Patient able to express need for assistance with ADLs?: Yes Independently performs ADLs?: Yes (appropriate for developmental age)  Prior Inpatient Therapy Prior  Inpatient Therapy: No  Prior Outpatient Therapy Prior Outpatient Therapy: No Does patient have an ACCT team?: No Does patient have Intensive In-House Services?  : No Does patient have [facility] services? : No Does patient have P4CC services?: No  ADL Screening (condition at time of admission) Patient's cognitive ability adequate to safely complete daily activities?: Yes Is the patient deaf or have difficulty hearing?: No Does the patient have difficulty seeing, even when wearing glasses/contacts?: No Does the patient have difficulty concentrating, remembering, or making decisions?: No Patient able to express need for assistance with ADLs?: Yes Does the patient have difficulty dressing or bathing?: No Independently performs ADLs?: Yes (appropriate for developmental age) Does the patient have difficulty walking or climbing stairs?: No Weakness of Legs: None Weakness of Arms/Hands: None  Home Assistive Devices/Equipment Home Assistive Devices/Equipment: None  Therapy Consults (therapy consults require a physician order) PT Evaluation Needed: No OT Evalulation Needed: No SLP Evaluation Needed: No Abuse/Neglect Assessment (Assessment to be complete while patient is alone) Abuse/Neglect Assessment Can Be Completed: Yes Physical Abuse: Denies Verbal Abuse: Denies Sexual Abuse: Denies Exploitation of patient/patient's resources: Denies Self-Neglect: Denies Values / Beliefs Cultural Requests During Hospitalization: None Spiritual Requests During Hospitalization: None Consults Spiritual Care Consult Needed: No Social Work Consult Needed: No Merchant navy officer (For Healthcare) Does Patient Have a Medical Advance Directive?: No Would patient like information on creating a medical advance directive?: No - Patient declined Nutrition Screen- [facility] Adult/WL/AP Patient's home diet: Regular        Disposition:  Disposition Initial Assessment Completed for this Encounter: Yes(Travis  Money, NP, recommend inpt hospitalization )   Yamil Oelke 07/08/2018 4:13 PM

## 2018-07-08 NOTE — MAU Note (Addendum)
Pt presents to MAU with c/o having both suicidal & homicidal thoughts.  Pt has voiced to hospital SW of intentionally crashing car and has thoughts of harming children @ home so the entire family will be together and join pt's infant that recently passed. Pt s/p emergency Cesarean Section on June 13, 2018.  Infant survived 13 days in NICU on life support until decision to withdraw care.  Infant passed on June 26, 2018.

## 2018-07-08 NOTE — Progress Notes (Signed)
Admission assessment completed using [employer] interpreting.  Berneice HeinrichManny was Nurse, learning disabilitytranslator.  Pt is a 27 year old female [facility] for SI with a plan to crash car.  When asked why she is here, pt states "I don't know, they brought me here from the other hospital because I asked for help because I lost my daughter."  Pt gave birth by C-section on 06/13/18 and the baby was in NICU.  The baby passed away and pt is grieving her daughter's death.  She is tearful when discussing [facility].  She denies tobacco, alcohol, and drug use.  She reports she has had decreased appetite since losing her child.  Denies history of abuse.  During admission assessment, she denies SI/HI, hallucinations, and pain.  Her gait is slow and steady.  She reports "I'm not able to sleep and when I try to sleep I have nightmares."  She reports her husband is supportive.  Pt reports she is okay with having a roommate at this time.  She would like to work on Conservation officer, historic buildingsdeveloping coping skills [facility].    Introduced self to pt.  Admission process and paperwork completed with pt using interpreting service.  Belongings searched for contraband and items not allowed on unit are in locker 39.  Non-invasive body assessment was completed by female RN and was unremarkable.  Fall prevention techniques reviewed with pt, pt verbalized understanding.  Pt oriented to unit and room.  Pt provided with meal and PO fluids.  PO fluids encouraged.  On-site provider contacted for admission orders and orders placed.  Medication administered per order.  Q15 minute safety checks in place.  Pt is cooperative with admission process.  She verbally contracts for safety and agrees to notify staff of needs and concerns.  She is safe on the unit.  Writer showed pt how to dial out on unit phone and she called her husband.  Will continue to monitor and assess.

## 2018-07-08 NOTE — Progress Notes (Signed)
RN spoke to [facility] @ [facility] Health for update concerning pt inpatient bed placement.   Informed awaiting placement.

## 2018-07-08 NOTE — MAU Note (Signed)
Pt. Is talking to Interpreter Pamala Hurry(Veria) at bedside, still very tearful and sad

## 2018-07-08 NOTE — Progress Notes (Addendum)
TTS notified, RN spoke to counselor regarding consult.  Chaplain into see pt @ this time.   Interpreter, Eta, in attendance.

## 2018-07-08 NOTE — MAU Note (Signed)
Food ordered.

## 2018-07-08 NOTE — Progress Notes (Signed)
Will inform nurse

## 2018-07-08 NOTE — Progress Notes (Signed)
CSW called by [place] Interpreter/E. Royal stating patient presented to the hospital asking her for help due to symptoms of depression and need for counseling.  CSW met with patient in the consult room on the first floor to offer support and complete assessment.  CSW is familiar with patient from her delivery on 06/13/18 of baby admitted to NICU for severe HIE and subsequent loss.  CSW was not present when decision was made to withdraw care.   MOB was very tearful and states that she has been crying all the time and told CSW, "half my heart is with my baby and the other half is with my other children."  She reports feeling like she wants to die in order to be with her baby and states she has been banging her head against the wall in the shower and trying to pull her hair out.  She states she is "worried about harm."  CSW asked her to talk more about what she means about this.  She states she is worried she might harm herself.  She said, "I often think about getting in my car and crashing it on purpose."  She states this is in hopes to die.  She added, "I know this is not my other kids' fault, but I think about taking them with me."  She did not speak about a specific plan to kill her children, but reports intrusive thoughts about killing the entire family so they will all be together.  CSW discussed her feelings as a normal grief reaction, while stating concern for her and her children's safety.  CSW provided crisis intervention counseling while she continued to process her thoughts and feelings.  CSW explained that there are resources for free grief counseling for her, her husband and her children ([facility]-brochure given and explained) but that CSW's main concern today is that we provide support to patient in ensuring her safety.  CSW thanked her for coming today and acknowledged her courage for talking about the feelings and thoughts she has had lately. CSW escorted patient to MAU and signed her in for  depression and suicidal/homicidal ideation.  Patient was willing.  CSW stayed with patient for support until she was admitted.  CSW informed staff of patient's concern and recommends TTS to evaluate for inpatient admission.  CSW spoke with Chaplain/B. Hamilton who is covering [facility] today, who will come to see patient this afternoon.

## 2018-07-08 NOTE — Progress Notes (Signed)
Pt reports she's been pumping and breast are full & tender.  Reports breast beginning to leak.

## 2018-07-08 NOTE — MAU Note (Signed)
[facility] called. States it will "be awhile" before we can come. [facility]. States should be able to have her there by then.

## 2018-07-08 NOTE — MAU Provider Note (Signed)
History     CSN: [id number]  Arrival date and time: 07/08/18 1257   First Provider Initiated Contact with Patient 07/08/18 1413      Chief Complaint  Patient presents with  . PP Depression Eval   HPI  Spanish interpretor present:  Maria Riley is a 27 y.o. female [phone] status post partum. Patient had an emergency cesarean section on 7/8 and her baby did not survive. Says that she is severally depressed and is having a hard time coping with the loss of her baby. Says she is torn between wanting to stay here on earth with her children and going to heaven with her baby. Says "I talk to my baby and ask my baby to take me to heaven".    OB History    Gravida  4   Para  4   Term  0   Preterm  4   AB  0   Living  4     SAB  0   TAB  0   Ectopic  0   Multiple  0   Live Births  4           Past Medical History:  Diagnosis Date  . Medical history non-contributory     Past Surgical History:  Procedure Laterality Date  . CESAREAN SECTION N/A 06/13/2018   Procedure: CESAREAN SECTION;  Surgeon: Aletha Halim, MD;  Location: [facility];  Service: Obstetrics;  Laterality: N/A;  . MOUTH SURGERY    . WISDOM TOOTH EXTRACTION Bilateral     Family History  Problem Relation Age of Onset  . Alcohol abuse Neg Hx   . Arthritis Neg Hx   . Asthma Neg Hx   . Birth defects Neg Hx   . Cancer Neg Hx   . COPD Neg Hx   . Depression Neg Hx   . Diabetes Neg Hx   . Drug abuse Neg Hx   . Early death Neg Hx   . Hearing loss Neg Hx   . Heart disease Neg Hx   . Hyperlipidemia Neg Hx   . Hypertension Neg Hx   . Kidney disease Neg Hx   . Learning disabilities Neg Hx   . Mental illness Neg Hx   . Mental retardation Neg Hx   . Miscarriages / Stillbirths Neg Hx   . Stroke Neg Hx   . Vision loss Neg Hx   . Varicose Veins Neg Hx     Social History   Tobacco Use  . Smoking status: Never Smoker  . Smokeless tobacco: Never Used  Substance Use Topics  .  Alcohol use: No  . Drug use: No    Allergies: No Known Allergies  Medications Prior to Admission  Medication Sig Dispense Refill Last Dose  . ibuprofen (ADVIL,MOTRIN) 800 MG tablet Take 1 tablet (800 mg total) by mouth 3 (three) times daily with meals as needed for headache or moderate pain. 30 tablet 1   . oxyCODONE-acetaminophen (PERCOCET/ROXICET) 5-325 MG tablet Take 1 tablet by mouth every 4 (four) hours as needed for moderate pain or severe pain. 24 tablet 0   . Prenat-FeAsp-Meth-FA-DHA w/o A (PRENATE PIXIE) 10-0.6-0.4-200 MG CAPS Take 1 tablet by mouth daily. 30 capsule 12 Past Week at Unknown time   Results for orders placed or performed during the hospital encounter of 07/08/18 (from the past 48 hour(s))  CBC     Status: Abnormal   Collection Time: 07/08/18  4:40 PM  Result Value Ref  Range   WBC 4.7 4.0 - 10.5 K/uL   RBC 4.00 3.87 - 5.11 MIL/uL   Hemoglobin 11.9 (L) 12.0 - 15.0 g/dL   HCT 35.2 (L) 36.0 - 46.0 %   MCV 88.0 78.0 - 100.0 fL   MCH 29.8 26.0 - 34.0 pg   MCHC 33.8 30.0 - 36.0 g/dL   RDW 13.3 11.5 - 15.5 %   Platelets 332 150 - 400 K/uL    Comment: Performed at [facility], [address], [address], [place] [address]  Comprehensive metabolic panel     Status: Abnormal   Collection Time: 07/08/18  4:40 PM  Result Value Ref Range   Sodium 137 135 - 145 mmol/L   Potassium 4.0 3.5 - 5.1 mmol/L   Chloride 105 98 - 111 mmol/L   CO2 22 22 - 32 mmol/L   Glucose, Bld 91 70 - 99 mg/dL   BUN 19 6 - 20 mg/dL   Creatinine, Ser 0.86 0.44 - 1.00 mg/dL   Calcium 9.2 8.9 - 10.3 mg/dL   Total Protein 7.4 6.5 - 8.1 g/dL   Albumin 3.9 3.5 - 5.0 g/dL   AST 20 15 - 41 U/L   ALT 18 0 - 44 U/L   Alkaline Phosphatase 91 38 - 126 U/L   Total Bilirubin 0.2 (L) 0.3 - 1.2 mg/dL   GFR calc non Af Amer >60 >60 mL/min   GFR calc Af Amer >60 >60 mL/min    Comment: (NOTE) The eGFR has been calculated using the CKD EPI equation. This calculation has not been validated in all  clinical situations. eGFR's persistently <60 mL/min signify possible Chronic Kidney Disease.    Anion gap 10 5 - 15    Comment: Performed at [facility], [address] [place], [place] [address]  Urine rapid drug screen (hosp performed)     Status: Abnormal   Collection Time: 07/08/18  5:29 PM  Result Value Ref Range   Opiates NONE DETECTED NONE DETECTED   Cocaine NONE DETECTED NONE DETECTED   Benzodiazepines POSITIVE (A) NONE DETECTED   Amphetamines NONE DETECTED NONE DETECTED   Tetrahydrocannabinol NONE DETECTED NONE DETECTED   Barbiturates NONE DETECTED NONE DETECTED    Comment: (NOTE) DRUG SCREEN FOR MEDICAL PURPOSES ONLY.  IF CONFIRMATION IS NEEDED FOR ANY PURPOSE, NOTIFY LAB WITHIN 5 DAYS. LOWEST DETECTABLE LIMITS FOR URINE DRUG SCREEN Drug Class                     Cutoff (ng/mL) Amphetamine and metabolites    1000 Barbiturate and metabolites    200 Benzodiazepine                 166 Tricyclics and metabolites     300 Opiates and metabolites        300 Cocaine and metabolites        300 THC                            50 Performed at [facility], [address] [place],  [address]    Review of Systems  Constitutional: Negative for fever.  Gastrointestinal: Negative for abdominal pain.  Genitourinary: Negative for dysuria.  Psychiatric/Behavioral: Positive for suicidal ideas. Negative for sleep disturbance. The patient is nervous/anxious.    Physical Exam   Blood pressure 98/61, pulse 85, temperature 98.4 F (36.9 C), temperature source Oral, resp. rate 18, unknown if currently breastfeeding.  Physical Exam  Constitutional: She is oriented to person, place, and time. She appears well-developed and well-nourished. No distress.  HENT:  Head: Normocephalic.  Eyes: Pupils are equal, round, and reactive to light.  Respiratory: She exhibits no mass. Right breast exhibits tenderness. Right breast exhibits no inverted nipple, no mass, no nipple  discharge and no skin change. Left breast exhibits tenderness. Left breast exhibits no inverted nipple, no mass, no nipple discharge and no skin change. Breasts are symmetrical.  GI: Soft.  Musculoskeletal: Normal range of motion.  Neurological: She is alert and oriented to person, place, and time.  Skin: Skin is warm. She is not diaphoretic.  Psychiatric: Her mood appears not anxious. She is withdrawn. Thought content is not paranoid and not delusional. She does not express impulsivity or inappropriate judgment. She exhibits a depressed mood. She expresses suicidal ideation. She expresses no homicidal ideation. She expresses no suicidal plans and no homicidal plans.   MAU Course  Procedures  None  MDM  Social work present TTS consult Xanax 0.5 mg PO given  Ibuprofen 600 mg PO  Patient medically cleared for inpatient psychiatric treatment.   Assessment and Plan   A:  1. Postpartum depression   2. Suicidal ideation     P:  TTS recommends inpatient therapy and patient is agreeable Xanax given 0.5 mg PO Ibuprofen 600 mg PO Patient medically cleared for inpatient psychiatric treatment.   Noni Saupe I, NP 07/08/2018 7:00 PM

## 2018-07-08 NOTE — Progress Notes (Signed)
[place] driver [facility], consent for voluntary commitment explained to pt via interpreter.  Pt signed and witnessed.

## 2018-07-08 NOTE — Progress Notes (Addendum)
TTS consult currently in process.  Interpreter, Veria, @ bedside in attendance.

## 2018-07-08 NOTE — MAU Note (Signed)
Social worker ask pt. If it was ok if chaplin come see her, pt. Stated it is ok. (interpurted by NordstromEda)

## 2018-07-09 DIAGNOSIS — F332 Major depressive disorder, recurrent severe without psychotic features: Principal | ICD-10-CM

## 2018-07-09 MED ORDER — HYDROXYZINE HCL 25 MG PO TABS: 25.0000 mg | ORAL_TABLET | Freq: Four times a day (QID) | ORAL | 0 refills | Status: DC | PRN

## 2018-07-09 MED ORDER — MIRTAZAPINE 15 MG PO TBDP: 15.0000 mg | ORAL_TABLET | Freq: Every evening | ORAL | 0 refills | Status: DC | PRN

## 2018-07-09 NOTE — [facility] Suicide Risk Assessment (Addendum)
[facility] INPATIENT:  Family/Significant Other Suicide Prevention Education  Suicide Prevention Education:  Education Completed; Completed with patient face to face prior to discharge,  (name of family member/significant other) has been identified by the patient as the family member/significant other with whom the patient will be residing, and identified as the person(s) who will aid the patient in the event of a mental health crisis (suicidal ideations/suicide attempt).  With written consent from the patient, the family member/significant other has been provided the following suicide prevention education, prior to the and/or following the discharge of the patient.  The suicide prevention education provided includes the following:  Suicide risk factors  Suicide prevention and interventions  National Suicide Hotline telephone number  [facility] assessment telephone number  [facility] Emergency Assistance 911  [facility] and/or Residential Mobile Crisis [facility] telephone number  Request made of family/significant other to:  Remove weapons (e.g., guns, rifles, knives), all items previously/currently identified as safety concern.    Remove drugs/medications (over-the-counter, prescriptions, illicit drugs), all items previously/currently identified as a safety concern.  The family member/significant other verbalizes understanding of the suicide prevention education information provided.  The family member/significant other agrees to remove the items of safety concern listed above. Maria Riley was provided to patient and husband.  Maria Riley 07/09/2018, 2:13 PM

## 2018-07-09 NOTE — [facility] Suicide Risk Assessment (Signed)
[facility]   Principal Problem: <principal problem not specified> Discharge Diagnoses:  Patient Active Problem List   Diagnosis Date Noted  . MDD (major depressive disorder), recurrent episode, severe (HCC) [F33.2] 07/08/2018  . DIC (disseminated intravascular coagulation) (HCC) [D65] 06/13/2018  . Antepartum placental abruption [O45.90] 06/13/2018  . PPH (postpartum hemorrhage) [O72.1] 06/13/2018  . Language barrier [Z78.9] 06/13/2018  . Supervision of high risk pregnancy, antepartum [O09.90] 11/23/2016  . History of preterm delivery [Z87.51] 11/23/2016    Total Time spent with patient: 45 minutes  Musculoskeletal: Strength & Muscle Tone: within normal limits Gait & Station: normal Patient leans: N/A  Psychiatric Specialty Exam: Review of Systems  Gastrointestinal: Positive for abdominal pain.  All other systems reviewed and are negative.   Blood pressure (!) 93/55, pulse 67, temperature 99.2 F (37.3 C), temperature source Oral, resp. rate 16, height 4\' 10"  (1.473 m), weight 60.8 kg (134 lb), unknown if currently breastfeeding.Body mass index is 28.01 kg/m.  General Appearance: Casual  Eye Contact::  Fair  Speech:  Normal Rate409  Volume:  Decreased  Mood:  Depressed  Affect:  Congruent  Thought Process:  Coherent  Orientation:  Full (Time, Place, and Person)  Thought Content:  Logical  Suicidal Thoughts:  No  Homicidal Thoughts:  No  Memory:  Immediate;   Fair Recent;   Fair Remote;   Fair  Judgement:  Intact  Insight:  Fair  Psychomotor Activity:  Decreased  Concentration:  Fair  Recall:  Good  Fund of Knowledge:Fair  Language: Good  Akathisia:  Negative  Handed:  Right  AIMS (if indicated):     Assets:  Communication Skills Desire for Improvement Housing Physical Health Resilience Social Support  Sleep:  Number of Hours: 5.25  Cognition: WNL  ADL's:  Intact   Mental Status Per Nursing Assessment::   On Admission:   NA  Demographic Factors:  Adolescent or young adult  Loss Factors: NA  Historical Factors: Impulsivity  Risk Reduction Factors:   Responsible for children under 26 years of age, Sense of responsibility to family, Religious beliefs about death, Living with another person, especially a relative and Positive social support  Continued Clinical Symptoms:  Depression:   Anhedonia Impulsivity  Cognitive Features That Contribute To Risk:  None    Suicide Risk:  Minimal: No identifiable suicidal ideation.  Patients presenting with no risk factors but with morbid ruminations; may be classified as minimal risk based on the severity of the depressive symptoms    Plan Of Care/Follow-up recommendations:  Activity:  ad lib  Antonieta PertGreg Lawson Clary, MD 07/09/2018, 12:37 PM

## 2018-07-09 NOTE — Progress Notes (Signed)
D With MD present,  This Clinical research associatewriter utilized  Runner, broadcasting/film/videovideo interpreter and MD ( Dr. Jola Babinskilary) interviewed pt - with her husband present. Pt adamant that she is not suicidal. Pt adamanat that the reason she went to the hospital originally was to get out patient therapies ( arranged )  to deal with her overwhelming feelings of sadness. ( related to the recent death of her newborn).  Pt adamant that she wants to live " I need to be there for my children" and also pt able to verbalize that even in her current state of sadness and grief, both she and he husband are cognizant that they must " be there for the children". She contracts for safety and says " I have to be there to take care of my other children". She denies previous suicide attempt , any mention of mental illness in her family, regular medicationns that she might take daily and Clinical research associatewriter asks Dr. Jola Babinskilary if it  Is possible she be connected /  With some form of grief counselor. She told Dr. Jola Babinskilary that she is aware that she will have some very sad  times ...but she knows she and her husband " need to be home to take care of my other chiildren". Using vidoe interpretor, this Clinical research associatewriter completed pt's discharge instrucitons, SRA, AVS and and transition record, as well as giving pt the names and contact numbers for Hospice as well as area grief counselor info- so that she can call and make the appt herself. Per protocol,all pt's belongings returned to pt, pt denied active SI, stated she is " safe" and that she understands all dc teaching. Pt and ehr husband were escorted to bldg entrance and pt dc'd per MD order

## 2018-07-09 NOTE — [facility] Group Notes (Signed)
[facility] Group Notes:  (Nursing/MHT/Case Management/Adjunct)  Date:  07/09/2018  Time:  2:34 PM  Type of Therapy:  Psychoeducational Skills  Participation Level:  Did Not Attend  Participation Quality:  Did not attend  Affect:  Did not attend  Cognitive:  Did not attend  Insight:  None  Engagement in Group:  Did not attend  Modes of Intervention:  Did not attend  Summary of Progress/Problems: Pt did not attend Psychoeducational group with topic anger management.  Jacquelyne BalintForrest, Brittany Osier Shanta 07/09/2018, 2:34 PM

## 2018-07-09 NOTE — [facility] Suicide Risk Assessment (Signed)
[facility] Admission Suicide Risk Assessment   Nursing information obtained from:  Patient Demographic factors:  Unemployed Current Mental Status:  NA Loss Factors:  Loss of significant relationship Historical Factors:  NA Risk Reduction Factors:  Responsible for children under 26 years of age, Sense of responsibility to family, Living with another person, especially a relative, Positive social support  Total Time spent with patient: 45 minutes Principal Problem: <principal problem not specified> Diagnosis:   Patient Active Problem List   Diagnosis Date Noted  . MDD (major depressive disorder), recurrent episode, severe (HCC) [F33.2] 07/08/2018  . DIC (disseminated intravascular coagulation) (HCC) [D65] 06/13/2018  . Antepartum placental abruption [O45.90] 06/13/2018  . PPH (postpartum hemorrhage) [O72.1] 06/13/2018  . Language barrier [Z78.9] 06/13/2018  . Supervision of high risk pregnancy, antepartum [O09.90] 11/23/2016  . History of preterm delivery [Z87.51] 11/23/2016   Subjective Data: Patient is seen and examined.  Patient is a 27 year old Hispanic female who presented for voluntary admission to the behavioral health hospital.  The patient stated that she went to the [facility] yesterday.  The patient recently had an infant baby girl born with hypoxic ischemic encephalopathy.  The infant lived 12 days on a ventilator.  The patient stated she went to the hospital to get resources for psychological treatment and therapy.  The assessment of the patient was that she was depressive, suicidal, had decreased eating and was hitting herself in the face.  The patient's husband was with her today and corroborated her story.  She stated that she had had crying spells and was sad, but did not feel suicidal.  She did not feel as though she needed to be in the hospital.  She denied any previous history of psychiatric problems, suicidal ideation, family history of suicidal ideation, psychiatric  admissions or psychiatric medications.  She was admitted to the hospital for evaluation and stabilization.  Continued Clinical Symptoms:  Alcohol Use Disorder Identification Test Final Score (AUDIT): 0 The "Alcohol Use Disorders Identification Test", Guidelines for Use in Primary Care, Second Edition.  World Science writer [facility]). Score between 0-7:  no or low risk or alcohol related problems. Score between 8-15:  moderate risk of alcohol related problems. Score between 16-19:  high risk of alcohol related problems. Score 20 or above:  warrants further diagnostic evaluation for alcohol dependence and treatment.   CLINICAL FACTORS:   Depression:   Anhedonia Insomnia Medical Diagnoses and Treatments/Surgeries   Musculoskeletal: Strength & Muscle Tone: within normal limits Gait & Station: normal Patient leans: N/A  Psychiatric Specialty Exam: Physical Exam  Nursing note and vitals reviewed. Constitutional: She is oriented to person, place, and time. She appears well-developed and well-nourished.  HENT:  Head: Normocephalic and atraumatic.  Respiratory: Effort normal.  Neurological: She is alert and oriented to person, place, and time.    ROS  Blood pressure (!) 93/55, pulse 67, temperature 99.2 F (37.3 C), temperature source Oral, resp. rate 16, height 4\' 10"  (1.473 m), weight 60.8 kg (134 lb), unknown if currently breastfeeding.Body mass index is 28.01 kg/m.  General Appearance: Casual  Eye Contact:  Fair  Speech:  Normal Rate  Volume:  Decreased  Mood:  Depressed  Affect:  Congruent  Thought Process:  Coherent  Orientation:  Full (Time, Place, and Person)  Thought Content:  Logical  Suicidal Thoughts:  No  Homicidal Thoughts:  No  Memory:  Immediate;   Fair Recent;   Fair Remote;   Fair  Judgement:  Intact  Insight:  Fair  Psychomotor Activity:  Decreased  Concentration:  Concentration: Fair and Attention Span: Fair  Recall:  [employer]Fair  Fund of Knowledge:  Fair   Language:  Good  Akathisia:  Negative  Handed:  Right  AIMS (if indicated):     Assets:  Communication Skills Desire for Improvement Housing Intimacy Physical Health Resilience Social Support  ADL's:  Intact  Cognition:  WNL  Sleep:  Number of Hours: 5.25      COGNITIVE FEATURES THAT CONTRIBUTE TO RISK:  None    SUICIDE RISK:   Minimal: No identifiable suicidal ideation.  Patients presenting with no risk factors but with morbid ruminations; may be classified as minimal risk based on the severity of the depressive symptoms  PLAN OF CARE: Patient is seen and examined.  Patient is a 27 year old female with the above-stated past psychiatric history who was transferred to our facility secondary to depression.  The patient denies any suicidal ideation.  She has no history of self-harm.  She is not psychotic.  She is depressed, and does desire psychological treatment.  Her husband supports her on this and was in the room for the interview for the entire process.  We will get her the resources that are available in the community.  We will get her those phone numbers.  I will continue the Remeron.  I told them if she develops any suicidal ideation to return to the hospital.  Otherwise I plan on discharging her today.  I certify that inpatient services furnished can reasonably be expected to improve the patient's condition.   Maria PertGreg Riley Maria Salminen, MD 07/09/2018, 12:31 PM

## 2018-07-09 NOTE — BHH Counselor (Signed)
PSA was unable to be completed due to arrival on 8/2 and discharge on 8/3.

## 2018-07-09 NOTE — B[facility] Counselor (Signed)
CSW printed information [facility] for [facility] to deal with grief of lost child.

## 2018-07-09 NOTE — Discharge Summary (Signed)
Physician Discharge Summary Note  Patient:  Maria Riley is an 27 y.o., female MRN:  [id number] DOB:  11/28/1991 Patient phone:  [phone] (home)  Patient address:   [place] [address],  Total Time spent with patient: 20 minutes  Date of Admission:  07/08/2018 Date of Discharge: 07/09/2018  Reason for Admission:  Per assessment note:CSW called by Spanish Interpreter/E. Royal stating patient presented to the hospital asking her for help due to symptoms of depression and need for counseling.  CSW met with patient in the consult room on the first floor to offer support and complete assessment.  CSW is familiar with patient from her delivery on 06/13/18 of baby admitted to NICU for severe HIE and subsequent loss.  CSW was not present when decision was made to withdraw care.   MOB was very tearful and states that she has been crying all the time and told CSW, "half my heart is with my baby and the other half is with my other children."  She reports feeling like she wants to die in order to be with her baby and states she has been banging her head against the wall in the shower and trying to pull her hair out.  She states she is "worried about harm."  CSW asked her to talk more about what she means about this.  She states she is worried she might harm herself.  She said, "I often think about getting in my car and crashing it on purpose."  She states this is in hopes to die.  She added, "I know this is not my other kids' fault, but I think about taking them with me."  She did not speak about a specific plan to kill her children, but reports intrusive thoughts about killing the entire family so they will all be together.  CSW discussed her feelings as a normal grief reaction, while stating concern for her and her children's safety.  CSW provided crisis intervention counseling while she continued to process her thoughts and feelings.  CSW explained that there are resources for free grief  counseling for her, her husband and her children ([facility]-brochure given and explained) but that CSW's main concern today is that we provide support to patient in ensuring her safety.  CSW thanked her for coming today and acknowledged her courage for talking about the feelings and thoughts she has had lately. CSW escorted patient to MAU and signed her in for depression and suicidal/homicidal ideation.  Patient was willing.  CSW stayed with patient for support until she was admitted.  CSW informed staff of patient's concern and recommends TTS to evaluate for inpatient admission.  CSW spoke with Chaplain/B. Hamilton who is covering [facility] today, who will come to see patient this afternoon  Per SRA (sucide risk assessment) by MD: PLAN OF CARE: Patient is seen and examined.  Patient is a 27 year old female with the above-stated past psychiatric history who was transferred to our facility secondary to depression.  The patient denies any suicidal ideation.  She has no history of self-harm.  She is not psychotic.  She is depressed, and does desire psychological treatment.  Her husband supports her on this and was in the room for the interview for the entire process.  We will get her the resources that are available in the community.  We will get her those phone numbers.  I will continue the Remeron.  I told them if she develops any suicidal ideation to return  to the hospital.  Otherwise I plan on discharging her today.        Principal Problem: MDD (major depressive disorder), recurrent episode, severe [facility]) Discharge Diagnoses: Patient Active Problem List   Diagnosis Date Noted  . MDD (major depressive disorder), recurrent episode, severe ([place]) [F33.2] 07/08/2018  . DIC (disseminated intravascular coagulation) ([place]) [D65] 06/13/2018  . Antepartum placental abruption [O45.90] 06/13/2018  . PPH (postpartum hemorrhage) [O72.1] 06/13/2018  . Language barrier [Z78.9] 06/13/2018  .  Supervision of high risk pregnancy, antepartum [O09.90] 11/23/2016  . History of preterm delivery [Z87.51] 11/23/2016    Past Psychiatric History:   Past Medical History:  Past Medical History:  Diagnosis Date  . Medical history non-contributory     Past Surgical History:  Procedure Laterality Date  . CESAREAN SECTION N/A 06/13/2018   Procedure: CESAREAN SECTION;  Surgeon: Aletha Halim, MD;  Location: [facility];  Service: Obstetrics;  Laterality: N/A;  . MOUTH SURGERY    . WISDOM TOOTH EXTRACTION Bilateral    Family History:  Family History  Problem Relation Age of Onset  . Alcohol abuse Neg Hx   . Arthritis Neg Hx   . Asthma Neg Hx   . Birth defects Neg Hx   . Cancer Neg Hx   . COPD Neg Hx   . Depression Neg Hx   . Diabetes Neg Hx   . Drug abuse Neg Hx   . Early death Neg Hx   . Hearing loss Neg Hx   . Heart disease Neg Hx   . Hyperlipidemia Neg Hx   . Hypertension Neg Hx   . Kidney disease Neg Hx   . Learning disabilities Neg Hx   . Mental illness Neg Hx   . Mental retardation Neg Hx   . Miscarriages / Stillbirths Neg Hx   . Stroke Neg Hx   . Vision loss Neg Hx   . Varicose Veins Neg Hx    Family Psychiatric  History:  Social History:  Social History   Substance and Sexual Activity  Alcohol Use No     Social History   Substance and Sexual Activity  Drug Use No    Social History   Socioeconomic History  . Marital status: Married    Spouse name: Not on file  . Number of children: Not on file  . Years of education: Not on file  . Highest education level: Not on file  Occupational History  . Not on file  Social Needs  . Financial resource strain: Not on file  . Food insecurity:    Worry: Not on file    Inability: Not on file  . Transportation needs:    Medical: Not on file    Non-medical: Not on file  Tobacco Use  . Smoking status: Never Smoker  . Smokeless tobacco: Never Used  Substance and Sexual Activity  . Alcohol use: No  .  Drug use: No  . Sexual activity: Yes    Birth control/protection: None  Lifestyle  . Physical activity:    Days per week: Not on file    Minutes per session: Not on file  . Stress: Not on file  Relationships  . Social connections:    Talks on phone: Not on file    Gets together: Not on file    Attends religious service: Not on file    Active member of club or organization: Not on file    Attends meetings of clubs or organizations: Not on file  Relationship status: Not on file  Other Topics Concern  . Not on file  Social History Narrative  . Not on file    Hospital Course:  TAMI BLASS was admitted for MDD (major depressive disorder), recurrent episode, severe ([place])  and crisis management.  Pt was treated discharged with the medications listed below under Medication List.  Medical problems were identified and treated as needed.  Home medications were restarted as appropriate.  Improvement was monitored by observation and Billy Coast 's daily report of symptom reduction.  Emotional and mental status was monitored by daily self-inventory reports completed by Billy Coast and clinical staff.         Tia Masker Bernier was evaluated by the treatment team for stability and plans for continued recovery upon discharge. Billy Coast 's motivation was an integral factor for scheduling further treatment. Employment, transportation, bed availability, health status, family support, and any pending legal issues were also considered during hospital stay. Pt was offered further treatment options upon discharge including but not limited to Residential, Intensive Outpatient, and Outpatient treatment.  Billy Coast will follow up with the services as listed below under Follow Up Information.     Upon completion of this admission the patient was both mentally and medically stable for discharge denying suicidal/homicidal ideation, auditory/visual/tactile hallucinations, delusional thoughts and  paranoia.  Was reported by attending psychiatrist patient to discharge and follow-up with grief counseling.    Tia Masker Gilman responded well to treatment with one does of Remeron 15 mg  sol-tabwithout adverse effects. Pt demonstrated improvement without reported or observed adverse effects to the point of stability appropriate for outpatient management. Pertinent labs include: CBC, CMP  UDS + benzodiazepine  for which outpatient follow-up is necessary for lab recheck as mentioned below. Reviewed CBC, CMP, BAL, and UDS+; all unremarkable aside from noted exceptions.   Physical Findings: AIMS: Facial and Oral Movements Muscles of Facial Expression: None, normal Lips and Perioral Area: None, normal Jaw: None, normal Tongue: None, normal,Extremity Movements Upper (arms, wrists, hands, fingers): None, normal Lower (legs, knees, ankles, toes): None, normal, Trunk Movements Neck, shoulders, hips: None, normal, Overall Severity Severity of abnormal movements (highest score from questions above): None, normal Incapacitation due to abnormal movements: None, normal Patient's awareness of abnormal movements (rate only patient's report): No Awareness, Dental Status Current problems with teeth and/or dentures?: No Does patient usually wear dentures?: No  CIWA:  CIWA-Ar Total: 0 COWS:  COWS Total Score: 0  Musculoskeletal: Strength & Muscle Tone: within normal limits Gait & Station: normal Patient leans: N/A  Psychiatric Specialty Exam: See SRa by MD  Physical Exam  ROS  Blood pressure (!) 93/55, pulse 67, temperature 99.2 F (37.3 C), temperature source Oral, resp. rate 16, height 4' 10"  (1.473 m), weight 60.8 kg (134 lb), unknown if currently breastfeeding.Body mass index is 28.01 kg/m.    Have you used any form of tobacco in the last 30 days? (Cigarettes, Smokeless Tobacco, Cigars, and/or Pipes): No  Has this patient used any form of tobacco in the last 30 days? (Cigarettes, Smokeless  Tobacco, Cigars, and/or Pipes) Yes, No  Blood Alcohol level:  No results found for: [facility]  Metabolic Disorder Labs:  Lab Results  Component Value Date   HGBA1C 4.8 01/14/2018   No results found for: PROLACTIN No results found for: CHOL, TRIG, HDL, CHOLHDL, VLDL, LDLCALC  See Psychiatric Specialty Exam and Suicide Risk Assessment completed by Attending Physician prior to discharge.  Discharge  destination:  Home  Is patient on multiple antipsychotic therapies at discharge:  No   Has Patient had three or more failed trials of antipsychotic monotherapy by history:  No  Recommended Plan for Multiple Antipsychotic Therapies: NA  Discharge Instructions    Diet - low sodium heart healthy   Complete by:  As directed    Diet - low sodium heart healthy   Complete by:  As directed    Discharge instructions   Complete by:  As directed    Take all medications as prescribed. Keep all follow-up appointments as scheduled.  Do not consume alcohol or use illegal drugs while on prescription medications. Report any adverse effects from your medications to your primary care provider promptly.  In the event of recurrent symptoms or worsening symptoms, call 911, a crisis hotline, or go to the nearest emergency department for evaluation.   Discharge instructions   Complete by:  As directed    Take all medications as prescribed. Keep all follow-up appointments as scheduled.  Do not consume alcohol or use illegal drugs while on prescription medications. Report any adverse effects from your medications to your primary care provider promptly.  In the event of recurrent symptoms or worsening symptoms, call 911, a crisis hotline, or go to the nearest emergency department for evaluation.   Increase activity slowly   Complete by:  As directed    Increase activity slowly   Complete by:  As directed      Allergies as of 07/09/2018   No Known Allergies     Medication List    STOP taking these  medications   ibuprofen 800 MG tablet Commonly known as:  ADVIL,MOTRIN   oxyCODONE-acetaminophen 5-325 MG tablet Commonly known as:  PERCOCET/ROXICET   PRENATE PIXIE 10-0.6-0.4-200 MG Caps     TAKE these medications     Indication  hydrOXYzine 25 MG tablet Commonly known as:  ATARAX/VISTARIL Take 1 tablet (25 mg total) by mouth every 6 (six) hours as needed for anxiety.  Indication:  Feeling Anxious, Pain      Follow-up Information    None Follow up.   Why:  Patient refused all referrals.           Follow-up recommendations:  Activity:  as tolerated Diet:  heart healthy   Comments:  Take all medications as prescribed. Keep all follow-up appointments as scheduled.  Do not consume alcohol or use illegal drugs while on prescription medications. Report any adverse effects from your medications to your primary care provider promptly.  In the event of recurrent symptoms or worsening symptoms, call 911, a crisis hotline, or go to the nearest emergency department for evaluation.  Signed: Derrill Center, NP 07/09/2018, 2:43 PM

## 2018-07-09 NOTE — [facility] Group Notes (Signed)
LCSW Group Therapy Note  07/09/2018   10:00--11:00am   Type of Therapy and Topic:  Group Therapy: Anger Cues and Responses  Participation Level:  None   Description of Group:   In this group, patients learned how to recognize the physical, cognitive, emotional, and behavioral responses they have to anger-provoking situations.  They identified a recent time they became angry and how they reacted.  They analyzed how their reaction was possibly beneficial and how it was possibly unhelpful.  The group discussed a variety of healthier coping skills that could help with such a situation in the future.  Deep breathing was practiced briefly.  Therapeutic Goals: 1. Patients will remember their last incident of anger and how they felt emotionally and physically, what their thoughts were at the time, and how they behaved. 2. Patients will identify how their behavior at that time worked for them, as well as how it worked against them. 3. Patients will explore possible new behaviors to use in future anger situations. 4. Patients will learn that anger itself is normal and cannot be eliminated, and that healthier reactions can assist with resolving conflict rather than worsening situations.  Summary of Patient Progress:  The patient came in at the end and did not participate.  Therapeutic Modalities:   Cognitive Behavioral Therapy  Evorn Gongonnie D. Zurii Hewes LCSW

## 2018-07-09 NOTE — Progress Notes (Addendum)
  [facility]  Will you be returning to the same living situation after discharge:  Yes At discharge, do you have transportation home?: Yes Do you have the ability to pay for your medications: N/A patient is not on medications.   Release of information consent forms completed and in the chart;  Patient's signature needed at discharge.  Patient to Follow up at: Follow-up Information    None Follow up.   Why:  Patient refused all referrals.         [facility] phone number and program description for group and individual therapy.   Next level of care provider has access to [facility] Link:no  Safety Planning and Suicide Prevention discussed: Yes with patient. Pamphlet provided to patient and husband at actual discharge.   Have you used any form of tobacco in the last 30 days? (Cigarettes, Smokeless Tobacco, Cigars, and/or Pipes): No  Has patient been referred to the Quitline?: N/A  Patient has been referred for addiction treatment: N/A Shellia CleverlyStephanie N Dondi Burandt, LCSW 07/09/2018, 2:14 PM

## 2018-07-10 NOTE — H&P (Signed)
Psychiatric Admission Assessment Adult  Patient Identification: Maria Riley MRN:  [id number] Date of Evaluation:  07/10/2018 Chief Complaint:  mdd single episode severe  Principal Diagnosis: MDD (major depressive disorder), recurrent episode, severe ([place]) Diagnosis:   Patient Active Problem List   Diagnosis Date Noted  . MDD (major depressive disorder), recurrent episode, severe ([place]) [F33.2] 07/08/2018  . DIC (disseminated intravascular coagulation) ([place]) [D65] 06/13/2018  . Antepartum placental abruption [O45.90] 06/13/2018  . PPH (postpartum hemorrhage) [O72.1] 06/13/2018  . Language barrier [Z78.9] 06/13/2018  . Supervision of high risk pregnancy, antepartum [O09.90] 11/23/2016  . History of preterm delivery [Z87.51] 11/23/2016   History of Present Illness: Patient is seen and examined.  Patient is a 27 year old Hispanic female who presented for voluntary admission to the behavioral health hospital.  The patient is a Spanish-speaking female, and translator was present for direct patient contact and discussion.  Review of her records was also made.  The patient had recently delivered a child that suffered hypoxic ischemic encephalopathy.  The patient lived for 12 days on a ventilator.  Patient was in the hospital with the baby during part of that time.  She was discharged home.  The patient admitted that she had had depressive symptoms, crying spells.  She went to the women's hospital to get some resources for psychological treatment and therapy.  The assessment of the patient was that she was depressive, suicidal, not eating and was hitting herself in the face.  She was transferred to the behavioral health hospital.  In the room at the time of discussion with the patient was the patient's husband, nursing staff, and translator via telemedicine.  The patient denied any suicidal ideation.  She denied any previous history of self-harm.  She was clearly grief stricken.  She was requesting  resources in the community that would help her cope with the death of her child.  Her main issue was to get home to the 2 children that she still has alive.  Her husband stated that he was comfortable with the patient coming home, did not believe her to be suicidal, and felt like the only thing she needed was some counseling. Associated Signs/Symptoms: Depression Symptoms:  depressed mood, insomnia, fatigue, anxiety, loss of energy/fatigue, disturbed sleep, (Hypo) Manic Symptoms:  Impulsivity, Anxiety Symptoms:  Excessive Worry, Psychotic Symptoms:  Denied PTSD Symptoms: Had a traumatic exposure:  Death of a child Total Time spent with patient: 1 hour  Past Psychiatric History: Negative past psychiatric history  Is the patient at risk to self? No.  Has the patient been a risk to self in the past 6 months? No.  Has the patient been a risk to self within the distant past? No.  Is the patient a risk to others? No.  Has the patient been a risk to others in the past 6 months? No.  Has the patient been a risk to others within the distant past? No.   Prior Inpatient Therapy:   Prior Outpatient Therapy:    Alcohol Screening: 1. How often do you have a drink containing alcohol?: Never 2. How many drinks containing alcohol do you have on a typical day when you are drinking?: 1 or 2(doesn't drink alcohol) 3. How often do you have six or more drinks on one occasion?: Never AUDIT-C Score: 0 9. Have you or someone else been injured as a result of your drinking?: No 10. Has a relative or friend or a doctor or another health worker been concerned about your drinking  or suggested you cut down?: No Alcohol Use Disorder Identification Test Final Score (AUDIT): 0 Intervention/Follow-up: AUDIT Score <7 follow-up not indicated Substance Abuse History in the last 12 months:  No. Consequences of Substance Abuse: Negative Previous Psychotropic Medications: No  Psychological Evaluations: No  Past  Medical History:  Past Medical History:  Diagnosis Date  . Medical history non-contributory     Past Surgical History:  Procedure Laterality Date  . CESAREAN SECTION N/A 06/13/2018   Procedure: CESAREAN SECTION;  Surgeon: Aletha Halim, MD;  Location: [facility] [place];  Service: Obstetrics;  Laterality: N/A;  . MOUTH SURGERY    . WISDOM TOOTH EXTRACTION Bilateral    Family History:  Family History  Problem Relation Age of Onset  . Alcohol abuse Neg Hx   . Arthritis Neg Hx   . Asthma Neg Hx   . Birth defects Neg Hx   . Cancer Neg Hx   . COPD Neg Hx   . Depression Neg Hx   . Diabetes Neg Hx   . Drug abuse Neg Hx   . Early death Neg Hx   . Hearing loss Neg Hx   . Heart disease Neg Hx   . Hyperlipidemia Neg Hx   . Hypertension Neg Hx   . Kidney disease Neg Hx   . Learning disabilities Neg Hx   . Mental illness Neg Hx   . Mental retardation Neg Hx   . Miscarriages / Stillbirths Neg Hx   . Stroke Neg Hx   . Vision loss Neg Hx   . Varicose Veins Neg Hx    Family Psychiatric  History: Denied any family psychiatric history or any suicide in her family members. Tobacco Screening: Have you used any form of tobacco in the last 30 days? (Cigarettes, Smokeless Tobacco, Cigars, and/or Pipes): No Social History:  Social History   Substance and Sexual Activity  Alcohol Use No     Social History   Substance and Sexual Activity  Drug Use No    Additional Social History:      Pain Medications: denies Prescriptions: denies Over the Counter: denies History of alcohol / drug use?: No history of alcohol / drug abuse Longest period of sobriety (when/how long): not applicable                    Allergies:  No Known Allergies Lab Results:  Results for orders placed or performed during the hospital encounter of 07/08/18 (from the past 48 hour(s))  CBC     Status: Abnormal   Collection Time: 07/08/18  4:40 PM  Result Value Ref Range   WBC 4.7 4.0 - 10.5 K/uL   RBC  4.00 3.87 - 5.11 MIL/uL   Hemoglobin 11.9 (L) 12.0 - 15.0 g/dL   HCT 35.2 (L) 36.0 - 46.0 %   MCV 88.0 78.0 - 100.0 fL   MCH 29.8 26.0 - 34.0 pg   MCHC 33.8 30.0 - 36.0 g/dL   RDW 13.3 11.5 - 15.5 %   Platelets 332 150 - 400 K/uL    Comment: Performed at [facility], [address] [place], [place] [address]  Comprehensive metabolic panel     Status: Abnormal   Collection Time: 07/08/18  4:40 PM  Result Value Ref Range   Sodium 137 135 - 145 mmol/L   Potassium 4.0 3.5 - 5.1 mmol/L   Chloride 105 98 - 111 mmol/L   CO2 22 22 - 32 mmol/L   Glucose, Bld 91 70 -  99 mg/dL   BUN 19 6 - 20 mg/dL   Creatinine, Ser 0.86 0.44 - 1.00 mg/dL   Calcium 9.2 8.9 - 10.3 mg/dL   Total Protein 7.4 6.5 - 8.1 g/dL   Albumin 3.9 3.5 - 5.0 g/dL   AST 20 15 - 41 U/L   ALT 18 0 - 44 U/L   Alkaline Phosphatase 91 38 - 126 U/L   Total Bilirubin 0.2 (L) 0.3 - 1.2 mg/dL   GFR calc non Af Amer >60 >60 mL/min   GFR calc Af Amer >60 >60 mL/min    Comment: (NOTE) The eGFR has been calculated using the CKD EPI equation. This calculation has not been validated in all clinical situations. eGFR's persistently <60 mL/min signify possible Chronic Kidney Disease.    Anion gap 10 5 - 15    Comment: Performed at [facility], [address], [place], [place] [address]  Urine rapid drug screen (hosp performed)     Status: Abnormal   Collection Time: 07/08/18  5:29 PM  Result Value Ref Range   Opiates NONE DETECTED NONE DETECTED   Cocaine NONE DETECTED NONE DETECTED   Benzodiazepines POSITIVE (A) NONE DETECTED   Amphetamines NONE DETECTED NONE DETECTED   Tetrahydrocannabinol NONE DETECTED NONE DETECTED   Barbiturates NONE DETECTED NONE DETECTED    Comment: (NOTE) DRUG SCREEN FOR MEDICAL PURPOSES ONLY.  IF CONFIRMATION IS NEEDED FOR ANY PURPOSE, NOTIFY LAB WITHIN 5 DAYS. LOWEST DETECTABLE LIMITS FOR URINE DRUG SCREEN Drug Class                     Cutoff (ng/mL) Amphetamine and metabolites     1000 Barbiturate and metabolites    200 Benzodiazepine                 604 Tricyclics and metabolites     300 Opiates and metabolites        300 Cocaine and metabolites        300 THC                            50 Performed at [facility], [address], [place], [place] [address]     Blood Alcohol level:  No results found for: [facility]  Metabolic Disorder Labs:  Lab Results  Component Value Date   HGBA1C 4.8 01/14/2018   No results found for: PROLACTIN No results found for: CHOL, TRIG, HDL, CHOLHDL, VLDL, LDLCALC  Current Medications: No current facility-administered medications for this encounter.    Current Outpatient Medications  Medication Sig Dispense Refill  . hydrOXYzine (ATARAX/VISTARIL) 25 MG tablet Take 1 tablet (25 mg total) by mouth every 6 (six) hours as needed for anxiety. 30 tablet 0   PTA Medications: No medications prior to admission.    Musculoskeletal: Strength & Muscle Tone: within normal limits Gait & Station: normal Patient leans: N/A  Psychiatric Specialty Exam: Physical Exam  Nursing note and vitals reviewed. Constitutional: She is oriented to person, place, and time. She appears well-developed and well-nourished.  HENT:  Head: Normocephalic and atraumatic.  Respiratory: Effort normal.  Neurological: She is alert and oriented to person, place, and time.    ROS  Blood pressure (!) 93/55, pulse 67, temperature 99.2 F (37.3 C), temperature source Oral, resp. rate 16, height _0  (1.473 m), weight 60.8 kg (134 lb), unknown if currently breastfeeding.Body mass index is 28.01 kg/m.  General Appearance: Casual  Eye Contact:  Fair  Speech:  Normal Rate  Volume:  Normal  Mood:  Anxious and Depressed  Affect:  Congruent  Thought Process:  Coherent  Orientation:  Full (Time, Place, and Person)  Thought Content:  Logical  Suicidal Thoughts:  No  Homicidal Thoughts:  No  Memory:  Immediate;   Fair Recent;   Fair Remote;   Fair   Judgement:  Intact  Insight:  Fair  Psychomotor Activity:  Decreased  Concentration:  Concentration: Fair and Attention Span: Fair  Recall:  [employer] of Knowledge:  Fair  Language:  Fair  Akathisia:  Negative  Handed:  Right  AIMS (if indicated):     Assets:  Desire for Improvement Housing Physical Health Resilience Social Support  ADL's:  Intact  Cognition:  WNL  Sleep:  Number of Hours: 5.25    Treatment Plan Summary: Daily contact with patient to assess and evaluate symptoms and progress in treatment, Medication management and Plan : Patient is seen and examined.  Patient is a 27 year old female with a probable acute stress reaction/grief secondary to the death of her child.  She denies suicidal ideation.  Her husband corroborates this.  She would prefer to leave the hospital, and have resources as an outpatient for counseling.  I discussed the case with nursing as well as social work.  Social work will attempt to get resources including hospice and other community resources for her treatment.  We will proceed to see if were able to discharge her.  If everything is in place, and we continue to feel safe with the patient, we will plan on getting her to an area where she can receive treatment.  Unfortunately, we are unable to obtain translators for her during groups, and so unfortunately she would get basically no counseling while she is in the hospital.  Observation Level/Precautions:  15 minute checks  Laboratory:  Chemistry Profile  Psychotherapy:    Medications:    Consultations:    Discharge Concerns:    Estimated LOS:  Other:     Physician Treatment Plan for Primary Diagnosis: MDD (major depressive disorder), recurrent episode, severe ([place]) Long Term Goal(s): Improvement in symptoms so as ready for discharge  Short Term Goals: Ability to identify changes in lifestyle to reduce recurrence of condition will improve, Ability to verbalize feelings will improve, Ability to  disclose and discuss suicidal ideas, Ability to demonstrate self-control will improve and Ability to identify and develop effective coping behaviors will improve  Physician Treatment Plan for Secondary Diagnosis: Principal Problem:   MDD (major depressive disorder), recurrent episode, severe ([place])  Long Term Goal(s): Improvement in symptoms so as ready for discharge  Short Term Goals: Ability to identify changes in lifestyle to reduce recurrence of condition will improve, Ability to verbalize feelings will improve, Ability to disclose and discuss suicidal ideas, Ability to demonstrate self-control will improve and Ability to identify and develop effective coping behaviors will improve  I certify that inpatient services furnished can reasonably be expected to improve the patient's condition.    Sharma Covert, MD 8/4/201912:45 PM

## 2018-07-18 ENCOUNTER — Encounter: Payer: Self-pay | Admitting: Obstetrics

## 2018-07-18 ENCOUNTER — Ambulatory Visit (INDEPENDENT_AMBULATORY_CARE_PROVIDER_SITE_OTHER): Payer: Medicaid Other | Admitting: Obstetrics

## 2018-07-18 DIAGNOSIS — Z3042 Encounter for surveillance of injectable contraceptive: Secondary | ICD-10-CM | POA: Diagnosis not present

## 2018-07-18 DIAGNOSIS — Z3009 Encounter for other general counseling and advice on contraception: Secondary | ICD-10-CM

## 2018-07-18 DIAGNOSIS — Z3202 Encounter for pregnancy test, result negative: Secondary | ICD-10-CM | POA: Diagnosis not present

## 2018-07-18 DIAGNOSIS — G8918 Other acute postprocedural pain: Secondary | ICD-10-CM

## 2018-07-18 DIAGNOSIS — Z30013 Encounter for initial prescription of injectable contraceptive: Secondary | ICD-10-CM

## 2018-07-18 DIAGNOSIS — F53 Postpartum depression: Secondary | ICD-10-CM

## 2018-07-18 DIAGNOSIS — Z1389 Encounter for screening for other disorder: Secondary | ICD-10-CM

## 2018-07-18 DIAGNOSIS — Z8759 Personal history of other complications of pregnancy, childbirth and the puerperium: Secondary | ICD-10-CM

## 2018-07-18 DIAGNOSIS — O99345 Other mental disorders complicating the puerperium: Secondary | ICD-10-CM

## 2018-07-18 LAB — POCT URINE PREGNANCY: Preg Test, Ur: NEGATIVE

## 2018-07-18 MED ORDER — MEDROXYPROGESTERONE ACETATE 150 MG/ML IM SUSP
150.0000 mg | Freq: Once | INTRAMUSCULAR | Status: AC
  Administered 2018-07-18: 150 mg via INTRAMUSCULAR

## 2018-07-18 MED ORDER — IBUPROFEN 800 MG PO TABS
800.0000 mg | ORAL_TABLET | Freq: Three times a day (TID) | ORAL | 5 refills | Status: DC | PRN
Start: 2018-07-18 — End: 2018-09-27

## 2018-07-18 MED ORDER — MEDROXYPROGESTERONE ACETATE 150 MG/ML IM SUSP: 150.0000 mg | INTRAMUSCULAR | 4 refills | Status: DC

## 2018-07-18 MED ORDER — ESCITALOPRAM OXALATE 10 MG PO TABS: 10.0000 mg | ORAL_TABLET | Freq: Every day | ORAL | 5 refills | Status: DC

## 2018-07-18 NOTE — Progress Notes (Signed)
Post Partum Exam  Blondell RevealLinda D Wellen is a 27 y.o. [phone] female who presents for a postpartum visit. She is 4 weeks postpartum following a low cervical transverse Cesarean section. I have fully reviewed the prenatal and intrapartum course. The delivery was at 35 gestational weeks.  Anesthesia: spinal. Postpartum course has been not good. Baby's course has been FETAL DEMISE. Baby is feeding by n/a. Bleeding spotting.. Bowel function is constipated.. Bladder function is normal. Patient is not sexually active. Contraception method is Depo. Postpartum depression screening:EPDS: 18  The following portions of the patient's history were reviewed and updated as appropriate: allergies, current medications, past family history, past medical history, past social history, past surgical history and problem list. Last pap smear done 2019 and was Normal  Review of Systems A comprehensive review of systems was negative except for: Behavioral/Psych: positive for depression    Objective:  unknown if currently breastfeeding.  General:  alert and no distress   Breasts:  inspection negative, no nipple discharge or bleeding, no masses or nodularity palpable  Lungs: clear to auscultation bilaterally  Heart:  regular rate and rhythm, S1, S2 normal, no murmur, click, rub or gallop  Abdomen: normal findings: no masses palpable, soft, non-tender and incision is clean, dry and intact   Assessment:   1. Postpartum care following cesarean delivery  2. History of IUFD  3. Postpartum depression Rx: - escitalopram (LEXAPRO) 10 MG tablet; Take 1 tablet (10 mg total) by mouth daily.  Dispense: 30 tablet; Refill: 5  4. Encounter for other general counseling and advice on contraception - wants Depo Provera  5. Encounter for initial prescription of injectable contraceptive Rx: - POCT urine pregnancy - medroxyPROGESTERone (DEPO-PROVERA) 150 MG/ML injection; Inject 1 mL (150 mg total) into the muscle every 3 (three)  months.  Dispense: 1 mL; Refill: 4 - medroxyPROGESTERone (DEPO-PROVERA) injection 150 mg  6. Postoperative pain Rx: - ibuprofen (ADVIL,MOTRIN) 800 MG tablet; Take 1 tablet (800 mg total) by mouth every 8 (eight) hours as needed.  Dispense: 30 tablet; Refill: 5    Plan:   1. Contraception: Depo-Provera injections 2. Depo Provera Rx 3. Follow up in: 2 weeks or as needed.    Brock BadHARLES A. India Jolin MD 07-18-2018

## 2018-08-01 ENCOUNTER — Encounter: Payer: Self-pay | Admitting: Obstetrics

## 2018-08-01 ENCOUNTER — Ambulatory Visit (INDEPENDENT_AMBULATORY_CARE_PROVIDER_SITE_OTHER): Payer: Medicaid Other | Admitting: Obstetrics

## 2018-08-01 DIAGNOSIS — O99345 Other mental disorders complicating the puerperium: Secondary | ICD-10-CM

## 2018-08-01 DIAGNOSIS — E509 Vitamin A deficiency, unspecified: Secondary | ICD-10-CM

## 2018-08-01 DIAGNOSIS — Z8759 Personal history of other complications of pregnancy, childbirth and the puerperium: Secondary | ICD-10-CM

## 2018-08-01 DIAGNOSIS — F53 Postpartum depression: Secondary | ICD-10-CM

## 2018-08-01 MED ORDER — VITAFOL ULTRA 29-0.6-0.4-200 MG PO CAPS: 1.0000 | ORAL_CAPSULE | Freq: Every day | ORAL | 11 refills | Status: DC

## 2018-08-01 NOTE — Progress Notes (Signed)
Patient ID: Maria Riley, female   DOB: 04-28-1991, 27 y.o.   MRN: [id number]  Chief Complaint  Patient presents with  . Follow-up    HPI Maria RevealLinda D Riley is a 27 y.o. female.  History of postpartum depression.  Had a C/S ~ 6 weeks ago.  Started on Lexapro 2 weeks ago.  States that she is feeling a little less depressed. HPI  Past Medical History:  Diagnosis Date  . Medical history non-contributory     Past Surgical History:  Procedure Laterality Date  . CESAREAN SECTION N/A 06/13/2018   Procedure: CESAREAN SECTION;  Surgeon: Ariton BingPickens, Charlie, MD;  [facility];  Service: Obstetrics;  Laterality: N/A;  . MOUTH SURGERY    . WISDOM TOOTH EXTRACTION Bilateral     Family History  Problem Relation Age of Onset  . Alcohol abuse Neg Hx   . Arthritis Neg Hx   . Asthma Neg Hx   . Birth defects Neg Hx   . Cancer Neg Hx   . COPD Neg Hx   . Depression Neg Hx   . Diabetes Neg Hx   . Drug abuse Neg Hx   . Early death Neg Hx   . Hearing loss Neg Hx   . Heart disease Neg Hx   . Hyperlipidemia Neg Hx   . Hypertension Neg Hx   . Kidney disease Neg Hx   . Learning disabilities Neg Hx   . Mental illness Neg Hx   . Mental retardation Neg Hx   . Miscarriages / Stillbirths Neg Hx   . Stroke Neg Hx   . Vision loss Neg Hx   . Varicose Veins Neg Hx     Social History Social History   Tobacco Use  . Smoking status: Never Smoker  . Smokeless tobacco: Never Used  Substance Use Topics  . Alcohol use: No  . Drug use: No    No Known Allergies  Current Outpatient Medications  Medication Sig Dispense Refill  . escitalopram (LEXAPRO) 10 MG tablet Take 1 tablet (10 mg total) by mouth daily. 30 tablet 5  . medroxyPROGESTERone (DEPO-PROVERA) 150 MG/ML injection Inject 1 mL (150 mg total) into the muscle every 3 (three) months. 1 mL 4  . hydrOXYzine (ATARAX/VISTARIL) 25 MG tablet Take 1 tablet (25 mg total) by mouth every 6 (six) hours as needed for anxiety. (Patient not  taking: Reported on 07/18/2018) 30 tablet 0  . ibuprofen (ADVIL,MOTRIN) 200 MG tablet Take 200 mg by mouth every 6 (six) hours as needed.    Marland Kitchen. ibuprofen (ADVIL,MOTRIN) 800 MG tablet Take 1 tablet (800 mg total) by mouth every 8 (eight) hours as needed. (Patient not taking: Reported on 08/01/2018) 30 tablet 5  . Prenat-Fe Poly-Methfol-FA-DHA (VITAFOL ULTRA) 29-0.6-0.4-200 MG CAPS Take 1 capsule by mouth at bedtime. 30 capsule 11   No current facility-administered medications for this visit.     Review of Systems Review of Systems Constitutional: negative for fatigue and weight loss Respiratory: negative for cough and wheezing Cardiovascular: negative for chest pain, fatigue and palpitations Gastrointestinal: negative for abdominal pain and change in bowel habits Genitourinary:negative Integument/breast: negative for nipple discharge Musculoskeletal:negative for myalgias Neurological: negative for gait problems and tremors Behavioral/Psych: POSITIVE for depression Endocrine: negative for temperature intolerance      Blood pressure 101/67, pulse 73, weight 137 lb 3.2 oz (62.2 kg), not currently breastfeeding.  Physical Exam Physical Exam General:   alert  Skin:   no rash or abnormalities  Lungs:  clear to auscultation bilaterally  Heart:   regular rate and rhythm, S1, S2 normal, no murmur, click, rub or gallop  Breasts:   normal without suspicious masses, skin or nipple changes or axillary nodes  Abdomen:  normal findings: no organomegaly, soft, non-tender and no hernia   50% of 15 min visit spent on counseling and coordination of care.   Data Reviewed Medication  Assessment     1. Postpartum care following cesarean delivery  2. History of IUFD  3. Postpartum depression - stable, on Lexapro  4. Vitamin A deficiency Rx: - Prenat-Fe Poly-Methfol-FA-DHA (VITAFOL ULTRA) 29-0.6-0.4-200 MG CAPS; Take 1 capsule by mouth at bedtime.  Dispense: 30 capsule; Refill: 11    Plan     Continue Lexapro Follow up in 4 weeks  No orders of the defined types were placed in this encounter.  Meds ordered this encounter  Medications  . Prenat-Fe Poly-Methfol-FA-DHA (VITAFOL ULTRA) 29-0.6-0.4-200 MG CAPS    Sig: Take 1 capsule by mouth at bedtime.    Dispense:  30 capsule    Refill:  11     Brock Bad MD 08-01-2018

## 2018-08-01 NOTE — Progress Notes (Signed)
Patient is in the office for pp follow up. PP score 13. Patient reports that she feels a little better since taking the Lexapro.

## 2018-08-12 ENCOUNTER — Telehealth: Payer: Self-pay

## 2018-08-12 NOTE — Telephone Encounter (Signed)
S/w Raynelle Fanning, RN from [facility] [phone]). Raynelle Fanning states that pt currently refuses to take Lexapro and wants to use prayer to get through the loss. Raynelle Fanning states that she will get pt set up with a program that will periodically come to her house and check on her, informed provider.

## 2018-08-15 ENCOUNTER — Telehealth: Payer: Self-pay | Admitting: Obstetrics

## 2018-08-15 NOTE — Telephone Encounter (Signed)
Patient has postpartum depression.  Called to see how she is doing.  She states that she is feeling a little less depressed.  She is taking Lexapro.  Next appointment is 08-29-2018.

## 2018-08-29 ENCOUNTER — Ambulatory Visit (INDEPENDENT_AMBULATORY_CARE_PROVIDER_SITE_OTHER): Payer: Medicaid Other | Admitting: Obstetrics

## 2018-08-29 ENCOUNTER — Encounter: Payer: Self-pay | Admitting: Obstetrics

## 2018-08-29 DIAGNOSIS — Z1389 Encounter for screening for other disorder: Secondary | ICD-10-CM | POA: Diagnosis not present

## 2018-08-29 DIAGNOSIS — O99345 Other mental disorders complicating the puerperium: Secondary | ICD-10-CM

## 2018-08-29 DIAGNOSIS — Z8759 Personal history of other complications of pregnancy, childbirth and the puerperium: Secondary | ICD-10-CM

## 2018-08-29 DIAGNOSIS — F53 Postpartum depression: Secondary | ICD-10-CM

## 2018-08-29 NOTE — Progress Notes (Signed)
Subjective:     Maria Riley is a 27 y.o. female who presents for a postpartum visit. She is 10 weeks postpartum following a low cervical transverse Cesarean section. I have fully reviewed the prenatal and intrapartum course. The delivery was at 35 gestational weeks. Outcome: primary cesarean section, low transverse incision. Anesthesia: spinal. Postpartum course has been complicated by depression.  Bleeding no bleeding. Bowel function is normal. Bladder function is normal. Patient is not sexually active. Contraception method is Depo-Provera injections. Postpartum depression screening: positive.  Tobacco, alcohol and substance abuse history reviewed.  Adult immunizations reviewed including TDAP, rubella and varicella.  The following portions of the patient's history were reviewed and updated as appropriate: allergies, current medications, past family history, past medical history, past social history, past surgical history and problem list.  Review of Systems A comprehensive review of systems was negative except for: Behavioral/Psych: positive for depression   Objective:    BP 104/67   Pulse 66   Wt 135 lb 6.4 oz (61.4 kg)   BMI 28.30 kg/m   General:  alert and no distress   Breasts:  inspection negative, no nipple discharge or bleeding, no masses or nodularity palpable  Lungs: clear to auscultation bilaterally  Heart:  regular rate and rhythm, S1, S2 normal, no murmur, click, rub or gallop  Abdomen: soft, non-tender; bowel sounds normal; no masses,  no organomegaly and incision is clean, dry and intact    50% of 15 min visit spent on counseling and coordination of care.   Assessment:     1. Postpartum care following cesarean delivery  2. History of IUFD  3. Postpartum depression - slowly improving.  Taking Lexalro - [facility] to [facility]  Plan:    1. Contraception: Depo-Provera injections 2. Continue Lexapro 3. Follow up in: 4 weeks or as  needed.    Brock BadHARLES A. HARPER MD 08-29-2018

## 2018-09-26 ENCOUNTER — Ambulatory Visit: Payer: Medicaid Other | Admitting: Obstetrics

## 2018-09-27 ENCOUNTER — Encounter: Payer: Self-pay | Admitting: Obstetrics

## 2018-09-27 ENCOUNTER — Ambulatory Visit (INDEPENDENT_AMBULATORY_CARE_PROVIDER_SITE_OTHER): Payer: Medicaid Other | Admitting: Obstetrics

## 2018-09-27 VITALS — BP 97/61 | HR 96 | Resp 18 | Ht 59.0 in | Wt 135.8 lb

## 2018-09-27 DIAGNOSIS — Z3042 Encounter for surveillance of injectable contraceptive: Secondary | ICD-10-CM

## 2018-09-27 DIAGNOSIS — F53 Postpartum depression: Secondary | ICD-10-CM | POA: Diagnosis not present

## 2018-09-27 DIAGNOSIS — O99345 Other mental disorders complicating the puerperium: Principal | ICD-10-CM

## 2018-09-27 NOTE — Progress Notes (Signed)
Patient reports that she stopped taking the Lexapro about 2-3 weeks ago and feels much being off the medication.

## 2018-09-27 NOTE — Progress Notes (Signed)
Patient ID: Maria Riley, female   DOB: 1991-11-21, 27 y.o.   MRN: [id number]  Chief Complaint  Patient presents with  . Follow-up    Depression    HPI Maria Riley is a 27 y.o. female.  Stopped taking Lexapro 2-3 weeks ago, and feels much better off medicine. HPI  Past Medical History:  Diagnosis Date  . Medical history non-contributory     Past Surgical History:  Procedure Laterality Date  . CESAREAN SECTION N/A 06/13/2018   Procedure: CESAREAN SECTION;  Surgeon: Seville Bing, MD;  Location: [facility] BIRTHING SUITES;  Service: Obstetrics;  Laterality: N/A;  . MOUTH SURGERY    . WISDOM TOOTH EXTRACTION Bilateral     Family History  Problem Relation Age of Onset  . Alcohol abuse Neg Hx   . Arthritis Neg Hx   . Asthma Neg Hx   . Birth defects Neg Hx   . Cancer Neg Hx   . COPD Neg Hx   . Depression Neg Hx   . Diabetes Neg Hx   . Drug abuse Neg Hx   . Early death Neg Hx   . Hearing loss Neg Hx   . Heart disease Neg Hx   . Hyperlipidemia Neg Hx   . Hypertension Neg Hx   . Kidney disease Neg Hx   . Learning disabilities Neg Hx   . Mental illness Neg Hx   . Mental retardation Neg Hx   . Miscarriages / Stillbirths Neg Hx   . Stroke Neg Hx   . Vision loss Neg Hx   . Varicose Veins Neg Hx     Social History Social History   Tobacco Use  . Smoking status: Never Smoker  . Smokeless tobacco: Never Used  Substance Use Topics  . Alcohol use: No  . Drug use: No    No Known Allergies  Current Outpatient Medications  Medication Sig Dispense Refill  . medroxyPROGESTERone (DEPO-PROVERA) 150 MG/ML injection Inject 1 mL (150 mg total) into the muscle every 3 (three) months. 1 mL 4  . Prenat-Fe Poly-Methfol-FA-DHA (VITAFOL ULTRA) 29-0.6-0.4-200 MG CAPS Take 1 capsule by mouth at bedtime. 30 capsule 11  . escitalopram (LEXAPRO) 10 MG tablet Take 1 tablet (10 mg total) by mouth daily. (Patient not taking: Reported on 09/27/2018) 30 tablet 5   No current  facility-administered medications for this visit.     Review of Systems Review of Systems Constitutional: negative for fatigue and weight loss Respiratory: negative for cough and wheezing Cardiovascular: negative for chest pain, fatigue and palpitations Gastrointestinal: negative for abdominal pain and change in bowel habits Genitourinary:negative Integument/breast: negative for nipple discharge Musculoskeletal:negative for myalgias Neurological: negative for gait problems and tremors Behavioral/Psych: negative for abusive relationship, depression Endocrine: negative for temperature intolerance      Blood pressure 97/61, pulse 96, resp. rate 18, height 4\' 11"  (1.499 m), weight 135 lb 12.8 oz (61.6 kg), not currently breastfeeding.  Physical Exam Physical Exam:  Deferred  >50% of 15 min visit spent on counseling and coordination of care.   Data Reviewed Previous office notes and labs  Assessment     1. Postpartum depression - slowly improving, but still has sadness when she sees a baby or pregnant woman.  I reassured her that this is normal. - discontinue Lexapro  2. Encounter for surveillance of injectable contraceptive - doing well    Plan    Follow up in 6 months for Annual  No orders of the defined types were placed in  this encounter.  No orders of the defined types were placed in this encounter.   Brock Bad MD 09-27-2018

## 2018-10-26 ENCOUNTER — Telehealth (HOSPITAL_COMMUNITY): Payer: Self-pay | Admitting: Psychiatry

## 2018-10-26 ENCOUNTER — Ambulatory Visit: Payer: Medicaid Other | Admitting: Obstetrics

## 2018-11-14 ENCOUNTER — Emergency Department (HOSPITAL_COMMUNITY)
Admission: EM | Admit: 2018-11-14 | Discharge: 2018-11-14 | Discharge status: Against Medical Advice | Payer: Medicaid Other | Attending: Emergency Medicine | Admitting: Emergency Medicine

## 2018-11-14 DIAGNOSIS — Z5321 Procedure and treatment not carried out due to patient leaving prior to being seen by health care provider: Secondary | ICD-10-CM | POA: Insufficient documentation

## 2018-11-14 DIAGNOSIS — N939 Abnormal uterine and vaginal bleeding, unspecified: Secondary | ICD-10-CM | POA: Diagnosis present

## 2018-11-14 NOTE — ED Triage Notes (Signed)
Unable to locate pt x2

## 2018-11-14 NOTE — ED Notes (Signed)
Pt called twice no answer

## 2018-11-14 NOTE — ED Triage Notes (Signed)
Pt arrives by POV with c/o of vaginal bleeding that has been ongoing since her emergent C section 5 months ago. Pt states she has seen her OB MD for same but today after getting out of shower she had a sudden gush of bleeding and worried her. Pt states she goes through about 5-10 pads per day.

## 2018-11-23 ENCOUNTER — Encounter: Payer: Self-pay | Admitting: Obstetrics

## 2018-11-23 ENCOUNTER — Ambulatory Visit: Payer: Medicaid Other | Admitting: Obstetrics

## 2018-11-23 DIAGNOSIS — N946 Dysmenorrhea, unspecified: Secondary | ICD-10-CM | POA: Diagnosis not present

## 2018-11-23 DIAGNOSIS — Z3009 Encounter for other general counseling and advice on contraception: Secondary | ICD-10-CM | POA: Diagnosis not present

## 2018-11-23 DIAGNOSIS — F53 Postpartum depression: Secondary | ICD-10-CM

## 2018-11-23 DIAGNOSIS — O99345 Other mental disorders complicating the puerperium: Secondary | ICD-10-CM

## 2018-11-23 DIAGNOSIS — Z3202 Encounter for pregnancy test, result negative: Secondary | ICD-10-CM

## 2018-11-23 DIAGNOSIS — Z30011 Encounter for initial prescription of contraceptive pills: Secondary | ICD-10-CM | POA: Diagnosis not present

## 2018-11-23 DIAGNOSIS — Z0000 Encounter for general adult medical examination without abnormal findings: Secondary | ICD-10-CM

## 2018-11-23 LAB — POCT URINE PREGNANCY: Preg Test, Ur: NEGATIVE

## 2018-11-23 MED ORDER — LO LOESTRIN FE 1 MG-10 MCG / 10 MCG PO TABS: 1.0000 | ORAL_TABLET | Freq: Every day | ORAL | 11 refills | Status: DC

## 2018-11-23 MED ORDER — IBUPROFEN 800 MG PO TABS
800.0000 mg | ORAL_TABLET | Freq: Three times a day (TID) | ORAL | 5 refills | Status: DC | PRN
Start: 2018-11-23 — End: 2020-03-06

## 2018-11-23 MED ORDER — VITAFOL ULTRA 29-0.6-0.4-200 MG PO CAPS: 1.0000 | ORAL_CAPSULE | Freq: Every day | ORAL | 11 refills | Status: DC

## 2018-11-23 NOTE — Progress Notes (Signed)
Pt presents to discuss birth control options. She request BCP's.

## 2018-11-23 NOTE — Progress Notes (Signed)
Subjective:     Maria Riley is a 27 y.o. female who presents for a postpartum visit. She is 3 months postpartum following a low cervical transverse Cesarean section with a fetal demise. I have fully reviewed the prenatal and intrapartum course. The delivery was at 35 gestational weeks. Outcome: primary cesarean section, low transverse incision. Anesthesia: spinal. Postpartum course has been complicated by depression, but this has improved.  Bleeding no bleeding. Bowel function is normal. Bladder function is normal. Patient is not sexually active. Contraception method is abstinence. Postpartum depression screening: positive.  Tobacco, alcohol and substance abuse history reviewed.  Adult immunizations reviewed including TDAP, rubella and varicella.  The following portions of the patient's history were reviewed and updated as appropriate: allergies, current medications, past family history, past medical history, past social history, past surgical history and problem list.  Review of Systems A comprehensive review of systems was negative except for: Behavioral/Psych: positive for depression   Objective:    BP 115/75   Pulse 84   Wt 134 lb 12.8 oz (61.1 kg)   BMI 27.23 kg/m    PE: Deferred    >50% of 15 min visit spent on counseling and coordination of care.   Assessment:     1. Postpartum care following cesarean delivery - doing well  2. Postpartum depression - much improved  3. Contraceptive education Rx: - POCT urine pregnancy  4. Encounter for initial prescription of contraceptive pills Rx: - LO LOESTRIN FE 1 MG-10 MCG / 10 MCG tablet; Take 1 tablet by mouth daily.  Dispense: 1 Package; Refill: 11  5. Dysmenorrhea Rx: - ibuprofen (ADVIL,MOTRIN) 800 MG tablet; Take 1 tablet (800 mg total) by mouth every 8 (eight) hours as needed.  Dispense: 30 tablet; Refill: 5  6. Routine adult health maintenance Rx; - Prenat-Fe Poly-Methfol-FA-DHA (VITAFOL ULTRA) 29-0.6-0.4-200 MG  CAPS; Take 1 capsule by mouth at bedtime.  Dispense: 30 capsule; Refill: 11    Plan:    1. Contraception: OCP (estrogen/progesterone) 2. LO Loestrin Fe Rx 3. Follow up in: 4 months or as needed.    Healthy lifestyle practices reviewed   Brock BadHARLES A.  MD 11-23-2018

## 2018-11-24 ENCOUNTER — Encounter: Payer: Self-pay | Admitting: Obstetrics

## 2018-12-01 ENCOUNTER — Ambulatory Visit (HOSPITAL_COMMUNITY)
Admission: EM | Admit: 2018-12-01 | Discharge: 2018-12-01 | Disposition: A | Discharge status: Home / Self Care | Payer: Medicaid Other | Attending: Internal Medicine | Admitting: Internal Medicine

## 2018-12-01 ENCOUNTER — Encounter (HOSPITAL_COMMUNITY): Payer: Self-pay

## 2018-12-01 DIAGNOSIS — J3089 Other allergic rhinitis: Secondary | ICD-10-CM | POA: Diagnosis not present

## 2018-12-01 MED ORDER — CETIRIZINE HCL 10 MG PO TABS: 10.0000 mg | ORAL_TABLET | Freq: Every day | ORAL | 0 refills | Status: DC

## 2018-12-01 NOTE — ED Triage Notes (Signed)
Pt presents with sore throat, persistent cough, nasal drainage, and ear pain & itchiness.

## 2018-12-01 NOTE — ED Provider Notes (Signed)
[facility]    CSN: [id number] Arrival date & time: 12/01/18  1728     History   Chief Complaint Chief Complaint  Patient presents with  . Sore Throat  . Cough    HPI Maria Riley is a 27 y.o. female.   27 yo female with no chronic medical problems presents to urgent care with her children because they are all sick.  She complains of itchy eyes, scratchy throat, and sneezing.  She also reports itchy ears.  She has not been taking anything.      Past Medical History:  Diagnosis Date  . Medical history non-contributory     Patient Active Problem List   Diagnosis Date Noted  . MDD (major depressive disorder), recurrent episode, severe (HCC) 07/08/2018  . DIC (disseminated intravascular coagulation) (HCC) 06/13/2018  . Antepartum placental abruption 06/13/2018  . PPH (postpartum hemorrhage) 06/13/2018  . Language barrier 06/13/2018  . Supervision of high risk pregnancy, antepartum 11/23/2016  . History of preterm delivery 11/23/2016    Past Surgical History:  Procedure Laterality Date  . CESAREAN SECTION N/A 06/13/2018   Procedure: CESAREAN SECTION;  Surgeon: [place] BingPickens, Charlie, MD;  [facility];  Service: Obstetrics;  Laterality: N/A;  . MOUTH SURGERY    . WISDOM TOOTH EXTRACTION Bilateral     OB History    Gravida  4   Para  4   Term  0   Preterm  4   AB  0   Living  4     SAB  0   TAB  0   Ectopic  0   Multiple  0   Live Births  4            Home Medications    Prior to Admission medications   Medication Sig Start Date End Date Taking? Authorizing Provider  cetirizine (ZYRTEC) 10 MG tablet Take 1 tablet (10 mg total) by mouth at bedtime. 12/01/18 12/31/18  Arnaldo Nataliamond, Earsel Shouse S, MD  ibuprofen (ADVIL,MOTRIN) 800 MG tablet Take 1 tablet (800 mg total) by mouth every 8 (eight) hours as needed. 11/23/18   Brock BadHarper, Charles A, MD  LO LOESTRIN FE 1 MG-10 MCG / 10 MCG tablet Take 1 tablet by mouth daily. 11/23/18    Brock BadHarper, Charles A, MD  medroxyPROGESTERone (DEPO-PROVERA) 150 MG/ML injection Inject 1 mL (150 mg total) into the muscle every 3 (three) months. Patient not taking: Reported on 11/14/2018 07/18/18   Brock BadHarper, Charles A, MD  Prenat-Fe Poly-Methfol-FA-DHA (VITAFOL ULTRA) 29-0.6-0.4-200 MG CAPS Take 1 capsule by mouth at bedtime. 11/23/18   Brock BadHarper, Charles A, MD    Family History Family History  Problem Relation Age of Onset  . Alcohol abuse Neg Hx   . Arthritis Neg Hx   . Asthma Neg Hx   . Birth defects Neg Hx   . Cancer Neg Hx   . COPD Neg Hx   . Depression Neg Hx   . Diabetes Neg Hx   . Drug abuse Neg Hx   . Early death Neg Hx   . Hearing loss Neg Hx   . Heart disease Neg Hx   . Hyperlipidemia Neg Hx   . Hypertension Neg Hx   . Kidney disease Neg Hx   . Learning disabilities Neg Hx   . Mental illness Neg Hx   . Mental retardation Neg Hx   . Miscarriages / Stillbirths Neg Hx   . Stroke Neg Hx   . Vision loss Neg Hx   .  Varicose Veins Neg Hx     Social History Social History   Tobacco Use  . Smoking status: Never Smoker  . Smokeless tobacco: Never Used  Substance Use Topics  . Alcohol use: No  . Drug use: No     Allergies   Patient has no known allergies.   Review of Systems Review of Systems  Constitutional: Negative for chills and fever.  HENT: Negative for sore throat and tinnitus.   Eyes: Negative for redness.  Respiratory: Negative for cough and shortness of breath.   Cardiovascular: Negative for chest pain and palpitations.  Gastrointestinal: Negative for abdominal pain, diarrhea, nausea and vomiting.  Genitourinary: Negative for dysuria, frequency and urgency.  Musculoskeletal: Negative for myalgias.  Skin: Negative for rash.       No lesions  Neurological: Negative for weakness.  Hematological: Does not bruise/bleed easily.  Psychiatric/Behavioral: Negative for suicidal ideas.     Physical Exam Triage Vital Signs ED Triage Vitals  Enc Vitals  Group     BP 12/01/18 1918 118/67     Pulse Rate 12/01/18 1918 90     Resp 12/01/18 1918 20     Temp 12/01/18 1918 97.8 F (36.6 C)     Temp Source 12/01/18 1918 Oral     SpO2 12/01/18 1918 100 %     Weight --      Height --      Head Circumference --      Peak Flow --      Pain Score 12/01/18 1919 7     Pain Loc --      Pain Edu? --      Excl. in GC? --    No data found.  Updated Vital Signs BP 118/67 (BP Location: Right Arm)   Pulse 90   Temp 97.8 F (36.6 C) (Oral)   Resp 20   SpO2 100%   Visual Acuity Right Eye Distance:   Left Eye Distance:   Bilateral Distance:    Right Eye Near:   Left Eye Near:    Bilateral Near:     Physical Exam Vitals signs and nursing note reviewed.  Constitutional:      General: She is not in acute distress.    Appearance: She is well-developed.  HENT:     Head: Normocephalic and atraumatic.  Eyes:     General: No scleral icterus.    Conjunctiva/sclera: Conjunctivae normal.     Pupils: Pupils are equal, round, and reactive to light.  Neck:     Musculoskeletal: Normal range of motion and neck supple.     Thyroid: No thyromegaly.     Vascular: No JVD.     Trachea: No tracheal deviation.  Cardiovascular:     Rate and Rhythm: Normal rate and regular rhythm.     Heart sounds: Normal heart sounds. No murmur. No friction rub. No gallop.   Pulmonary:     Effort: Pulmonary effort is normal.     Breath sounds: Normal breath sounds.  Abdominal:     General: Bowel sounds are normal. There is no distension.     Palpations: Abdomen is soft.     Tenderness: There is no abdominal tenderness.  Musculoskeletal: Normal range of motion.  Lymphadenopathy:     Cervical: No cervical adenopathy.  Skin:    General: Skin is warm and dry.  Neurological:     Mental Status: She is alert and oriented to person, place, and time.     Cranial Nerves: No  cranial nerve deficit.  Psychiatric:        Behavior: Behavior normal.        Thought Content:  Thought content normal.        Judgment: Judgment normal.      UC Treatments / Results  Labs (all labs ordered are listed, but only abnormal results are displayed) Labs Reviewed - No data to display  EKG None  Radiology No results found.  Procedures Procedures (including critical care time)  Medications Ordered in UC Medications - No data to display  Initial Impression / Assessment and Plan / UC Course  I have reviewed the triage vital signs and the nursing notes.  Pertinent labs & imaging results that were available during my care of the patient were reviewed by me and considered in my medical decision making (see chart for details).     Symptoms consistent with allergies. Rx antihistamine  Final Clinical Impressions(s) / UC Diagnoses   Final diagnoses:  Seasonal allergic rhinitis due to other allergic trigger   Discharge Instructions   None    ED Prescriptions    Medication Sig Dispense Auth. Provider   cetirizine (ZYRTEC) 10 MG tablet Take 1 tablet (10 mg total) by mouth at bedtime. 30 tablet Arnaldo Natal, MD     Controlled Substance Prescriptions [place] Controlled Substance Registry consulted? Not Applicable   Arnaldo Natal, MD 12/01/18 2025

## 2019-04-04 ENCOUNTER — Ambulatory Visit (HOSPITAL_COMMUNITY)
Admission: EM | Admit: 2019-04-04 | Discharge: 2019-04-04 | Disposition: A | Discharge status: Home / Self Care | Payer: Medicaid Other | Attending: Family Medicine | Admitting: Family Medicine

## 2019-04-04 ENCOUNTER — Encounter (HOSPITAL_COMMUNITY): Payer: Self-pay | Admitting: Emergency Medicine

## 2019-04-04 ENCOUNTER — Other Ambulatory Visit: Payer: Self-pay

## 2019-04-04 DIAGNOSIS — N390 Urinary tract infection, site not specified: Secondary | ICD-10-CM

## 2019-04-04 DIAGNOSIS — R310 Gross hematuria: Secondary | ICD-10-CM

## 2019-04-04 LAB — POCT URINALYSIS DIP (DEVICE)
Bilirubin Urine: NEGATIVE
Glucose, UA: NEGATIVE mg/dL
Ketones, ur: NEGATIVE mg/dL
Nitrite: NEGATIVE
Protein, ur: 100 mg/dL — AB
Specific Gravity, Urine: >=1.03 (ref 1.005–1.030)
Urobilinogen, UA: 0.2 mg/dL (ref 0.0–1.0)
pH: 6.5 (ref 5.0–8.0)

## 2019-04-04 MED ORDER — NITROFURANTOIN MONOHYD MACRO 100 MG PO CAPS: 100.0000 mg | ORAL_CAPSULE | Freq: Two times a day (BID) | ORAL | 0 refills | Status: AC

## 2019-04-04 NOTE — ED Provider Notes (Signed)
[facility]    CSN: [id number] Arrival date & time: 04/04/19  1426     History   Chief Complaint Chief Complaint  Patient presents with  . Hematuria    HPI  Maria Riley is a 28 y.o. female no contributing PMH; Patient is presenting with symptoms of dysuria, increased frequency, incomplete voiding, abdominal pressure. Symptoms began 4 days ago. She has also had hematuria which began today. Denies vaginal discharge. Denies fever, nausea, vomiting. Has had some lower back discomfort. Denies history of UTI.    HPI  Past Medical History:  Diagnosis Date  . Medical history non-contributory     Patient Active Problem List   Diagnosis Date Noted  . MDD (major depressive disorder), recurrent episode, severe (HCC) 07/08/2018  . DIC (disseminated intravascular coagulation) (HCC) 06/13/2018  . Antepartum placental abruption 06/13/2018  . PPH (postpartum hemorrhage) 06/13/2018  . Language barrier 06/13/2018  . Supervision of high risk pregnancy, antepartum 11/23/2016  . History of preterm delivery 11/23/2016    Past Surgical History:  Procedure Laterality Date  . CESAREAN SECTION N/A 06/13/2018   Procedure: CESAREAN SECTION;  Surgeon: [place] Bing, MD;  Location: [facility] BIRTHING SUIT[facility];  Service: Obstetrics;  Laterality: N/A;  . MOUTH SURGERY    . WISDOM TOOTH EXTRACTION Bilateral     OB History    Gravida  4   Para  4   Term  0   Preterm  4   AB  0   Living  4     SAB  0   TAB  0   Ectopic  0   Multiple  0   Live Births  4            Home Medications    Prior to Admission medications   Medication Sig Start Date End Date Taking? Authorizing Provider  cetirizine (ZYRTEC) 10 MG tablet Take 1 tablet (10 mg total) by mouth at bedtime. 12/01/18 12/31/18  Arnaldo Natal, MD  ibuprofen (ADVIL,MOTRIN) 800 MG tablet Take 1 tablet (800 mg total) by mouth every 8 (eight) hours as needed. 11/23/18   Brock Bad, MD  LO LOESTRIN FE 1  MG-10 MCG / 10 MCG tablet Take 1 tablet by mouth daily. 11/23/18   Brock Bad, MD  medroxyPROGESTERone (DEPO-PROVERA) 150 MG/ML injection Inject 1 mL (150 mg total) into the muscle every 3 (three) months. Patient not taking: Reported on 11/14/2018 07/18/18   Brock Bad, MD  nitrofurantoin, macrocrystal-monohydrate, (MACROBID) 100 MG capsule Take 1 capsule (100 mg total) by mouth 2 (two) times daily for 5 days. 04/04/19 04/09/19  Efrain Clauson C, PA-C  Prenat-Fe Poly-Methfol-FA-DHA (VITAFOL ULTRA) 29-0.6-0.4-200 MG CAPS Take 1 capsule by mouth at bedtime. 11/23/18   Brock Bad, MD    Family History Family History  Problem Relation Age of Onset  . Healthy Father   . Alcohol abuse Neg Hx   . Arthritis Neg Hx   . Asthma Neg Hx   . Birth defects Neg Hx   . Cancer Neg Hx   . COPD Neg Hx   . Depression Neg Hx   . Diabetes Neg Hx   . Drug abuse Neg Hx   . Early death Neg Hx   . Hearing loss Neg Hx   . Heart disease Neg Hx   . Hyperlipidemia Neg Hx   . Hypertension Neg Hx   . Kidney disease Neg Hx   . Learning disabilities Neg Hx   .  Mental illness Neg Hx   . Mental retardation Neg Hx   . Miscarriages / Stillbirths Neg Hx   . Stroke Neg Hx   . Vision loss Neg Hx   . Varicose Veins Neg Hx     Social History Social History   Tobacco Use  . Smoking status: Never Smoker  . Smokeless tobacco: Never Used  Substance Use Topics  . Alcohol use: No  . Drug use: No     Allergies   Patient has no known allergies.   Review of Systems Review of Systems  Constitutional: Negative for fever.  Respiratory: Negative for shortness of breath.   Cardiovascular: Negative for chest pain.  Gastrointestinal: Negative for abdominal pain, diarrhea, nausea and vomiting.  Genitourinary: Positive for dysuria, frequency, hematuria and urgency. Negative for flank pain, genital sores, menstrual problem, vaginal bleeding, vaginal discharge and vaginal pain.  Musculoskeletal: Negative  for back pain.  Skin: Negative for rash.  Neurological: Negative for dizziness, light-headedness and headaches.     Physical Exam Triage Vital Signs ED Triage Vitals  Enc Vitals Group     BP 04/04/19 1527 102/60     Pulse Rate 04/04/19 1527 78     Resp 04/04/19 1527 18     Temp 04/04/19 1527 98 F (36.7 C)     Temp Source 04/04/19 1527 Oral     SpO2 04/04/19 1527 100 %     Weight --      Height --      Head Circumference --      Peak Flow --      Pain Score 04/04/19 1528 3     Pain Loc --      Pain Edu? --      Excl. in GC? --    No data found.  Updated Vital Signs BP 102/60 (BP Location: Left Arm)   Pulse 78   Temp 98 F (36.7 C) (Oral)   Resp 18   SpO2 100%   Visual Acuity Right Eye Distance:   Left Eye Distance:   Bilateral Distance:    Right Eye Near:   Left Eye Near:    Bilateral Near:     Physical Exam Vitals signs and nursing note reviewed.  Constitutional:      Appearance: She is well-developed.     Comments: No acute distress  HENT:     Head: Normocephalic and atraumatic.     Nose: Nose normal.  Eyes:     Conjunctiva/sclera: Conjunctivae normal.  Neck:     Musculoskeletal: Neck supple.  Cardiovascular:     Rate and Rhythm: Normal rate.  Pulmonary:     Effort: Pulmonary effort is normal. No respiratory distress.  Abdominal:     General: There is no distension.     Palpations: Abdomen is soft.     Comments: Mild tenderness over suprapubic area Nontender to upper quadrants and epigastrum Negative rebound, Negative rovsing, negative mcburneys No CVA tenderness   Musculoskeletal: Normal range of motion.  Skin:    General: Skin is warm and dry.  Neurological:     Mental Status: She is alert and oriented to person, place, and time.      UC Treatments / Results  Labs (all labs ordered are listed, but only abnormal results are displayed) Labs Reviewed  POCT URINALYSIS DIP (DEVICE) - Abnormal; Notable for the following components:       Result Value   Hgb urine dipstick LARGE (*)    Protein, ur 100 (*)  Leukocytes,Ua SMALL (*)    All other components within normal limits  URINE CULTURE  POC URINE PREG, ED    EKG None  Radiology No results found.  Procedures Procedures (including critical care time)  Medications Ordered in UC Medications - No data to display  Initial Impression / Assessment and Plan / UC Course  I have reviewed the triage vital signs and the nursing notes.  Pertinent labs & imaging results that were available during my care of the patient were reviewed by me and considered in my medical decision making (see chart for details).     UA suggestive of UTI, VSS, exam benign. Negative peritoneal signs, no CVA tenderness. Do not suspect pyelonephritis. Will treat with macrobid BID x 5 days. Culture obtained to check sensitivities. Discussed strict return precautions. Patient verbalized understanding and is agreeable with plan.  Final Clinical Impressions(s) / UC Diagnoses   Final diagnoses:  Lower urinary tract infectious disease  Gross hematuria     Discharge Instructions     Tiene infeccion en su urino Empice macrobid [employer] cada dia por 5 dias Beber mucho agua Regrese si su sintomas no mejoran or tiene [place], mas dolor or sangre persistente   ED Prescriptions    Medication Sig Dispense Auth. Provider   nitrofurantoin, macrocrystal-monohydrate, (MACROBID) 100 MG capsule Take 1 capsule (100 mg total) by mouth 2 (two) times daily for 5 days. 10 capsule Hoda Hon C, PA-C     Controlled Substance Prescriptions [place] Controlled Substance Registry consulted? Not Applicable   Lew Dawes, [place] 04/05/19 1003

## 2019-04-04 NOTE — Discharge Instructions (Signed)
Tiene infeccion en su urino Empice macrobid [employer] cada dia por 5 dias Beber mucho agua Regrese si su sintomas no mejoran or tiene [place], mas dolor or sangre persistente

## 2019-04-04 NOTE — ED Triage Notes (Signed)
Pt here for hematuria and lower abd pain x 4 days

## 2019-04-05 LAB — URINE CULTURE: Culture: <10000 — AB

## 2019-04-05 LAB — POCT PREGNANCY, URINE: Preg Test, Ur: NEGATIVE

## 2019-11-09 ENCOUNTER — Other Ambulatory Visit: Payer: Self-pay | Admitting: Obstetrics

## 2019-11-09 DIAGNOSIS — Z0000 Encounter for general adult medical examination without abnormal findings: Secondary | ICD-10-CM

## 2020-03-06 ENCOUNTER — Other Ambulatory Visit (HOSPITAL_COMMUNITY)
Admission: RE | Admit: 2020-03-06 | Discharge: 2020-03-06 | Disposition: A | Discharge status: Home / Self Care | Payer: Medicaid Other | Source: Ambulatory Visit | Attending: Obstetrics | Admitting: Obstetrics

## 2020-03-06 ENCOUNTER — Other Ambulatory Visit: Payer: Self-pay

## 2020-03-06 ENCOUNTER — Encounter: Payer: Self-pay | Admitting: Obstetrics

## 2020-03-06 ENCOUNTER — Ambulatory Visit (INDEPENDENT_AMBULATORY_CARE_PROVIDER_SITE_OTHER): Payer: Medicaid Other | Admitting: Obstetrics

## 2020-03-06 VITALS — BP 92/60 | HR 81 | Ht 59.0 in | Wt 137.1 lb

## 2020-03-06 DIAGNOSIS — N926 Irregular menstruation, unspecified: Secondary | ICD-10-CM | POA: Diagnosis not present

## 2020-03-06 DIAGNOSIS — J302 Other seasonal allergic rhinitis: Secondary | ICD-10-CM

## 2020-03-06 DIAGNOSIS — Z3202 Encounter for pregnancy test, result negative: Secondary | ICD-10-CM | POA: Diagnosis not present

## 2020-03-06 DIAGNOSIS — Z01419 Encounter for gynecological examination (general) (routine) without abnormal findings: Secondary | ICD-10-CM

## 2020-03-06 DIAGNOSIS — Z3041 Encounter for surveillance of contraceptive pills: Secondary | ICD-10-CM

## 2020-03-06 DIAGNOSIS — Z0000 Encounter for general adult medical examination without abnormal findings: Secondary | ICD-10-CM

## 2020-03-06 DIAGNOSIS — N946 Dysmenorrhea, unspecified: Secondary | ICD-10-CM

## 2020-03-06 LAB — POCT URINE PREGNANCY: Preg Test, Ur: NEGATIVE

## 2020-03-06 MED ORDER — VITAFOL ULTRA 29-0.6-0.4-200 MG PO CAPS: 1.0000 | ORAL_CAPSULE | Freq: Every day | ORAL | 11 refills | Status: DC

## 2020-03-06 MED ORDER — IBUPROFEN 800 MG PO TABS: 800.0000 mg | ORAL_TABLET | Freq: Three times a day (TID) | ORAL | 5 refills | Status: DC | PRN

## 2020-03-06 MED ORDER — CETIRIZINE HCL 10 MG PO TABS: 10.0000 mg | ORAL_TABLET | Freq: Every day | ORAL | 0 refills | Status: DC

## 2020-03-06 MED ORDER — LO LOESTRIN FE 1 MG-10 MCG / 10 MCG PO TABS: 1.0000 | ORAL_TABLET | Freq: Every day | ORAL | 11 refills | Status: DC

## 2020-03-06 NOTE — Addendum Note (Signed)
Addended by: Lorelle Gibbs L on: 03/06/2020 11:39 AM   Modules accepted: Orders

## 2020-03-06 NOTE — Progress Notes (Signed)
Subjective:        Maria Riley is a 29 y.o. female here for a routine exam.  Current complaints: Irregular periods.    Personal health questionnaire:  Is patient Ashkenazi Jewish, have a family history of breast and/or ovarian cancer: no Is there a family history of uterine cancer diagnosed at age < 55, gastrointestinal cancer, urinary tract cancer, family member who is a Personnel officer syndrome-associated carrier: no Is the patient overweight and hypertensive, family history of diabetes, personal history of gestational diabetes, preeclampsia or PCOS: no Is patient over 89, have PCOS,  family history of premature CHD under age 33, diabetes, smoke, have hypertension or peripheral artery disease:  no At any time, has a partner hit, kicked or otherwise hurt or frightened you?: no Over the past 2 weeks, have you felt down, depressed or hopeless?: no Over the past 2 weeks, have you felt little interest or pleasure in doing things?:no   Gynecologic History Patient's last menstrual period was 12/30/2018 (exact date). Contraception: none Last Pap: 06-13-2018. Results were: normal Last mammogram: n/a. Results were: n/a  Obstetric History OB History  Gravida Para Term Preterm AB Living  4 4 0 4 0 4  SAB TAB Ectopic Multiple Live Births  0 0 0 0 4    # Outcome Date GA Lbr Len/2nd Weight Sex Delivery Anes PTL Lv  4 Preterm 06/13/18 [redacted]w[redacted]d  4 lb 8.3 oz (2.05 kg) F CS-LTranv Gen  LIV  3 Preterm 05/21/17 [redacted]w[redacted]d 17:24 / 02:23 6 lb 2.8 oz (2.8 kg) F Vag-Spont EPI  LIV     Birth Comments: caput  2 Preterm 12/19/14 [redacted]w[redacted]d 11:57 / 00:37 5 lb 5 oz (2.41 kg) F Vag-Spont EPI  LIV  1 Preterm 03/18/11 [redacted]w[redacted]d  5 lb 15 oz (2.693 kg) M Vag-Spont   LIV    Past Medical History:  Diagnosis Date  . Medical history non-contributory     Past Surgical History:  Procedure Laterality Date  . CESAREAN SECTION N/A 06/13/2018   Procedure: CESAREAN SECTION;  Surgeon: Effingham Bing, MD;  Location: [facility] BIRTHING SUITES;   Service: Obstetrics;  Laterality: N/A;  . MOUTH SURGERY    . WISDOM TOOTH EXTRACTION Bilateral      Current Outpatient Medications:  Marland Kitchen  Multiple Vitamin (MULTIVITAMIN) capsule, Take 1 capsule by mouth daily., Disp: , Rfl:  .  cetirizine (ZYRTEC) 10 MG tablet, Take 1 tablet (10 mg total) by mouth at bedtime., Disp: 30 tablet, Rfl: 0 .  ibuprofen (ADVIL) 800 MG tablet, Take 1 tablet (800 mg total) by mouth every 8 (eight) hours as needed., Disp: 30 tablet, Rfl: 5 .  LO LOESTRIN FE 1 MG-10 MCG / 10 MCG tablet, Take 1 tablet by mouth daily., Disp: 1 Package, Rfl: 11 .  medroxyPROGESTERone (DEPO-PROVERA) 150 MG/ML injection, Inject 1 mL (150 mg total) into the muscle every 3 (three) months. (Patient not taking: Reported on 11/14/2018), Disp: 1 mL, Rfl: 4 .  Prenat-Fe Poly-Methfol-FA-DHA (VITAFOL ULTRA) 29-0.6-0.4-200 MG CAPS, Take 1 capsule by mouth at bedtime., Disp: 30 capsule, Rfl: 11 No Known Allergies  Social History   Tobacco Use  . Smoking status: Never Smoker  . Smokeless tobacco: Never Used  Substance Use Topics  . Alcohol use: No    Family History  Problem Relation Age of Onset  . Healthy Father   . Alcohol abuse Neg Hx   . Arthritis Neg Hx   . Asthma Neg Hx   . Birth defects Neg Hx   .  Cancer Neg Hx   . COPD Neg Hx   . Depression Neg Hx   . Diabetes Neg Hx   . Drug abuse Neg Hx   . Early death Neg Hx   . Hearing loss Neg Hx   . Heart disease Neg Hx   . Hyperlipidemia Neg Hx   . Hypertension Neg Hx   . Kidney disease Neg Hx   . Learning disabilities Neg Hx   . Mental illness Neg Hx   . Mental retardation Neg Hx   . Miscarriages / Stillbirths Neg Hx   . Stroke Neg Hx   . Vision loss Neg Hx   . Varicose Veins Neg Hx       Review of Systems  Constitutional: negative for fatigue and weight loss Respiratory: negative for cough and wheezing Cardiovascular: negative for chest pain, fatigue and palpitations Gastrointestinal: negative for abdominal pain and change in  bowel habits Musculoskeletal:negative for myalgias Neurological: negative for gait problems and tremors Behavioral/Psych: negative for abusive relationship, depression Endocrine: negative for temperature intolerance    Genitourinary:negative for abnormal menstrual periods, genital lesions, hot flashes, sexual problems and vaginal discharge Integument/breast: negative for breast lump, breast tenderness, nipple discharge and skin lesion(s)    Objective:       BP 92/60   Pulse 81   Ht 4\' 11"  (1.499 m)   Wt 137 lb 1.3 oz (62.2 kg)   LMP 12/30/2018 (Exact Date)   BMI 27.69 kg/m  General:   alert  Skin:   no rash or abnormalities  Lungs:   clear to auscultation bilaterally  Heart:   regular rate and rhythm, S1, S2 normal, no murmur, click, rub or gallop  Breasts:   normal without suspicious masses, skin or nipple changes or axillary nodes  Abdomen:  normal findings: no organomegaly, soft, non-tender and no hernia  Pelvis:  External genitalia: normal general appearance Urinary system: urethral meatus normal and bladder without fullness, nontender Vaginal: normal without tenderness, induration or masses Cervix: normal appearance Adnexa: normal bimanual exam Uterus: anteverted and non-tender, normal size   Lab Review Urine pregnancy test Labs reviewed yes Radiologic studies reviewed no  50% of 25 min visit spent on counseling and coordination of care.   Assessment:    Healthy female exam.    Plan:    Education reviewed: calcium supplements, depression evaluation, low fat, low cholesterol diet, safe sex/STD prevention, skin cancer screening and weight bearing exercise. Contraception: OCP (estrogen/progesterone). Follow up in: 1 year.   Meds ordered this encounter  Medications  . LO LOESTRIN FE 1 MG-10 MCG / 10 MCG tablet    Sig: Take 1 tablet by mouth daily.    Dispense:  1 Package    Refill:  11    Submit other coverage code 3  BIN:  [id number]  PCN:  [id number]   GRP:  [id number]    ID:  [id number]  . ibuprofen (ADVIL) 800 MG tablet    Sig: Take 1 tablet (800 mg total) by mouth every 8 (eight) hours as needed.    Dispense:  30 tablet    Refill:  5  . cetirizine (ZYRTEC) 10 MG tablet    Sig: Take 1 tablet (10 mg total) by mouth at bedtime.    Dispense:  30 tablet    Refill:  0  . Prenat-Fe Poly-Methfol-FA-DHA (VITAFOL ULTRA) 29-0.6-0.4-200 MG CAPS    Sig: Take 1 capsule by mouth at bedtime.    Dispense:  30 capsule    Refill:  11     Brock Bad, MD 03/06/2020 11:35 AM

## 2020-03-08 LAB — CYTOLOGY - PAP: Diagnosis: NEGATIVE

## 2020-07-06 ENCOUNTER — Encounter (HOSPITAL_COMMUNITY): Payer: Self-pay

## 2020-07-06 ENCOUNTER — Ambulatory Visit (HOSPITAL_COMMUNITY)
Admission: EM | Admit: 2020-07-06 | Discharge: 2020-07-06 | Disposition: A | Discharge status: Home / Self Care | Payer: Medicaid Other | Attending: Urgent Care | Admitting: Urgent Care

## 2020-07-06 DIAGNOSIS — Z20822 Contact with and (suspected) exposure to covid-19: Secondary | ICD-10-CM | POA: Diagnosis not present

## 2020-07-06 DIAGNOSIS — J029 Acute pharyngitis, unspecified: Secondary | ICD-10-CM | POA: Insufficient documentation

## 2020-07-06 DIAGNOSIS — R6883 Chills (without fever): Secondary | ICD-10-CM | POA: Diagnosis not present

## 2020-07-06 DIAGNOSIS — J069 Acute upper respiratory infection, unspecified: Secondary | ICD-10-CM | POA: Diagnosis not present

## 2020-07-06 DIAGNOSIS — J302 Other seasonal allergic rhinitis: Secondary | ICD-10-CM | POA: Insufficient documentation

## 2020-07-06 DIAGNOSIS — Z79899 Other long term (current) drug therapy: Secondary | ICD-10-CM | POA: Insufficient documentation

## 2020-07-06 DIAGNOSIS — R059 Cough, unspecified: Secondary | ICD-10-CM

## 2020-07-06 LAB — POCT RAPID STREP A: Streptococcus, Group A Screen (Direct): NEGATIVE

## 2020-07-06 LAB — SARS CORONAVIRUS 2 (TAT 6-24 HRS): SARS Coronavirus 2: NEGATIVE

## 2020-07-06 MED ORDER — BENZONATATE 100 MG PO CAPS: 100.0000 mg | ORAL_CAPSULE | Freq: Three times a day (TID) | ORAL | 0 refills | Status: DC | PRN

## 2020-07-06 MED ORDER — PSEUDOEPHEDRINE HCL 60 MG PO TABS: 60.0000 mg | ORAL_TABLET | Freq: Three times a day (TID) | ORAL | 0 refills | Status: DC | PRN

## 2020-07-06 MED ORDER — PROMETHAZINE-DM 6.25-15 MG/5ML PO SYRP: 5.0000 mL | ORAL_SOLUTION | Freq: Every evening | ORAL | 0 refills | Status: DC | PRN

## 2020-07-06 MED ORDER — CETIRIZINE HCL 10 MG PO TABS: 10.0000 mg | ORAL_TABLET | Freq: Every day | ORAL | 0 refills | Status: DC

## 2020-07-06 NOTE — ED Triage Notes (Signed)
Pt presents with sore throat,chils and itchiness in ears x 1 day. Tylenol gives somewhat relief.

## 2020-07-06 NOTE — ED Provider Notes (Signed)
[facility]   MRN: [id number] DOB: 04/08/1991  Subjective:   Maria Riley is a 29 y.o. female presenting for 2-day history of acute onset runny and stuffy nose, throat pain, difficulty swallowing, cough.  Patient has had her Covid vaccination about 2 months ago.  Denies having loss sense of taste and smell.  Denies chest pain, shortness of breath, nausea, vomiting, belly pain.  Patient denies history of allergies.  No current facility-administered medications for this encounter.  Current Outpatient Medications:  .  acetaminophen (TYLENOL) 500 MG tablet, Take 500 mg by mouth every 6 (six) hours as needed., Disp: , Rfl:  .  cetirizine (ZYRTEC) 10 MG tablet, Take 1 tablet (10 mg total) by mouth at bedtime., Disp: 30 tablet, Rfl: 0 .  Prenat-Fe Poly-Methfol-FA-DHA (VITAFOL ULTRA) 29-0.6-0.4-200 MG CAPS, Take 1 capsule by mouth at bedtime., Disp: 30 capsule, Rfl: 11   No Known Allergies  Past Medical History:  Diagnosis Date  . Medical history non-contributory      Past Surgical History:  Procedure Laterality Date  . CESAREAN SECTION N/A 06/13/2018   Procedure: CESAREAN SECTION;  Surgeon:  Bing, MD;  Location: [facility] BIRTHING SUITES;  Service: Obstetrics;  Laterality: N/A;  . MOUTH SURGERY    . WISDOM TOOTH EXTRACTION Bilateral     Family History  Problem Relation Age of Onset  . Healthy Father   . Alcohol abuse Neg Hx   . Arthritis Neg Hx   . Asthma Neg Hx   . Birth defects Neg Hx   . Cancer Neg Hx   . COPD Neg Hx   . Depression Neg Hx   . Diabetes Neg Hx   . Drug abuse Neg Hx   . Early death Neg Hx   . Hearing loss Neg Hx   . Heart disease Neg Hx   . Hyperlipidemia Neg Hx   . Hypertension Neg Hx   . Kidney disease Neg Hx   . Learning disabilities Neg Hx   . Mental illness Neg Hx   . Mental retardation Neg Hx   . Miscarriages / Stillbirths Neg Hx   . Stroke Neg Hx   . Vision loss Neg Hx   . Varicose Veins Neg Hx     Social History   Tobacco  Use  . Smoking status: Never Smoker  . Smokeless tobacco: Never Used  Substance Use Topics  . Alcohol use: No  . Drug use: No    ROS   Objective:   Vitals: BP (!) 110/64 (BP Location: Right Arm)   Pulse 84   Temp 98.3 F (36.8 C) (Oral)   Resp 17   LMP  (Within Weeks) Comment: 2 weeks  SpO2 100%   Physical Exam Constitutional:      General: She is not in acute distress.    Appearance: Normal appearance. She is well-developed. She is not ill-appearing, toxic-appearing or diaphoretic.  HENT:     Head: Normocephalic and atraumatic.     Right Ear: Ear canal normal. No drainage or tenderness. No middle ear effusion. Tympanic membrane is not erythematous.     Left Ear: Ear canal normal. No drainage or tenderness.  No middle ear effusion. Tympanic membrane is not erythematous.     Ears:     Comments: TMs opacified.     Nose: Congestion and rhinorrhea present.     Mouth/Throat:     Mouth: Mucous membranes are moist. No oral lesions.     Pharynx: No pharyngeal swelling, oropharyngeal exudate,  posterior oropharyngeal erythema or uvula swelling.     Tonsils: No tonsillar exudate or tonsillar abscesses.  Eyes:     Extraocular Movements: Extraocular movements intact.     Right eye: Normal extraocular motion.     Left eye: Normal extraocular motion.     Conjunctiva/sclera: Conjunctivae normal.     Pupils: Pupils are equal, round, and reactive to light.  Cardiovascular:     Rate and Rhythm: Normal rate and regular rhythm.     Pulses: Normal pulses.     Heart sounds: Normal heart sounds. No murmur heard.  No friction rub. No gallop.   Pulmonary:     Effort: Pulmonary effort is normal. No respiratory distress.     Breath sounds: Normal breath sounds. No stridor. No wheezing, rhonchi or rales.  Musculoskeletal:     Cervical back: Normal range of motion and neck supple.  Lymphadenopathy:     Cervical: No cervical adenopathy.  Skin:    General: Skin is warm and dry.     Findings:  No rash.  Neurological:     General: No focal deficit present.     Mental Status: She is alert and oriented to person, place, and time.  Psychiatric:        Mood and Affect: Mood normal.        Behavior: Behavior normal.        Thought Content: Thought content normal.    Negative rapid strep test on visual inspection.  Assessment and Plan :   PDMP not reviewed this encounter.  1. Viral upper respiratory tract infection   2. Seasonal allergies   3. Sore throat   4. Chills   5. Cough     Will manage for viral illness such as viral URI, viral syndrome, viral rhinitis, COVID-19. Counseled patient on nature of COVID-19 including modes of transmission, diagnostic testing, management and supportive care.  Offered scripts for symptomatic relief. COVID 19 testing is pending. Counseled patient on potential for adverse effects with medications prescribed/recommended today, ER and return-to-clinic precautions discussed, patient verbalized understanding.     Wallis Bamberg, PA-C 07/06/20 1807

## 2020-07-06 NOTE — Discharge Instructions (Addendum)
Para el dolor de garganta o tos puede usar un t de miel. Use 3 cucharaditas de miel con jugo exprimido de medio limn. Coloque trozos de jengibre afeitado en 1/2-1 taza de agua y caliente sobre la estufa. Luego mezcle los ingredientes y repita cada 4 horas. Para fiebre, dolores de cuerpo tome ibuprofeno 400mg-600mg con comida cada 6 horas alternando con o junto con Tylenol 500mg-650mg cada 6 horas. Hidrata muy bien con al menos 2 litros (64 onzas) de agua al dia. Coma comidas ligeras como sopas para mantener los electrolitos y nutricion. Tambien puede tomar suero. Comience un antihistamnico como Zyrtec (cetirizina) 10mg al dia. Puede usar pseudoefedrina (Sudafed) de venta libre para el goteo posnasal, congestin a una dosis de 60 mg cada 8 horas o 120 mg cada 12 horas. Use el jarabe por la noche para su tos y las capsulas durante el dia.  

## 2020-07-09 LAB — CULTURE, GROUP A STREP (THRC)

## 2020-12-19 ENCOUNTER — Other Ambulatory Visit: Payer: Self-pay

## 2020-12-19 ENCOUNTER — Encounter (HOSPITAL_COMMUNITY): Payer: Self-pay | Admitting: Emergency Medicine

## 2020-12-19 ENCOUNTER — Emergency Department (HOSPITAL_COMMUNITY)
Admission: EM | Admit: 2020-12-19 | Discharge: 2020-12-19 | Disposition: A | Discharge status: Home / Self Care | Payer: Medicaid Other | Attending: Emergency Medicine | Admitting: Emergency Medicine

## 2020-12-19 DIAGNOSIS — Z20822 Contact with and (suspected) exposure to covid-19: Secondary | ICD-10-CM | POA: Insufficient documentation

## 2020-12-19 DIAGNOSIS — T50904A Poisoning by unspecified drugs, medicaments and biological substances, undetermined, initial encounter: Secondary | ICD-10-CM

## 2020-12-19 DIAGNOSIS — T50902A Poisoning by unspecified drugs, medicaments and biological substances, intentional self-harm, initial encounter: Secondary | ICD-10-CM

## 2020-12-19 DIAGNOSIS — T50901A Poisoning by unspecified drugs, medicaments and biological substances, accidental (unintentional), initial encounter: Secondary | ICD-10-CM

## 2020-12-19 DIAGNOSIS — T39392A Poisoning by other nonsteroidal anti-inflammatory drugs [NSAID], intentional self-harm, initial encounter: Secondary | ICD-10-CM | POA: Insufficient documentation

## 2020-12-19 DIAGNOSIS — G4489 Other headache syndrome: Secondary | ICD-10-CM | POA: Diagnosis not present

## 2020-12-19 DIAGNOSIS — F29 Unspecified psychosis not due to a substance or known physiological condition: Secondary | ICD-10-CM | POA: Diagnosis not present

## 2020-12-19 DIAGNOSIS — R000 Tachycardia, unspecified: Secondary | ICD-10-CM | POA: Diagnosis not present

## 2020-12-19 DIAGNOSIS — R2681 Unsteadiness on feet: Secondary | ICD-10-CM | POA: Diagnosis not present

## 2020-12-19 DIAGNOSIS — R1084 Generalized abdominal pain: Secondary | ICD-10-CM | POA: Diagnosis not present

## 2020-12-19 DIAGNOSIS — R52 Pain, unspecified: Secondary | ICD-10-CM | POA: Diagnosis not present

## 2020-12-19 LAB — COMPREHENSIVE METABOLIC PANEL
ALT: 16 U/L (ref 0–44)
ALT: 15 U/L (ref 0–44)
AST: 17 U/L (ref 15–41)
AST: 15 U/L (ref 15–41)
Albumin: 3.8 g/dL (ref 3.5–5.0)
Albumin: 3.7 g/dL (ref 3.5–5.0)
Alkaline Phosphatase: 67 U/L (ref 38–126)
Alkaline Phosphatase: 62 U/L (ref 38–126)
Anion gap: 12 (ref 5–15)
Anion gap: 10 (ref 5–15)
BUN: 11 mg/dL (ref 6–20)
BUN: 10 mg/dL (ref 6–20)
CO2: 19 mmol/L — ABNORMAL LOW (ref 22–32)
CO2: 19 mmol/L — ABNORMAL LOW (ref 22–32)
Calcium: 8.9 mg/dL (ref 8.9–10.3)
Calcium: 9.2 mg/dL (ref 8.9–10.3)
Chloride: 108 mmol/L (ref 98–111)
Chloride: 110 mmol/L (ref 98–111)
Creatinine, Ser: 0.5 mg/dL (ref 0.44–1.00)
Creatinine, Ser: 0.68 mg/dL (ref 0.44–1.00)
GFR, Estimated: >60 mL/min (ref >60)
GFR, Estimated: >60 mL/min (ref >60)
Glucose, Bld: 90 mg/dL (ref 70–99)
Glucose, Bld: 87 mg/dL (ref 70–99)
Potassium: 3.3 mmol/L — ABNORMAL LOW (ref 3.5–5.1)
Potassium: 4.2 mmol/L (ref 3.5–5.1)
Sodium: 139 mmol/L (ref 135–145)
Sodium: 139 mmol/L (ref 135–145)
Total Bilirubin: 0.6 mg/dL (ref 0.3–1.2)
Total Bilirubin: 0.4 mg/dL (ref 0.3–1.2)
Total Protein: 6.8 g/dL (ref 6.5–8.1)
Total Protein: 6.7 g/dL (ref 6.5–8.1)

## 2020-12-19 LAB — CBC WITH DIFFERENTIAL/PLATELET
Abs Immature Granulocytes: 0.02 10*3/uL (ref 0.00–0.07)
Basophils Absolute: 0 10*3/uL (ref 0.0–0.1)
Basophils Relative: 0 %
Eosinophils Absolute: 0 10*3/uL (ref 0.0–0.5)
Eosinophils Relative: 0 %
HCT: 41.9 % (ref 36.0–46.0)
Hemoglobin: 13.6 g/dL (ref 12.0–15.0)
Immature Granulocytes: 0 %
Lymphocytes Relative: 21 %
Lymphs Abs: 1.5 10*3/uL (ref 0.7–4.0)
MCH: 28.2 pg (ref 26.0–34.0)
MCHC: 32.5 g/dL (ref 30.0–36.0)
MCV: 86.9 fL (ref 80.0–100.0)
Monocytes Absolute: 0.5 10*3/uL (ref 0.1–1.0)
Monocytes Relative: 7 %
Neutro Abs: 5.2 10*3/uL (ref 1.7–7.7)
Neutrophils Relative %: 72 %
Platelets: 255 10*3/uL (ref 150–400)
RBC: 4.82 MIL/uL (ref 3.87–5.11)
RDW: 13.3 % (ref 11.5–15.5)
WBC: 7.2 10*3/uL (ref 4.0–10.5)
nRBC: 0 % (ref 0.0–0.2)

## 2020-12-19 LAB — MAGNESIUM: Magnesium: 2.1 mg/dL (ref 1.7–2.4)

## 2020-12-19 LAB — I-STAT BETA HCG BLOOD, ED (MC, WL, AP ONLY): I-stat hCG, quantitative: <5 m[IU]/mL (ref <5)

## 2020-12-19 LAB — RAPID URINE DRUG SCREEN, HOSP PERFORMED
Amphetamines: NOT DETECTED
Barbiturates: POSITIVE — AB
Benzodiazepines: NOT DETECTED
Cocaine: NOT DETECTED
Opiates: NOT DETECTED
Tetrahydrocannabinol: NOT DETECTED

## 2020-12-19 LAB — ETHANOL: Alcohol, Ethyl (B): <10 mg/dL (ref <10)

## 2020-12-19 LAB — SARS CORONAVIRUS 2 (TAT 6-24 HRS): SARS Coronavirus 2: NEGATIVE

## 2020-12-19 LAB — SALICYLATE LEVEL
Salicylate Lvl: <7 mg/dL — ABNORMAL LOW (ref 7.0–30.0)
Salicylate Lvl: <7 mg/dL — ABNORMAL LOW (ref 7.0–30.0)

## 2020-12-19 LAB — ACETAMINOPHEN LEVEL
Acetaminophen (Tylenol), Serum: <10 ug/mL — ABNORMAL LOW (ref 10–30)
Acetaminophen (Tylenol), Serum: <10 ug/mL — ABNORMAL LOW (ref 10–30)

## 2020-12-19 MED ORDER — ONDANSETRON HCL 4 MG PO TABS: 4.0000 mg | ORAL_TABLET | Freq: Three times a day (TID) | ORAL | Status: DC | PRN

## 2020-12-19 MED ORDER — POTASSIUM CHLORIDE CRYS ER 20 MEQ PO TBCR
40.0000 meq | EXTENDED_RELEASE_TABLET | Freq: Once | ORAL | Status: AC
  Administered 2020-12-19: 40 meq via ORAL
  Filled 2020-12-19: qty 2

## 2020-12-19 MED ORDER — LACTATED RINGERS IV BOLUS
1000.0000 mL | Freq: Once | INTRAVENOUS | Status: AC
  Administered 2020-12-19: 1000 mL via INTRAVENOUS

## 2020-12-19 MED ORDER — ACETAMINOPHEN 325 MG PO TABS: 650.0000 mg | ORAL_TABLET | ORAL | Status: DC | PRN

## 2020-12-19 MED ORDER — PROCHLORPERAZINE EDISYLATE 10 MG/2ML IJ SOLN
10.0000 mg | Freq: Once | INTRAMUSCULAR | Status: AC
  Administered 2020-12-19: 10 mg via INTRAVENOUS
  Filled 2020-12-19: qty 2

## 2020-12-19 MED ORDER — ALUM & MAG HYDROXIDE-SIMETH 200-200-20 MG/5ML PO SUSP: 30.0000 mL | Freq: Four times a day (QID) | ORAL | Status: DC | PRN

## 2020-12-19 NOTE — ED Triage Notes (Signed)
Per ems, pt from home. Pt took about half a bottle of 200mg  ibuprofen tablets/6000mg  total about of ibuprofen and about 30 of Zyrtec at about 11:30pm last night. Pt reports vomiting x1 before ems arrival, no hematemesis. Pt still reports nausea, rt sided headache and generalized abd pain. No behavioral health hx/attempts. Pt reports this episode is stemming from recent marriage issues. Pt denies SI/HI at this time. Pt tearful in triage.

## 2020-12-19 NOTE — ED Notes (Addendum)
Notice of commitment change faxed to magistrate. Copy placed for medical records. Original placed in folder for magistrate. Completed by Donnald Garre, MD.

## 2020-12-19 NOTE — ED Notes (Signed)
ED Provider at bedside. 

## 2020-12-19 NOTE — ED Notes (Signed)
TTS being performed at bedside with interpreter.

## 2020-12-19 NOTE — ED Notes (Signed)
Dinner Tray Ordered @ 1733. 

## 2020-12-19 NOTE — ED Provider Notes (Signed)
[facility] EMERGENCY DEPARTMENT Provider Note   CSN: [id number] Arrival date & time: 12/19/20  0340   History Chief Complaint  Patient presents with  . Drug Overdose    Maria Riley is a 30 y.o. female.  The history is provided by the patient. A language interpreter was used.  Drug Overdose  She has history of depression comes in after a drug overdose at home.  She says that she had a headache and took a about a bottle full of ibuprofen and cetirizine.  She does admit to suicidal intent.  She does admit to crying spells but denies early morning wakening and anhedonia.  She is currently complaining of a headache, abdominal soreness, nausea.  She denies ethanol consumption and denies any other drug use.  Past Medical History:  Diagnosis Date  . Medical history non-contributory     Patient Active Problem List   Diagnosis Date Noted  . MDD (major depressive disorder), recurrent episode, severe (HCC) 07/08/2018  . DIC (disseminated intravascular coagulation) (HCC) 06/13/2018  . Antepartum placental abruption 06/13/2018  . PPH (postpartum hemorrhage) 06/13/2018  . Language barrier 06/13/2018  . Supervision of high risk pregnancy, antepartum 11/23/2016  . History of preterm delivery 11/23/2016    Past Surgical History:  Procedure Laterality Date  . CESAREAN SECTION N/A 06/13/2018   Procedure: CESAREAN SECTION;  Surgeon: Woodford Bing, MD;  Location: [facility] BIRTHING SUITES;  Service: Obstetrics;  Laterality: N/A;  . MOUTH SURGERY    . WISDOM TOOTH EXTRACTION Bilateral      OB History    Gravida  4   Para  4   Term  0   Preterm  4   AB  0   Living  4     SAB  0   IAB  0   Ectopic  0   Multiple  0   Live Births  4           Family History  Problem Relation Age of Onset  . Healthy Father   . Alcohol abuse Neg Hx   . Arthritis Neg Hx   . Asthma Neg Hx   . Birth defects Neg Hx   . Cancer Neg Hx   . COPD Neg Hx   . Depression Neg  Hx   . Diabetes Neg Hx   . Drug abuse Neg Hx   . Early death Neg Hx   . Hearing loss Neg Hx   . Heart disease Neg Hx   . Hyperlipidemia Neg Hx   . Hypertension Neg Hx   . Kidney disease Neg Hx   . Learning disabilities Neg Hx   . Mental illness Neg Hx   . Mental retardation Neg Hx   . Miscarriages / Stillbirths Neg Hx   . Stroke Neg Hx   . Vision loss Neg Hx   . Varicose Veins Neg Hx     Social History   Tobacco Use  . Smoking status: Never Smoker  . Smokeless tobacco: Never Used  Substance Use Topics  . Alcohol use: No  . Drug use: No    Home Medications Prior to Admission medications   Medication Sig Start Date End Date Taking? Authorizing Provider  acetaminophen (TYLENOL) 500 MG tablet Take 500 mg by mouth every 6 (six) hours as needed.    [provider]  benzonatate (TESSALON) 100 MG capsule Take 1-2 capsules (100-200 mg total) by mouth 3 (three) times daily as needed. 07/06/20   Wallis Bamberg,  PA-C  cetirizine (ZYRTEC) 10 MG tablet Take 1 tablet (10 mg total) by mouth at bedtime. 07/06/20 08/05/20  Wallis Bamberg, PA-C  Prenat-Fe Poly-Methfol-FA-DHA (VITAFOL ULTRA) 29-0.6-0.4-200 MG CAPS Take 1 capsule by mouth at bedtime. 03/06/20   Brock Bad, MD  promethazine-dextromethorphan (PROMETHAZINE-DM) 6.25-15 MG/5ML syrup Take 5 mLs by mouth at bedtime as needed for cough. 07/06/20   Wallis Bamberg, PA-C  pseudoephedrine (SUDAFED) 60 MG tablet Take 1 tablet (60 mg total) by mouth every 8 (eight) hours as needed for congestion. 07/06/20   Wallis Bamberg, PA-C  LO LOESTRIN FE 1 MG-10 MCG / 10 MCG tablet Take 1 tablet by mouth daily. 03/06/20 07/06/20  Brock Bad, MD  medroxyPROGESTERone (DEPO-PROVERA) 150 MG/ML injection Inject 1 mL (150 mg total) into the muscle every 3 (three) months. Patient not taking: Reported on 11/14/2018 07/18/18 07/06/20  Brock Bad, MD    Allergies    Patient has no known allergies.  Review of Systems   Review of Systems  All other  systems reviewed and are negative.   Physical Exam Updated Vital Signs BP 102/67   Pulse 90   Temp 98.2 F (36.8 C) (Oral)   Resp 15   Ht 4\' 11"  (1.499 m)   Wt 56.7 kg   LMP 11/29/2020   SpO2 98%   BMI 25.25 kg/m   Physical Exam Vitals and nursing note reviewed.   30 year old female, resting comfortably and in no acute distress. Vital signs are normal. Oxygen saturation is 98%, which is normal. Head is normocephalic and atraumatic. PERRLA, EOMI. Oropharynx is clear. Neck is nontender and supple without adenopathy or JVD. Back is nontender and there is no CVA tenderness. Lungs are clear without rales, wheezes, or rhonchi. Chest is nontender. Heart has regular rate and rhythm without murmur. Abdomen is soft, flat, nontender without masses or hepatosplenomegaly and peristalsis is hypoactive. Extremities have no cyanosis or edema, full range of motion is present. Skin is warm and dry without rash. Neurologic: Mental status is normal, cranial nerves are intact, there are no motor or sensory deficits. Psychiatric: Tearful, depressed affect.  ED Results / Procedures / Treatments   Labs (all labs ordered are listed, but only abnormal results are displayed) Labs Reviewed  COMPREHENSIVE METABOLIC PANEL - Abnormal; Notable for the following components:      Result Value   Potassium 3.3 (*)    CO2 19 (*)    All other components within normal limits  ACETAMINOPHEN LEVEL - Abnormal; Notable for the following components:   Acetaminophen (Tylenol), Serum <10 (*)    All other components within normal limits  SALICYLATE LEVEL - Abnormal; Notable for the following components:   Salicylate Lvl <7.0 (*)    All other components within normal limits  RAPID URINE DRUG SCREEN, HOSP PERFORMED - Abnormal; Notable for the following components:   Barbiturates POSITIVE (*)    All other components within normal limits  SARS CORONAVIRUS 2 (TAT 6-24 HRS)  ETHANOL  CBC WITH DIFFERENTIAL/PLATELET   I-STAT BETA HCG BLOOD, ED ([facility], [facility], [facility] ONLY)  I-STAT BETA HCG BLOOD, ED ([facility], [facility], [facility] ONLY)    EKG EKG Interpretation  Date/Time:  Thursday December 19 2020 04:00:02 EST Ventricular Rate:  91 PR Interval:    QRS Duration: 80 QT Interval:  349 QTC Calculation: 430 R Axis:   50 Text Interpretation: Sinus rhythm Borderline short PR interval No old tracing to compare Confirmed by [id number] (Dione Booze) on 12/19/2020 4:18:39 AM  Procedures Procedures  CRITICAL CARE Performed by: Dione Booze Total critical care time: 45 minutes Critical care time was exclusive of separately billable procedures and treating other patients. Critical care was necessary to treat or prevent imminent or life-threatening deterioration. Critical care was time spent personally by me on the following activities: development of treatment plan with patient and/or surrogate as well as nursing, discussions with consultants, evaluation of patient's response to treatment, examination of patient, obtaining history from patient or surrogate, ordering and performing treatments and interventions, ordering and review of laboratory studies, ordering and review of radiographic studies, pulse oximetry and re-evaluation of patient's condition.  Medications Ordered in ED Medications  potassium chloride SA (KLOR-CON) CR tablet 40 mEq (has no administration in time range)  acetaminophen (TYLENOL) tablet 650 mg (has no administration in time range)  ondansetron (ZOFRAN) tablet 4 mg (has no administration in time range)  alum & mag hydroxide-simeth (MAALOX/MYLANTA) 200-200-20 MG/5ML suspension 30 mL (has no administration in time range)  lactated ringers bolus 1,000 mL (1,000 mLs Intravenous New Bag/Given 12/19/20 0449)  prochlorperazine (COMPAZINE) injection 10 mg (10 mg Intravenous Given 12/19/20 0450)    ED Course  I have reviewed the triage vital signs and the nursing notes.  Pertinent lab results that were available during my care  of the patient were reviewed by me and considered in my medical decision making (see chart for details).  MDM Rules/Calculators/A&P Intentional overdose with suicidal intent.  Poison control has been consulted and recommend supportive treatment unless she shows evidence of acidosis or renal injury on screening labs.  She is given IV fluids and prochlorperazine.  ECG is normal including normal intervals.  Old records are reviewed, and she does have prior hospitalization for depression.  Labs showed no evidence of alcohol, salicylate, acetaminophen.  There is borderline hypokalemia and she is given a dose of oral potassium.  She is felt to be medically clear at this point and TTS consultation has been requested.  Final Clinical Impression(s) / ED Diagnoses Final diagnoses:  Acute drug overdose, intentional self-harm, initial encounter [facility])    Rx / DC Orders ED Discharge Orders    None       Dione Booze, MD 12/19/20 [phone]

## 2020-12-19 NOTE — ED Notes (Signed)
Pt requesting to leave the hospital. This nurse notified EDP Zavitz; who is now initiating IVC.

## 2020-12-19 NOTE — ED Notes (Signed)
Pt up at nurses station with sitter using the phone.

## 2020-12-19 NOTE — ED Notes (Signed)
Lunch Tray Ordered @ 1022. 

## 2020-12-19 NOTE — ED Notes (Signed)
Pt ambulatory to restroom with standby assistance

## 2020-12-19 NOTE — ED Notes (Signed)
Pt belongings Bag (2) placed in locker 13

## 2020-12-19 NOTE — ED Notes (Signed)
Dr. Preston Fleeting spoke with poison control for recommendations

## 2020-12-19 NOTE — ED Provider Notes (Signed)
Emergency Medicine Observation Re-evaluation Note  Maria Riley is a 30 y.o. female, seen on rounds today.  Pt initially presented to the ED for complaints of Drug Overdose Currently, the patient is discharged  Physical Exam  BP 108/69 (BP Location: Right Arm)   Pulse (!) 108   Temp 98.1 F (36.7 C) (Oral)   Resp 20   Ht 4\' 11"  (1.499 m)   Wt 56.7 kg   LMP 11/29/2020   SpO2 98%   BMI 25.25 kg/m  Physical Exam General: calm Lungs: No distress  ED Course / MDM  EKG:EKG Interpretation  Date/Time:  Thursday December 19 2020 04:00:02 EST Ventricular Rate:  91 PR Interval:    QRS Duration: 80 QT Interval:  349 QTC Calculation: 430 R Axis:   50 Text Interpretation: Sinus rhythm Borderline short PR interval No old tracing to compare Confirmed by 08-21-1991 (Dione Booze) on 12/19/2020 4:18:39 AM    I have reviewed the labs performed to date as well as medications administered while in observation.  Recent changes in the last 24 hours include none  Plan  Current plan is for discharge.  Patient was medically cleared by [facility] providers.  Psychiatry has evaluated patient.  Patient denied any SI, HI, AVH.  She will be staying with family members and has a safety plan and will come to pick up patient Patient is not under full IVC at this time.   Henderly, Britni A, PA-C 12/19/20 1914    12/21/20, MD 12/20/20 [phone]

## 2020-12-19 NOTE — Consult Note (Signed)
Telepsych Consultation   Location of Patient: [facility] Location of Provider: [facility]  Patient Identification: Maria Riley MRN:  [id number] Principal Diagnosis: Overdose Diagnosis:  Principal Problem:   Overdose   Total Time spent with patient: 1 hour  HPI:  Patient seen via telepsych. Chart reviewed. Patient seen with Maria Riley telephone interpreter [phone] Maria Riley. Maria Riley is a 30 year old female with history of an acute stress reaction in 05-05-2018 after the loss of her child. She presented to [facility] early this morning after calling EMS reporting an overdose. On assessment she is calm and cooperative and reports, "I let myself get carried away with my emotions, and I took some pills." She reports taking 15-20 tablets of ibuprofen. Prior notes indicate she took Zyrtec, but patient denies, saying she was confused when reporting that this morning.  Patient reports finding out on January 1 that her husband had been unfaithful. He initially denied this. Yesterday the patient was looking through his old phone and discovered text messages from other women confirming the cheating, as well as nude photographs of women that they both know. She confronted him about this with a screenshot, and he admitted to multiple affairs. She reports sobbing last night and developing a headache from the sobbing. She states she started taking ibuprofen for the headache last night, and continued taking more when the headache did not go away. She denies SI and strongly denies any desire to be dead, stating she wants to live for her three children. Patient reports becoming sick and vomiting, so she called EMS for help.  Patient denies recent depressed mood but admits to feeling upset about her husband's infidelity. Denies recent anhedonia, fatigue, insomnia, hopelessness, or guilt. She states that her husband is in [place] for work the next several days and plans to talk to him when he returns. She presents  future-oriented, stating they may work things out, but if they decide to go separate ways, she feels she can date other people. She states she wants to live for her children regardless of the outcome with her husband. We reviewed the risk she placed for her safety with overdosing, and patient expresses remorse. She strongly denies any SI- "I got through the loss of my child two years ago. I can get through this."   Denies HI/AVH and shows no signs of responding to internal stimuli. Denies history of suicide attempts or depression. She does not feel she needs psychotropic medications. She states she was sent to [facility] two years ago and did not feel it was helpful. She is requesting discharge home, and states that the godmother of her children, Maria Riley, has told her that she could stay with her for the next several days. Per chart review, patient was sent to [facility] after the death of her child in 05/05/18, but discharged on the day of initial evaluation due to appearing stable and denying SI with confirming collateral information. Patient denies following up with mental health services before but reports going to church more often and talking to her priest helped her overcome her grief at that point.  With patient's expressed consent and with assistance of interpreter, collateral information from her friend/the godmother of her children Maria Riley [phone]: Maria Riley reports the patient called her early this morning after the patient had called EMS, and the patient asked her to come get her children so that she could go to the hospital after taking too many pills. Maria Riley denies the patient having history of SI, suicidal behaviors, or depression.  She believes the patient took extra pills in an outburst because she wanted to get her husband's attention. She denies any concern the patient will repeat this behavior. She has spoken with the patient on the phone today and feels she is her normal self. She denies safety concerns for  discharge. She confirms the patient will be staying with her "as long as she needs" and states she will be monitoring her closely over the next few days. She denies patient having access to any firearms or weapons. We discussed safety planning, including locking up any pill bottles in the home due to patient's recent overdose, and Maria Riley stated understanding.  Disposition: Patient with impulsive overdose after discovering her husband's infidelity. She is calm and cooperative on assessment, future-oriented, expressing insight into seriousness of her overdose, and denying SI. Per notes she denied SI on arrival to ED early this morning. She admits to grief over her husband's infidelity but denies recent depression. She is declining psychotropic medication and would be unable to participate in counseling through a behavioral health hospitalization due to being Spanish-speaking only, and is therefore unlikely to benefit from behavioral health hospitalization. She is requesting discharge home. Safety planning done with friend Maria Riley who she will be staying with along with her children, and Maria Riley denies safety concerns for discharge home with her. At this time patient shows no evidence of acute risk of harm to self or others and is psych cleared for discharge.  Past Psychiatric History: See above  Risk to Self:   Risk to Others:   Prior Inpatient Therapy:   Prior Outpatient Therapy:    Past Medical History:  Past Medical History:  Diagnosis Date  . Medical history non-contributory     Past Surgical History:  Procedure Laterality Date  . CESAREAN SECTION N/A 06/13/2018   Procedure: CESAREAN SECTION;  Surgeon:  Bing, MD;  Location: [facility] BIRTHING SUITES;  Service: Obstetrics;  Laterality: N/A;  . MOUTH SURGERY    . WISDOM TOOTH EXTRACTION Bilateral    Family History:  Family History  Problem Relation Age of Onset  . Healthy Father   . Alcohol abuse Neg Hx   . Arthritis Neg Hx   . Asthma Neg  Hx   . Birth defects Neg Hx   . Cancer Neg Hx   . COPD Neg Hx   . Depression Neg Hx   . Diabetes Neg Hx   . Drug abuse Neg Hx   . Early death Neg Hx   . Hearing loss Neg Hx   . Heart disease Neg Hx   . Hyperlipidemia Neg Hx   . Hypertension Neg Hx   . Kidney disease Neg Hx   . Learning disabilities Neg Hx   . Mental illness Neg Hx   . Mental retardation Neg Hx   . Miscarriages / Stillbirths Neg Hx   . Stroke Neg Hx   . Vision loss Neg Hx   . Varicose Veins Neg Hx    Family Psychiatric  History: Denies Social History:  Social History   Substance and Sexual Activity  Alcohol Use No     Social History   Substance and Sexual Activity  Drug Use No    Social History   Socioeconomic History  . Marital status: Single    Spouse name: Not on file  . Number of children: Not on file  . Years of education: Not on file  . Highest education level: Not on file  Occupational History  . Not on  file  Tobacco Use  . Smoking status: Never Smoker  . Smokeless tobacco: Never Used  Substance and Sexual Activity  . Alcohol use: No  . Drug use: No  . Sexual activity: Yes    Birth control/protection: None  Other Topics Concern  . Not on file  Social History Narrative  . Not on file   Social Determinants of Health   Financial Resource Strain: Not on file  Food Insecurity: Not on file  Transportation Needs: Not on file  Physical Activity: Not on file  Stress: Not on file  Social Connections: Not on file   Additional Social History:    Allergies:  No Known Allergies  Labs:  Results for orders placed or performed during the hospital encounter of 12/19/20 (from the past 48 hour(s))  Comprehensive metabolic panel     Status: Abnormal   Collection Time: 12/19/20  4:54 AM  Result Value Ref Range   Sodium 139 135 - 145 mmol/L   Potassium 3.3 (L) 3.5 - 5.1 mmol/L   Chloride 108 98 - 111 mmol/L   CO2 19 (L) 22 - 32 mmol/L   Glucose, Bld 90 70 - 99 mg/dL    Comment: Glucose  reference range applies only to samples taken after fasting for at least 8 hours.   BUN 11 6 - 20 mg/dL   Creatinine, Ser 1.610.50 0.44 - 1.00 mg/dL   Calcium 8.9 8.9 - 09.610.3 mg/dL   Total Protein 6.8 6.5 - 8.1 g/dL   Albumin 3.8 3.5 - 5.0 g/dL   AST 17 15 - 41 U/L   ALT 16 0 - 44 U/L   Alkaline Phosphatase 67 38 - 126 U/L   Total Bilirubin 0.6 0.3 - 1.2 mg/dL   GFR, Estimated >04>60 >54>60 mL/min    Comment: (NOTE) Calculated using the CKD-EPI Creatinine Equation (2021)    Anion gap 12 5 - 15    Comment: Performed at [facility], [address] [place], [place] [address]  Acetaminophen level     Status: Abnormal   Collection Time: 12/19/20  4:54 AM  Result Value Ref Range   Acetaminophen (Tylenol), Serum <10 (L) 10 - 30 ug/mL    Comment: (NOTE) Therapeutic concentrations vary significantly. A range of 10-30 ug/mL  may be an effective concentration for many patients. However, some  are best treated at concentrations outside of this range. Acetaminophen concentrations >150 ug/mL at 4 hours after ingestion  and >50 ug/mL at 12 hours after ingestion are often associated with  toxic reactions.  Performed at [facility], [address] [place], [place] [address]   Salicylate level     Status: Abnormal   Collection Time: 12/19/20  4:54 AM  Result Value Ref Range   Salicylate Lvl <7.0 (L) 7.0 - 30.0 mg/dL    Comment: Performed at [facility], [address] [place], [place] [address]  Ethanol     Status: None   Collection Time: 12/19/20  4:54 AM  Result Value Ref Range   Alcohol, Ethyl (B) <10 <10 mg/dL    Comment: (NOTE) Lowest detectable limit for serum alcohol is 10 mg/dL.  For medical purposes only. Performed at [facility], [address] [place], [place] [address]   CBC with Differential     Status: None   Collection Time: 12/19/20  4:54 AM  Result Value Ref Range   WBC 7.2 4.0 - 10.5 K/uL   RBC 4.82 3.87 - 5.11  MIL/uL   Hemoglobin 13.6 12.0 -  15.0 g/dL   HCT 28.3 15.1 - 76.1 %   MCV 86.9 80.0 - 100.0 fL   MCH 28.2 26.0 - 34.0 pg   MCHC 32.5 30.0 - 36.0 g/dL   RDW 60.7 37.1 - 06.2 %   Platelets 255 150 - 400 K/uL   nRBC 0.0 0.0 - 0.2 %   Neutrophils Relative % 72 %   Neutro Abs 5.2 1.7 - 7.7 K/uL   Lymphocytes Relative 21 %   Lymphs Abs 1.5 0.7 - 4.0 K/uL   Monocytes Relative 7 %   Monocytes Absolute 0.5 0.1 - 1.0 K/uL   Eosinophils Relative 0 %   Eosinophils Absolute 0.0 0.0 - 0.5 K/uL   Basophils Relative 0 %   Basophils Absolute 0.0 0.0 - 0.1 K/uL   Immature Granulocytes 0 %   Abs Immature Granulocytes 0.02 0.00 - 0.07 K/uL    Comment: Performed at [facility], [address] [place], [place] [address]  SARS CORONAVIRUS 2 (TAT 6-24 HRS) Nasopharyngeal Nasopharyngeal Swab     Status: None   Collection Time: 12/19/20  4:54 AM   Specimen: Nasopharyngeal Swab  Result Value Ref Range   SARS Coronavirus 2 NEGATIVE NEGATIVE    Comment: (NOTE) SARS-CoV-2 target nucleic acids are NOT DETECTED.  The SARS-CoV-2 RNA is generally detectable in upper and lower respiratory specimens during the acute phase of infection. Negative results do not preclude SARS-CoV-2 infection, do not rule out co-infections with other pathogens, and should not be used as the sole basis for treatment or other patient management decisions. Negative results must be combined with clinical observations, patient history, and epidemiological information. The expected result is Negative.  Fact Sheet for Patients: HairSlick.no  Fact Sheet for Healthcare Providers: quierodirigir.com  This test is not yet approved or cleared by the [place] FDA and  has been authorized for detection and/or diagnosis of SARS-CoV-2 by FDA under an Emergency Use Authorization (EUA). This EUA will remain  in effect (meaning this test can be used) for the duration of the COVID-19 declaration under Se ction  564(b)(1) of the Act, 21 U.S.C. section 360bbb-3(b)(1), unless the authorization is terminated or revoked sooner.  Performed at [facility], [address], [place], [place] [address]   I-Stat beta hCG blood, ED     Status: None   Collection Time: 12/19/20  5:02 AM  Result Value Ref Range   I-stat hCG, quantitative <5.0 <5 mIU/mL   Comment 3            Comment:   GEST. AGE      CONC.  (mIU/mL)   <=1 WEEK        5 - 50     2 WEEKS       50 - 500     3 WEEKS       100 - 10,000     4 WEEKS     1,000 - 30,000        FEMALE AND NON-PREGNANT FEMALE:     LESS THAN 5 mIU/mL   Urine rapid drug screen (hosp performed)     Status: Abnormal   Collection Time: 12/19/20  5:07 AM  Result Value Ref Range   Opiates NONE DETECTED NONE DETECTED   Cocaine NONE DETECTED NONE DETECTED   Benzodiazepines NONE DETECTED NONE DETECTED   Amphetamines NONE DETECTED NONE DETECTED   Tetrahydrocannabinol NONE DETECTED NONE DETECTED   Barbiturates POSITIVE (  A) NONE DETECTED    Comment: (NOTE) DRUG SCREEN FOR MEDICAL PURPOSES ONLY.  IF CONFIRMATION IS NEEDED FOR ANY PURPOSE, NOTIFY LAB WITHIN 5 DAYS.  LOWEST DETECTABLE LIMITS FOR URINE DRUG SCREEN Drug Class                     Cutoff (ng/mL) Amphetamine and metabolites    1000 Barbiturate and metabolites    200 Benzodiazepine                 200 Tricyclics and metabolites     300 Opiates and metabolites        300 Cocaine and metabolites        300 THC                            50 Performed at [facility], [address] [place], [place] [address]   Magnesium     Status: None   Collection Time: 12/19/20  4:58 PM  Result Value Ref Range   Magnesium 2.1 1.7 - 2.4 mg/dL    Comment: Performed at [facility], [address] [place], [place] [address]  Acetaminophen level     Status: Abnormal   Collection Time: 12/19/20  4:58 PM  Result Value Ref Range   Acetaminophen (Tylenol), Serum <10 (L) 10 - 30 ug/mL    Comment:  (NOTE) Therapeutic concentrations vary significantly. A range of 10-30 ug/mL  may be an effective concentration for many patients. However, some  are best treated at concentrations outside of this range. Acetaminophen concentrations >150 ug/mL at 4 hours after ingestion  and >50 ug/mL at 12 hours after ingestion are often associated with  toxic reactions.  Performed at [facility], [address] [place], [place] [address]   Salicylate level     Status: Abnormal   Collection Time: 12/19/20  4:58 PM  Result Value Ref Range   Salicylate Lvl <7.0 (L) 7.0 - 30.0 mg/dL    Comment: Performed at [facility], [address] [place], [place] [address]  Comprehensive metabolic panel     Status: Abnormal   Collection Time: 12/19/20  4:58 PM  Result Value Ref Range   Sodium 139 135 - 145 mmol/L   Potassium 4.2 3.5 - 5.1 mmol/L    Comment: NO VISIBLE HEMOLYSIS   Chloride 110 98 - 111 mmol/L   CO2 19 (L) 22 - 32 mmol/L   Glucose, Bld 87 70 - 99 mg/dL    Comment: Glucose reference range applies only to samples taken after fasting for at least 8 hours.   BUN 10 6 - 20 mg/dL   Creatinine, Ser 2.950.68 0.44 - 1.00 mg/dL   Calcium 9.2 8.9 - 62.110.3 mg/dL   Total Protein 6.7 6.5 - 8.1 g/dL   Albumin 3.7 3.5 - 5.0 g/dL   AST 15 15 - 41 U/L   ALT 15 0 - 44 U/L   Alkaline Phosphatase 62 38 - 126 U/L   Total Bilirubin 0.4 0.3 - 1.2 mg/dL   GFR, Estimated >30>60 >86>60 mL/min    Comment: (NOTE) Calculated using the CKD-EPI Creatinine Equation (2021)    Anion gap 10 5 - 15    Comment: Performed at [facility], [address] [place], [place] [address]    Medications:  Current Facility-Administered Medications  Medication Dose Route Frequency Provider Last Rate Last Admin  .  acetaminophen (TYLENOL) tablet 650 mg  650 mg Oral Q4H PRN Dione Booze, MD      . alum & mag hydroxide-simeth (MAALOX/MYLANTA) 200-200-20 MG/5ML suspension 30 mL  30 mL Oral Q6H PRN Dione Booze, MD      .  ondansetron [facility]) tablet 4 mg  4 mg Oral Q8H PRN Dione Booze, MD       Current Outpatient Medications  Medication Sig Dispense Refill  . benzonatate (TESSALON) 100 MG capsule Take 1-2 capsules (100-200 mg total) by mouth 3 (three) times daily as needed. (Patient not taking: No sig reported) 60 capsule 0  . cetirizine (ZYRTEC) 10 MG tablet Take 1 tablet (10 mg total) by mouth at bedtime. (Patient not taking: Reported on 12/19/2020) 30 tablet 0  . Prenat-Fe Poly-Methfol-FA-DHA (VITAFOL ULTRA) 29-0.6-0.4-200 MG CAPS Take 1 capsule by mouth at bedtime. (Patient not taking: No sig reported) 30 capsule 11  . promethazine-dextromethorphan (PROMETHAZINE-DM) 6.25-15 MG/5ML syrup Take 5 mLs by mouth at bedtime as needed for cough. (Patient not taking: No sig reported) 100 mL 0  . pseudoephedrine (SUDAFED) 60 MG tablet Take 1 tablet (60 mg total) by mouth every 8 (eight) hours as needed for congestion. (Patient not taking: No sig reported) 30 tablet 0    Psychiatric Specialty Exam: Physical Exam  Review of Systems  Blood pressure 108/69, pulse (!) 108, temperature 98.1 F (36.7 C), temperature source Oral, resp. rate 20, height 4\' 11"  (1.499 m), weight 56.7 kg, last menstrual period 11/29/2020, SpO2 98 %.Body mass index is 25.25 kg/m.  General Appearance: Casual  Eye Contact:  Good  Speech:  Normal Rate  Volume:  Normal  Mood:  Euthymic  Affect:  Appropriate and Congruent  Thought Process:  Coherent and Goal Directed  Orientation:  Full (Time, Place, and Person)  Thought Content:  Logical  Suicidal Thoughts:  No  Homicidal Thoughts:  No  Memory:  Immediate;   Good Recent;   Good Remote;   Good  Judgement:  Good  Insight:  Good  Psychomotor Activity:  Normal  Concentration:  Concentration: Good and Attention Span: Good  Recall:  Good  Fund of Knowledge:  Fair  Language:  Good  Akathisia:  No  Handed:  Right  AIMS (if indicated):     Assets:  Communication Skills Desire for  Improvement Financial Resources/Insurance Housing Physical Health Resilience Social Support  ADL's:  Intact  Cognition:  WNL  Sleep:       Disposition: Patient with impulsive overdose after discovering her husband's infidelity. She is calm and cooperative on assessment, future-oriented, expressing insight into seriousness of her overdose, and denying SI. Per notes she denied SI on arrival to ED early this morning. She admits to grief over her husband's infidelity but denies recent depression. She is declining psychotropic medication and would be unable to participate in counseling through a behavioral health hospitalization due to being Spanish-speaking only, and is therefore unlikely to benefit from behavioral health hospitalization. She is requesting discharge home. Safety planning done with friend 12/01/2020 who she will be staying with along with her children, and Maria Riley denies safety concerns for discharge home with her. At this time patient shows no evidence of acute risk of harm to self or others and is psych cleared for discharge.  This service was provided via telemedicine using a 2-way, interactive audio and video technology with the identified patient and this Maria Riley.  Clinical research associate, NP 12/19/2020 7:02 PM

## 2020-12-19 NOTE — ED Provider Notes (Signed)
Emergency Medicine Observation Re-evaluation Note  Maria Riley is a 30 y.o. female, seen on rounds today.  Pt initially presented to the ED for complaints of Drug Overdose Currently, the patient is sitting comfortably in bed.  Physical Exam  BP 108/69 (BP Location: Right Arm)   Pulse (!) 108   Temp 98.1 F (36.7 C) (Oral)   Resp 20   Ht 4\' 11"  (1.499 m)   Wt 56.7 kg   LMP 11/29/2020   SpO2 98%   BMI 25.25 kg/m  Physical Exam General: Alert oriented well-appearing no acute distress Skin: Warm dry Lungs: Regular unlabored Psych: Pleasant cooperative  ED Course / MDM  EKG:EKG Interpretation  Date/Time:  Thursday December 19 2020 04:00:02 EST Ventricular Rate:  91 PR Interval:    QRS Duration: 80 QT Interval:  349 QTC Calculation: 430 R Axis:   50 Text Interpretation: Sinus rhythm Borderline short PR interval No old tracing to compare Confirmed by 08-21-1991 (Dione Booze) on 12/19/2020 4:18:39 AM    I have reviewed the labs performed to date as well as medications administered while in observation.  Recent changes in the last 24 hours include CBC within normal limits, no leukocytosis or anemia.  CMP shows no emergent electrolyte derangement, AKI, LFT elevations or gap.  Tylenol and salicylate levels were negative.  Ethanol was negative.  COVID was negative.  UDS showed barbiturates.  Pregnancy test was negative.  Vital signs taken this morning showed mild tachycardia 108 bpm, afebrile at 98.1 F, blood pressure stable 108/69, respirations 20, SPO2 98% on room air  Plan  30 year old female arrived after intentional overdose on ibuprofen and cetirizine.  Patient was treated with IV fluids and prochlorperazine.  Labs and EKG were reassuring, patient was medically cleared for TTS evaluation.  I received a message from nursing staff that poison control called back and asked that repeat electrolytes be obtained.  I have ordered CMP, Tylenol level, salicylate level and magnesium. - I  reassessed the patient she is well-appearing no acute distress sitting up she denies any pain or concerns reports she is feeling good.  Plan of care is follow-up on salicylate, magnesium, Tylenol and CMP.  Pending no emergent findings patient to be medically cleared. ------------------------ 6:26 PM: Repeat salicylate and Tylenol levels negative.  Magnesium within normal limits.  Repeat CMP shows no emergent electrolyte derangement, AKI, LFT elevations or gap.  At this time there does not appear to be any evidence of an acute emergency medical condition and the patient appears stable for psychiatric disposition.  Note: Portions of this report may have been transcribed using voice recognition software. Every effort was made to ensure accuracy; however, inadvertent computerized transcription errors may still be present.   26 12/19/20 12/21/20    Silva Bandy, MD 12/20/20 [phone]

## 2020-12-19 NOTE — ED Notes (Addendum)
Poison control called for update on pt. No follow up labs ordered at this time to give further update. Default provider [place], [place] notified of poison control recommendations.

## 2020-12-20 ENCOUNTER — Telehealth: Payer: Self-pay

## 2020-12-20 NOTE — Telephone Encounter (Signed)
Transition Care Management Unsuccessful Follow-up Telephone Call  Date of discharge and from where:  12/19/2020 [facility]  Attempts:  1st Attempt  Reason for unsuccessful TCM follow-up call:  No answer/busy

## 2020-12-23 NOTE — Telephone Encounter (Signed)
Transition Care Management Unsuccessful Follow-up Telephone Call  Date of discharge and from where:  12/19/2020 [facility]  Attempts:  2nd Attempt  Reason for unsuccessful TCM follow-up call:  Unable to reach patient

## 2020-12-24 NOTE — Telephone Encounter (Signed)
Transition Care Management Unsuccessful Follow-up Telephone Call  Date of discharge and from where:  12/19/2020 [facility]  Attempts:  3rd Attempt  Reason for unsuccessful TCM follow-up call:  Unable to reach patient

## 2021-01-02 ENCOUNTER — Encounter (HOSPITAL_COMMUNITY): Payer: Self-pay

## 2021-01-02 ENCOUNTER — Ambulatory Visit (HOSPITAL_COMMUNITY)
Admission: EM | Admit: 2021-01-02 | Discharge: 2021-01-02 | Disposition: A | Discharge status: Home / Self Care | Payer: Medicaid Other | Attending: Emergency Medicine | Admitting: Emergency Medicine

## 2021-01-02 DIAGNOSIS — Z113 Encounter for screening for infections with a predominantly sexual mode of transmission: Secondary | ICD-10-CM | POA: Insufficient documentation

## 2021-01-02 DIAGNOSIS — J302 Other seasonal allergic rhinitis: Secondary | ICD-10-CM | POA: Diagnosis not present

## 2021-01-02 DIAGNOSIS — J029 Acute pharyngitis, unspecified: Secondary | ICD-10-CM

## 2021-01-02 DIAGNOSIS — H9203 Otalgia, bilateral: Secondary | ICD-10-CM | POA: Diagnosis not present

## 2021-01-02 LAB — HIV ANTIBODY (ROUTINE TESTING W REFLEX): HIV Screen 4th Generation wRfx: NONREACTIVE

## 2021-01-02 MED ORDER — FLUTICASONE PROPIONATE 50 MCG/ACT NA SUSP: 1.0000 | Freq: Every day | NASAL | 0 refills | Status: DC

## 2021-01-02 MED ORDER — CETIRIZINE HCL 10 MG PO TABS: 10.0000 mg | ORAL_TABLET | Freq: Every day | ORAL | 0 refills | Status: DC

## 2021-01-02 NOTE — ED Provider Notes (Signed)
[facility]    CSN: [id number] Arrival date & time: 01/02/21  7322      History   Chief Complaint Chief Complaint  Patient presents with  . Sore Throat  . Otalgia    HPI Maria Riley is a 30 y.o. female.   [name] presents with complaints of bilateral ears "itching" as well as sore throat which started two days ago. No headache, no fevers, no gi symptoms, no cough. Maybe slight congestion. Tylenol has not helped. Also requesting std screen. Denies any current vaginal symptoms and any known std exposures. LMP 1/22.    ROS per HPI, negative if not otherwise mentioned.      Past Medical History:  Diagnosis Date  . Medical history non-contributory     Patient Active Problem List   Diagnosis Date Noted  . Overdose 12/19/2020  . MDD (major depressive disorder), recurrent episode, severe (HCC) 07/08/2018  . DIC (disseminated intravascular coagulation) (HCC) 06/13/2018  . Antepartum placental abruption 06/13/2018  . PPH (postpartum hemorrhage) 06/13/2018  . Language barrier 06/13/2018  . Supervision of high risk pregnancy, antepartum 11/23/2016  . History of preterm delivery 11/23/2016    Past Surgical History:  Procedure Laterality Date  . CESAREAN SECTION N/A 06/13/2018   Procedure: CESAREAN SECTION;  Surgeon: [place] Bing, MD;  Location: [facility] BIRTHING SUIT[facility];  Service: Obstetrics;  Laterality: N/A;  . MOUTH SURGERY    . WISDOM TOOTH EXTRACTION Bilateral     OB History    Gravida  4   Para  4   Term  0   Preterm  4   AB  0   Living  4     SAB  0   IAB  0   Ectopic  0   Multiple  0   Live Births  4            Home Medications    Prior to Admission medications   Medication Sig Start Date End Date Taking? Authorizing Provider  fluticasone (FLONASE) 50 MCG/ACT nasal spray Place 1 spray into both nostrils daily. 01/02/21  Yes Evertte Sones, Dorene Grebe B, NP  benzonatate (TESSALON) 100 MG capsule Take 1-2 capsules (100-200 mg  total) by mouth 3 (three) times daily as needed. Patient not taking: No sig reported 07/06/20   Wallis Bamberg, PA-C  cetirizine (ZYRTEC) 10 MG tablet Take 1 tablet (10 mg total) by mouth daily. 01/02/21 02/01/21  Georgetta Haber, NP  Prenat-Fe Poly-Methfol-FA-DHA (VITAFOL ULTRA) 29-0.6-0.4-200 MG CAPS Take 1 capsule by mouth at bedtime. Patient not taking: No sig reported 03/06/20   Brock Bad, MD  promethazine-dextromethorphan (PROMETHAZINE-DM) 6.25-15 MG/5ML syrup Take 5 mLs by mouth at bedtime as needed for cough. Patient not taking: No sig reported 07/06/20   Wallis Bamberg, PA-C  pseudoephedrine (SUDAFED) 60 MG tablet Take 1 tablet (60 mg total) by mouth every 8 (eight) hours as needed for congestion. Patient not taking: No sig reported 07/06/20   Wallis Bamberg, PA-C  LO LOESTRIN FE 1 MG-10 MCG / 10 MCG tablet Take 1 tablet by mouth daily. 03/06/20 07/06/20  Brock Bad, MD  medroxyPROGESTERone (DEPO-PROVERA) 150 MG/ML injection Inject 1 mL (150 mg total) into the muscle every 3 (three) months. Patient not taking: Reported on 11/14/2018 07/18/18 07/06/20  Brock Bad, MD    Family History Family History  Problem Relation Age of Onset  . Healthy Father   . Alcohol abuse Neg Hx   . Arthritis Neg Hx   .  Asthma Neg Hx   . Birth defects Neg Hx   . Cancer Neg Hx   . COPD Neg Hx   . Depression Neg Hx   . Diabetes Neg Hx   . Drug abuse Neg Hx   . Early death Neg Hx   . Hearing loss Neg Hx   . Heart disease Neg Hx   . Hyperlipidemia Neg Hx   . Hypertension Neg Hx   . Kidney disease Neg Hx   . Learning disabilities Neg Hx   . Mental illness Neg Hx   . Mental retardation Neg Hx   . Miscarriages / Stillbirths Neg Hx   . Stroke Neg Hx   . Vision loss Neg Hx   . Varicose Veins Neg Hx     Social History Social History   Tobacco Use  . Smoking status: Never Smoker  . Smokeless tobacco: Never Used  Substance Use Topics  . Alcohol use: No  . Drug use: No     Allergies    Patient has no known allergies.   Review of Systems Review of Systems   Physical Exam Triage Vital Signs ED Triage Vitals  Enc Vitals Group     BP 01/02/21 0928 111/69     Pulse Rate 01/02/21 0928 72     Resp 01/02/21 0928 16     Temp 01/02/21 0928 98.7 F (37.1 C)     Temp src --      SpO2 01/02/21 0928 99 %     Weight --      Height --      Head Circumference --      Peak Flow --      Pain Score 01/02/21 0924 6     Pain Loc --      Pain Edu? --      Excl. in GC? --    No data found.  Updated Vital Signs BP 111/69 (BP Location: Right Arm)   Pulse 72   Temp 98.7 F (37.1 C)   Resp 16   LMP 12/28/2020   SpO2 99%   Visual Acuity Right Eye Distance:   Left Eye Distance:   Bilateral Distance:    Right Eye Near:   Left Eye Near:    Bilateral Near:     Physical Exam Constitutional:      General: She is not in acute distress.    Appearance: She is well-developed.  HENT:     Right Ear: Tympanic membrane and ear canal normal.     Left Ear: Tympanic membrane and ear canal normal.     Nose: No congestion.     Mouth/Throat:     Mouth: Mucous membranes are moist.     Tonsils: No tonsillar exudate. 1+ on the right. 1+ on the left.  Cardiovascular:     Rate and Rhythm: Normal rate.  Pulmonary:     Effort: Pulmonary effort is normal.  Abdominal:     Palpations: Abdomen is not rigid.     Tenderness: There is no abdominal tenderness. There is no guarding or rebound.  Genitourinary:    Comments: Denies sores, lesions, vaginal bleeding; no pelvic pain; gu exam deferred at this time, vaginal self swab collected.   Lymphadenopathy:     Cervical: No cervical adenopathy.  Skin:    General: Skin is warm and dry.  Neurological:     Mental Status: She is alert and oriented to person, place, and time.      UC Treatments /  Results  Labs (all labs ordered are listed, but only abnormal results are displayed) Labs Reviewed  CERVICOVAGINAL ANCILLARY ONLY     EKG   Radiology No results found.  Procedures Procedures (including critical care time)  Medications Ordered in UC Medications - No data to display  Initial Impression / Assessment and Plan / UC Course  I have reviewed the triage vital signs and the nursing notes.  Pertinent labs & imaging results that were available during my care of the patient were reviewed by me and considered in my medical decision making (see chart for details).     Non toxic. Benign physical exam. flonase and zyrtec recommended at this time. Vaginal cytology collected and pending for std screen. Return precautions provided. Patient verbalized understanding and agreeable to plan.   Final Clinical Impressions(s) / UC Diagnoses   Final diagnoses:  Otalgia of both ears  Pharyngitis, unspecified etiology  Screen for STD (sexually transmitted disease)     Discharge Instructions     Daily flonase as well as daily zyrtec to try to help with your symptoms.  We will notify of you any positive findings from  your vaginal testing, or if any changes to treatment are needed. If normal or otherwise without concern to your results, we will not call you. Please log on to your MyChart to review your results if interested in so.     ED Prescriptions    Medication Sig Dispense Auth. Provider   cetirizine (ZYRTEC) 10 MG tablet Take 1 tablet (10 mg total) by mouth daily. 30 tablet Linus Mako B, NP   fluticasone (FLONASE) 50 MCG/ACT nasal spray Place 1 spray into both nostrils daily. 16 g Georgetta Haber, NP     PDMP not reviewed this encounter.   Georgetta Haber, NP 01/02/21 [phone]

## 2021-01-02 NOTE — ED Triage Notes (Signed)
Patient presents to Urgent Care with complaints of bilateral ear discomfort and sore throat since yesterday. Pt concerned with possible STDs requesting testing.   Denies fever, N/V, or diarrhea.

## 2021-01-02 NOTE — Discharge Instructions (Signed)
Daily flonase as well as daily zyrtec to try to help with your symptoms.  We will notify of you any positive findings from  your vaginal testing, or if any changes to treatment are needed. If normal or otherwise without concern to your results, we will not call you. Please log on to your MyChart to review your results if interested in so.

## 2021-01-03 LAB — RPR: RPR Ser Ql: NONREACTIVE

## 2021-01-03 LAB — CERVICOVAGINAL ANCILLARY ONLY
Bacterial Vaginitis (gardnerella): POSITIVE — AB
Candida Glabrata: NEGATIVE
Candida Vaginitis: POSITIVE — AB
Chlamydia: NEGATIVE
Comment: NEGATIVE
Comment: NEGATIVE
Comment: NEGATIVE
Comment: NEGATIVE
Comment: NEGATIVE
Comment: NORMAL
Neisseria Gonorrhea: NEGATIVE
Trichomonas: NEGATIVE

## 2021-01-06 ENCOUNTER — Telehealth (HOSPITAL_COMMUNITY): Payer: Self-pay | Admitting: Emergency Medicine

## 2021-01-06 MED ORDER — FLUCONAZOLE 150 MG PO TABS: 150.0000 mg | ORAL_TABLET | Freq: Once | ORAL | 0 refills | Status: AC

## 2021-01-06 MED ORDER — METRONIDAZOLE 500 MG PO TABS: 500.0000 mg | ORAL_TABLET | Freq: Two times a day (BID) | ORAL | 0 refills | Status: DC

## 2021-03-31 ENCOUNTER — Encounter (HOSPITAL_COMMUNITY): Payer: Self-pay

## 2021-03-31 ENCOUNTER — Ambulatory Visit (HOSPITAL_COMMUNITY)
Admission: EM | Admit: 2021-03-31 | Discharge: 2021-03-31 | Disposition: A | Discharge status: Home / Self Care | Payer: Medicaid Other | Attending: Emergency Medicine | Admitting: Emergency Medicine

## 2021-03-31 DIAGNOSIS — N760 Acute vaginitis: Secondary | ICD-10-CM | POA: Diagnosis not present

## 2021-03-31 DIAGNOSIS — Z3201 Encounter for pregnancy test, result positive: Secondary | ICD-10-CM | POA: Diagnosis not present

## 2021-03-31 LAB — POCT URINALYSIS DIPSTICK, ED / UC
Bilirubin Urine: NEGATIVE
Glucose, UA: NEGATIVE mg/dL
Hgb urine dipstick: NEGATIVE
Ketones, ur: NEGATIVE mg/dL
Leukocytes,Ua: NEGATIVE
Nitrite: NEGATIVE
Protein, ur: NEGATIVE mg/dL
Specific Gravity, Urine: 1.02 (ref 1.005–1.030)
Urobilinogen, UA: 1 mg/dL (ref 0.0–1.0)
pH: 7 (ref 5.0–8.0)

## 2021-03-31 LAB — POC URINE PREG, ED: Preg Test, Ur: POSITIVE — AB

## 2021-03-31 MED ORDER — PRENATAL COMPLETE 14-0.4 MG PO TABS: 1.0000 | ORAL_TABLET | Freq: Every day | ORAL | 0 refills | Status: DC

## 2021-03-31 MED ORDER — CLOTRIMAZOLE 1 % EX CREA: TOPICAL_CREAM | CUTANEOUS | 0 refills | Status: DC

## 2021-03-31 NOTE — Discharge Instructions (Addendum)
Your pregnancy test was positive.  Take the prenatal vitamin once a day and follow up with OB/GYN.    Use the lotrimin cream as needed for vaginal itching and irritation.   Return or go to the Emergency Department if symptoms worsen or do not improve in the next few days.

## 2021-03-31 NOTE — ED Provider Notes (Signed)
[facility]    CSN: [id number] Arrival date & time: 03/31/21  7408      History   Chief Complaint Chief Complaint  Patient presents with  . Vaginitis    HPI Maria Riley is a 30 y.o. female.   Patient here for evaluation of dyusria and vaginal itching.  Also reports having a white discharge several days ago that has since resolved.  Reports bladder fulls and burning with urination.  Denies any OTC medication or treatments.  Reports last menstrual period was around 3/20.  Denies any concern or exposure to STI.  Denies any trauma, injury, or other precipitating event.  Denies any specific alleviating or aggravating factors.  Denies any fevers, chest pain, shortness of breath, N/V/D, numbness, tingling, weakness, abdominal pain, or headaches.    ROS: As per HPI, all other pertinent ROS negative   The history is provided by the patient. The history is limited by a language barrier. A language interpreter was used.    Past Medical History:  Diagnosis Date  . Medical history non-contributory     Patient Active Problem List   Diagnosis Date Noted  . Overdose 12/19/2020  . MDD (major depressive disorder), recurrent episode, severe (HCC) 07/08/2018  . DIC (disseminated intravascular coagulation) (HCC) 06/13/2018  . Antepartum placental abruption 06/13/2018  . PPH (postpartum hemorrhage) 06/13/2018  . Language barrier 06/13/2018  . Supervision of high risk pregnancy, antepartum 11/23/2016  . History of preterm delivery 11/23/2016    Past Surgical History:  Procedure Laterality Date  . CESAREAN SECTION N/A 06/13/2018   Procedure: CESAREAN SECTION;  Surgeon: [place] Bing, MD;  Location: [facility];  Service: Obstetrics;  Laterality: N/A;  . MOUTH SURGERY    . WISDOM TOOTH EXTRACTION Bilateral     OB History    Gravida  4   Para  4   Term  0   Preterm  4   AB  0   Living  4     SAB  0   IAB  0   Ectopic  0   Multiple  0   Live Births   4            Home Medications    Prior to Admission medications   Medication Sig Start Date End Date Taking? Authorizing Provider  clotrimazole (LOTRIMIN) 1 % cream Apply to affected area daily as needed 03/31/21  Yes Ivette Loyal, NP  Prenatal Vit-Fe Fumarate-FA (PRENATAL COMPLETE) 14-0.4 MG TABS Take 1 tablet by mouth daily. 03/31/21  Yes Ivette Loyal, NP  benzonatate (TESSALON) 100 MG capsule Take 1-2 capsules (100-200 mg total) by mouth 3 (three) times daily as needed. Patient not taking: No sig reported 07/06/20   Wallis Bamberg, PA-C  cetirizine (ZYRTEC) 10 MG tablet Take 1 tablet (10 mg total) by mouth daily. 01/02/21 02/01/21  Georgetta Haber, NP  fluticasone (FLONASE) 50 MCG/ACT nasal spray Place 1 spray into both nostrils daily. 01/02/21   Georgetta Haber, NP  metroNIDAZOLE (FLAGYL) 500 MG tablet Take 1 tablet (500 mg total) by mouth 2 (two) times daily. 01/06/21   Lamptey, Britta Mccreedy, MD  Prenat-Fe Poly-Methfol-FA-DHA (VITAFOL ULTRA) 29-0.6-0.4-200 MG CAPS Take 1 capsule by mouth at bedtime. Patient not taking: No sig reported 03/06/20   Brock Bad, MD  promethazine-dextromethorphan (PROMETHAZINE-DM) 6.25-15 MG/5ML syrup Take 5 mLs by mouth at bedtime as needed for cough. Patient not taking: No sig reported 07/06/20   Wallis Bamberg, PA-C  pseudoephedrine (  SUDAFED) 60 MG tablet Take 1 tablet (60 mg total) by mouth every 8 (eight) hours as needed for congestion. Patient not taking: No sig reported 07/06/20   Wallis Bamberg, PA-C  LO LOESTRIN FE 1 MG-10 MCG / 10 MCG tablet Take 1 tablet by mouth daily. 03/06/20 07/06/20  Brock Bad, MD  medroxyPROGESTERone (DEPO-PROVERA) 150 MG/ML injection Inject 1 mL (150 mg total) into the muscle every 3 (three) months. Patient not taking: Reported on 11/14/2018 07/18/18 07/06/20  Brock Bad, MD    Family History Family History  Problem Relation Age of Onset  . Healthy Father   . Alcohol abuse Neg Hx   . Arthritis Neg Hx   . Asthma  Neg Hx   . Birth defects Neg Hx   . Cancer Neg Hx   . COPD Neg Hx   . Depression Neg Hx   . Diabetes Neg Hx   . Drug abuse Neg Hx   . Early death Neg Hx   . Hearing loss Neg Hx   . Heart disease Neg Hx   . Hyperlipidemia Neg Hx   . Hypertension Neg Hx   . Kidney disease Neg Hx   . Learning disabilities Neg Hx   . Mental illness Neg Hx   . Mental retardation Neg Hx   . Miscarriages / Stillbirths Neg Hx   . Stroke Neg Hx   . Vision loss Neg Hx   . Varicose Veins Neg Hx     Social History Social History   Tobacco Use  . Smoking status: Never Smoker  . Smokeless tobacco: Never Used  Substance Use Topics  . Alcohol use: No  . Drug use: No     Allergies   Patient has no known allergies.   Review of Systems Review of Systems  Genitourinary: Positive for dysuria and vaginal discharge.  All other systems reviewed and are negative.    Physical Exam Triage Vital Signs ED Triage Vitals  Enc Vitals Group     BP 03/31/21 0937 97/68     Pulse Rate 03/31/21 0937 82     Resp 03/31/21 0937 18     Temp 03/31/21 0937 98.7 F (37.1 C)     Temp Source 03/31/21 0937 Oral     SpO2 03/31/21 0937 100 %     Weight --      Height --      Head Circumference --      Peak Flow --      Pain Score 03/31/21 0943 1     Pain Loc --      Pain Edu? --      Excl. in GC? --    No data found.  Updated Vital Signs BP 97/68 (BP Location: Right Arm)   Pulse 82   Temp 98.7 F (37.1 C) (Oral)   Resp 18   LMP 02/26/2021   SpO2 100%   Visual Acuity Right Eye Distance:   Left Eye Distance:   Bilateral Distance:    Right Eye Near:   Left Eye Near:    Bilateral Near:     Physical Exam Vitals and nursing note reviewed.  Constitutional:      General: She is not in acute distress.    Appearance: Normal appearance. She is not ill-appearing, toxic-appearing or diaphoretic.  HENT:     Head: Normocephalic and atraumatic.  Eyes:     Conjunctiva/sclera: Conjunctivae normal.   Cardiovascular:     Rate and Rhythm: Normal rate.  Pulses: Normal pulses.  Pulmonary:     Effort: Pulmonary effort is normal.  Abdominal:     General: Abdomen is flat.  Musculoskeletal:        General: Normal range of motion.     Cervical back: Normal range of motion.  Skin:    General: Skin is warm and dry.  Neurological:     General: No focal deficit present.     Mental Status: She is alert and oriented to person, place, and time.  Psychiatric:        Mood and Affect: Mood normal.      UC Treatments / Results  Labs (all labs ordered are listed, but only abnormal results are displayed) Labs Reviewed  POC URINE PREG, ED - Abnormal; Notable for the following components:      Result Value   Preg Test, Ur POSITIVE (*)    All other components within normal limits  POCT URINALYSIS DIPSTICK, ED / UC  CERVICOVAGINAL ANCILLARY ONLY    EKG   Radiology No results found.  Procedures Procedures (including critical care time)  Medications Ordered in UC Medications - No data to display  Initial Impression / Assessment and Plan / UC Course  I have reviewed the triage vital signs and the nursing notes.  Pertinent labs & imaging results that were available during my care of the patient were reviewed by me and considered in my medical decision making (see chart for details).     Assessment negative for red flags or concerns.   Urinalysis with no signs of infection.  Urine pregnancy test was positive.  Patient declines any STI testing at this time.  Self swab for BV and yeast obtained.  Consider likely yeast infection based on symptoms.  Prenatal vitamin daily prescribed.  Lotrimin cream as needed for vaginal itching and irritation.  Follow-up with OB/GYN.  Final Clinical Impressions(s) / UC Diagnoses   Final diagnoses:  Positive pregnancy test  Acute vaginitis     Discharge Instructions     Your pregnancy test was positive.  Take the prenatal vitamin once a day and  follow up with OB/GYN.    Use the lotrimin cream as needed for vaginal itching and irritation.   Return or go to the Emergency Department if symptoms worsen or do not improve in the next few days.      ED Prescriptions    Medication Sig Dispense Auth. Provider   Prenatal Vit-Fe Fumarate-FA (PRENATAL COMPLETE) 14-0.4 MG TABS Take 1 tablet by mouth daily. 60 tablet Ivette Loyal, NP   clotrimazole (LOTRIMIN) 1 % cream Apply to affected area daily as needed 15 g Ivette Loyal, NP     PDMP not reviewed this encounter.   Ivette Loyal, NP 03/31/21 1029

## 2021-03-31 NOTE — ED Triage Notes (Signed)
Pt presents with recurrent vaginal itching with some slight pain and discharge for past few days.

## 2021-04-01 LAB — CERVICOVAGINAL ANCILLARY ONLY
Bacterial Vaginitis (gardnerella): NEGATIVE
Candida Glabrata: NEGATIVE
Candida Vaginitis: POSITIVE — AB
Comment: NEGATIVE
Comment: NEGATIVE
Comment: NEGATIVE

## 2021-04-04 ENCOUNTER — Telehealth (HOSPITAL_COMMUNITY): Payer: Self-pay

## 2021-04-15 ENCOUNTER — Other Ambulatory Visit: Payer: Self-pay

## 2021-04-15 ENCOUNTER — Encounter (HOSPITAL_COMMUNITY): Payer: Self-pay | Admitting: Obstetrics and Gynecology

## 2021-04-15 ENCOUNTER — Ambulatory Visit (HOSPITAL_COMMUNITY)
Admission: EM | Admit: 2021-04-15 | Discharge: 2021-04-15 | Disposition: A | Discharge status: Home / Self Care | Payer: Medicaid Other

## 2021-04-15 ENCOUNTER — Inpatient Hospital Stay (HOSPITAL_COMMUNITY): Payer: Medicaid Other

## 2021-04-15 ENCOUNTER — Inpatient Hospital Stay (HOSPITAL_COMMUNITY)
Admission: AD | Admit: 2021-04-15 | Discharge: 2021-04-15 | Disposition: A | Discharge status: Home / Self Care | Payer: Medicaid Other | Attending: Obstetrics and Gynecology | Admitting: Obstetrics and Gynecology

## 2021-04-15 DIAGNOSIS — Z79899 Other long term (current) drug therapy: Secondary | ICD-10-CM | POA: Insufficient documentation

## 2021-04-15 DIAGNOSIS — Z3A01 Less than 8 weeks gestation of pregnancy: Secondary | ICD-10-CM | POA: Insufficient documentation

## 2021-04-15 DIAGNOSIS — O3680X0 Pregnancy with inconclusive fetal viability, not applicable or unspecified: Secondary | ICD-10-CM | POA: Diagnosis not present

## 2021-04-15 DIAGNOSIS — O209 Hemorrhage in early pregnancy, unspecified: Secondary | ICD-10-CM | POA: Insufficient documentation

## 2021-04-15 DIAGNOSIS — Z5321 Procedure and treatment not carried out due to patient leaving prior to being seen by health care provider: Secondary | ICD-10-CM

## 2021-04-15 DIAGNOSIS — N939 Abnormal uterine and vaginal bleeding, unspecified: Secondary | ICD-10-CM | POA: Diagnosis not present

## 2021-04-15 DIAGNOSIS — O4690 Antepartum hemorrhage, unspecified, unspecified trimester: Secondary | ICD-10-CM

## 2021-04-15 LAB — URINALYSIS, ROUTINE W REFLEX MICROSCOPIC
Bilirubin Urine: NEGATIVE
Glucose, UA: NEGATIVE mg/dL
Hgb urine dipstick: NEGATIVE
Ketones, ur: NEGATIVE mg/dL
Leukocytes,Ua: NEGATIVE
Nitrite: NEGATIVE
Protein, ur: NEGATIVE mg/dL
Specific Gravity, Urine: 1.02 (ref 1.005–1.030)
pH: 6 (ref 5.0–8.0)

## 2021-04-15 LAB — COMPREHENSIVE METABOLIC PANEL
ALT: 20 U/L (ref 0–44)
AST: 21 U/L (ref 15–41)
Albumin: 4.1 g/dL (ref 3.5–5.0)
Alkaline Phosphatase: 63 U/L (ref 38–126)
Anion gap: 5 (ref 5–15)
BUN: 9 mg/dL (ref 6–20)
CO2: 26 mmol/L (ref 22–32)
Calcium: 9.3 mg/dL (ref 8.9–10.3)
Chloride: 106 mmol/L (ref 98–111)
Creatinine, Ser: 0.48 mg/dL (ref 0.44–1.00)
GFR, Estimated: >60 mL/min (ref >60)
Glucose, Bld: 87 mg/dL (ref 70–99)
Potassium: 3.6 mmol/L (ref 3.5–5.1)
Sodium: 137 mmol/L (ref 135–145)
Total Bilirubin: 0.5 mg/dL (ref 0.3–1.2)
Total Protein: 7.3 g/dL (ref 6.5–8.1)

## 2021-04-15 LAB — WET PREP, GENITAL
Clue Cells Wet Prep HPF POC: NONE SEEN
Sperm: NONE SEEN
Trich, Wet Prep: NONE SEEN
Yeast Wet Prep HPF POC: NONE SEEN

## 2021-04-15 LAB — CBC
HCT: 39.1 % (ref 36.0–46.0)
Hemoglobin: 12.8 g/dL (ref 12.0–15.0)
MCH: 28.8 pg (ref 26.0–34.0)
MCHC: 32.7 g/dL (ref 30.0–36.0)
MCV: 87.9 fL (ref 80.0–100.0)
Platelets: 269 10*3/uL (ref 150–400)
RBC: 4.45 MIL/uL (ref 3.87–5.11)
RDW: 13.2 % (ref 11.5–15.5)
WBC: 4.9 10*3/uL (ref 4.0–10.5)
nRBC: 0 % (ref 0.0–0.2)

## 2021-04-15 LAB — HCG, QUANTITATIVE, PREGNANCY: hCG, Beta Chain, Quant, S: 12100 m[IU]/mL — ABNORMAL HIGH (ref <5)

## 2021-04-15 NOTE — MAU Note (Signed)
Presents with c/o VB that began yesterday, but stopped.  Now began having VB with wiping again today.  Denies blood clots.  LMP approximately 02/27/2021.

## 2021-04-15 NOTE — MAU Provider Note (Signed)
History     CSN: [id number]  Arrival date and time: 04/15/21 1011   Event Date/Time   First Provider Initiated Contact with Patient 04/15/21 1129      Chief Complaint  Patient presents with  . Vaginal Bleeding   [name] is a 30 y.o. [phone] at [redacted]w[redacted]d who presents to MAU for vaginal bleeding which began yesterday and has been intermittent since that time. Patient reports only light brown bleeding with wiping, but reports she had a miscarriage 3 years ago and is concerned that this is happening again.  Pt denies vaginal discharge/odor/itching. Pt denies N/V, abdominal pain, constipation, diarrhea, or urinary problems. Pt denies fever, chills, fatigue, sweating or changes in appetite. Pt denies SOB or chest pain. Pt denies dizziness, HA, light-headedness, weakness.   OB History    Gravida  5   Para  4   Term  0   Preterm  4   AB  0   Living  4     SAB  0   IAB  0   Ectopic  0   Multiple  0   Live Births  4           Past Medical History:  Diagnosis Date  . Medical history non-contributory     Past Surgical History:  Procedure Laterality Date  . CESAREAN SECTION N/A 06/13/2018   Procedure: CESAREAN SECTION;  Surgeon: Riley Bing, MD;  Location: [facility] BIRTHING SUIT[facility];  Service: Obstetrics;  Laterality: N/A;  . MOUTH SURGERY    . WISDOM TOOTH EXTRACTION Bilateral     Family History  Problem Relation Age of Onset  . Healthy Father   . Alcohol abuse Neg Hx   . Arthritis Neg Hx   . Asthma Neg Hx   . Birth defects Neg Hx   . Cancer Neg Hx   . COPD Neg Hx   . Depression Neg Hx   . Diabetes Neg Hx   . Drug abuse Neg Hx   . Early death Neg Hx   . Hearing loss Neg Hx   . Heart disease Neg Hx   . Hyperlipidemia Neg Hx   . Hypertension Neg Hx   . Kidney disease Neg Hx   . Learning disabilities Neg Hx   . Mental illness Neg Hx   . Mental retardation Neg Hx   . Miscarriages / Stillbirths Neg Hx   . Stroke Neg Hx   . Vision loss Neg Hx    . Varicose Veins Neg Hx     Social History   Tobacco Use  . Smoking status: Never Smoker  . Smokeless tobacco: Never Used  Vaping Use  . Vaping Use: Never used  Substance Use Topics  . Alcohol use: No  . Drug use: No    Allergies: No Known Allergies  Medications Prior to Admission  Medication Sig Dispense Refill Last Dose  . Prenatal Vit-Fe Fumarate-FA (PRENATAL COMPLETE) 14-0.4 MG TABS Take 1 tablet by mouth daily. 60 tablet 0 04/14/2021 at 2200  . benzonatate (TESSALON) 100 MG capsule Take 1-2 capsules (100-200 mg total) by mouth 3 (three) times daily as needed. (Patient not taking: No sig reported) 60 capsule 0   . cetirizine (ZYRTEC) 10 MG tablet Take 1 tablet (10 mg total) by mouth daily. 30 tablet 0   . clotrimazole (LOTRIMIN) 1 % cream Apply to affected area daily as needed 15 g 0   . fluticasone (FLONASE) 50 MCG/ACT nasal spray Place 1 spray into both  nostrils daily. 16 g 0   . metroNIDAZOLE (FLAGYL) 500 MG tablet Take 1 tablet (500 mg total) by mouth 2 (two) times daily. 14 tablet 0   . Prenat-Fe Poly-Methfol-FA-DHA (VITAFOL ULTRA) 29-0.6-0.4-200 MG CAPS Take 1 capsule by mouth at bedtime. (Patient not taking: No sig reported) 30 capsule 11   . promethazine-dextromethorphan (PROMETHAZINE-DM) 6.25-15 MG/5ML syrup Take 5 mLs by mouth at bedtime as needed for cough. (Patient not taking: No sig reported) 100 mL 0   . pseudoephedrine (SUDAFED) 60 MG tablet Take 1 tablet (60 mg total) by mouth every 8 (eight) hours as needed for congestion. (Patient not taking: No sig reported) 30 tablet 0     Review of Systems  Constitutional: Negative for chills, diaphoresis, fatigue and fever.  Eyes: Negative for visual disturbance.  Respiratory: Negative for shortness of breath.   Cardiovascular: Negative for chest pain.  Gastrointestinal: Negative for abdominal pain, constipation, diarrhea, nausea and vomiting.  Genitourinary: Positive for vaginal bleeding. Negative for dysuria, flank  pain, frequency, pelvic pain, urgency and vaginal discharge.  Neurological: Negative for dizziness, weakness, light-headedness and headaches.   Physical Exam   Blood pressure 104/66, pulse 75, temperature 98.2 F (36.8 C), temperature source Oral, resp. rate 20, weight 61.9 kg, last menstrual period 02/27/2021, SpO2 98 %.  Patient Vitals for the past 24 hrs:  BP Temp Temp src Pulse Resp SpO2 Weight  04/15/21 1046 104/66 98.2 F (36.8 C) Oral 75 20 98 % --  04/15/21 1030 -- -- -- -- -- -- 61.9 kg   Physical Exam Vitals and nursing note reviewed.  Constitutional:      General: She is not in acute distress.    Appearance: Normal appearance. She is not ill-appearing, toxic-appearing or diaphoretic.  HENT:     Head: Normocephalic and atraumatic.  Pulmonary:     Effort: Pulmonary effort is normal.  Neurological:     Mental Status: She is alert and oriented to person, place, and time.  Psychiatric:        Mood and Affect: Mood normal.        Behavior: Behavior normal.        Thought Content: Thought content normal.        Judgment: Judgment normal.    Results for orders placed or performed during the hospital encounter of 04/15/21 (from the past 24 hour(s))  CBC     Status: None   Collection Time: 04/15/21 11:02 AM  Result Value Ref Range   WBC 4.9 4.0 - 10.5 K/uL   RBC 4.45 3.87 - 5.11 MIL/uL   Hemoglobin 12.8 12.0 - 15.0 g/dL   HCT 03.5 24.8 - 18.5 %   MCV 87.9 80.0 - 100.0 fL   MCH 28.8 26.0 - 34.0 pg   MCHC 32.7 30.0 - 36.0 g/dL   RDW 90.9 31.1 - 21.6 %   Platelets 269 150 - 400 K/uL   nRBC 0.0 0.0 - 0.2 %  Comprehensive metabolic panel     Status: None   Collection Time: 04/15/21 11:02 AM  Result Value Ref Range   Sodium 137 135 - 145 mmol/L   Potassium 3.6 3.5 - 5.1 mmol/L   Chloride 106 98 - 111 mmol/L   CO2 26 22 - 32 mmol/L   Glucose, Bld 87 70 - 99 mg/dL   BUN 9 6 - 20 mg/dL   Creatinine, Ser 2.44 0.44 - 1.00 mg/dL   Calcium 9.3 8.9 - 69.5 mg/dL   Total  Protein 7.3 6.5 -  8.1 g/dL   Albumin 4.1 3.5 - 5.0 g/dL   AST 21 15 - 41 U/L   ALT 20 0 - 44 U/L   Alkaline Phosphatase 63 38 - 126 U/L   Total Bilirubin 0.5 0.3 - 1.2 mg/dL   GFR, Estimated >81 >19 mL/min   Anion gap 5 5 - 15  hCG, quantitative, pregnancy     Status: Abnormal   Collection Time: 04/15/21 11:02 AM  Result Value Ref Range   hCG, Beta Chain, Quant, S 12,100 (H) <5 mIU/mL  Urinalysis, Routine w reflex microscopic Urine, Clean Catch     Status: Abnormal   Collection Time: 04/15/21 12:04 PM  Result Value Ref Range   Color, Urine YELLOW YELLOW   APPearance HAZY (A) CLEAR   Specific Gravity, Urine 1.020 1.005 - 1.030   pH 6.0 5.0 - 8.0   Glucose, UA NEGATIVE NEGATIVE mg/dL   Hgb urine dipstick NEGATIVE NEGATIVE   Bilirubin Urine NEGATIVE NEGATIVE   Ketones, ur NEGATIVE NEGATIVE mg/dL   Protein, ur NEGATIVE NEGATIVE mg/dL   Nitrite NEGATIVE NEGATIVE   Leukocytes,Ua NEGATIVE NEGATIVE  Wet prep, genital     Status: Abnormal   Collection Time: 04/15/21 12:36 PM   Specimen: PATH Cytology Cervicovaginal Ancillary Only  Result Value Ref Range   Yeast Wet Prep HPF POC NONE SEEN NONE SEEN   Trich, Wet Prep NONE SEEN NONE SEEN   Clue Cells Wet Prep HPF POC NONE SEEN NONE SEEN   WBC, Wet Prep HPF POC MANY (A) NONE SEEN   Sperm NONE SEEN    US OB LESS THAN 14 WEEKS WITH OB TRANSVAGINAL  Result Date: 04/15/2021 CLINICAL DATA:  Vaginal bleeding EXAM: OBSTETRIC <14 WK [place] AND TRANSVAGINAL OB US TECHNIQUE: Both transabdominal and transvaginal ultrasound examinations were performed for complete evaluation of the gestation as well as the maternal uterus, adnexal regions, and pelvic cul-de-sac. Transvaginal technique was performed to assess early pregnancy. COMPARISON:  None. FINDINGS: Intrauterine gestational sac: Visualized-single Yolk sac:  Not convincingly visualized Embryo:  Visualized Cardiac Activity: Visualized Heart Rate: 66 bpm CRL:  5 mm   6 w   1 d                  [place] EDC:  December 08, 2021 Subchorionic hemorrhage:  None visualized. Maternal uterus/adnexae: Cervical os closed. Right ovary measures 3.6 x 2.0 x 2.9 cm. Left ovary measures 2.5 x 1.9 x 3.3 cm. No extrauterine pelvic mass. No free pelvic fluid. IMPRESSION: Single live intrauterine gestation with estimated gestational age of approximately 6 weeks. Note that yolk sac is not convincingly visualized. Fetal heart rate measured at 66 beats per minute. This comparatively slow heart rate may warrant close clinical and imaging surveillance. No evident subchorionic hemorrhage. No appreciable extrauterine mass or fluid. Electronically Signed   By: Bretta Bang III M.D.   On: 04/15/2021 13:43    MAU Course  Procedures  MDM -r/o ectopic -UA: hazy, otherwise WNL -CBC: WNL -CMP: WNL -[place]: single IUP, +FHR 66bpm -hCG: 12,100 -ABO: A Positive -WetPrep: WNL -GC/CT collected -pt discharged to home in stable condition  Orders Placed This Encounter  Procedures  . Wet prep, genital    Standing Status:   Standing    Number of Occurrences:   1  . US OB LESS THAN 14 WEEKS WITH OB TRANSVAGINAL    Standing Status:   Standing    Number of Occurrences:   1    Order Specific Question:  Symptom/Reason for Exam    Answer:   Vaginal bleeding in pregnancy [[id number]]  . US OB LESS THAN 14 WEEKS WITH OB TRANSVAGINAL    Standing Status:   Future    Standing Expiration Date:   04/15/2022    Scheduling Instructions:     Please schedule patient for follow-up US, Monday - Thursday between 8:00 am - 10:00 am and 12:00 pm - 3:00 pm. The patient will follow-up with [facility] or receive a phone call from a provider after US for results. Do NOT schedule afternoon appointments on 03/27/21, 05/22/21, 07/24/21, 09/25/21 or 11/20/21.    Order Specific Question:   Reason for Exam (SYMPTOM  OR DIAGNOSIS REQUIRED)    Answer:   viability    Order Specific Question:   Preferred Imaging Location?    Answer:   [facility]  . CBC     Standing Status:   Standing    Number of Occurrences:   1  . Comprehensive metabolic panel    Standing Status:   Standing    Number of Occurrences:   1  . hCG, quantitative, pregnancy    Standing Status:   Standing    Number of Occurrences:   1  . Urinalysis, Routine w reflex microscopic    Standing Status:   Standing    Number of Occurrences:   1  . Discharge patient    Order Specific Question:   Discharge disposition    Answer:   01-Home or Self Care [1]    Order Specific Question:   Discharge patient date    Answer:   04/15/2021   No orders of the defined types were placed in this encounter.  Assessment and Plan   1. Pregnancy with uncertain fetal viability, single or unspecified fetus   2. Vaginal bleeding in pregnancy   3. [redacted] weeks gestation of pregnancy    Allergies as of 04/15/2021   No Known Allergies     Medication List    TAKE these medications   benzonatate 100 MG capsule Commonly known as: TESSALON Take 1-2 capsules (100-200 mg total) by mouth 3 (three) times daily as needed.   cetirizine 10 MG tablet Commonly known as: ZYRTEC Take 1 tablet (10 mg total) by mouth daily.   clotrimazole 1 % cream Commonly known as: LOTRIMIN Apply to affected area daily as needed   fluticasone 50 MCG/ACT nasal spray Commonly known as: FLONASE Place 1 spray into both nostrils daily.   metroNIDAZOLE 500 MG tablet Commonly known as: FLAGYL Take 1 tablet (500 mg total) by mouth 2 (two) times daily.   Prenatal Complete 14-0.4 MG Tabs Take 1 tablet by mouth daily.   promethazine-dextromethorphan 6.25-15 MG/5ML syrup Commonly known as: PROMETHAZINE-DM Take 5 mLs by mouth at bedtime as needed for cough.   pseudoephedrine 60 MG tablet Commonly known as: SUDAFED Take 1 tablet (60 mg total) by mouth every 8 (eight) hours as needed for congestion.   Vitafol Ultra 29-0.6-0.4-200 MG Caps Take 1 capsule by mouth at bedtime.      -safe meds in pregnancy list given -OB  providers list given -US scheduled for 10-14 days for viability -return MAU precautions given -pt discharged to home in stable condition  Joni Reiningicole E Yanely Mast 04/15/2021, 2:04 PM

## 2021-04-15 NOTE — Discharge Instructions (Signed)
Safe Medications in Pregnancy    Acne: Benzoyl Peroxide Salicylic Acid  Backache/Headache: Tylenol: 2 regular strength every 4 hours OR              2 Extra strength every 6 hours  Colds/Coughs/Allergies: Benadryl (alcohol free) 25 mg every 6 hours as needed Breath right strips Claritin Cepacol throat lozenges Chloraseptic throat spray Cold-Eeze- up to three times per day Cough drops, alcohol free Flonase (by prescription only) Guaifenesin Mucinex Robitussin DM (plain only, alcohol free) Saline nasal spray/drops Sudafed (pseudoephedrine) & Actifed ** use only after [redacted] weeks gestation and if you do not have high blood pressure Tylenol Vicks Vaporub Zinc lozenges Zyrtec   Constipation: Colace Ducolax suppositories Fleet enema Glycerin suppositories Metamucil Milk of magnesia Miralax Senokot Smooth move tea  Diarrhea: Kaopectate Imodium A-D  *NO pepto Bismol  Hemorrhoids: Anusol Anusol HC Preparation H Tucks  Indigestion: Tums Maalox Mylanta Zantac  Pepcid  Insomnia: Benadryl (alcohol free) 25mg  every 6 hours as needed Tylenol PM Unisom, no Gelcaps  Leg Cramps: Tums MagGel  Nausea/Vomiting:  Bonine Dramamine Emetrol Ginger extract Sea bands Meclizine  Nausea medication to take during pregnancy:  Unisom (doxylamine succinate 25 mg tablets) Take one tablet daily at bedtime. If symptoms are not adequately controlled, the dose can be increased to a maximum recommended dose of two tablets daily (1/2 tablet in the morning, 1/2 tablet mid-afternoon and one at bedtime). Vitamin B6 100mg  tablets. Take one tablet twice a day (up to 200 mg per day).  Skin Rashes: Aveeno products Benadryl cream or 25mg  every 6 hours as needed Calamine Lotion 1% cortisone cream  Yeast infection: Gyne-lotrimin 7 Monistat 7   **If taking multiple medications, please check labels to avoid duplicating the same active ingredients **take  medication as directed on the label ** Do not exceed 4000 mg of tylenol in 24 hours **Do not take medications that contain aspirin or ibuprofen          Prenatal Care Providers           [facility] @ [facility] - accepts patients without insurance  Phone: [phone]  Center for @ [facility]   Phone: [phone]  [facility] @[place]       Phone: [phone]            [facility] @ [place]     Phone: [phone]          [facility] @ [facility]   Phone: [phone]  [facility] @ [facility] - accepts patients without insurance  Phone: [phone]  [facility] for [facility] @ [facility] Phone: [phone]     [facility] - accepts patients without insurance Phone: [phone]  [facility]  Phone: [phone]  [facility] Phone: [phone]  [facility] Phone: [phone]  [facility] Phone: [phone]  [facility] Phone: [phone]  [facility]  Phone: [phone]         Vaginal Bleeding During Pregnancy, First Trimester A small amount of bleeding from the vagina, or spotting, is common during early pregnancy. Some bleeding may be related to the pregnancy, and some may not. In many cases, the bleeding is normal and is not a problem. However, bleeding can also be a sign of something serious. Normal things that may cause bleeding during the first trimester:  Implantation of the fertilized egg in the lining of the uterus.  Rapid changes in blood vessels. This is caused by changes that are happening to the body during pregnancy.  Sex.  Pelvic exams. Abnormal things that may cause bleeding during the first trimester include:  Infection or inflammation of the cervix.  Growths or polyps on the cervix.  Miscarriage or  threatened miscarriage.  Pregnancy that is growing outside of the uterus (ectopic pregnancy).  A fertilized egg that becomes a mass of tissue (molar pregnancy). Tell your health care provider right away if there is any bleeding from your vagina. Follow these instructions at home: Monitoring your bleeding Monitor your bleeding.  Pay attention to any changes in your symptoms. Let your health care provider know about any concerns.  Try to understand when the bleeding occurs. Does the bleeding start on its own, or does it start after something is done, such as sex or a pelvic exam?  Use a diary to record the things you see about your bleeding, including: ? The kind of bleeding you are having. Does the bleeding start and stop irregularly, or is it a constant flow? ? The severity of your bleeding. Is the bleeding heavy or light? ? The number of pads you use each day, how often you change them, and how soaked they are.  Tell your health care provider if you pass tissue. He or she may want to see it.   Activity  Follow instructions from your health care provider about limiting your activity. Ask what activities are safe for you.  Do not have sex until your health care provider says that this is safe.  If needed, make plans for someone to help with your regular activities. General instructions  Take over-the-counter and prescription medicines only as told by your health care provider.  Do not take aspirin because it can cause bleeding.  Do not use tampons or douche.  Keep all follow-up visits. This is important. Contact a health care provider if:  You have vaginal bleeding during any part of your pregnancy.  You have cramps or labor pains.  You have a fever or chills. Get help right away if:  You have severe cramps in your back or abdomen.  You pass large clots or a large amount of tissue from your vagina.  Your bleeding increases.  You feel light-headed or weak, or you  faint.  You are leaking fluid or have a gush of fluid from your vagina. Summary  A small amount of bleeding from the vagina is common during early pregnancy.  Be sure to tell your health care provider about any vaginal bleeding right away.  Try to understand when bleeding occurs. Does bleeding occur on its own, or does it occur after something is done, such as sex or pelvic exams?  Keep all follow-up visits. This is important. This information is not intended to replace advice given to you by your health care provider. Make sure you discuss any questions you have with your health care provider. Document Revised: 08/15/2020 Document Reviewed: 08/15/2020 Elsevier Patient Education  2021 [employer].

## 2021-04-16 LAB — GC/CHLAMYDIA PROBE AMP (~~LOC~~) NOT AT ARMC
Chlamydia: NEGATIVE
Comment: NEGATIVE
Comment: NORMAL
Neisseria Gonorrhea: NEGATIVE

## 2021-04-24 ENCOUNTER — Ambulatory Visit (HOSPITAL_COMMUNITY)
Admission: EM | Admit: 2021-04-24 | Discharge: 2021-04-24 | Disposition: A | Discharge status: Home / Self Care | Payer: Medicaid Other

## 2021-04-24 ENCOUNTER — Other Ambulatory Visit: Payer: Self-pay

## 2021-04-25 ENCOUNTER — Encounter (HOSPITAL_COMMUNITY): Payer: Self-pay | Admitting: Emergency Medicine

## 2021-04-25 ENCOUNTER — Ambulatory Visit (HOSPITAL_COMMUNITY)
Admission: EM | Admit: 2021-04-25 | Discharge: 2021-04-25 | Disposition: A | Discharge status: Home / Self Care | Payer: Medicaid Other | Attending: Physician Assistant | Admitting: Physician Assistant

## 2021-04-25 DIAGNOSIS — O99891 Other specified diseases and conditions complicating pregnancy: Secondary | ICD-10-CM

## 2021-04-25 DIAGNOSIS — H66003 Acute suppurative otitis media without spontaneous rupture of ear drum, bilateral: Secondary | ICD-10-CM | POA: Diagnosis not present

## 2021-04-25 DIAGNOSIS — Z3A08 8 weeks gestation of pregnancy: Secondary | ICD-10-CM

## 2021-04-25 DIAGNOSIS — Z20822 Contact with and (suspected) exposure to covid-19: Secondary | ICD-10-CM | POA: Diagnosis not present

## 2021-04-25 LAB — SARS CORONAVIRUS 2 (TAT 6-24 HRS): SARS Coronavirus 2: NEGATIVE

## 2021-04-25 MED ORDER — AMOXICILLIN 500 MG PO CAPS: 500.0000 mg | ORAL_CAPSULE | Freq: Two times a day (BID) | ORAL | 0 refills | Status: DC

## 2021-04-25 NOTE — Discharge Instructions (Signed)
Take amoxicillin to cover for ear infection twice daily for 7 days.  Use Tylenol for pain relief.  If symptoms do not improve please return for reevaluation.

## 2021-04-25 NOTE — ED Provider Notes (Addendum)
[facility]    CSN: [id number] Arrival date & time: 04/25/21  0815      History   Chief Complaint Chief Complaint  Patient presents with  . Sore Throat  . Otalgia    HPI Maria Riley is a 30 y.o. female.   Patient presents today with a 4-day history of bilateral otalgia.  Patient Spanish-speaking interpreter was utilized during visit.  Reports associated sore throat and nasal congestion.  Reports several of her children have been sick.  Pain is rated 3 on a 0-10 pain scale, localized to bilateral ears without radiation, described as aching, no aggravating or alleviating factors identified.  She has tried Tylenol without improvement of symptoms.  Denies any fever, change in hearing, chest pain, shortness of breath, nausea, vomiting.  She denies any recent antibiotic use.  She denies history of allergies, asthma, COPD.  She does not smoke.  She is currently [redacted] weeks pregnant.     Past Medical History:  Diagnosis Date  . Medical history non-contributory     Patient Active Problem List   Diagnosis Date Noted  . Overdose 12/19/2020  . MDD (major depressive disorder), recurrent episode, severe (HCC) 07/08/2018  . DIC (disseminated intravascular coagulation) (HCC) 06/13/2018  . Antepartum placental abruption 06/13/2018  . PPH (postpartum hemorrhage) 06/13/2018  . Language barrier 06/13/2018  . Supervision of high risk pregnancy, antepartum 11/23/2016  . History of preterm delivery 11/23/2016    Past Surgical History:  Procedure Laterality Date  . CESAREAN SECTION N/A 06/13/2018   Procedure: CESAREAN SECTION;  Surgeon: Leupp Bing, MD;  Location: [facility] BIRTHING SUITES;  Service: Obstetrics;  Laterality: N/A;  . MOUTH SURGERY    . WISDOM TOOTH EXTRACTION Bilateral     OB History    Gravida  5   Para  4   Term  0   Preterm  4   AB  0   Living  4     SAB  0   IAB  0   Ectopic  0   Multiple  0   Live Births  4            Home  Medications    Prior to Admission medications   Medication Sig Start Date End Date Taking? Authorizing Provider  amoxicillin (AMOXIL) 500 MG capsule Take 1 capsule (500 mg total) by mouth 2 (two) times daily. 04/25/21  Yes Isadore Palecek K, PA-C  cetirizine (ZYRTEC) 10 MG tablet Take 1 tablet (10 mg total) by mouth daily. 01/02/21 02/01/21  Georgetta Haber, NP  Prenatal Vit-Fe Fumarate-FA (PRENATAL COMPLETE) 14-0.4 MG TABS Take 1 tablet by mouth daily. 03/31/21   Ivette Loyal, NP  fluticasone (FLONASE) 50 MCG/ACT nasal spray Place 1 spray into both nostrils daily. 01/02/21 04/25/21  Burky, Barron Alvine, NP  LO LOESTRIN FE 1 MG-10 MCG / 10 MCG tablet Take 1 tablet by mouth daily. 03/06/20 07/06/20  Brock Bad, MD  medroxyPROGESTERone (DEPO-PROVERA) 150 MG/ML injection Inject 1 mL (150 mg total) into the muscle every 3 (three) months. Patient not taking: Reported on 11/14/2018 07/18/18 07/06/20  Brock Bad, MD    Family History Family History  Problem Relation Age of Onset  . Healthy Father   . Alcohol abuse Neg Hx   . Arthritis Neg Hx   . Asthma Neg Hx   . Birth defects Neg Hx   . Cancer Neg Hx   . COPD Neg Hx   . Depression Neg Hx   .  Diabetes Neg Hx   . Drug abuse Neg Hx   . Early death Neg Hx   . Hearing loss Neg Hx   . Heart disease Neg Hx   . Hyperlipidemia Neg Hx   . Hypertension Neg Hx   . Kidney disease Neg Hx   . Learning disabilities Neg Hx   . Mental illness Neg Hx   . Mental retardation Neg Hx   . Miscarriages / Stillbirths Neg Hx   . Stroke Neg Hx   . Vision loss Neg Hx   . Varicose Veins Neg Hx     Social History Social History   Tobacco Use  . Smoking status: Never Smoker  . Smokeless tobacco: Never Used  Vaping Use  . Vaping Use: Never used  Substance Use Topics  . Alcohol use: No  . Drug use: No     Allergies   Patient has no known allergies.   Review of Systems Review of Systems  Constitutional: Negative for activity change, appetite  change, fatigue and fever.  HENT: Positive for congestion, ear pain and sore throat. Negative for sinus pressure and sneezing.   Respiratory: Negative for cough and shortness of breath.   Cardiovascular: Negative for chest pain.  Gastrointestinal: Negative for abdominal pain, diarrhea, nausea and vomiting.  Musculoskeletal: Negative for arthralgias and myalgias.  Neurological: Positive for headaches. Negative for dizziness and light-headedness.     Physical Exam Triage Vital Signs ED Triage Vitals  Enc Vitals Group     BP 04/25/21 0902 128/80     Pulse Rate 04/25/21 0902 71     Resp 04/25/21 0902 17     Temp 04/25/21 0902 99 F (37.2 C)     Temp src --      SpO2 04/25/21 0902 100 %     Weight --      Height --      Head Circumference --      Peak Flow --      Pain Score 04/25/21 0900 3     Pain Loc --      Pain Edu? --      Excl. in GC? --    No data found.  Updated Vital Signs BP 128/80   Pulse 71   Temp 99 F (37.2 C)   Resp 17   LMP 02/27/2021 (Approximate)   SpO2 100%   Visual Acuity Right Eye Distance:   Left Eye Distance:   Bilateral Distance:    Right Eye Near:   Left Eye Near:    Bilateral Near:     Physical Exam Vitals reviewed.  Constitutional:      General: She is awake. She is not in acute distress.    Appearance: Normal appearance. She is not ill-appearing.     Comments: Very pleasant female appears stated age in no acute distress  HENT:     Head: Normocephalic and atraumatic.     Right Ear: Ear canal and external ear normal. Tympanic membrane is erythematous and bulging.     Left Ear: Ear canal and external ear normal. Tympanic membrane is erythematous and bulging.     Nose:     Right Sinus: No maxillary sinus tenderness or frontal sinus tenderness.     Left Sinus: No maxillary sinus tenderness or frontal sinus tenderness.     Mouth/Throat:     Pharynx: Uvula midline. Posterior oropharyngeal erythema present. No oropharyngeal exudate.      Comments: Mild erythema posterior pharynx Cardiovascular:  Rate and Rhythm: Normal rate and regular rhythm.     Heart sounds: No murmur heard.   Pulmonary:     Effort: Pulmonary effort is normal.     Breath sounds: Normal breath sounds. No wheezing, rhonchi or rales.     Comments: Clear to auscultation bilaterally Abdominal:     General: Bowel sounds are normal.     Palpations: Abdomen is soft.     Tenderness: There is no abdominal tenderness.  Lymphadenopathy:     Head:     Right side of head: No submental, submandibular or tonsillar adenopathy.     Left side of head: No submental, submandibular or tonsillar adenopathy.     Cervical: No cervical adenopathy.  Psychiatric:        Behavior: Behavior is cooperative.      UC Treatments / Results  Labs (all labs ordered are listed, but only abnormal results are displayed) Labs Reviewed - No data to display  EKG   Radiology No results found.  Procedures Procedures (including critical care time)  Medications Ordered in UC Medications - No data to display  Initial Impression / Assessment and Plan / UC Course  I have reviewed the triage vital signs and the nursing notes.  Pertinent labs & imaging results that were available during my care of the patient were reviewed by me and considered in my medical decision making (see chart for details).     Otitis media identified on physical exam.  Patient started on amoxicillin given she is pregnant.  She was encouraged to use Tylenol for pain relief.  Discussed that if she has any worsening symptoms she is to return for reevaluation.  Recommended she have the ears reevaluated within 1 week with PCP to ensure clearing of infection.  Strict return precautions given to which patient expressed understanding.  Final Clinical Impressions(s) / UC Diagnoses   Final diagnoses:  Non-recurrent acute suppurative otitis media of both ears without spontaneous rupture of tympanic membranes   [redacted] weeks gestation of pregnancy     Discharge Instructions     Take amoxicillin to cover for ear infection twice daily for 7 days.  Use Tylenol for pain relief.  If symptoms do not improve please return for reevaluation.    ED Prescriptions    Medication Sig Dispense Auth. Provider   amoxicillin (AMOXIL) 500 MG capsule Take 1 capsule (500 mg total) by mouth 2 (two) times daily. 14 capsule Alieu Finnigan K, PA-C     PDMP not reviewed this encounter.   Jeani Hawking, PA-C 04/25/21 1016    Elma Shands, Noberto Retort, PA-C 04/25/21 1020

## 2021-04-25 NOTE — ED Triage Notes (Signed)
Presents with sore throat and bilateral ear pain xs 3 days. States has taken tylenol for the pain.

## 2021-04-28 ENCOUNTER — Inpatient Hospital Stay (HOSPITAL_COMMUNITY)
Admission: AD | Admit: 2021-04-28 | Discharge: 2021-04-29 | Disposition: A | Discharge status: Home / Self Care | Payer: Medicaid Other | Attending: Obstetrics & Gynecology | Admitting: Obstetrics & Gynecology

## 2021-04-28 ENCOUNTER — Other Ambulatory Visit: Payer: Self-pay

## 2021-04-28 ENCOUNTER — Telehealth: Payer: Self-pay

## 2021-04-28 DIAGNOSIS — O039 Complete or unspecified spontaneous abortion without complication: Secondary | ICD-10-CM | POA: Insufficient documentation

## 2021-04-28 DIAGNOSIS — Z793 Long term (current) use of hormonal contraceptives: Secondary | ICD-10-CM | POA: Insufficient documentation

## 2021-04-28 DIAGNOSIS — O209 Hemorrhage in early pregnancy, unspecified: Secondary | ICD-10-CM

## 2021-04-28 DIAGNOSIS — Z3A08 8 weeks gestation of pregnancy: Secondary | ICD-10-CM | POA: Insufficient documentation

## 2021-04-28 DIAGNOSIS — R102 Pelvic and perineal pain: Secondary | ICD-10-CM | POA: Insufficient documentation

## 2021-04-28 NOTE — MAU Note (Signed)
PT SAYS VAG BLEEDING- RED- IN TOILET- AT 7PM. HAS BEEN HERE BEFORE FOR BLEEDING  PAD ON IN TRIAGE - SMALL RED SPOT.  CRAMPS STARTED AT 10PM.

## 2021-04-28 NOTE — Telephone Encounter (Signed)
TC from patient she report some bleeding that started over the weekend. She is not bleeding 1-2 pads per hour just some spotting after she wipes.  Patient reports 0/10 for pain. I have advised her to continue monitoring her bleeding and call us back or go to MAU if the bleeding continues. Patient has appointment on 05/01/2021.

## 2021-04-29 ENCOUNTER — Inpatient Hospital Stay (HOSPITAL_COMMUNITY): Payer: Medicaid Other

## 2021-04-29 DIAGNOSIS — O039 Complete or unspecified spontaneous abortion without complication: Secondary | ICD-10-CM

## 2021-04-29 DIAGNOSIS — R102 Pelvic and perineal pain: Secondary | ICD-10-CM | POA: Diagnosis not present

## 2021-04-29 DIAGNOSIS — Z793 Long term (current) use of hormonal contraceptives: Secondary | ICD-10-CM | POA: Diagnosis not present

## 2021-04-29 DIAGNOSIS — Z3A08 8 weeks gestation of pregnancy: Secondary | ICD-10-CM | POA: Diagnosis not present

## 2021-04-29 NOTE — Discharge Instructions (Signed)
Aborto espontneo Miscarriage El aborto espontneo es la prdida de un embarazo antes de la semana20. La mayor parte de los abortos espontneos ocurre durante los primeros de Psychiatrist. A veces, un aborto ocurre antes de que la mujer sepa que est [place]. El aborto espontneo puede ser Neomia Dear experiencia que afecte emocionalmente a Dealer. Si ha sufrido un aborto espontneo, hable con el mdico y hgale las preguntas que tenga sobre la prdida del beb, el proceso de duelo y los planes futuros de [place]. Cules son las causas? Muchas veces la causa del aborto espontneo no se conoce. Qu incrementa el riesgo? Los siguientes factores pueden hacer que una embarazada sea ms propensa a sufrir un aborto espontneo: Ciertas enfermedades crnicas  Enfermedades que afectan al equilibrio hormonal del cuerpo, como una enfermedad tiroidea o sndrome del ovario poliqustico.  Diabetes.  Trastornos autoinmunitarios.  Infecciones.  Trastornos hemorrgicos.  Obesidad. Factores de estilo de vida  Consumir productos con tabaco o nicotina o estar expuesta al humo del tabaco.  Beber alcohol.  Consumir grandes cantidades de cafena.  Consumo de drogas. Problemas relacionados con las estructuras o los rganos genitales  Insuficiencia cervical. Esto es cuando la parte ms baja del tero (cuello uterino) se abre y se hace ms delgada antes de que el embarazo llegue a trmino.  Padecer una afeccin llamada sndrome de [place]. Este sndrome causa la formacin de cicatrices en el tero o hace que el tero tenga una estructura anormal.  Crecimientos fibrosos, llamados fibromas, en el tero.  Anormalidades congnitas. Estos son problemas que ya estn presentes en el nacimiento.  Infeccin en el cuello del tero. Antecedentes personales o mdicos  Lesiones (traumatismos).  Haber tenido un aborto espontneo antes.  Ser menor de 18aos o mayor de 35aos de edad.  Exposicin a  sustancias nocivas del [address]. Esto puede incluir radiacin o metales pesados, como el plomo.  Uso de ciertos medicamentos. Cules son los signos o sntomas? Los sntomas de esta afeccin incluyen:  Sangrado o manchado vaginal, con o sin clicos o dolor.  Dolor o clicos en el abdomen o en la parte inferior de la espalda.  Salida de lquido o tejido por la vagina. Cmo se diagnostica? Esta afeccin se puede diagnosticar en funcin de lo siguiente:  Un examen fsico.  Una ecografa.  Pruebas de laboratorio, como anlisis de [place], [place] de [place] o hisopados para Engineer, manufacturing una infeccin. Cmo se trata? A veces no es necesario dar tratamiento a un aborto espontneo si todo el tejido fetal que estaba en el tero sale por s solo y no hay otros problemas como infeccin o sangrado abundante. En otros casos, esta afeccin puede tratarse con lo siguiente:  Dilatacin y curetaje (D y C). En este procedimiento, se expande el cuello uterino y se extrae todo resto de tejido fetal del recubrimiento del tero (endometrio).  Medicamentos. Pueden incluir: ? Antibiticos para tratar una infeccin. ? Medicamentos para expulsar los restos de tejido fetal del cuerpo. ? Medicamentos para reducir Theatre manager) el tamao del tero. Estos medicamentos se pueden administrar si hay un sangrado abundante. Si tiene sangre Rh negativa, se le puede administrar una inyeccin de un medicamento llamado inmunoglobulina Rho(D). Este medicamento ayuda a Education officer, community en futuros embarazos. Siga estas instrucciones en su casa: Medicamentos  Use los medicamentos de venta libre y los recetados solamente como se lo haya indicado el mdico.  Si le recetaron antibiticos, tmelos como se lo haya indicado el mdico. No deje de tomar el antibitico [place]  comience a sentirse mejor. Actividad  Haga reposo como se lo haya indicado el mdico. Pregntele al mdico qu actividades son seguras para  usted.  Pdale a alguien que la ayude con las responsabilidades familiares y del hogar durante este tiempo. Instrucciones generales  Est atenta a la cantidad de tejido o cogulos de [employer] de la vagina.  No tenga relaciones sexuales, no se haga duchas vaginales ni se introduzca nada, como tampones, en la vagina hasta que el mdico la autorice.  Para que usted y su pareja puedan sobrellevar el proceso del duelo, hable con su mdico o [place] apoyo psicolgico.  Cuando est lista, visite a su mdico para hablar sobre los pasos importantes que deber seguir en relacin con su salud. Tambin hable [employer] que deber tomar para tener un embarazo saludable en el futuro.  Cumpla con todas las visitas de seguimiento. Esto es importante.   Dnde buscar ms informacin  The [employer] of Obstetricians and Gynecologists (Colegio Estad[employer]idense de Obstetras y Scientific laboratory technician): acog.org  U.S. Department of Health and [employer], Office on [employer] Health (Departamento de Salud y Servicios Humanos de los [place], [place] de Salud de la Mujer): http://hoffman.com/ Comunquese con un mdico si:  Lance Muss o escalofros.  Le sale lquido con mal olor de la vagina.  El sangrado aumenta en vez de disminuir.  Le salen cogulos de sangre o tejido de la vagina. Solicite ayuda de inmediato si:  Siente calambres intensos o dolor en la espalda o en el abdomen.  Tiene un sangrado abundante que llena 2apsitos sanitarios grandes por hora durante ms de 2horas.  Se siente mareada o dbil.  Se desmaya.  Se siente triste, y esa tristeza se apodera de sus pensamientos.  Piensa en hacerse dao. Si alguna vez siente que puede lastimarse o Physicist, medical a Economist, o tiene pensamientos de poner fin a su vida, busque ayuda de inmediato. Dirjase al servicio de urgencias ms cercano o:  Comunquese con el servicio de emergencias de su localidad (911 en los  Estados Unidos).  Llame a una lnea de asistencia al suicida y atencin en crisis como National Suicide Prevention Lifeline (Lnea Nacional de Prevencin del Suicidio) al ([phone])216-8220. Est disponible las 24 horas del da en los EE.[place].  Enve un mensaje de texto a la lnea para casos de crisis al [phone] (en los EE.UU.). Resumen  La mayor parte de los abortos espontneos ocurre durante los primeros de Psychiatrist. En algunos casos, el aborto espontneo ocurre antes de que la mujer sepa que est [place].  Siga las instrucciones del mdico acerca de los medicamentos y la [place].  Para que usted y su pareja puedan sobrellevar el duelo, hable con su mdico o consiga apoyo psicolgico.  Cumpla con todas las visitas de seguimiento. Esta informacin no tiene Theme park manager el consejo del mdico. Asegrese de hacerle al mdico cualquier pregunta que tenga. Document Revised: 06/26/2020 Document Reviewed: 06/26/2020 Elsevier Patient Education  2021 [employer].

## 2021-04-29 NOTE — MAU Provider Note (Signed)
Chief Complaint: Vaginal Bleeding   None       SUBJECTIVE HPI: Maria Riley is a 30 y.o. [phone] at [redacted]w[redacted]d by LMP who presents to maternity admissions reporting vaginal bleeding tonight.  Has some pelvic cramping as well.  . She denies vaginal itching/burning, urinary symptoms, h/a, dizziness, n/v, or fever/chills.    Vaginal Bleeding The patient's primary symptoms include pelvic pain and vaginal bleeding. The patient's pertinent negatives include no genital itching, genital lesions or genital odor. This is a recurrent problem. The current episode started today. The problem occurs intermittently. The problem has been unchanged. The pain is moderate. She is pregnant. Associated symptoms include abdominal pain. Pertinent negatives include no constipation, diarrhea, dysuria, fever, nausea or vomiting. The vaginal discharge was bloody. The vaginal bleeding is lighter than menses. She has not been passing clots. She has not been passing tissue. Nothing aggravates the symptoms. She has tried nothing for the symptoms.   RN Note: PT SAYS VAG BLEEDING- RED- IN TOILET- AT 7PM. HAS BEEN HERE BEFORE FOR BLEEDING  PAD ON IN TRIAGE - SMALL RED SPOT.  CRAMPS STARTED AT 10PM.   Past Medical History:  Diagnosis Date  . Medical history non-contributory    Past Surgical History:  Procedure Laterality Date  . CESAREAN SECTION N/A 06/13/2018   Procedure: CESAREAN SECTION;  Surgeon: [place] Bing, MD;  Location: [facility] BIRTHING SUITES;  Service: Obstetrics;  Laterality: N/A;  . MOUTH SURGERY    . WISDOM TOOTH EXTRACTION Bilateral    Social History   Socioeconomic History  . Marital status: Single    Spouse name: Not on file  . Number of children: Not on file  . Years of education: Not on file  . Highest education level: Not on file  Occupational History  . Not on file  Tobacco Use  . Smoking status: Never Smoker  . Smokeless tobacco: Never Used  Vaping Use  . Vaping Use: Never used  Substance and  Sexual Activity  . Alcohol use: No  . Drug use: No  . Sexual activity: Yes    Birth control/protection: None  Other Topics Concern  . Not on file  Social History Narrative  . Not on file   Social Determinants of Health   Financial Resource Strain: Not on file  Food Insecurity: Not on file  Transportation Needs: Not on file  Physical Activity: Not on file  Stress: Not on file  Social Connections: Not on file  Intimate Partner Violence: Not on file   No current facility-administered medications on file prior to encounter.   Current Outpatient Medications on File Prior to Encounter  Medication Sig Dispense Refill  . amoxicillin (AMOXIL) 500 MG capsule Take 1 capsule (500 mg total) by mouth 2 (two) times daily. 14 capsule 0  . cetirizine (ZYRTEC) 10 MG tablet Take 1 tablet (10 mg total) by mouth daily. 30 tablet 0  . Prenatal Vit-Fe Fumarate-FA (PRENATAL COMPLETE) 14-0.4 MG TABS Take 1 tablet by mouth daily. 60 tablet 0  . [DISCONTINUED] fluticasone (FLONASE) 50 MCG/ACT nasal spray Place 1 spray into both nostrils daily. 16 g 0  . [DISCONTINUED] LO LOESTRIN FE 1 MG-10 MCG / 10 MCG tablet Take 1 tablet by mouth daily. 1 Package 11  . [DISCONTINUED] medroxyPROGESTERone (DEPO-PROVERA) 150 MG/ML injection Inject 1 mL (150 mg total) into the muscle every 3 (three) months. (Patient not taking: Reported on 11/14/2018) 1 mL 4   No Known Allergies  I have reviewed patient's Past Medical Hx, Surgical  Hx, Family Hx, Social Hx, medications and allergies.   ROS:  Review of Systems  Constitutional: Negative for fever.  Gastrointestinal: Positive for abdominal pain. Negative for constipation, diarrhea, nausea and vomiting.  Genitourinary: Positive for pelvic pain and vaginal bleeding. Negative for dysuria.   Review of Systems  Other systems negative   Physical Exam  Physical Exam Patient Vitals for the past 24 hrs:  BP Temp Temp src Pulse Resp Height Weight  04/28/21 2217 (!) 99/59 97.7  F (36.5 C) Oral 77 20 4\' 11"  (1.499 m) 62.6 kg   Constitutional: Well-developed, well-nourished female in no acute distress.  Cardiovascular: normal rate Respiratory: normal effort GI: Abd soft, non-tender. Pos BS x 4 MS: Extremities nontender, no edema, normal ROM Neurologic: Alert and oriented x 4.  GU: Neg CVAT.  PELVIC EXAM: deferred due to recent pelvic exam, and bleeding not being heavy.  LAB RESULTS No results found for this or any previous visit (from the past 24 hour(s)).    IMAGING OB Transvaginal  Addendum Date: 04/29/2021   ADDENDUM REPORT: 04/29/2021 01:32 ADDENDUM: Per practitioner, patient history includes likely passed products of conception. History and findings consistent with an missed miscarriage. Recommend trending of beta HCG as well as repeat ultrasound in 7-10 days. These results were called by telephone at the time of interpretation on 04/29/2021 at 1:29 am to provider [facility] , who verbally acknowledged these results. Electronically Signed   By: [facility] M.D.   On: 04/29/2021 01:32   Result Date: 04/29/2021 CLINICAL DATA:  Vaginal bleeding. Last menstrual period 8 weeks and 5 days. Estimated due date 12/04/2021. Last menstrual period 02/27/2021. EXAM: OBSTETRIC <14 WK 03/01/2021 AND TRANSVAGINAL OB US TECHNIQUE: Both transabdominal and transvaginal ultrasound examinations were performed for complete evaluation of the gestation as well as the maternal uterus, adnexal regions, and pelvic cul-de-sac. Transvaginal technique was performed to assess early pregnancy. COMPARISON:  Ultrasound Ob 04/15/2021. FINDINGS: Intrauterine gestational sac: Single Yolk sac:  Visualized. Embryo:  Not Visualized. Cardiac Activity: Not Visualized. MSD: 13.7 mm   6 w   1 d Subchorionic hemorrhage:  None visualized. Maternal uterus/adnexae: Bilateral ovaries are unremarkable. The left ovary is only visualized on the transabdominal view. IMPRESSION: Previously visualized embryo on  ultrasound Ob 04/15/2021 no longer visualized within the gestational sac. Mean sac diameter correlates to a gestational age of [redacted] weeks and 1 day which is non-concordant with the gestational age by last menstrual period and is unchanged from the given gestational age by ultrasound on 04/15/2021. Findings meet definitive criteria for failed pregnancy. This follows SRU consensus guidelines: Diagnostic Criteria for Nonviable Pregnancy Early in the First Trimester. 06/15/2021 J Med [phone]. Electronically Signed: By: [id number] M.D. On: 04/29/2021 01:25    MAU Management/MDM: Ordered followup Ultrasound to rule out SAB  This did reflect loss of previously seen embryo.  Discussed with patient and her spouse with interpretor ipad.   She states understanding but is concerned because "I was here before with bleeding and they didn't do anything" I discussed there is nothing they could have done to predict or prevent this loss.   ASSESSMENT 1. SAB (spontaneous abortion)   2. Bleeding in early pregnancy     PLAN Discharge home Messaged office to cancel RN appt and change 6/7 appt with MD to a followup appt Bleeding precautions Encouraged to return if she develops worsening of symptoms, increase in pain, fever, or other concerning symptoms.       Follow-up  Information    [facility] Follow up.   Specialty: Obstetrics and Gynecology Contact information: [address] [place] [address] [phone]             Pt stable at time of discharge. Encouraged to return here if she develops worsening of symptoms, increase in pain, fever, or other concerning symptoms.    Wynelle Bourgeois CNM, MSN Certified Nurse-Midwife 04/29/2021  2:35 AM

## 2021-05-13 ENCOUNTER — Encounter: Payer: Medicaid Other | Admitting: Obstetrics and Gynecology

## 2021-05-13 ENCOUNTER — Ambulatory Visit: Payer: Medicaid Other | Admitting: Obstetrics and Gynecology

## 2021-06-25 ENCOUNTER — Encounter (HOSPITAL_COMMUNITY): Payer: Self-pay | Admitting: Emergency Medicine

## 2021-06-25 ENCOUNTER — Other Ambulatory Visit: Payer: Self-pay

## 2021-06-25 ENCOUNTER — Ambulatory Visit (HOSPITAL_COMMUNITY)
Admission: EM | Admit: 2021-06-25 | Discharge: 2021-06-25 | Disposition: A | Discharge status: Home / Self Care | Payer: Medicaid Other | Attending: Internal Medicine | Admitting: Internal Medicine

## 2021-06-25 DIAGNOSIS — R110 Nausea: Secondary | ICD-10-CM

## 2021-06-25 DIAGNOSIS — Z3201 Encounter for pregnancy test, result positive: Secondary | ICD-10-CM | POA: Diagnosis not present

## 2021-06-25 DIAGNOSIS — O0001 Abdominal pregnancy with intrauterine pregnancy: Secondary | ICD-10-CM

## 2021-06-25 LAB — POCT URINALYSIS DIPSTICK, ED / UC
Bilirubin Urine: NEGATIVE
Glucose, UA: NEGATIVE mg/dL
Hgb urine dipstick: NEGATIVE
Leukocytes,Ua: NEGATIVE
Nitrite: NEGATIVE
Protein, ur: NEGATIVE mg/dL
Specific Gravity, Urine: 1.025 (ref 1.005–1.030)
Urobilinogen, UA: 0.2 mg/dL (ref 0.0–1.0)
pH: 7 (ref 5.0–8.0)

## 2021-06-25 LAB — POC URINE PREG, ED: Preg Test, Ur: POSITIVE — AB

## 2021-06-25 MED ORDER — ONDANSETRON 4 MG PO TBDP
4.0000 mg | ORAL_TABLET | Freq: Once | ORAL | Status: AC
  Administered 2021-06-25: 4 mg via ORAL

## 2021-06-25 MED ORDER — ONDANSETRON 4 MG PO TBDP: 4.0000 mg | ORAL_TABLET | Freq: Three times a day (TID) | ORAL | 0 refills | Status: DC | PRN

## 2021-06-25 MED ORDER — ONDANSETRON 4 MG PO TBDP
ORAL_TABLET | ORAL | Status: AC
  Filled 2021-06-25: qty 1

## 2021-06-25 NOTE — ED Provider Notes (Signed)
[facility]    CSN: [id number] Arrival date & time: 06/25/21  1658      History   Chief Complaint Chief Complaint  Patient presents with   Nausea    HPI ALTHERIA SHADOAN is a 30 y.o. female who presents with persistent nausea and vomiting x 3 weeks and has been having epigastric pain and hunger pains, and after she eats, within 5 minutes the pain comes back. Has not tried any antiacids. LMP 7/3 but was abnormal for her, she bleed the 3rd and the 5th only, but did not have severe cramps like when she had a her 2 miscarriages this year. Last time 2 months ago. She denies hematemesis, melena or blood in her stools. Pt states it does not matter what she eats it causes her to vomit. Vomited x 3 today. On average 3-4 times a day.    Past Medical History:  Diagnosis Date   Medical history non-contributory     Patient Active Problem List   Diagnosis Date Noted   SAB (spontaneous abortion) 04/29/2021   Overdose 12/19/2020   MDD (major depressive disorder), recurrent episode, severe (HCC) 07/08/2018   DIC (disseminated intravascular coagulation) (HCC) 06/13/2018   Antepartum placental abruption 06/13/2018   PPH (postpartum hemorrhage) 06/13/2018   Language barrier 06/13/2018   Supervision of high risk pregnancy, antepartum 11/23/2016   History of preterm delivery 11/23/2016    Past Surgical History:  Procedure Laterality Date   CESAREAN SECTION N/A 06/13/2018   Procedure: CESAREAN SECTION;  Surgeon: [place] Bing, MD;  Location: [facility] BIRTHING SUITES;  Service: Obstetrics;  Laterality: N/A;   MOUTH SURGERY     WISDOM TOOTH EXTRACTION Bilateral     OB History     Gravida  5   Para  4   Term  0   Preterm  4   AB  0   Living  4      SAB  0   IAB  0   Ectopic  0   Multiple  0   Live Births  4            Home Medications    Prior to Admission medications   Medication Sig Start Date End Date Taking? Authorizing Provider  ondansetron (ZOFRAN  ODT) 4 MG disintegrating tablet Take 1 tablet (4 mg total) by mouth every 8 (eight) hours as needed for nausea or vomiting. 06/25/21  Yes Rodriguez-Southworth, Nettie Elm, PA-C  cetirizine (ZYRTEC) 10 MG tablet Take 1 tablet (10 mg total) by mouth daily. 01/02/21 02/01/21  Georgetta Haber, NP  Prenatal Vit-Fe Fumarate-FA (PRENATAL COMPLETE) 14-0.4 MG TABS Take 1 tablet by mouth daily. 03/31/21   Ivette Loyal, NP  fluticasone (FLONASE) 50 MCG/ACT nasal spray Place 1 spray into both nostrils daily. 01/02/21 04/25/21  Burky, Barron Alvine, NP  LO LOESTRIN FE 1 MG-10 MCG / 10 MCG tablet Take 1 tablet by mouth daily. 03/06/20 07/06/20  Brock Bad, MD  medroxyPROGESTERone (DEPO-PROVERA) 150 MG/ML injection Inject 1 mL (150 mg total) into the muscle every 3 (three) months. Patient not taking: Reported on 11/14/2018 07/18/18 07/06/20  Brock Bad, MD    Family History Family History  Problem Relation Age of Onset   Healthy Father    Alcohol abuse Neg Hx    Arthritis Neg Hx    Asthma Neg Hx    Birth defects Neg Hx    Cancer Neg Hx    COPD Neg Hx    Depression Neg  Hx    Diabetes Neg Hx    Drug abuse Neg Hx    Early death Neg Hx    Hearing loss Neg Hx    Heart disease Neg Hx    Hyperlipidemia Neg Hx    Hypertension Neg Hx    Kidney disease Neg Hx    Learning disabilities Neg Hx    Mental illness Neg Hx    Mental retardation Neg Hx    Miscarriages / Stillbirths Neg Hx    Stroke Neg Hx    Vision loss Neg Hx    Varicose Veins Neg Hx     Social History Social History   Tobacco Use   Smoking status: Never   Smokeless tobacco: Never  Vaping Use   Vaping Use: Never used  Substance Use Topics   Alcohol use: No   Drug use: No     Allergies   Patient has no known allergies.   Review of Systems Review of Systems + N/V, weakness and epigastric pain. Denies fever, chills, dysuriasweats, body aches, or HA.   Physical Exam Triage Vital Signs ED Triage Vitals  Enc Vitals Group      BP 06/25/21 1840 97/63     Pulse Rate 06/25/21 1840 77     Resp 06/25/21 1840 18     Temp 06/25/21 1840 98.7 F (37.1 C)     Temp Source 06/25/21 1840 Oral     SpO2 06/25/21 1840 98 %     Weight --      Height --      Head Circumference --      Peak Flow --      Pain Score 06/25/21 1839 10     Pain Loc --      Pain Edu? --      Excl. in GC? --    No data found.  Updated Vital Signs BP 97/63 (BP Location: Left Arm)   Pulse 77   Temp 98.7 F (37.1 C) (Oral)   Resp 18   LMP 06/08/2021 (Approximate)   SpO2 98%   Breastfeeding Unknown   Visual Acuity Right Eye Distance:   Left Eye Distance:   Bilateral Distance:    Right Eye Near:   Left Eye Near:    Bilateral Near:     Physical Exam Vitals and nursing note reviewed.  Constitutional:      General: She is not in acute distress.    Appearance: Normal appearance. She is normal weight. She is not ill-appearing or toxic-appearing.  HENT:     Head: Normocephalic.     Right Ear: External ear normal.     Left Ear: External ear normal.     Mouth/Throat:     Pharynx: Oropharynx is clear.  Eyes:     General: No scleral icterus.    Conjunctiva/sclera: Conjunctivae normal.  Pulmonary:     Effort: Pulmonary effort is normal.  Abdominal:     General: Bowel sounds are normal.     Palpations: Abdomen is soft. There is no mass.     Tenderness: There is abdominal tenderness. There is no guarding or rebound.     Hernia: No hernia is present.     Comments: Has mild tenderness on epigastric and RUQ areas  Musculoskeletal:        General: Normal range of motion.     Cervical back: Neck supple.  Skin:    General: Skin is warm and dry.     Findings: No rash.  Neurological:  Mental Status: She is alert and oriented to person, place, and time.     Gait: Gait normal.  Psychiatric:        Mood and Affect: Mood normal.        Behavior: Behavior normal.        Thought Content: Thought content normal.        Judgment: Judgment  normal.     UC Treatments / Results  Labs (all labs ordered are listed, but only abnormal results are displayed) Labs Reviewed  POCT URINALYSIS DIPSTICK, ED / UC - Abnormal; Notable for the following components:      Result Value   Ketones, ur TRACE (*)    All other components within normal limits  POC URINE PREG, ED - Abnormal; Notable for the following components:   Preg Test, Ur POSITIVE (*)    All other components within normal limits    EKG   Radiology No results found.  Procedures Procedures (including critical care time)  Medications Ordered in UC Medications  ondansetron (ZOFRAN-ODT) disintegrating tablet 4 mg (4 mg Oral Given 06/25/21 1909)    Initial Impression / Assessment and Plan / UC Course  I have reviewed the triage vital signs and the nursing notes. Pertinent labs  results that were available during my care of the patient were reviewed by me and considered in my medical decision making (see chart for details). Pregnancy, possibly 8 weeks. She may also have gastritis.  She was given Zofran ODT 4 mg here and this helped her with the nausea and I sent some more for her to use at home. She needs to call OB MD tomorrow am due to her hx of miscarriages. I told her to take  TUMS prn. Pt understands.     Final Clinical Impressions(s) / UC Diagnoses   Final diagnoses:  Nausea  Abdominal pregnancy with intrauterine pregnancy   Discharge Instructions   None    ED Prescriptions     Medication Sig Dispense Auth. Provider   ondansetron (ZOFRAN ODT) 4 MG disintegrating tablet Take 1 tablet (4 mg total) by mouth every 8 (eight) hours as needed for nausea or vomiting. 20 tablet Rodriguez-Southworth, Nettie Elm, PA-C      PDMP not reviewed this encounter.   Garey Ham, Cordelia Poche 06/25/21 1923

## 2021-06-25 NOTE — ED Triage Notes (Signed)
Patient c/o Nausea x 3 weeks.   Patient states " I can't keep food down and I feel unsteady".   Patient endorses increased saliva production in mouth.   Patient has mid upper ABD pain and described pain as "burning".   LMP: 06/08/2021

## 2021-07-03 DIAGNOSIS — Z113 Encounter for screening for infections with a predominantly sexual mode of transmission: Secondary | ICD-10-CM | POA: Diagnosis not present

## 2021-07-03 DIAGNOSIS — Z0001 Encounter for general adult medical examination with abnormal findings: Secondary | ICD-10-CM | POA: Diagnosis not present

## 2021-07-03 DIAGNOSIS — Z8759 Personal history of other complications of pregnancy, childbirth and the puerperium: Secondary | ICD-10-CM | POA: Diagnosis not present

## 2021-07-03 DIAGNOSIS — Z0000 Encounter for general adult medical examination without abnormal findings: Secondary | ICD-10-CM | POA: Diagnosis not present

## 2021-07-03 DIAGNOSIS — Z7251 High risk heterosexual behavior: Secondary | ICD-10-CM | POA: Diagnosis not present

## 2021-07-03 DIAGNOSIS — Z124 Encounter for screening for malignant neoplasm of cervix: Secondary | ICD-10-CM | POA: Diagnosis not present

## 2021-07-03 DIAGNOSIS — K117 Disturbances of salivary secretion: Secondary | ICD-10-CM | POA: Diagnosis not present

## 2021-07-03 DIAGNOSIS — Z01411 Encounter for gynecological examination (general) (routine) with abnormal findings: Secondary | ICD-10-CM | POA: Diagnosis not present

## 2021-07-03 DIAGNOSIS — Z3201 Encounter for pregnancy test, result positive: Secondary | ICD-10-CM | POA: Diagnosis not present

## 2021-07-04 DIAGNOSIS — Z124 Encounter for screening for malignant neoplasm of cervix: Secondary | ICD-10-CM | POA: Diagnosis not present

## 2021-07-07 DIAGNOSIS — Z8759 Personal history of other complications of pregnancy, childbirth and the puerperium: Secondary | ICD-10-CM | POA: Diagnosis not present

## 2021-07-09 ENCOUNTER — Encounter (HOSPITAL_COMMUNITY): Payer: Self-pay | Admitting: *Deleted

## 2021-07-09 ENCOUNTER — Ambulatory Visit (HOSPITAL_COMMUNITY)
Admission: EM | Admit: 2021-07-09 | Discharge: 2021-07-09 | Disposition: A | Discharge status: Home / Self Care | Payer: Medicaid Other | Attending: Internal Medicine | Admitting: Internal Medicine

## 2021-07-09 ENCOUNTER — Other Ambulatory Visit: Payer: Self-pay

## 2021-07-09 DIAGNOSIS — O2341 Unspecified infection of urinary tract in pregnancy, first trimester: Secondary | ICD-10-CM | POA: Insufficient documentation

## 2021-07-09 DIAGNOSIS — Z3A00 Weeks of gestation of pregnancy not specified: Secondary | ICD-10-CM

## 2021-07-09 LAB — POCT URINALYSIS DIPSTICK, ED / UC
Bilirubin Urine: NEGATIVE
Glucose, UA: NEGATIVE mg/dL
Hgb urine dipstick: NEGATIVE
Nitrite: NEGATIVE
Protein, ur: NEGATIVE mg/dL
Specific Gravity, Urine: 1.025 (ref 1.005–1.030)
Urobilinogen, UA: 1 mg/dL (ref 0.0–1.0)
pH: 6.5 (ref 5.0–8.0)

## 2021-07-09 MED ORDER — CEPHALEXIN 500 MG PO CAPS: 500.0000 mg | ORAL_CAPSULE | Freq: Four times a day (QID) | ORAL | 0 refills | Status: DC

## 2021-07-09 NOTE — ED Provider Notes (Addendum)
[facility]    CSN: [id number] Arrival date & time: 07/09/21  1632      History   Chief Complaint Chief Complaint  Patient presents with   Dysuria    HPI Maria Riley is a 30 y.o. female.   Pt complains of increased vaginal discharge and burning with urination that started earlier today.  Positive pregnancy test at last visit 7/20, about 8 weeks at that time.  She reports she has an upcoming appointment with OB.  H/o miscarriage in the past.  Denies abdominal pain, pelvic pain, hematuria, or vaginal bleeding.    Past Medical History:  Diagnosis Date   Medical history non-contributory     Patient Active Problem List   Diagnosis Date Noted   SAB (spontaneous abortion) 04/29/2021   Overdose 12/19/2020   MDD (major depressive disorder), recurrent episode, severe (HCC) 07/08/2018   DIC (disseminated intravascular coagulation) (HCC) 06/13/2018   Antepartum placental abruption 06/13/2018   PPH (postpartum hemorrhage) 06/13/2018   Language barrier 06/13/2018   Supervision of high risk pregnancy, antepartum 11/23/2016   History of preterm delivery 11/23/2016    Past Surgical History:  Procedure Laterality Date   CESAREAN SECTION N/A 06/13/2018   Procedure: CESAREAN SECTION;  Surgeon: [place] Bing, MD;  Location: [facility] BIRTHING SUITES;  Service: Obstetrics;  Laterality: N/A;   MOUTH SURGERY     WISDOM TOOTH EXTRACTION Bilateral     OB History     Gravida  5   Para  4   Term  0   Preterm  4   AB  0   Living  4      SAB  0   IAB  0   Ectopic  0   Multiple  1   Live Births  4            Home Medications    Prior to Admission medications   Medication Sig Start Date End Date Taking? Authorizing Provider  cetirizine (ZYRTEC) 10 MG tablet Take 1 tablet (10 mg total) by mouth daily. 01/02/21 02/01/21  Georgetta Haber, NP  ondansetron (ZOFRAN ODT) 4 MG disintegrating tablet Take 1 tablet (4 mg total) by mouth every 8 (eight) hours as needed  for nausea or vomiting. 06/25/21   Rodriguez-Southworth, Nettie Elm, PA-C  Prenatal Vit-Fe Fumarate-FA (PRENATAL COMPLETE) 14-0.4 MG TABS Take 1 tablet by mouth daily. 03/31/21   Ivette Loyal, NP  fluticasone (FLONASE) 50 MCG/ACT nasal spray Place 1 spray into both nostrils daily. 01/02/21 04/25/21  Burky, Barron Alvine, NP  LO LOESTRIN FE 1 MG-10 MCG / 10 MCG tablet Take 1 tablet by mouth daily. 03/06/20 07/06/20  Brock Bad, MD  medroxyPROGESTERone (DEPO-PROVERA) 150 MG/ML injection Inject 1 mL (150 mg total) into the muscle every 3 (three) months. Patient not taking: Reported on 11/14/2018 07/18/18 07/06/20  Brock Bad, MD    Family History Family History  Problem Relation Age of Onset   Healthy Father    Alcohol abuse Neg Hx    Arthritis Neg Hx    Asthma Neg Hx    Birth defects Neg Hx    Cancer Neg Hx    COPD Neg Hx    Depression Neg Hx    Diabetes Neg Hx    Drug abuse Neg Hx    Early death Neg Hx    Hearing loss Neg Hx    Heart disease Neg Hx    Hyperlipidemia Neg Hx    Hypertension Neg Hx  Kidney disease Neg Hx    Learning disabilities Neg Hx    Mental illness Neg Hx    Mental retardation Neg Hx    Miscarriages / Stillbirths Neg Hx    Stroke Neg Hx    Vision loss Neg Hx    Varicose Veins Neg Hx     Social History Social History   Tobacco Use   Smoking status: Never   Smokeless tobacco: Never  Vaping Use   Vaping Use: Never used  Substance Use Topics   Alcohol use: No   Drug use: No     Allergies   Patient has no known allergies.   Review of Systems Review of Systems  Constitutional:  Negative for chills and fever.  HENT:  Negative for ear pain and sore throat.   Eyes:  Negative for pain and visual disturbance.  Respiratory:  Negative for cough and shortness of breath.   Cardiovascular:  Negative for chest pain and palpitations.  Gastrointestinal:  Negative for abdominal pain and vomiting.  Genitourinary:  Positive for dysuria and vaginal  discharge. Negative for hematuria, pelvic pain, vaginal bleeding and vaginal pain.  Musculoskeletal:  Negative for arthralgias and back pain.  Skin:  Negative for color change and rash.  Neurological:  Negative for seizures and syncope.  All other systems reviewed and are negative.   Physical Exam Triage Vital Signs ED Triage Vitals  Enc Vitals Group     BP 07/09/21 1813 (!) 102/54     Pulse Rate 07/09/21 1813 73     Resp 07/09/21 1813 18     Temp 07/09/21 1813 99.2 F (37.3 C)     Temp src --      SpO2 07/09/21 1813 100 %     Weight --      Height --      Head Circumference --      Peak Flow --      Pain Score 07/09/21 1815 8     Pain Loc --      Pain Edu? --      Excl. in GC? --    No data found.  Updated Vital Signs BP (!) 102/54   Pulse 73   Temp 99.2 F (37.3 C)   Resp 18   LMP 02/27/2021 (Approximate)   SpO2 100%   Visual Acuity Right Eye Distance:   Left Eye Distance:   Bilateral Distance:    Right Eye Near:   Left Eye Near:    Bilateral Near:     Physical Exam Vitals and nursing note reviewed.  Constitutional:      General: She is not in acute distress.    Appearance: She is well-developed.  HENT:     Head: Normocephalic and atraumatic.  Eyes:     Conjunctiva/sclera: Conjunctivae normal.  Cardiovascular:     Rate and Rhythm: Normal rate and regular rhythm.     Heart sounds: No murmur heard. Pulmonary:     Effort: Pulmonary effort is normal. No respiratory distress.     Breath sounds: Normal breath sounds.  Abdominal:     Palpations: Abdomen is soft.     Tenderness: There is no abdominal tenderness.  Musculoskeletal:     Cervical back: Neck supple.  Skin:    General: Skin is warm and dry.  Neurological:     Mental Status: She is alert.     UC Treatments / Results  Labs (all labs ordered are listed, but only abnormal results are displayed) Labs Reviewed -  No data to display  EKG   Radiology No results  found.  Procedures Procedures (including critical care time)  Medications Ordered in UC Medications - No data to display  Initial Impression / Assessment and Plan / UC Course  I have reviewed the triage vital signs and the nursing notes.  Pertinent labs & imaging results that were available during my care of the patient were reviewed by me and considered in my medical decision making (see chart for details).     Will treat UTI. Pt seen 06/25/21, labs reviewed from this visit, positive pregnancy test.  Advised to keep follow up with OBGYN. Cervicovaginal swab sent out due to reports of increased discharge. Return precautions discussed.  Final Clinical Impressions(s) / UC Diagnoses   Final diagnoses:  None   Discharge Instructions   None    ED Prescriptions   None    PDMP not reviewed this encounter.   Jodell Cipro, PA-C 07/09/21 1919    Jodell Cipro, PA-C 07/09/21 2013

## 2021-07-09 NOTE — Discharge Instructions (Signed)
Take medication as prescribed Keep upcoming appointment with OBGYN

## 2021-07-09 NOTE — ED Triage Notes (Signed)
PT reports dysuria started today.

## 2021-07-10 LAB — CERVICOVAGINAL ANCILLARY ONLY
Bacterial Vaginitis (gardnerella): NEGATIVE
Chlamydia: NEGATIVE
Comment: NEGATIVE
Comment: NEGATIVE
Comment: NEGATIVE
Comment: NORMAL
Neisseria Gonorrhea: NEGATIVE
Trichomonas: NEGATIVE

## 2021-07-12 ENCOUNTER — Encounter (HOSPITAL_COMMUNITY): Payer: Self-pay | Admitting: Obstetrics & Gynecology

## 2021-07-12 ENCOUNTER — Inpatient Hospital Stay (HOSPITAL_COMMUNITY)
Admission: AD | Admit: 2021-07-12 | Discharge: 2021-07-13 | Disposition: A | Discharge status: Home / Self Care | Payer: Medicaid Other | Attending: Obstetrics & Gynecology | Admitting: Obstetrics & Gynecology

## 2021-07-12 ENCOUNTER — Other Ambulatory Visit: Payer: Self-pay

## 2021-07-12 DIAGNOSIS — O26891 Other specified pregnancy related conditions, first trimester: Secondary | ICD-10-CM | POA: Insufficient documentation

## 2021-07-12 DIAGNOSIS — O219 Vomiting of pregnancy, unspecified: Secondary | ICD-10-CM | POA: Insufficient documentation

## 2021-07-12 DIAGNOSIS — Z8759 Personal history of other complications of pregnancy, childbirth and the puerperium: Secondary | ICD-10-CM | POA: Insufficient documentation

## 2021-07-12 DIAGNOSIS — R519 Headache, unspecified: Secondary | ICD-10-CM | POA: Insufficient documentation

## 2021-07-12 DIAGNOSIS — O210 Mild hyperemesis gravidarum: Secondary | ICD-10-CM

## 2021-07-12 DIAGNOSIS — Z3A01 Less than 8 weeks gestation of pregnancy: Secondary | ICD-10-CM | POA: Insufficient documentation

## 2021-07-12 LAB — URINALYSIS, ROUTINE W REFLEX MICROSCOPIC
Bacteria, UA: NONE SEEN
Bilirubin Urine: NEGATIVE
Glucose, UA: NEGATIVE mg/dL
Hgb urine dipstick: NEGATIVE
Ketones, ur: 20 mg/dL — AB
Nitrite: NEGATIVE
Protein, ur: NEGATIVE mg/dL
Specific Gravity, Urine: 1.02 (ref 1.005–1.030)
pH: 7 (ref 5.0–8.0)

## 2021-07-12 NOTE — MAU Note (Addendum)
..  Maria Riley is a 30 y.o. at [redacted]w[redacted]d here in MAU reporting: Has vomited 6-7 times in the past 24 hours. Has been spitting a lot too. Took medicine but it did not help.  Headache that began this morning and has gotten worse all day.   Pain score: 10/10 Vitals:   07/12/21 2308  BP: 107/61  Pulse: 93  Resp: 16  Temp: 99.1 F (37.3 C)  SpO2: 99%      Lab orders placed from triage: UA

## 2021-07-13 ENCOUNTER — Encounter (HOSPITAL_COMMUNITY): Payer: Self-pay

## 2021-07-13 DIAGNOSIS — Z8759 Personal history of other complications of pregnancy, childbirth and the puerperium: Secondary | ICD-10-CM | POA: Diagnosis not present

## 2021-07-13 DIAGNOSIS — R519 Headache, unspecified: Secondary | ICD-10-CM | POA: Diagnosis not present

## 2021-07-13 DIAGNOSIS — O26891 Other specified pregnancy related conditions, first trimester: Secondary | ICD-10-CM | POA: Diagnosis not present

## 2021-07-13 DIAGNOSIS — O219 Vomiting of pregnancy, unspecified: Secondary | ICD-10-CM | POA: Diagnosis not present

## 2021-07-13 DIAGNOSIS — O2341 Unspecified infection of urinary tract in pregnancy, first trimester: Secondary | ICD-10-CM | POA: Diagnosis present

## 2021-07-13 DIAGNOSIS — Z3A01 Less than 8 weeks gestation of pregnancy: Secondary | ICD-10-CM | POA: Diagnosis not present

## 2021-07-13 MED ORDER — BUTALBITAL-APAP-CAFFEINE 50-325-40 MG PO TABS: 2.0000 | ORAL_TABLET | ORAL | 0 refills | Status: DC | PRN

## 2021-07-13 MED ORDER — ONDANSETRON 4 MG PO TBDP
8.0000 mg | ORAL_TABLET | Freq: Once | ORAL | Status: AC
  Administered 2021-07-13: 8 mg via ORAL
  Filled 2021-07-13: qty 2

## 2021-07-13 MED ORDER — GLYCOPYRROLATE 1 MG PO TABS: 1.0000 mg | ORAL_TABLET | Freq: Three times a day (TID) | ORAL | 1 refills | Status: DC

## 2021-07-13 MED ORDER — BUTALBITAL-APAP-CAFFEINE 50-325-40 MG PO TABS
2.0000 | ORAL_TABLET | ORAL | Status: DC | PRN
  Administered 2021-07-13: 2 via ORAL
  Filled 2021-07-13: qty 2

## 2021-07-13 MED ORDER — ONDANSETRON 8 MG PO TBDP: 8.0000 mg | ORAL_TABLET | Freq: Three times a day (TID) | ORAL | 0 refills | Status: DC | PRN

## 2021-07-13 NOTE — MAU Provider Note (Addendum)
Patient Maria Riley is a 30 y.o. [phone]  At [redacted]w[redacted]d by LMP here with complaints of headache and nausea and vomiting. She denies vaginal bleeding, vaginal pain, abnormal discharge, fever, SOB, abdominal pain. She was recently seen in [facility] and diagnosed with a UTI; has not been taking her medicine.   She reports nausea and vomiting that is worsening, as well as HA today.   She has a history of miscarriage in May 2022 and stat c/s for NRFHT (possibly abruption) and subsequent DIC. Infant "sophia"  was taken off life support eventually.  History     CSN: [id number]  Arrival date and time: 07/12/21 2242   None     Chief Complaint  Patient presents with   Emesis   Headache   Emesis  This is a new problem. The current episode started in the past 7 days. The problem occurs 5 to 10 times per day. The problem has been unchanged. There has been no fever. Pertinent negatives include no abdominal pain, chest pain, chills, coughing, diarrhea or dizziness.  She also reports HA, 10/10; that is sharp. She denies visual changes, it does not radiate, nothing makes it better or worse. She has tried nothing for it.  OB History     Gravida  6   Para  4   Term  0   Preterm  4   AB  1   Living  3      SAB  1   IAB  0   Ectopic  0   Multiple  0   Live Births  4           Past Medical History:  Diagnosis Date   Medical history non-contributory     Past Surgical History:  Procedure Laterality Date   CESAREAN SECTION N/A 06/13/2018   Procedure: CESAREAN SECTION;  Surgeon: [place] Bing, MD;  Location: [facility] BIRTHING SUIT[facility];  Service: Obstetrics;  Laterality: N/A;   MOUTH SURGERY     WISDOM TOOTH EXTRACTION Bilateral     Family History  Problem Relation Age of Onset   Healthy Father    Alcohol abuse Neg Hx    Arthritis Neg Hx    Asthma Neg Hx    Birth defects Neg Hx    Cancer Neg Hx    COPD Neg Hx    Depression Neg Hx    Diabetes Neg Hx    Drug abuse Neg Hx    Early  death Neg Hx    Hearing loss Neg Hx    Heart disease Neg Hx    Hyperlipidemia Neg Hx    Hypertension Neg Hx    Kidney disease Neg Hx    Learning disabilities Neg Hx    Mental illness Neg Hx    Mental retardation Neg Hx    Miscarriages / Stillbirths Neg Hx    Stroke Neg Hx    Vision loss Neg Hx    Varicose Veins Neg Hx     Social History   Tobacco Use   Smoking status: Never   Smokeless tobacco: Never  Vaping Use   Vaping Use: Never used  Substance Use Topics   Alcohol use: No   Drug use: No    Allergies: No Known Allergies  Medications Prior to Admission  Medication Sig Dispense Refill Last Dose   cephALEXin (KEFLEX) 500 MG capsule Take 1 capsule (500 mg total) by mouth 4 (four) times daily for 5 days. 20 capsule 0 Past Week   ondansetron (  ZOFRAN ODT) 4 MG disintegrating tablet Take 1 tablet (4 mg total) by mouth every 8 (eight) hours as needed for nausea or vomiting. 20 tablet 0 07/12/2021   Prenatal Vit-Fe Fumarate-FA (PRENATAL COMPLETE) 14-0.4 MG TABS Take 1 tablet by mouth daily. 60 tablet 0 07/12/2021   cetirizine (ZYRTEC) 10 MG tablet Take 1 tablet (10 mg total) by mouth daily. 30 tablet 0     Review of Systems  Constitutional: Negative.  Negative for chills.  HENT: Negative.    Respiratory: Negative.  Negative for cough.   Cardiovascular: Negative.  Negative for chest pain.  Gastrointestinal:  Positive for vomiting. Negative for abdominal pain and diarrhea.  Neurological: Negative.  Negative for dizziness.  Physical Exam   Blood pressure 107/61, pulse 93, temperature 99.1 F (37.3 C), temperature source Oral, resp. rate 16, height 4\' 11"  (1.499 m), weight 60.6 kg, last menstrual period 06/08/2021, SpO2 99 %, unknown if currently breastfeeding.  Physical Exam Constitutional:      Appearance: She is well-developed.  HENT:     Head: Normocephalic.  Pulmonary:     Effort: Pulmonary effort is normal.  Abdominal:     Palpations: Abdomen is soft.  Skin:     General: Skin is warm and dry.  Neurological:     Mental Status: She is alert.    MAU Course  Procedures  MDM -UA normal, slight dehydration -headache and nausea resolved with fioricet and zofran -confirmed that patient goes to [facility]; she showed me a scheduled appt on her phone with [facility]  Patient intitally said she was 19 weeks; unable to obtain FHT. Bedside 08/09/2021 done to confirm viability  Pt informed that the ultrasound is considered a limited OB ultrasound and is not intended to be a complete ultrasound exam.  Patient also informed that the ultrasound is not being completed with the intent of assessing for fetal or placental anomalies or any pelvic abnormalities.  Explained that the purpose of today's ultrasound is to assess for  viability.  Patient acknowledges the purpose of the exam and the limitations of the study.  Active fetus with present cardiac activity noted.   Assessment and Plan   1. Morning sickness    -patient stable for discharge with Rx for Zofran and fioricet and Robinul -reviewed warning signs and when to return to MAU -keep upcoming appt at [facility] -do not take antibiotics, does not appear to be UTI based on UA and patient's complaint today -all questions answered [place] Maria Riley 07/13/2021, 2:47 AM

## 2021-07-25 DIAGNOSIS — O219 Vomiting of pregnancy, unspecified: Secondary | ICD-10-CM | POA: Diagnosis not present

## 2021-07-25 DIAGNOSIS — O3680X9 Pregnancy with inconclusive fetal viability, other fetus: Secondary | ICD-10-CM | POA: Diagnosis not present

## 2021-07-25 DIAGNOSIS — Z3A12 12 weeks gestation of pregnancy: Secondary | ICD-10-CM | POA: Diagnosis not present

## 2021-07-25 DIAGNOSIS — K2100 Gastro-esophageal reflux disease with esophagitis, without bleeding: Secondary | ICD-10-CM | POA: Diagnosis not present

## 2021-07-25 DIAGNOSIS — N912 Amenorrhea, unspecified: Secondary | ICD-10-CM | POA: Diagnosis not present

## 2021-08-18 DIAGNOSIS — Z3490 Encounter for supervision of normal pregnancy, unspecified, unspecified trimester: Secondary | ICD-10-CM | POA: Diagnosis not present

## 2021-08-18 DIAGNOSIS — Z113 Encounter for screening for infections with a predominantly sexual mode of transmission: Secondary | ICD-10-CM | POA: Diagnosis not present

## 2021-08-18 DIAGNOSIS — Z363 Encounter for antenatal screening for malformations: Secondary | ICD-10-CM | POA: Diagnosis not present

## 2021-08-18 DIAGNOSIS — N925 Other specified irregular menstruation: Secondary | ICD-10-CM | POA: Diagnosis not present

## 2021-08-18 DIAGNOSIS — Z331 Pregnant state, incidental: Secondary | ICD-10-CM | POA: Diagnosis not present

## 2021-09-04 ENCOUNTER — Ambulatory Visit (HOSPITAL_COMMUNITY)
Admission: EM | Admit: 2021-09-04 | Discharge: 2021-09-04 | Disposition: A | Discharge status: Home / Self Care | Payer: Medicaid Other | Attending: Student | Admitting: Student

## 2021-09-04 ENCOUNTER — Encounter (HOSPITAL_COMMUNITY): Payer: Self-pay

## 2021-09-04 ENCOUNTER — Other Ambulatory Visit: Payer: Self-pay

## 2021-09-04 DIAGNOSIS — Z789 Other specified health status: Secondary | ICD-10-CM | POA: Diagnosis not present

## 2021-09-04 DIAGNOSIS — O23592 Infection of other part of genital tract in pregnancy, second trimester: Secondary | ICD-10-CM

## 2021-09-04 DIAGNOSIS — B373 Candidiasis of vulva and vagina: Secondary | ICD-10-CM | POA: Diagnosis not present

## 2021-09-04 DIAGNOSIS — Z3A18 18 weeks gestation of pregnancy: Secondary | ICD-10-CM | POA: Diagnosis not present

## 2021-09-04 DIAGNOSIS — B3731 Acute candidiasis of vulva and vagina: Secondary | ICD-10-CM

## 2021-09-04 LAB — POCT URINALYSIS DIPSTICK, ED / UC
Bilirubin Urine: NEGATIVE
Glucose, UA: NEGATIVE mg/dL
Hgb urine dipstick: NEGATIVE
Ketones, ur: NEGATIVE mg/dL
Nitrite: NEGATIVE
Protein, ur: NEGATIVE mg/dL
Specific Gravity, Urine: 1.02 (ref 1.005–1.030)
Urobilinogen, UA: 1 mg/dL (ref 0.0–1.0)
pH: 7.5 (ref 5.0–8.0)

## 2021-09-04 MED ORDER — MICONAZOLE NITRATE 2 % VA CREA: 1.0000 | TOPICAL_CREAM | Freq: Every day | VAGINAL | 0 refills | Status: AC

## 2021-09-04 NOTE — ED Triage Notes (Signed)
Pt presents with vaginal itching and discharge x 3 days. States she has felt a burning feeling when urinating.

## 2021-09-04 NOTE — ED Provider Notes (Signed)
[facility]    CSN: [id number] Arrival date & time: 09/04/21  3818      History   Chief Complaint Chief Complaint  Patient presents with   Vaginal Itching   Dysuria   Vaginal Discharge    HPI Maria Riley is a 30 y.o. female presenting with vaginal itching and dysuria x3 days. Medical history current pregnancy. Describes white vaginal discharge and external vaginal itching and burning x3 days. Denies hematuria, dysuria, frequency, urgency, back pain, n/v/d/abd pain, fevers/chills. Denies STI risk.    HPI  Past Medical History:  Diagnosis Date   Medical history non-contributory     Patient Active Problem List   Diagnosis Date Noted   SAB (spontaneous abortion) 04/29/2021   Overdose 12/19/2020   MDD (major depressive disorder), recurrent episode, severe (HCC) 07/08/2018   DIC (disseminated intravascular coagulation) (HCC) 06/13/2018   Antepartum placental abruption 06/13/2018   PPH (postpartum hemorrhage) 06/13/2018   Language barrier 06/13/2018   Supervision of high risk pregnancy, antepartum 11/23/2016   History of preterm delivery 11/23/2016    Past Surgical History:  Procedure Laterality Date   CESAREAN SECTION N/A 06/13/2018   Procedure: CESAREAN SECTION;  Surgeon: Clifton Bing, MD;  Location: [facility] BIRTHING [facility];  Service: Obstetrics;  Laterality: N/A;   MOUTH SURGERY     WISDOM TOOTH EXTRACTION Bilateral     OB History     Gravida  6   Para  4   Term  0   Preterm  4   AB  1   Living  3      SAB  1   IAB  0   Ectopic  0   Multiple  0   Live Births  4            Home Medications    Prior to Admission medications   Medication Sig Start Date End Date Taking? Authorizing Provider  miconazole (MONISTAT 7) 2 % vaginal cream Place 1 Applicatorful vaginally at bedtime for 7 days. 09/04/21 09/11/21 Yes Rhys Martini, PA-C  butalbital-acetaminophen-caffeine (FIORICET) [phone] MG tablet Take 2 tablets by mouth every 4  (four) hours as needed for headache. 07/13/21   Marylene Land, CNM  cetirizine (ZYRTEC) 10 MG tablet Take 1 tablet (10 mg total) by mouth daily. 01/02/21 02/01/21  Georgetta Haber, NP  glycopyrrolate (ROBINUL) 1 MG tablet Take 1 tablet (1 mg total) by mouth 3 (three) times daily. 07/13/21   Marylene Land, CNM  ondansetron (ZOFRAN-ODT) 8 MG disintegrating tablet Take 1 tablet (8 mg total) by mouth every 8 (eight) hours as needed for nausea or vomiting. 07/13/21   Marylene Land, CNM  Prenatal Vit-Fe Fumarate-FA (PRENATAL COMPLETE) 14-0.4 MG TABS Take 1 tablet by mouth daily. 03/31/21   Ivette Loyal, NP  fluticasone (FLONASE) 50 MCG/ACT nasal spray Place 1 spray into both nostrils daily. 01/02/21 04/25/21  Burky, Barron Alvine, NP  LO LOESTRIN FE 1 MG-10 MCG / 10 MCG tablet Take 1 tablet by mouth daily. 03/06/20 07/06/20  Brock Bad, MD  medroxyPROGESTERone (DEPO-PROVERA) 150 MG/ML injection Inject 1 mL (150 mg total) into the muscle every 3 (three) months. Patient not taking: Reported on 11/14/2018 07/18/18 07/06/20  Brock Bad, MD    Family History Family History  Problem Relation Age of Onset   Healthy Father    Alcohol abuse Neg Hx    Arthritis Neg Hx    Asthma Neg Hx    Birth defects Neg  Hx    Cancer Neg Hx    COPD Neg Hx    Depression Neg Hx    Diabetes Neg Hx    Drug abuse Neg Hx    Early death Neg Hx    Hearing loss Neg Hx    Heart disease Neg Hx    Hyperlipidemia Neg Hx    Hypertension Neg Hx    Kidney disease Neg Hx    Learning disabilities Neg Hx    Mental illness Neg Hx    Mental retardation Neg Hx    Miscarriages / Stillbirths Neg Hx    Stroke Neg Hx    Vision loss Neg Hx    Varicose Veins Neg Hx     Social History Social History   Tobacco Use   Smoking status: Never   Smokeless tobacco: Never  Vaping Use   Vaping Use: Never used  Substance Use Topics   Alcohol use: No   Drug use: No     Allergies   Patient has no  known allergies.   Review of Systems Review of Systems  Constitutional:  Negative for chills and fever.  HENT:  Negative for sore throat.   Eyes:  Negative for pain and redness.  Respiratory:  Negative for shortness of breath.   Cardiovascular:  Negative for chest pain.  Gastrointestinal:  Negative for abdominal pain, diarrhea, nausea and vomiting.  Genitourinary:  Positive for vaginal discharge. Negative for decreased urine volume, difficulty urinating, dysuria, flank pain, frequency, genital sores, hematuria, menstrual problem, pelvic pain, urgency, vaginal bleeding and vaginal pain.  Musculoskeletal:  Negative for back pain.  Skin:  Negative for rash.  All other systems reviewed and are negative.   Physical Exam Triage Vital Signs ED Triage Vitals [09/04/21 1153]  Enc Vitals Group     BP 108/72     Pulse Rate 80     Resp 17     Temp 99.1 F (37.3 C)     Temp Source Oral     SpO2 100 %     Weight      Height      Head Circumference      Peak Flow      Pain Score 7     Pain Loc      Pain Edu?      Excl. in GC?    No data found.  Updated Vital Signs BP 108/72 (BP Location: Right Arm)   Pulse 80   Temp 99.1 F (37.3 C) (Oral)   Resp 17   LMP 06/08/2021 (Approximate)   SpO2 100%   Visual Acuity Right Eye Distance:   Left Eye Distance:   Bilateral Distance:    Right Eye Near:   Left Eye Near:    Bilateral Near:     Physical Exam Vitals reviewed.  Constitutional:      General: She is not in acute distress.    Appearance: Normal appearance. She is not ill-appearing.  HENT:     Head: Normocephalic and atraumatic.     Mouth/Throat:     Mouth: Mucous membranes are moist.     Comments: Moist mucous membranes Eyes:     Extraocular Movements: Extraocular movements intact.     Pupils: Pupils are equal, round, and reactive to light.  Cardiovascular:     Rate and Rhythm: Normal rate and regular rhythm.     Heart sounds: Normal heart sounds.  Pulmonary:      Effort: Pulmonary effort is normal.  Breath sounds: Normal breath sounds. No wheezing, rhonchi or rales.  Abdominal:     General: Bowel sounds are normal. There is no distension.     Palpations: Abdomen is soft. There is no mass.     Tenderness: There is no abdominal tenderness. There is no right CVA tenderness, left CVA tenderness, guarding or rebound.  Genitourinary:    Comments: deferred Skin:    General: Skin is warm.     Capillary Refill: Capillary refill takes less than 2 seconds.     Comments: Good skin turgor  Neurological:     General: No focal deficit present.     Mental Status: She is alert and oriented to person, place, and time.  Psychiatric:        Mood and Affect: Mood normal.        Behavior: Behavior normal.     UC Treatments / Results  Labs (all labs ordered are listed, but only abnormal results are displayed) Labs Reviewed  POCT URINALYSIS DIPSTICK, ED / UC - Abnormal; Notable for the following components:      Result Value   Leukocytes,Ua SMALL (*)    All other components within normal limits  CERVICOVAGINAL ANCILLARY ONLY    EKG   Radiology No results found.  Procedures Procedures (including critical care time)  Medications Ordered in UC Medications - No data to display  Initial Impression / Assessment and Plan / UC Course  I have reviewed the triage vital signs and the nursing notes.  Pertinent labs & imaging results that were available during my care of the patient were reviewed by me and considered in my medical decision making (see chart for details).     This patient is a very pleasant 30 y.o. year old female presenting with vaginitis. Afebrile, nontachycardic, no reproducible abd pain or CVAT.   Patient is currently pregnant 2nd trimester.  Cervicovaginal swab wnl 07/09/21. Denies STI risk. Will send self-swab for G/C, trich, yeast, BV testing. Declines HIV, RPR. Safe sex precautions.  UA with trace leuk, did not send culture.    Miconazole vaginal cream x7 days. F/u with OB.   ED return precautions discussed. Patient verbalizes understanding and agreement.   Spoke with this patient using language line.  Coding Level 4 for review of past notes/labs, order and interpretation of labs today, and prescription drug management    Final Clinical Impressions(s) / UC Diagnoses   Final diagnoses:  Vaginal candidiasis  [redacted] weeks gestation of pregnancy  Language barrier     Discharge Instructions      -Monistat cream at bedtime x7 days. Insert intravaginally. -Follow-up with OB if symptoms persist, and seek immediate medical attention if abdominal pain, vaginal bleeding     ED Prescriptions     Medication Sig Dispense Auth. Provider   miconazole (MONISTAT 7) 2 % vaginal cream Place 1 Applicatorful vaginally at bedtime for 7 days. 45 g Rhys Martini, PA-C      PDMP not reviewed this encounter.   Rhys Martini, PA-C 09/04/21 1232

## 2021-09-04 NOTE — Discharge Instructions (Addendum)
-  Monistat cream at bedtime x7 days. Insert intravaginally. -Follow-up with OB if symptoms persist, and seek immediate medical attention if abdominal pain, vaginal bleeding

## 2021-09-05 ENCOUNTER — Telehealth (HOSPITAL_COMMUNITY): Payer: Self-pay | Admitting: Emergency Medicine

## 2021-09-05 LAB — CERVICOVAGINAL ANCILLARY ONLY
Bacterial Vaginitis (gardnerella): POSITIVE — AB
Candida Glabrata: NEGATIVE
Candida Vaginitis: POSITIVE — AB
Chlamydia: NEGATIVE
Comment: NEGATIVE
Comment: NEGATIVE
Comment: NEGATIVE
Comment: NEGATIVE
Comment: NEGATIVE
Comment: NORMAL
Neisseria Gonorrhea: NEGATIVE
Trichomonas: NEGATIVE

## 2021-09-05 MED ORDER — METRONIDAZOLE 0.75 % VA GEL: 1.0000 | Freq: Every day | VAGINAL | 0 refills | Status: AC

## 2021-09-19 DIAGNOSIS — Z3A21 21 weeks gestation of pregnancy: Secondary | ICD-10-CM | POA: Diagnosis not present

## 2021-09-19 DIAGNOSIS — O3680X9 Pregnancy with inconclusive fetal viability, other fetus: Secondary | ICD-10-CM | POA: Diagnosis not present

## 2021-10-22 DIAGNOSIS — Z3686 Encounter for antenatal screening for cervical length: Secondary | ICD-10-CM | POA: Diagnosis not present

## 2021-10-22 DIAGNOSIS — Z363 Encounter for antenatal screening for malformations: Secondary | ICD-10-CM | POA: Diagnosis not present

## 2021-10-22 DIAGNOSIS — Z3A25 25 weeks gestation of pregnancy: Secondary | ICD-10-CM | POA: Diagnosis not present

## 2021-10-23 DIAGNOSIS — O321XX0 Maternal care for breech presentation, not applicable or unspecified: Secondary | ICD-10-CM | POA: Diagnosis not present

## 2021-10-23 DIAGNOSIS — O34211 Maternal care for low transverse scar from previous cesarean delivery: Secondary | ICD-10-CM | POA: Diagnosis not present

## 2021-10-23 DIAGNOSIS — Z3689 Encounter for other specified antenatal screening: Secondary | ICD-10-CM | POA: Diagnosis not present

## 2021-10-23 DIAGNOSIS — Z3A26 26 weeks gestation of pregnancy: Secondary | ICD-10-CM | POA: Diagnosis not present

## 2021-10-24 ENCOUNTER — Other Ambulatory Visit: Payer: Self-pay

## 2021-10-24 ENCOUNTER — Inpatient Hospital Stay (HOSPITAL_COMMUNITY)
Admission: AD | Admit: 2021-10-24 | Discharge: 2021-10-24 | Disposition: A | Discharge status: Home / Self Care | Payer: Medicaid Other | Attending: Obstetrics & Gynecology | Admitting: Obstetrics & Gynecology

## 2021-10-24 DIAGNOSIS — O0993 Supervision of high risk pregnancy, unspecified, third trimester: Secondary | ICD-10-CM | POA: Insufficient documentation

## 2021-10-24 DIAGNOSIS — O365920 Maternal care for other known or suspected poor fetal growth, second trimester, not applicable or unspecified: Secondary | ICD-10-CM | POA: Diagnosis not present

## 2021-10-24 DIAGNOSIS — Z8751 Personal history of pre-term labor: Secondary | ICD-10-CM | POA: Diagnosis not present

## 2021-10-24 DIAGNOSIS — Z3A25 25 weeks gestation of pregnancy: Secondary | ICD-10-CM

## 2021-10-24 DIAGNOSIS — O0990 Supervision of high risk pregnancy, unspecified, unspecified trimester: Secondary | ICD-10-CM

## 2021-10-24 DIAGNOSIS — Z3A35 35 weeks gestation of pregnancy: Secondary | ICD-10-CM | POA: Diagnosis not present

## 2021-10-24 MED ORDER — BETAMETHASONE SOD PHOS & ACET 6 (3-3) MG/ML IJ SUSP
12.0000 mg | Freq: Once | INTRAMUSCULAR | Status: AC
  Administered 2021-10-24: 12 mg via INTRAMUSCULAR
  Filled 2021-10-24: qty 5

## 2021-10-24 NOTE — MAU Provider Note (Signed)
None     S [name] is a 30 y.o. [phone] at [redacted]w[redacted]d who presents to MAU for BMZ. Patient with severe IUGR and history of PTD x4. Patient denies contractions, leaking fluid, or vaginal bleeding. Endorses active fetal movement.   O BP 96/65 (BP Location: Right Arm)   Pulse 87   Temp 98.5 F (36.9 C) (Oral)   Resp 18   Ht 4\' 11"  (1.499 m)   Wt 66.4 kg   LMP 06/08/2021 (Approximate)   SpO2 97%   BMI 29.57 kg/m   Physical Exam Vitals and nursing note reviewed.  Constitutional:      General: She is not in acute distress.    Appearance: She is normal weight.  Cardiovascular:     Rate and Rhythm: Normal rate.  Pulmonary:     Effort: Pulmonary effort is normal.  Skin:    General: Skin is warm.  Neurological:     General: No focal deficit present.     Mental Status: She is alert and oriented to person, place, and time.  Psychiatric:        Mood and Affect: Mood normal.        Behavior: Behavior normal.        Thought Content: Thought content normal.        Judgment: Judgment normal.   A Medical screening exam complete [redacted] weeks gestation of pregnancy BMZ   P Discharge from MAU in stable condition Warning signs for worsening condition that would warrant emergency follow-up discussed Patient may return to MAU as needed  Keep OB appointment as scheduled    08/09/2021, CNM 10/24/2021 2:51 PM

## 2021-10-24 NOTE — MAU Note (Signed)
Presents for BMZ injection.  Denies pain, LOF, and VB.  Endorses +FM.

## 2021-10-25 ENCOUNTER — Other Ambulatory Visit: Payer: Self-pay

## 2021-10-25 ENCOUNTER — Inpatient Hospital Stay (HOSPITAL_COMMUNITY)
Admission: AD | Admit: 2021-10-25 | Discharge: 2021-10-25 | Disposition: A | Discharge status: Home / Self Care | Payer: Medicaid Other | Attending: Obstetrics & Gynecology | Admitting: Obstetrics & Gynecology

## 2021-10-25 DIAGNOSIS — Z8751 Personal history of pre-term labor: Secondary | ICD-10-CM

## 2021-10-25 DIAGNOSIS — Z3A25 25 weeks gestation of pregnancy: Secondary | ICD-10-CM | POA: Insufficient documentation

## 2021-10-25 DIAGNOSIS — O365920 Maternal care for other known or suspected poor fetal growth, second trimester, not applicable or unspecified: Secondary | ICD-10-CM | POA: Diagnosis not present

## 2021-10-25 MED ORDER — BETAMETHASONE SOD PHOS & ACET 6 (3-3) MG/ML IJ SUSP
12.0000 mg | Freq: Once | INTRAMUSCULAR | Status: AC
  Administered 2021-10-25: 12 mg via INTRAMUSCULAR

## 2021-10-25 MED ORDER — BETAMETHASONE SOD PHOS & ACET 6 (3-3) MG/ML IJ SUSP: 12.0000 mg | Freq: Once | INTRAMUSCULAR | Status: DC

## 2021-10-25 NOTE — MAU Note (Signed)
Here for 2nd dose of Betamethasone.  No complaints today.  No problems after first injection.

## 2021-10-25 NOTE — MAU Provider Note (Signed)
None     S [name] is a 30 y.o. [phone] patient who presents to MAU today for her second betamethasone dose.  She is receiving BMZ due to history of preterm deliveries & IUGR with current pregnancy. She denies abdominal pain or vaginal bleeding. Reports good fetal movement. Has appointment with [facility] MFM on Wednesday.   O BP 106/63 (BP Location: Right Arm)   Pulse 82   Temp 98.2 F (36.8 C) (Oral)   Resp 16   LMP 06/08/2021 (Approximate)   SpO2 99%  Physical Exam Vitals and nursing note reviewed.  Constitutional:      General: She is not in acute distress.    Appearance: Normal appearance. She is not ill-appearing.  HENT:     Head: Normocephalic and atraumatic.  Eyes:     General: No scleral icterus.    Pupils: Pupils are equal, round, and reactive to light.  Pulmonary:     Effort: Pulmonary effort is normal. No respiratory distress.  Neurological:     General: No focal deficit present.     Mental Status: She is alert.  Psychiatric:        Mood and Affect: Mood normal.        Behavior: Behavior normal.    A Medical screening exam complete 1. History of preterm delivery   2. [redacted] weeks gestation of pregnancy      P Discharge from MAU in stable condition Reviewed Preterm labor precautions & other reasons to return to MAU Keep scheduled follow up with OB & MFM  Judeth Horn, NP 10/25/2021 2:42 PM

## 2021-11-06 ENCOUNTER — Encounter (HOSPITAL_COMMUNITY): Payer: Self-pay | Admitting: Emergency Medicine

## 2021-11-06 ENCOUNTER — Ambulatory Visit (HOSPITAL_COMMUNITY)
Admission: EM | Admit: 2021-11-06 | Discharge: 2021-11-06 | Disposition: A | Discharge status: Home / Self Care | Payer: Medicaid Other | Attending: Internal Medicine | Admitting: Internal Medicine

## 2021-11-06 ENCOUNTER — Other Ambulatory Visit: Payer: Self-pay

## 2021-11-06 DIAGNOSIS — R6889 Other general symptoms and signs: Secondary | ICD-10-CM

## 2021-11-06 MED ORDER — OSELTAMIVIR PHOSPHATE 75 MG PO CAPS: 75.0000 mg | ORAL_CAPSULE | Freq: Two times a day (BID) | ORAL | 0 refills | Status: DC

## 2021-11-06 NOTE — ED Triage Notes (Signed)
Pt presents with a cough and headache x 1 week. States she has body aches x 1 day and states when she coughs she has a headache and chest pain.

## 2021-11-06 NOTE — Discharge Instructions (Addendum)
Increase oral fluid intake Take medications as prescribed Tylenol as needed for pain and/or fever. Make sure you call your obstetrician today to be evaluated today.

## 2021-11-08 NOTE — ED Provider Notes (Signed)
[facility]    CSN: [id number] Arrival date & time: 11/06/21  0813      History   Chief Complaint Chief Complaint  Patient presents with   Cough   Headache    HPI Maria Riley is a 30 y.o. female comes to the urgent care with 1 day history of generalized body aches, nonproductive cough and a headache.  Patient had some nasal congestion about a week ago.  Over the past day she started experiencing headache, chest pain as well as a nonproductive cough.  Patient started experiencing a fever over the past day as well.  No sick contacts.  Patient is not vaccinated against the flu.  No exposures to COVID-19 infection.  No nausea, vomiting or diarrhea.Marland Kitchen   HPI  Past Medical History:  Diagnosis Date   Medical history non-contributory     Patient Active Problem List   Diagnosis Date Noted   SAB (spontaneous abortion) 04/29/2021   Overdose 12/19/2020   MDD (major depressive disorder), recurrent episode, severe (HCC) 07/08/2018   DIC (disseminated intravascular coagulation) (HCC) 06/13/2018   Antepartum placental abruption 06/13/2018   PPH (postpartum hemorrhage) 06/13/2018   Language barrier 06/13/2018   Supervision of high risk pregnancy, antepartum 11/23/2016   History of preterm delivery 11/23/2016    Past Surgical History:  Procedure Laterality Date   CESAREAN SECTION N/A 06/13/2018   Procedure: CESAREAN SECTION;  Surgeon: Spangle Bing, MD;  Location: [facility];  Service: Obstetrics;  Laterality: N/A;   MOUTH SURGERY     WISDOM TOOTH EXTRACTION Bilateral     OB History     Gravida  6   Para  4   Term  0   Preterm  4   AB  1   Living  3      SAB  1   IAB  0   Ectopic  0   Multiple  0   Live Births  4            Home Medications    Prior to Admission medications   Medication Sig Start Date End Date Taking? Authorizing Provider  oseltamivir (TAMIFLU) 75 MG capsule Take 1 capsule (75 mg total) by mouth every 12 (twelve)  hours. 11/06/21  Yes Siana Panameno, Britta Mccreedy, MD  butalbital-acetaminophen-caffeine (FIORICET) [phone] MG tablet Take 2 tablets by mouth every 4 (four) hours as needed for headache. 07/13/21   Marylene Land, CNM  glycopyrrolate (ROBINUL) 1 MG tablet Take 1 tablet (1 mg total) by mouth 3 (three) times daily. 07/13/21   Marylene Land, CNM  ondansetron (ZOFRAN-ODT) 8 MG disintegrating tablet Take 1 tablet (8 mg total) by mouth every 8 (eight) hours as needed for nausea or vomiting. 07/13/21   Marylene Land, CNM  Prenatal Vit-Fe Fumarate-FA (PRENATAL COMPLETE) 14-0.4 MG TABS Take 1 tablet by mouth daily. 03/31/21   Ivette Loyal, NP  fluticasone (FLONASE) 50 MCG/ACT nasal spray Place 1 spray into both nostrils daily. 01/02/21 04/25/21  Burky, Barron Alvine, NP  LO LOESTRIN FE 1 MG-10 MCG / 10 MCG tablet Take 1 tablet by mouth daily. 03/06/20 07/06/20  Brock Bad, MD  medroxyPROGESTERone (DEPO-PROVERA) 150 MG/ML injection Inject 1 mL (150 mg total) into the muscle every 3 (three) months. Patient not taking: Reported on 11/14/2018 07/18/18 07/06/20  Brock Bad, MD    Family History Family History  Problem Relation Age of Onset   Healthy Father    Alcohol abuse Neg Hx  Arthritis Neg Hx    Asthma Neg Hx    Birth defects Neg Hx    Cancer Neg Hx    COPD Neg Hx    Depression Neg Hx    Diabetes Neg Hx    Drug abuse Neg Hx    Early death Neg Hx    Hearing loss Neg Hx    Heart disease Neg Hx    Hyperlipidemia Neg Hx    Hypertension Neg Hx    Kidney disease Neg Hx    Learning disabilities Neg Hx    Mental illness Neg Hx    Mental retardation Neg Hx    Miscarriages / Stillbirths Neg Hx    Stroke Neg Hx    Vision loss Neg Hx    Varicose Veins Neg Hx     Social History Social History   Tobacco Use   Smoking status: Never   Smokeless tobacco: Never  Vaping Use   Vaping Use: Never used  Substance Use Topics   Alcohol use: No   Drug use: No      Allergies   Patient has no known allergies.   Review of Systems Review of Systems As per HPI  Physical Exam Triage Vital Signs ED Triage Vitals  Enc Vitals Group     BP 11/06/21 0833 104/67     Pulse Rate 11/06/21 0833 (!) 126     Resp 11/06/21 0833 17     Temp 11/06/21 0833 99.5 F (37.5 C)     Temp Source 11/06/21 0833 Oral     SpO2 11/06/21 0833 97 %     Weight --      Height --      Head Circumference --      Peak Flow --      Pain Score 11/06/21 0831 10     Pain Loc --      Pain Edu? --      Excl. in GC? --    No data found.  Updated Vital Signs BP 104/67 (BP Location: Left Arm)   Pulse (!) 126   Temp 99.5 F (37.5 C) (Oral)   Resp 17   LMP 06/08/2021 (Approximate)   SpO2 97%   Visual Acuity Right Eye Distance:   Left Eye Distance:   Bilateral Distance:    Right Eye Near:   Left Eye Near:    Bilateral Near:     Physical Exam Vitals and nursing note reviewed.  HENT:     Mouth/Throat:     Mouth: Mucous membranes are moist.     Comments: No pharyngeal erythema. Cardiovascular:     Rate and Rhythm: Normal rate and regular rhythm.     Heart sounds: Normal heart sounds.  Pulmonary:     Effort: Pulmonary effort is normal.     Breath sounds: Normal breath sounds.  Abdominal:     General: Bowel sounds are normal.     Palpations: Abdomen is soft.  Musculoskeletal:        General: Normal range of motion.     Cervical back: Normal range of motion.     UC Treatments / Results  Labs (all labs ordered are listed, but only abnormal results are displayed) Labs Reviewed - No data to display  EKG   Radiology No results found.  Procedures Procedures (including critical care time)  Medications Ordered in UC Medications - No data to display  Initial Impression / Assessment and Plan / UC Course  I have reviewed the triage  vital signs and the nursing notes.  Pertinent labs & imaging results that were available during my care of the patient  were reviewed by me and considered in my medical decision making (see chart for details).     1.  Flulike illness: I will treat clinically for influenza given acute worsening of symptoms over the past day. Tamiflu 75 mg twice daily for 5 days Tylenol/Motrin as needed for fever and/or body aches Return to urgent care if symptoms worsen Maintain adequate hydration. Final Clinical Impressions(s) / UC Diagnoses   Final diagnoses:  Flu-like symptoms     Discharge Instructions      Increase oral fluid intake Take medications as prescribed Tylenol as needed for pain and/or fever. Make sure you call your obstetrician today to be evaluated today.   ED Prescriptions     Medication Sig Dispense Auth. Provider   oseltamivir (TAMIFLU) 75 MG capsule Take 1 capsule (75 mg total) by mouth every 12 (twelve) hours. 10 capsule Addisen Chappelle, Myrene Galas, MD      PDMP not reviewed this encounter.   Chase Picket, MD 11/08/21 [phone]

## 2021-11-11 DIAGNOSIS — Z3490 Encounter for supervision of normal pregnancy, unspecified, unspecified trimester: Secondary | ICD-10-CM | POA: Diagnosis not present

## 2022-01-19 DIAGNOSIS — D649 Anemia, unspecified: Secondary | ICD-10-CM | POA: Diagnosis not present

## 2022-01-22 ENCOUNTER — Inpatient Hospital Stay (HOSPITAL_COMMUNITY)
Admission: EM | Admit: 2022-01-22 | Discharge: 2022-01-27 | DRG: 862 | Disposition: A | Discharge status: Home / Self Care | Payer: Medicaid Other | Attending: Internal Medicine | Admitting: Internal Medicine

## 2022-01-22 ENCOUNTER — Emergency Department (HOSPITAL_COMMUNITY): Payer: Medicaid Other

## 2022-01-22 DIAGNOSIS — Z90711 Acquired absence of uterus with remaining cervical stump: Secondary | ICD-10-CM

## 2022-01-22 DIAGNOSIS — B957 Other staphylococcus as the cause of diseases classified elsewhere: Secondary | ICD-10-CM | POA: Diagnosis not present

## 2022-01-22 DIAGNOSIS — Z20822 Contact with and (suspected) exposure to covid-19: Secondary | ICD-10-CM | POA: Diagnosis present

## 2022-01-22 DIAGNOSIS — Z932 Ileostomy status: Principal | ICD-10-CM

## 2022-01-22 DIAGNOSIS — I82411 Acute embolism and thrombosis of right femoral vein: Secondary | ICD-10-CM | POA: Diagnosis present

## 2022-01-22 DIAGNOSIS — K5730 Diverticulosis of large intestine without perforation or abscess without bleeding: Secondary | ICD-10-CM | POA: Diagnosis not present

## 2022-01-22 DIAGNOSIS — D6959 Other secondary thrombocytopenia: Secondary | ICD-10-CM | POA: Diagnosis not present

## 2022-01-22 DIAGNOSIS — R1013 Epigastric pain: Secondary | ICD-10-CM | POA: Diagnosis present

## 2022-01-22 DIAGNOSIS — Z79899 Other long term (current) drug therapy: Secondary | ICD-10-CM | POA: Diagnosis not present

## 2022-01-22 DIAGNOSIS — E872 Acidosis, unspecified: Secondary | ICD-10-CM | POA: Diagnosis not present

## 2022-01-22 DIAGNOSIS — R112 Nausea with vomiting, unspecified: Secondary | ICD-10-CM | POA: Diagnosis present

## 2022-01-22 DIAGNOSIS — N739 Female pelvic inflammatory disease, unspecified: Secondary | ICD-10-CM | POA: Diagnosis not present

## 2022-01-22 DIAGNOSIS — Z9049 Acquired absence of other specified parts of digestive tract: Secondary | ICD-10-CM | POA: Diagnosis not present

## 2022-01-22 DIAGNOSIS — I2699 Other pulmonary embolism without acute cor pulmonale: Secondary | ICD-10-CM | POA: Diagnosis not present

## 2022-01-22 DIAGNOSIS — N3289 Other specified disorders of bladder: Secondary | ICD-10-CM | POA: Diagnosis not present

## 2022-01-22 DIAGNOSIS — Y838 Other surgical procedures as the cause of abnormal reaction of the patient, or of later complication, without mention of misadventure at the time of the procedure: Secondary | ICD-10-CM | POA: Diagnosis present

## 2022-01-22 DIAGNOSIS — T8142XA Infection following a procedure, deep incisional surgical site, initial encounter: Secondary | ICD-10-CM | POA: Diagnosis not present

## 2022-01-22 DIAGNOSIS — N390 Urinary tract infection, site not specified: Secondary | ICD-10-CM | POA: Diagnosis not present

## 2022-01-22 DIAGNOSIS — F332 Major depressive disorder, recurrent severe without psychotic features: Secondary | ICD-10-CM | POA: Diagnosis present

## 2022-01-22 DIAGNOSIS — N200 Calculus of kidney: Secondary | ICD-10-CM | POA: Diagnosis not present

## 2022-01-22 DIAGNOSIS — I82422 Acute embolism and thrombosis of left iliac vein: Secondary | ICD-10-CM | POA: Diagnosis not present

## 2022-01-22 DIAGNOSIS — K6389 Other specified diseases of intestine: Secondary | ICD-10-CM | POA: Diagnosis not present

## 2022-01-22 DIAGNOSIS — L02214 Cutaneous abscess of groin: Secondary | ICD-10-CM | POA: Diagnosis not present

## 2022-01-22 LAB — CBC WITH DIFFERENTIAL/PLATELET
Abs Immature Granulocytes: 0.02 10*3/uL (ref 0.00–0.07)
Basophils Absolute: 0 10*3/uL (ref 0.0–0.1)
Basophils Relative: 0 %
Eosinophils Absolute: 0.1 10*3/uL (ref 0.0–0.5)
Eosinophils Relative: 2 %
HCT: 41.2 % (ref 36.0–46.0)
Hemoglobin: 13.9 g/dL (ref 12.0–15.0)
Immature Granulocytes: 0 %
Lymphocytes Relative: 31 %
Lymphs Abs: 1.6 10*3/uL (ref 0.7–4.0)
MCH: 30 pg (ref 26.0–34.0)
MCHC: 33.7 g/dL (ref 30.0–36.0)
MCV: 89 fL (ref 80.0–100.0)
Monocytes Absolute: 0.4 10*3/uL (ref 0.1–1.0)
Monocytes Relative: 7 %
Neutro Abs: 3.1 10*3/uL (ref 1.7–7.7)
Neutrophils Relative %: 60 %
Platelets: 171 10*3/uL (ref 150–400)
RBC: 4.63 MIL/uL (ref 3.87–5.11)
RDW: 14.9 % (ref 11.5–15.5)
WBC: 5.3 10*3/uL (ref 4.0–10.5)
nRBC: 0 % (ref 0.0–0.2)

## 2022-01-22 LAB — COMPREHENSIVE METABOLIC PANEL
ALT: 17 U/L (ref 0–44)
AST: 19 U/L (ref 15–41)
Albumin: 3.9 g/dL (ref 3.5–5.0)
Alkaline Phosphatase: 158 U/L — ABNORMAL HIGH (ref 38–126)
Anion gap: 9 (ref 5–15)
BUN: 9 mg/dL (ref 6–20)
CO2: 24 mmol/L (ref 22–32)
Calcium: 9.4 mg/dL (ref 8.9–10.3)
Chloride: 102 mmol/L (ref 98–111)
Creatinine, Ser: 0.77 mg/dL (ref 0.44–1.00)
GFR, Estimated: >60 mL/min (ref >60)
Glucose, Bld: 96 mg/dL (ref 70–99)
Potassium: 4 mmol/L (ref 3.5–5.1)
Sodium: 135 mmol/L (ref 135–145)
Total Bilirubin: 0.4 mg/dL (ref 0.3–1.2)
Total Protein: 8.1 g/dL (ref 6.5–8.1)

## 2022-01-22 LAB — URINALYSIS, ROUTINE W REFLEX MICROSCOPIC
Bilirubin Urine: NEGATIVE
Glucose, UA: NEGATIVE mg/dL
Ketones, ur: NEGATIVE mg/dL
Nitrite: POSITIVE — AB
Protein, ur: 30 mg/dL — AB
Specific Gravity, Urine: 1.015 (ref 1.005–1.030)
pH: 6 (ref 5.0–8.0)

## 2022-01-22 LAB — I-STAT BETA HCG BLOOD, ED (MC, WL, AP ONLY): I-stat hCG, quantitative: <5 m[IU]/mL (ref <5)

## 2022-01-22 LAB — RESP PANEL BY RT-PCR (FLU A&B, COVID) ARPGX2
Influenza A by PCR: NEGATIVE
Influenza B by PCR: NEGATIVE
SARS Coronavirus 2 by RT PCR: NEGATIVE

## 2022-01-22 LAB — LIPASE, BLOOD: Lipase: 43 U/L (ref 11–51)

## 2022-01-22 MED ORDER — HEPARIN (PORCINE) 25000 UT/250ML-% IV SOLN
1100.0000 [IU]/h | INTRAVENOUS | Status: DC
  Administered 2022-01-22 – 2022-01-23 (×2): 1100 [IU]/h via INTRAVENOUS
  Filled 2022-01-22: qty 250

## 2022-01-22 MED ORDER — IOHEXOL 300 MG/ML  SOLN
100.0000 mL | Freq: Once | INTRAMUSCULAR | Status: AC | PRN
  Administered 2022-01-22: 100 mL via INTRAVENOUS

## 2022-01-22 MED ORDER — HEPARIN BOLUS VIA INFUSION
4000.0000 [IU] | Freq: Once | INTRAVENOUS | Status: AC
  Administered 2022-01-22: 4000 [IU] via INTRAVENOUS
  Filled 2022-01-22: qty 4000

## 2022-01-22 NOTE — ED Provider Notes (Signed)
[facility] EMERGENCY DEPARTMENT Provider Note   CSN: [id number] Arrival date & time: 01/22/22  1647     History Colectomy, partial hysterectomy, C section Chief Complaint  Patient presents with   Abdominal Pain    Maria Riley is a 31 y.o. female.  31 y.o female with no PMH presents to the ED via POV with a chief complaint of abdominal pain.  According to patient's family member at the bedside, patient had traveled to [place] in the month of January after being 8 months pregnant.  She awoke suddenly in [place], with severe abdominal pain.  Reports she was rushed to the hospital where she was found to have placenta abruptio, they had to perform a C-section.  Unfortunately, in the middle of this procedure they "lacerated a vessel ".  Patient had 4 total surgeries in [place].  She also had a partial hysterectomy where she was told only half of her uterus was removed but both ovaries were taken away.  She also had a colectomy.  She is returning today as she is having worsening pain to the lower abdomen.  She also reports some numbness to her left leg.  He has been relating very little since the surgeries.  Reports pain around the ostomy site after eating and defecation.  In addition, noting some burning with urination.  Does endorse some chills but no fevers.  The history is provided by the patient and medical records.  Abdominal Pain Pain location:  Generalized Pain quality: not bloating   Pain radiates to:  Does not radiate Pain severity:  Severe Onset quality:  Gradual Associated symptoms: chills   Associated symptoms: no chest pain, no fever, no nausea, no shortness of breath and no vomiting       Home Medications Prior to Admission medications   Medication Sig Start Date End Date Taking? Authorizing Provider  butalbital-acetaminophen-caffeine (FIORICET) 50-325-40 MG tablet Take 2 tablets by mouth every 4 (four) hours as needed for headache. 07/13/21   Marylene Land, CNM  glycopyrrolate (ROBINUL) 1 MG tablet Take 1 tablet (1 mg total) by mouth 3 (three) times daily. 07/13/21   Marylene Land, CNM  ondansetron (ZOFRAN-ODT) 8 MG disintegrating tablet Take 1 tablet (8 mg total) by mouth every 8 (eight) hours as needed for nausea or vomiting. 07/13/21   Marylene Land, CNM  oseltamivir (TAMIFLU) 75 MG capsule Take 1 capsule (75 mg total) by mouth every 12 (twelve) hours. 11/06/21   LampteyBritta Mccreedy, MD  Prenatal Vit-Fe Fumarate-FA (PRENATAL COMPLETE) 14-0.4 MG TABS Take 1 tablet by mouth daily. 03/31/21   Ivette Loyal, NP  fluticasone (FLONASE) 50 MCG/ACT nasal spray Place 1 spray into both nostrils daily. 01/02/21 04/25/21  Burky, Barron Alvine, NP  LO LOESTRIN FE 1 MG-10 MCG / 10 MCG tablet Take 1 tablet by mouth daily. 03/06/20 07/06/20  Brock Bad, MD  medroxyPROGESTERone (DEPO-PROVERA) 150 MG/ML injection Inject 1 mL (150 mg total) into the muscle every 3 (three) months. Patient not taking: Reported on 11/14/2018 07/18/18 07/06/20  Brock Bad, MD      Allergies    Patient has no known allergies.    Review of Systems   Review of Systems  Constitutional:  Positive for chills. Negative for fever.  Respiratory:  Negative for shortness of breath.   Cardiovascular:  Negative for chest pain.  Gastrointestinal:  Positive for abdominal pain. Negative for nausea and vomiting.  Musculoskeletal:  Negative for back pain.  All other systems reviewed and are negative.  Physical Exam Updated Vital Signs BP 107/74    Pulse 94    Temp 98.8 F (37.1 C) (Oral)    Resp 18    LMP 06/08/2021 (Approximate)    SpO2 100%  Physical Exam Vitals and nursing note reviewed.  Constitutional:      Appearance: She is ill-appearing.     Comments: Teary eyed during entire evaluation.   HENT:     Head: Normocephalic and atraumatic.  Cardiovascular:     Rate and Rhythm: Normal rate.     Pulses:          Popliteal pulses are 2+ on the  right side and 2+ on the left side.       Dorsalis pedis pulses are 2+ on the right side and 2+ on the left side.     Comments: Pain with palpation of the left calf, no pitting edema noted.  Pulmonary:     Effort: Pulmonary effort is normal.     Breath sounds: No wheezing.  Abdominal:     General: Abdomen is flat. A surgical scar is present. Bowel sounds are absent.     Tenderness: There is abdominal tenderness. There is guarding. There is no right CVA tenderness or left CVA tenderness.  Skin:    General: Skin is warm and dry.  Neurological:     Mental Status: She is alert.    ED Results / Procedures / Treatments   Labs (all labs ordered are listed, but only abnormal results are displayed) Labs Reviewed  COMPREHENSIVE METABOLIC PANEL - Abnormal; Notable for the following components:      Result Value   Alkaline Phosphatase 158 (*)    All other components within normal limits  URINALYSIS, ROUTINE W REFLEX MICROSCOPIC - Abnormal; Notable for the following components:   Color, Urine AMBER (*)    APPearance HAZY (*)    Hgb urine dipstick MODERATE (*)    Protein, ur 30 (*)    Nitrite POSITIVE (*)    Leukocytes,Ua MODERATE (*)    Bacteria, UA RARE (*)    All other components within normal limits  RESP PANEL BY RT-PCR (FLU A&B, COVID) ARPGX2  LIPASE, BLOOD  CBC WITH DIFFERENTIAL/PLATELET  HEPARIN LEVEL (UNFRACTIONATED)  CBC  I-STAT BETA HCG BLOOD, ED ([facility], [facility], [facility] ONLY)    EKG None  Radiology CT ABDOMEN PELVIS W CONTRAST  Result Date: 01/22/2022 CLINICAL DATA:  Abdominal pain following surgery in [place]. EXAM: CT ABDOMEN AND PELVIS WITH CONTRAST TECHNIQUE: Multidetector CT imaging of the abdomen and pelvis was performed using the standard protocol following bolus administration of intravenous contrast. RADIATION DOSE REDUCTION: This exam was performed according to the departmental dose-optimization program which includes automated exposure control, adjustment of the mA and/or kV  according to patient size and/or use of iterative reconstruction technique. CONTRAST:  OMNIPAQUE IOHEXOL 300 MG/ML  SOLN COMPARISON:  None. FINDINGS: Lower chest: No acute abnormality. Hepatobiliary: Hypodense region is seen in the left lobe of the liver adjacent to the falciform ligament, likely representing focal fatty infiltration. There is an ill-defined hypodense region in the anterior right lobe of the liver at the gallbladder fossa which can not be further characterized on this single-phase exam. The gallbladder is without stones. No biliary ductal dilatation. Pancreas: Unremarkable. No pancreatic ductal dilatation or surrounding inflammatory changes. Spleen: Normal in size without focal abnormality. Adrenals/Urinary Tract: The adrenal glands are within normal limits. A nonobstructive calculus is noted in the  lower pole the left kidney. No obstructive uropathy bilaterally. There is bladder wall thickening with surrounding fat stranding which may be related to local inflammatory changes. Stomach/Bowel: The stomach is within normal limits. No bowel obstruction, free air, or pneumatosis. The patient is status post presumed right hemicolectomy with a right lower quadrant ostomy. A few scattered diverticula are noted in the remaining colon. There is mild small bowel wall thickening in the right lower quadrant which is likely related to local inflammatory changes. Vascular/Lymphatic: A filling defect is present in the common and external iliac veins on the left and common femoral vein on the right., compatible with deep venous thrombosis. The aorta is normal in caliber. No definite abdominal or pelvic lymphadenopathy by size criteria. Reproductive: Not well seen due to inflammatory changes and lack of enteric contrast. Per PA Delsa SaleJanice Preslyn Warr, status post hysterectomy. Other: A rim enhancing collection is noted in the pelvis in the left lower quadrant measuring 7.1 x 6.0 x 4.9 cm, concerning for abscess. There is  a linear rim enhancing hypodense collection in the pelvis in the region of prior surgery best seen on coronal image 47. Fat stranding and a small amount of free fluid are noted in the pelvis. Musculoskeletal: No acute osseous abnormality. IMPRESSION: 1. Findings compatible with deep venous thrombosis involving the common and external iliac veins on the left and common femoral vein on the right. 2. Status post surgery, possible hysterectomy [facility]. There is a rim enhancing collection containing air in the pelvis on the left measuring 7.1 x 6.0 x 4.9 cm, likely abscess. There is a linear hypodense collection with rim enhancement and air in the lower abdomen measuring 7.6 x 0.4 x 0.8 cm, possible abscess. Surgical consultation is recommended. 3. Bladder wall thickening with surrounding fat stranding, possible infectious or inflammatory cystitis. 4. Small hypodense collection in the low anterior abdominal wall measuring 1.2 x 1.1 x 1.0 cm in the region of prior surgery, possible phlegmon versus small abscess. 5. Status post right hemicolectomy with right lower quadrant ostomy. No bowel obstruction or free air. There is mild thickening of a few loops of small bowel in the pelvis on the right which is likely related to local inflammatory changes. 6. Left renal calculus. 7. Remaining findings as described above. Critical findings were reported to PA Danaisha Celli at 9:39 p.m. Electronically Signed   By: Thornell SartoriusLaura  Taylor M.D.   On: 01/22/2022 21:54    Procedures Procedures    Medications Ordered in ED Medications  heparin bolus via infusion 4,000 Units (has no administration in time range)  heparin ADULT infusion 100 units/mL (25000 units/27350mL) (has no administration in time range)  iohexol (OMNIPAQUE) 300 MG/ML solution 100 mL (100 mLs Intravenous Contrast Given 01/22/22 2111)    ED Course/ Medical Decision Making/ A&P Clinical Course as of 01/22/22 2339  Thu Jan 22, 2022  2242 Nitrite(!): POSITIVE [JS]   2242 Glori LuisLeukocytes,Ua(!): MODERATE [JS]    Clinical Course User Index [JS] Claude MangesSoto, Azaryah Oleksy, PA-C                           Medical Decision Making Amount and/or Complexity of Data Reviewed Labs: ordered. Decision-making details documented in ED Course.  Risk Prescription drug management. Decision regarding hospitalization.   This patient presents to the ED for concern of lower abdominal pain This involves a number of treatment options, and is a complaint that carries with it a high risk of complications and  morbidity.  The differential diagnosis includes obstruction, like abscess, post op complication.   Co morbidities: Discussed in HPI   Brief History:  Patient with recent operation in the month of January in [place].  Noted for partial hysterectomy and colectomy here with worsening abdominal pain.  Has also had some chills, some burning with urination.  Evaluated by Nigel BridgemanVicki Latham Monday who placed her on medication although this is unknown and not on the records.  Patient was previously 8 months pregnant last month, when she had an emergency C-section after delivery of stillbirth.  EMR reviewed including pt PMHx, past surgical history and past visits to ER.   See HPI for more details   Lab Tests:  I ordered and independently interpreted labs.  The pertinent results include:    I personally reviewed all laboratory work and imaging. Metabolic panel without any acute abnormality specifically kidney function within normal limits and no significant electrolyte abnormalities. CBC without leukocytosis or significant anemia.   Imaging Studies:  CT abdomen pelvis showed:   1. Findings compatible with deep venous thrombosis involving the  common and external iliac veins on the left and common femoral vein  on the right.  2. Status post surgery, possible hysterectomy [facility].  There is a rim enhancing collection containing air in the pelvis on  the left measuring 7.1 x 6.0  x 4.9 cm, likely abscess. There is a  linear hypodense collection with rim enhancement and air in the  lower abdomen measuring 7.6 x 0.4 x 0.8 cm, possible abscess.  Surgical consultation is recommended.  3. Bladder wall thickening with surrounding fat stranding, possible  infectious or inflammatory cystitis.  4. Small hypodense collection in the low anterior abdominal wall  measuring 1.2 x 1.1 x 1.0 cm in the region of prior surgery,  possible phlegmon versus small abscess.  5. Status post right hemicolectomy with right lower quadrant ostomy.  No bowel obstruction or free air. There is mild thickening of a few  loops of small bowel in the pelvis on the right which is likely  related to local inflammatory changes.  6. Left renal calculus.  7. Remaining findings as described above.    Medicines ordered:  Medications were offered for patient at this time, she like to wait for general surgery consultation.  Consults:  I requested consultation with general surgery,  and discussed lab and imaging findings as well as pertinent plan - they recommend: admission via hospitalist. Will evaluate patient in the ED before anticoags.   Reevaluation:  After the interventions noted above I re-evaluated patient and found that they have :stayed the same   Social Determinants of Health:  Patient has 3 small children at home, is out of colectomy bags.  Problem List / ED Course:  Patient here with postop complication.  Worsening abdominal pain, endorsing some chills but hemodynamically stable.  Consulted with general surgery Dr. Sophronia SimasShelby Allen who recommended admission via hospitalist service.  He will evaluate patient while in the ED.   Dispostion:  After consideration of the diagnostic results and the patients response to treatment, I feel that the patent would benefit from mission to the hospital service to treat her infection.  In addition, obtain recommendations from general surgery Dr. Carin PrimroseShelley  Allen who also recommended IR placement of a drain for her pelvic abscess at this time.  She will still be evaluated in the ED by general surgery.  Heparin has been started for her left external and common iliac.  Spoke to Dr. Allena Katz, hospitalist service who will admit patient for further management.   Portions of this note were generated with Scientist, clinical (histocompatibility and immunogenetics). Dictation errors may occur despite best attempts at proofreading.   Final Clinical Impression(s) / ED Diagnoses Final diagnoses:  Acute deep vein thrombosis (DVT) of iliac vein of left lower extremity (HCC)  Pelvis, female abscess    Rx / DC Orders ED Discharge Orders     None         Claude Manges, Cordelia Poche 01/22/22 2339    Melene Plan, DO 01/23/22 1457

## 2022-01-22 NOTE — H&P (Signed)
Initial vitals showed History and Physical    Maria Riley ERD:[id number] DOB: 02/16/1991 DOA: 01/22/2022  PCP: Patient, No Pcp Per (Inactive)  Patient coming from: Home  I have personally briefly reviewed patient's old medical records in [place]  Chief Complaint: Abdominal pain  HPI: Maria Riley is a 31 y.o. female with medical history significant for reported emergency cesarean section in [place] with complications resulting in right hemicolectomy with RLQ ostomy creation and presumed hysterectomy who presented to the ED for evaluation of abdominal pain.  History supplemented by friend at bedside.  Patient was in [place] in January where she was hospitalized for 2 weeks.  Initially underwent emergency C-section which was complicated by apparent ischemic necrosis of the bowel requiring for total surgeries.  Per their report she had a partial hysterectomy with oophorectomy.  She had a right hemicolectomy with ostomy placement.  She has had persistent abdominal pain, more pronounced in the epigastric region.  She has had loose watery output from her ostomy.  She has had nausea and vomiting with poor oral intake.  She has noted some cramping in both of her calves with worse lower extremity pain with ambulation.  She also has had dysuria with urinary frequency.  ED Course   Labs/Imaging on admission: I have personally reviewed following labs and imaging studies.  Initial vitals showed BP 100/73, pulse 106, RR 18, temp 98.8 F, SPO2 97% on room air.  Labs show WBC 5.3, hemoglobin 13.9, platelets 171,000, sodium 135, potassium 4.0, bicarb 24, BUN 9, creatinine 0.77, serum glucose 96, AST 19, ALT 17, alk phos 158, total bilirubin 0.4, lipase 43.  Urinalysis shows positive nitrates, moderate leukocytes, 11-20 RBC/hpf, 21-50 WBC/hpf, rare bacteria microscopy.  I-STAT beta-hCG <5.0.  SARS-CoV-2 and influenza PCR negative.  CT abdomen/pelvis with contrast showed DVT involving the common  and external iliac veins on the left and common femoral vein on the right.  Postsurgical changes, possible hysterectomy seen with a rim-enhancing collection at the left pelvis and another at the lower abdomen concerning for abscesses.  Bladder wall thickening, small hypodense collection in low anterior abdominal wall noted.  S/p right hemicolectomy with right lower quadrant ostomy changes noted without obstruction or free air.  Nonobstructive left renal calculus noted.  EDP discussed with on-call general surgery who recommended IR consult for percutaneous drain of pelvic abscess, okay to start heparin anticoagulation for DVT.  The hospitalist service was consulted to admit for further evaluation and management.  Review of Systems: All systems reviewed and are negative except as documented in history of present illness above.   Past Medical History:  Diagnosis Date   Medical history non-contributory     Past Surgical History:  Procedure Laterality Date   CESAREAN SECTION N/A 06/13/2018   Procedure: CESAREAN SECTION;  Surgeon: Maria Halim, MD;  Location: [facility];  Service: Obstetrics;  Laterality: N/A;   MOUTH SURGERY     WISDOM TOOTH EXTRACTION Bilateral     Social History:  reports that she has never smoked. She has never used smokeless tobacco. She reports that she does not drink alcohol and does not use drugs.  Allergies  Allergen Reactions   Tape     Adhesive tape- rash    Family History  Problem Relation Age of Onset   Healthy Father    Alcohol abuse Neg Hx    Arthritis Neg Hx    Asthma Neg Hx    Birth defects Neg Hx    Cancer  Neg Hx    COPD Neg Hx    Depression Neg Hx    Diabetes Neg Hx    Drug abuse Neg Hx    Early death Neg Hx    Hearing loss Neg Hx    Heart disease Neg Hx    Hyperlipidemia Neg Hx    Hypertension Neg Hx    Kidney disease Neg Hx    Learning disabilities Neg Hx    Mental illness Neg Hx    Mental retardation Neg Hx    Miscarriages /  Stillbirths Neg Hx    Stroke Neg Hx    Vision loss Neg Hx    Varicose Veins Neg Hx      Prior to Admission medications   Medication Sig Start Date End Date Taking? Authorizing Provider  butalbital-acetaminophen-caffeine (FIORICET) 50-325-40 MG tablet Take 2 tablets by mouth every 4 (four) hours as needed for headache. 07/13/21   Maria Riley, CNM  glycopyrrolate (ROBINUL) 1 MG tablet Take 1 tablet (1 mg total) by mouth 3 (three) times daily. 07/13/21   Maria Riley, CNM  ondansetron (ZOFRAN-ODT) 8 MG disintegrating tablet Take 1 tablet (8 mg total) by mouth every 8 (eight) hours as needed for nausea or vomiting. 07/13/21   Maria Riley, CNM  oseltamivir (TAMIFLU) 75 MG capsule Take 1 capsule (75 mg total) by mouth every 12 (twelve) hours. 11/06/21   Maria Galas, MD  Prenatal Vit-Fe Fumarate-FA (PRENATAL COMPLETE) 14-0.4 MG TABS Take 1 tablet by mouth daily. 03/31/21   Maria Forster, NP  fluticasone (FLONASE) 50 MCG/ACT nasal spray Place 1 spray into both nostrils daily. 01/02/21 04/25/21  Burky, Malachy Moan, NP  LO LOESTRIN FE 1 MG-10 MCG / 10 MCG tablet Take 1 tablet by mouth daily. 03/06/20 07/06/20  Maria Bombard, MD  medroxyPROGESTERone (DEPO-PROVERA) 150 MG/ML injection Inject 1 mL (150 mg total) into the muscle every 3 (three) months. Patient not taking: Reported on 11/14/2018 07/18/18 07/06/20  Maria Bombard, MD    Physical Exam: Vitals:   01/22/22 2140 01/22/22 2230 01/22/22 2345 01/23/22 0015  BP: 103/77 107/74 98/70 102/71  Pulse: 100 94 86 94  Resp: _0 (!) 21  Temp:      TempSrc:      SpO2: 100% 100% 100% 100%  Weight:    66.2 kg  Height:    _1  (1.499 m)   Constitutional: Resting supine in bed, appears fatigued Eyes: PERRL, lids and conjunctivae normal ENMT: Mucous membranes are moist. Posterior pharynx clear of any exudate or lesions.Normal dentition.  Neck: normal, supple, no masses. Respiratory: clear to auscultation  bilaterally, no wheezing, no crackles.  Cardiovascular: Regular rate and rhythm, no murmurs / rubs / gallops. No extremity edema. 2+ pedal pulses. Abdomen: RLQ ostomy in place with brown liquid stool, generalized tenderness more pronounced in the epigastric region, bowel sounds positive Musculoskeletal: no clubbing / cyanosis. No joint deformity upper and lower extremities. Good ROM, no contractures. Normal muscle tone.  Skin: no rashes, lesions, ulcers. No induration Neurologic: CN 2-12 grossly intact. Sensation intact. Strength 5/5 in all 4.  Psychiatric: Alert and oriented x 3. Normal mood.   EKG: Not performed.  Assessment/Plan Principal Problem:   Pelvic abscess in female Active Problems:   Acute deep vein thrombosis (DVT) of iliac vein of left lower extremity (HCC)   Acute deep vein thrombosis (DVT) of femoral vein of right lower extremity (HCC)   UTI (urinary tract infection)   Vaughan Basta  D Olivarez is a 31 y.o. female with medical history significant for reported emergency cesarean section in [place] with complications resulting in right hemicolectomy with RLQ ostomy creation and presumed hysterectomy who is admitted with acute DVTs involving the left common and external iliac veins and right common femoral vein as well as pelvic abscess.  Assessment and Plan: * Pelvic abscess in female- (present on admission) S/p right hemicolectomy with ostomy creation in [place] January 2023 Recent hospitalization in [place] after emergency C-section with complications resulting in right hemicolectomy with ostomy creation.  Presenting with persistent abdominal pain.  CT A/P shows pelvic abscess at the left and lower abdomen. -General surgery following, IR consultation for percutaneous drain recommended -Obtain blood cultures -Start on IV Zosyn  Acute deep vein thrombosis (DVT) of iliac vein of left lower extremity ([place])- (present on admission) DVT involving the common and external iliac veins on the  left and common femoral vein on the right seen on CT A/P.  Likely provoked in setting of recent hospitalization in [place]. -Started on IV heparin drip, okay per general surgery -Consult to IR for further recommendations  UTI (urinary tract infection)- (present on admission) Patient reports dysuria with urinary frequency.  Bladder wall thickening noted on CT imaging. -Obtain urine culture -On Zosyn as above  DVT prophylaxis: Heparin gtt Code Status: Full code Family Communication: Close friend at bedside Disposition Plan: From home, dispo pending clinical progress Consults called: General surgery, IR consult order placed Severity of Illness: The appropriate patient status for this patient is INPATIENT. Inpatient status is judged to be reasonable and necessary in order to provide the required intensity of service to ensure the patient's safety. The patient's presenting symptoms, physical exam findings, and initial radiographic and laboratory data in the context of their chronic comorbidities is felt to place them at high risk for further clinical deterioration. Furthermore, it is not anticipated that the patient will be medically stable for discharge from the hospital within 2 midnights of admission.   * I certify that at the point of admission it is my clinical judgment that the patient will require inpatient hospital care spanning beyond 2 midnights from the point of admission due to high intensity of service, high risk for further deterioration and high frequency of surveillance required.Zada Finders MD [facility]  If 7PM-7AM, please contact night-coverage [email or web address]  01/23/2022, 1:14 AM

## 2022-01-22 NOTE — ED Provider Triage Note (Signed)
Emergency Medicine Provider Triage Evaluation Note  Maria Riley , a 31 y.o. female  was evaluated in triage.  Pt complains of abdominal pain.  She states that same has been worsening since she had surgery in [place] in January.  She states that she traveled to [place] with her family [redacted] weeks pregnant and awoke suddenly with terrible abdominal pain.  Went to hospital there who did an emergency C-section and found that the patient had detached placenta and was stillborn.  Patient subsequently had hemorrhage and surgical complications which resulted in a hysterectomy and colectomy.  She now has a colostomy bag in place.  States that she is completely unaware of what they did in the OR in [place], no one told her anything.  Information all gathered from paperwork that she has with her.  She states that the pain is diffuse throughout her abdomen and that her back is leaking and she is concerned for infection.   Review of Systems  Positive: Abdominal pain, nausea, vomiting Negative: Fever, chills  Physical Exam  LMP 06/08/2021 (Approximate)  Gen:   Awake, no distress   Resp:  Normal effort MSK:   Moves extremities without difficulty  Other:  Diffuse abdominal tenderness. Colostomy bag in place non-infectious appearing  Medical Decision Making  Medically screening exam initiated at 5:34 PM.  Appropriate orders placed.  Maria Riley was informed that the remainder of the evaluation will be completed by another provider, this initial triage assessment does not replace that evaluation, and the importance of remaining in the ED until their evaluation is complete.  Of note, as stated, it appears that patient had a hysterectomy in [place] according to the paperwork she has with her.  She specifically requests CT scan results, concerning 'if she still has all her organs' as hospital in [place] did not tell her what they did.  CT scan and abdominal labs ordered   Maria Riley 01/22/22 1739

## 2022-01-22 NOTE — ED Triage Notes (Signed)
Pt c/o abd pain since she came back from [place] in January. Pt was in [place], had CS w complications while there, abd pain & issues w colostomy bag since. Epigastric pain, worse w eating, tender to palpation.

## 2022-01-22 NOTE — Progress Notes (Signed)
ANTICOAGULATION CONSULT NOTE - Initial Consult  Pharmacy Consult for heparin Indication: DVT  No Known Allergies  Patient Measurements:   Heparin Dosing Weight: 66 kg  Vital Signs: Temp: 98.8 F (37.1 C) (02/16 1740) Temp Source: Oral (02/16 1740) BP: 103/77 (02/16 2140) Pulse Rate: 100 (02/16 2140)  Labs: Recent Labs    01/22/22 1739  HGB 13.9  HCT 41.2  PLT 171  CREATININE 0.77    CrCl cannot be calculated (Unknown ideal weight.).   Medical History: Past Medical History:  Diagnosis Date   Medical history non-contributory     Medications:  (Not in a hospital admission)   Assessment: 32 YOF with acute DVT involving the R iliac and common femoral veins to start IV Heparin. H/H and Plt wnl. SCr wnl   Goal of Therapy:  Heparin level 0.3-0.7 units/ml Monitor platelets by anticoagulation protocol: Yes   Plan:  -Heparin 4000 units IV bolus followed by heparin infusion at 1100 units/hr -F/u 6 hr HL -Monitor daily HL, CBC and s/s of bleeding  Albertina Parr, PharmD., BCCCP Clinical Pharmacist Please refer to [facility] for unit-specific pharmacist

## 2022-01-22 NOTE — Hospital Course (Signed)
Maria Riley is a 31 y.o. female with medical history significant for reported emergency cesarean section in [place] with complications resulting in right hemicolectomy with RLQ ostomy creation and presumed hysterectomy who is admitted with acute DVTs involving the left common and external iliac veins and right common femoral vein as well as pelvic abscess.

## 2022-01-22 NOTE — Consult Note (Signed)
Maria Riley Feb 08, 1991  [id number].    Requesting MD: Janeece Fitting, PA-C Chief Complaint/Reason for Consult: pelvic abscess  HPI:  Maria Riley is a 31 yo female who presented to the ED with abdominal pain. In January she underwent an emergency C section in [place]. We do not have records from her surgery but per report there were complications and she ultimately required 3 further surgeries including a partial colectomy, and now has an ostomy in place. Her last surgery was on January 14. She says that since then she has been having abdominal pain, which has been getting worse. She denies fevers, nausea and vomiting. Her ostomy has been functioning well with no issues. In the ED she has been afebrile and hemodynamically stable. Labs are unremarkable, WBC is normal. A CT scan showed a large rim-enhancing collection in the pelvis, as well as a left iliofemoral DVT and right common femoral DVT. General surgery was consulted.  ROS: Review of Systems  Constitutional:  Negative for chills and fever.  HENT:  Negative for sore throat.   Eyes:  Negative for redness.  Respiratory:  Negative for shortness of breath, wheezing and stridor.   Cardiovascular:  Negative for chest pain.  Gastrointestinal:  Positive for abdominal pain. Negative for nausea and vomiting.  Genitourinary:  Negative for urgency.  Neurological:  Negative for seizures and weakness.   Family History  Problem Relation Age of Onset   Healthy Father    Alcohol abuse Neg Hx    Arthritis Neg Hx    Asthma Neg Hx    Birth defects Neg Hx    Cancer Neg Hx    COPD Neg Hx    Depression Neg Hx    Diabetes Neg Hx    Drug abuse Neg Hx    Early death Neg Hx    Hearing loss Neg Hx    Heart disease Neg Hx    Hyperlipidemia Neg Hx    Hypertension Neg Hx    Kidney disease Neg Hx    Learning disabilities Neg Hx    Mental illness Neg Hx    Mental retardation Neg Hx    Miscarriages / Stillbirths Neg Hx    Stroke Neg Hx    Vision loss  Neg Hx    Varicose Veins Neg Hx     Past Medical History:  Diagnosis Date   Medical history non-contributory     Past Surgical History:  Procedure Laterality Date   CESAREAN SECTION N/A 06/13/2018   Procedure: CESAREAN SECTION;  Surgeon: Aletha Halim, MD;  Location: [place];  Service: Obstetrics;  Laterality: N/A;   MOUTH SURGERY     WISDOM TOOTH EXTRACTION Bilateral     Social History:  reports that she has never smoked. She has never used smokeless tobacco. She reports that she does not drink alcohol and does not use drugs.  Allergies: No Known Allergies  (Not in a hospital admission)    Physical Exam: Blood pressure 103/77, pulse 100, temperature 98.8 F (37.1 C), temperature source Oral, resp. rate 17, last menstrual period 06/08/2021, SpO2 100 %, unknown if currently breastfeeding. General: resting comfortably, appears stated age, no apparent distress Neurological: alert and oriented, no focal deficits, cranial nerves grossly in tact HEENT: normocephalic, atraumatic, oropharynx clear CV: regular rate and rhythm, extremities warm and well-perfused Respiratory: normal work of breathing on room air, symmetric chest wall expansion Abdomen: soft, nondistended, mildly tender to palpation. No masses or organomegaly. Ileostomy in place right abdomen, productive of  gas and stool. Midline and Pfannenstiel incisions are healing well with no erythema or induration. Extremities: warm and well-perfused, no deformities, moving all extremities spontaneously Psychiatric: normal mood and affect Skin: warm and dry, no jaundice, no rashes or lesions   Results for orders placed or performed during the hospital encounter of 01/22/22 (from the past 48 hour(s))  Urinalysis, Routine w reflex microscopic     Status: Abnormal   Collection Time: 01/22/22  4:47 PM  Result Value Ref Range   Color, Urine AMBER (A) YELLOW    Comment: BIOCHEMICALS MAY BE AFFECTED BY COLOR   APPearance  HAZY (A) CLEAR   Specific Gravity, Urine 1.015 1.005 - 1.030   pH 6.0 5.0 - 8.0   Glucose, UA NEGATIVE NEGATIVE mg/dL   Hgb urine dipstick MODERATE (A) NEGATIVE   Bilirubin Urine NEGATIVE NEGATIVE   Ketones, ur NEGATIVE NEGATIVE mg/dL   Protein, ur 30 (A) NEGATIVE mg/dL   Nitrite POSITIVE (A) NEGATIVE   Leukocytes,Ua MODERATE (A) NEGATIVE   RBC / HPF 11-20 0 - 5 RBC/hpf   WBC, UA 21-50 0 - 5 WBC/hpf   Bacteria, UA RARE (A) NONE SEEN   Squamous Epithelial / LPF 0-5 0 - 5   WBC Clumps PRESENT    Mucus PRESENT    Hyaline Casts, UA PRESENT     Comment: Performed at [facility], [address] [place], [place] [address]  Comprehensive metabolic panel     Status: Abnormal   Collection Time: 01/22/22  5:39 PM  Result Value Ref Range   Sodium 135 135 - 145 mmol/L   Potassium 4.0 3.5 - 5.1 mmol/L   Chloride 102 98 - 111 mmol/L   CO2 24 22 - 32 mmol/L   Glucose, Bld 96 70 - 99 mg/dL    Comment: Glucose reference range applies only to samples taken after fasting for at least 8 hours.   BUN 9 6 - 20 mg/dL   Creatinine, Ser 0.77 0.44 - 1.00 mg/dL   Calcium 9.4 8.9 - 10.3 mg/dL   Total Protein 8.1 6.5 - 8.1 g/dL   Albumin 3.9 3.5 - 5.0 g/dL   AST 19 15 - 41 U/L   ALT 17 0 - 44 U/L   Alkaline Phosphatase 158 (H) 38 - 126 U/L   Total Bilirubin 0.4 0.3 - 1.2 mg/dL   GFR, Estimated >60 >60 mL/min    Comment: (NOTE) Calculated using the CKD-EPI Creatinine Equation (2021)    Anion gap 9 5 - 15    Comment: Performed at [facility] [address] [place], [place] [address]  Lipase, blood     Status: None   Collection Time: 01/22/22  5:39 PM  Result Value Ref Range   Lipase 43 11 - 51 U/L    Comment: Performed at [facility] [address], [place], [place] [address]  CBC with Diff     Status: None   Collection Time: 01/22/22  5:39 PM  Result Value Ref Range   WBC 5.3 4.0 - 10.5 K/uL   RBC 4.63 3.87 - 5.11 MIL/uL   Hemoglobin 13.9 12.0 - 15.0 g/dL   HCT 41.2  36.0 - 46.0 %   MCV 89.0 80.0 - 100.0 fL   MCH 30.0 26.0 - 34.0 pg   MCHC 33.7 30.0 - 36.0 g/dL   RDW 14.9 11.5 - 15.5 %   Platelets 171 150 - 400 K/uL   nRBC 0.0 0.0 - 0.2 %  Neutrophils Relative % 60 %   Neutro Abs 3.1 1.7 - 7.7 K/uL   Lymphocytes Relative 31 %   Lymphs Abs 1.6 0.7 - 4.0 K/uL   Monocytes Relative 7 %   Monocytes Absolute 0.4 0.1 - 1.0 K/uL   Eosinophils Relative 2 %   Eosinophils Absolute 0.1 0.0 - 0.5 K/uL   Basophils Relative 0 %   Basophils Absolute 0.0 0.0 - 0.1 K/uL   Immature Granulocytes 0 %   Abs Immature Granulocytes 0.02 0.00 - 0.07 K/uL    Comment: Performed at [facility] [address] [place], [place] [address]  I-Stat beta hCG blood, ED     Status: None   Collection Time: 01/22/22  5:50 PM  Result Value Ref Range   I-stat hCG, quantitative <5.0 <5 mIU/mL   Comment 3            Comment:   GEST. AGE      CONC.  (mIU/mL)   <=1 WEEK        5 - 50     2 WEEKS       50 - 500     3 WEEKS       100 - 10,000     4 WEEKS     1,000 - 30,000        FEMALE AND NON-PREGNANT FEMALE:     LESS THAN 5 mIU/mL    CT ABDOMEN PELVIS W CONTRAST  Result Date: 01/22/2022 CLINICAL DATA:  Abdominal pain following surgery in [place]. EXAM: CT ABDOMEN AND PELVIS WITH CONTRAST TECHNIQUE: Multidetector CT imaging of the abdomen and pelvis was performed using the standard protocol following bolus administration of intravenous contrast. RADIATION DOSE REDUCTION: This exam was performed according to the departmental dose-optimization program which includes automated exposure control, adjustment of the mA and/or kV according to patient size and/or use of iterative reconstruction technique. CONTRAST:  150mL OMNIPAQUE IOHEXOL 300 MG/ML  SOLN COMPARISON:  None. FINDINGS: Lower chest: No acute abnormality. Hepatobiliary: Hypodense region is seen in the left lobe of the liver adjacent to the falciform ligament, likely representing focal fatty infiltration. There is an  ill-defined hypodense region in the anterior right lobe of the liver at the gallbladder fossa which can not be further characterized on this single-phase exam. The gallbladder is without stones. No biliary ductal dilatation. Pancreas: Unremarkable. No pancreatic ductal dilatation or surrounding inflammatory changes. Spleen: Normal in size without focal abnormality. Adrenals/Urinary Tract: The adrenal glands are within normal limits. A nonobstructive calculus is noted in the lower pole the left kidney. No obstructive uropathy bilaterally. There is bladder wall thickening with surrounding fat stranding which may be related to local inflammatory changes. Stomach/Bowel: The stomach is within normal limits. No bowel obstruction, free air, or pneumatosis. The patient is status post presumed right hemicolectomy with a right lower quadrant ostomy. A few scattered diverticula are noted in the remaining colon. There is mild small bowel wall thickening in the right lower quadrant which is likely related to local inflammatory changes. Vascular/Lymphatic: A filling defect is present in the common and external iliac veins on the left and common femoral vein on the right., compatible with deep venous thrombosis. The aorta is normal in caliber. No definite abdominal or pelvic lymphadenopathy by size criteria. Reproductive: Not well seen due to inflammatory changes and lack of enteric contrast. Per PA Hanley Seamen, status post hysterectomy. Other: A rim enhancing collection is noted in the pelvis in the left lower quadrant  measuring 7.1 x 6.0 x 4.9 cm, concerning for abscess. There is a linear rim enhancing hypodense collection in the pelvis in the region of prior surgery best seen on coronal image 47. Fat stranding and a small amount of free fluid are noted in the pelvis. Musculoskeletal: No acute osseous abnormality. IMPRESSION: 1. Findings compatible with deep venous thrombosis involving the common and external iliac veins on the  left and common femoral vein on the right. 2. Status post surgery, possible hysterectomy per PA [facility]. There is a rim enhancing collection containing air in the pelvis on the left measuring 7.1 x 6.0 x 4.9 cm, likely abscess. There is a linear hypodense collection with rim enhancement and air in the lower abdomen measuring 7.6 x 0.4 x 0.8 cm, possible abscess. Surgical consultation is recommended. 3. Bladder wall thickening with surrounding fat stranding, possible infectious or inflammatory cystitis. 4. Small hypodense collection in the low anterior abdominal wall measuring 1.2 x 1.1 x 1.0 cm in the region of prior surgery, possible phlegmon versus small abscess. 5. Status post right hemicolectomy with right lower quadrant ostomy. No bowel obstruction or free air. There is mild thickening of a few loops of small bowel in the pelvis on the right which is likely related to local inflammatory changes. 6. Left renal calculus. 7. Remaining findings as described above. Critical findings were reported to PA Soto at 9:39 p.m. Electronically Signed   By: Brett Fairy M.D.   On: 01/22/2022 21:54      Assessment/Plan This is a 31 yo female presenting 1 month after multiple recent abdominal surgeries. It is unclear what the exact sequence of events and operations was but this began after an emergency C section, and she presumably had postoperative issues that ultimately led to a right hemicolectomy and end ileostomy. I reviewed her CT scan, which shows a rim-enhancing collection in the pelvis adjacent to the bladder, consistent with an abscess. The small bowel is decompressed and there is no free air or pneumatosis. - Recommend IR consult in am to evaluate for percutaneous drainage of abscess - No indication for acute surgical intervention. Ok to begin heparin gtt for treatment of acute DVT. - Continue antibiotics - General surgery will follow  Level of decision-making: Moderate   Michaelle Birks, [facility], Hepatobiliary and Pancreatic Surgery 01/22/22 11:01 PM

## 2022-01-23 ENCOUNTER — Inpatient Hospital Stay (HOSPITAL_COMMUNITY): Payer: Medicaid Other

## 2022-01-23 DIAGNOSIS — N390 Urinary tract infection, site not specified: Secondary | ICD-10-CM | POA: Diagnosis present

## 2022-01-23 DIAGNOSIS — T8143XA Infection following a procedure, organ and space surgical site, initial encounter: Secondary | ICD-10-CM | POA: Diagnosis not present

## 2022-01-23 DIAGNOSIS — N739 Female pelvic inflammatory disease, unspecified: Secondary | ICD-10-CM | POA: Diagnosis not present

## 2022-01-23 DIAGNOSIS — I82411 Acute embolism and thrombosis of right femoral vein: Secondary | ICD-10-CM

## 2022-01-23 DIAGNOSIS — I2699 Other pulmonary embolism without acute cor pulmonale: Secondary | ICD-10-CM | POA: Diagnosis not present

## 2022-01-23 DIAGNOSIS — K651 Peritoneal abscess: Secondary | ICD-10-CM | POA: Diagnosis not present

## 2022-01-23 LAB — CBC
HCT: 38.1 % (ref 36.0–46.0)
Hemoglobin: 12.6 g/dL (ref 12.0–15.0)
MCH: 29.6 pg (ref 26.0–34.0)
MCHC: 33.1 g/dL (ref 30.0–36.0)
MCV: 89.4 fL (ref 80.0–100.0)
Platelets: 144 10*3/uL — ABNORMAL LOW (ref 150–400)
RBC: 4.26 MIL/uL (ref 3.87–5.11)
RDW: 15.1 % (ref 11.5–15.5)
WBC: 4.5 10*3/uL (ref 4.0–10.5)
nRBC: 0 % (ref 0.0–0.2)

## 2022-01-23 LAB — COMPREHENSIVE METABOLIC PANEL
ALT: 19 U/L (ref 0–44)
AST: 16 U/L (ref 15–41)
Albumin: 3.5 g/dL (ref 3.5–5.0)
Alkaline Phosphatase: 142 U/L — ABNORMAL HIGH (ref 38–126)
Anion gap: 9 (ref 5–15)
BUN: 8 mg/dL (ref 6–20)
CO2: 21 mmol/L — ABNORMAL LOW (ref 22–32)
Calcium: 9.1 mg/dL (ref 8.9–10.3)
Chloride: 107 mmol/L (ref 98–111)
Creatinine, Ser: 0.69 mg/dL (ref 0.44–1.00)
GFR, Estimated: >60 mL/min (ref >60)
Glucose, Bld: 93 mg/dL (ref 70–99)
Potassium: 3.7 mmol/L (ref 3.5–5.1)
Sodium: 137 mmol/L (ref 135–145)
Total Bilirubin: 0.5 mg/dL (ref 0.3–1.2)
Total Protein: 7.1 g/dL (ref 6.5–8.1)

## 2022-01-23 LAB — HEPARIN LEVEL (UNFRACTIONATED): Heparin Unfractionated: 0.66 IU/mL (ref 0.30–0.70)

## 2022-01-23 LAB — HIV ANTIBODY (ROUTINE TESTING W REFLEX): HIV Screen 4th Generation wRfx: NONREACTIVE

## 2022-01-23 MED ORDER — ONDANSETRON HCL 4 MG/2ML IJ SOLN: 4.0000 mg | Freq: Four times a day (QID) | INTRAMUSCULAR | Status: DC | PRN

## 2022-01-23 MED ORDER — ACETAMINOPHEN 650 MG RE SUPP: 650.0000 mg | Freq: Four times a day (QID) | RECTAL | Status: DC | PRN

## 2022-01-23 MED ORDER — IOHEXOL 350 MG/ML SOLN
100.0000 mL | Freq: Once | INTRAVENOUS | Status: AC | PRN
  Administered 2022-01-23: 75 mL via INTRAVENOUS

## 2022-01-23 MED ORDER — HEPARIN (PORCINE) 25000 UT/250ML-% IV SOLN
1000.0000 [IU]/h | INTRAVENOUS | Status: DC
  Administered 2022-01-23: 21:00:00 1100 [IU]/h via INTRAVENOUS
  Administered 2022-01-24 – 2022-01-25 (×2): 1000 [IU]/h via INTRAVENOUS
  Filled 2022-01-23 (×5): qty 250

## 2022-01-23 MED ORDER — MIDAZOLAM HCL 2 MG/2ML IJ SOLN
INTRAMUSCULAR | Status: AC
  Filled 2022-01-23: qty 2

## 2022-01-23 MED ORDER — HYDROCODONE-ACETAMINOPHEN 5-325 MG PO TABS
1.0000 | ORAL_TABLET | ORAL | Status: DC | PRN
  Administered 2022-01-23: 2 via ORAL
  Filled 2022-01-23: qty 2

## 2022-01-23 MED ORDER — FENTANYL CITRATE (PF) 100 MCG/2ML IJ SOLN
INTRAMUSCULAR | Status: AC
  Filled 2022-01-23: qty 2

## 2022-01-23 MED ORDER — LACTATED RINGERS IV SOLN: INTRAVENOUS | Status: AC

## 2022-01-23 MED ORDER — PIPERACILLIN-TAZOBACTAM 3.375 G IVPB 30 MIN
3.3750 g | Freq: Once | INTRAVENOUS | Status: AC
  Administered 2022-01-23: 3.375 g via INTRAVENOUS
  Filled 2022-01-23: qty 50

## 2022-01-23 MED ORDER — PIPERACILLIN-TAZOBACTAM 3.375 G IVPB
3.3750 g | Freq: Three times a day (TID) | INTRAVENOUS | Status: DC
Start: 2022-01-23 — End: 2022-01-26
  Administered 2022-01-23 – 2022-01-26 (×10): 3.375 g via INTRAVENOUS
  Filled 2022-01-23 (×9): qty 50

## 2022-01-23 MED ORDER — LIDOCAINE HCL 1 % IJ SOLN
INTRAMUSCULAR | Status: AC
  Filled 2022-01-23: qty 10

## 2022-01-23 MED ORDER — MORPHINE SULFATE (PF) 2 MG/ML IV SOLN: 1.0000 mg | INTRAVENOUS | Status: DC | PRN

## 2022-01-23 MED ORDER — LIDOCAINE HCL 1 % IJ SOLN
INTRAMUSCULAR | Status: AC
Start: 2022-01-23 — End: 2022-01-23
  Filled 2022-01-23: qty 10

## 2022-01-23 MED ORDER — ONDANSETRON HCL 4 MG PO TABS: 4.0000 mg | ORAL_TABLET | Freq: Four times a day (QID) | ORAL | Status: DC | PRN

## 2022-01-23 MED ORDER — ACETAMINOPHEN 325 MG PO TABS
650.0000 mg | ORAL_TABLET | Freq: Four times a day (QID) | ORAL | Status: DC | PRN
  Administered 2022-01-24: 650 mg via ORAL
  Filled 2022-01-23: qty 2

## 2022-01-23 MED ORDER — HEPARIN (PORCINE) 25000 UT/250ML-% IV SOLN: 1100.0000 [IU]/h | INTRAVENOUS | Status: AC

## 2022-01-23 MED ORDER — SODIUM CHLORIDE 0.9% FLUSH
5.0000 mL | Freq: Three times a day (TID) | INTRAVENOUS | Status: DC
  Administered 2022-01-23 – 2022-01-27 (×12): 5 mL

## 2022-01-23 NOTE — ED Notes (Signed)
Pt ambulated to restroom with slow steady gait. Stand by assist for safety. Pt endorsed pain with movement

## 2022-01-23 NOTE — Assessment & Plan Note (Signed)
S/p right hemicolectomy with ostomy creation in [place] January 2023 Recent hospitalization in [place] after emergency C-section with complications resulting in right hemicolectomy with ostomy creation.  Presenting with persistent abdominal pain.  CT A/P shows pelvic abscess at the left and lower abdomen. -General surgery following, IR consultation for percutaneous drain recommended -Obtain blood cultures -Start on IV Zosyn

## 2022-01-23 NOTE — Assessment & Plan Note (Signed)
Patient reports dysuria with urinary frequency.  Bladder wall thickening noted on CT imaging. -Obtain urine culture -On Zosyn as above

## 2022-01-23 NOTE — ED Notes (Addendum)
Spoke with IR nurses line. Notified Heparin has been stopped since 8:40am. They stated it would be at least 1-1.5 hrs before they can get her.  They are aiming to fit her in early this afternoon.

## 2022-01-23 NOTE — Procedures (Signed)
°  Procedure: CT L pelvic abscess drain placment scant old blood returned, ?infected hematoma EBL:   minimal Complications:  none immediate  See full dictation in [employer].  Thora Lance MD Main # [phone] Pager  [phone] Mobile [phone]

## 2022-01-23 NOTE — Progress Notes (Addendum)
PROGRESS NOTE        PATIENT DETAILS Name: Maria Riley Age: 31 y.o. Sex: female Date of Birth: [birth year] Admit Date: 01/22/2022 Admitting Physician Lenore Cordia, MD QP:[id number], No Pcp Per (Inactive)  Brief Summary: Maria Riley is a 31 y.o. female who recently had a emergent C-section in January 2023 in [place] resulting in postoperative complications that resulted in right hemicolectomy with ostomy, hysterectomy -presented with several weeks of abdominal pain found to have pelvic abscess and acute DVT involving her lower extremities.  See below for further details.  Significant Hospital events: 2/16>> admit to [facility] for pelvic abscess and bilateral lower extremity DVT.  Significant imaging studies: 2/16>> CT abdomen/pelvis: Multiple pelvic/abdominal abscesses.  DVT involving common/external iliac veins in the left and common femoral vein on the right.  Significant microbiology data: 2/16>> COVID/influenza PCR: Negative 2/16>> urine culture: Pending 2/17>> blood culture: Pending  Procedures: None  Consults:  [facility] IR  Subjective: Continues to have some pain in her pelvic area.  Objective: Vitals: Blood pressure 100/70, pulse 68, temperature 98.8 F (37.1 C), temperature source Oral, resp. rate 15, height 4\' 11"  (1.499 m), weight 66.2 kg, last menstrual period 06/08/2021, SpO2 100 %, unknown if currently breastfeeding.   Exam: Gen Exam:Alert awake-not in any distress HEENT:atraumatic, normocephalic Chest: B/L clear to auscultation anteriorly CVS:S1S2 regular Abdomen: Soft-ostomy in place-mildly tender lower abdomen. Extremities:no edema Neurology: Non focal Skin: no rash  Pertinent Labs/Radiology: CBC Latest Ref Rng & Units 01/23/2022 01/22/2022 04/15/2021  WBC 4.0 - 10.5 K/uL 4.5 5.3 4.9  Hemoglobin 12.0 - 15.0 g/dL 12.6 13.9 12.8  Hematocrit 36.0 - 46.0 % 38.1 41.2 39.1  Platelets 150 - 400 K/uL 144(L) 171 269    Lab Results   Component Value Date   NA 137 01/23/2022   K 3.7 01/23/2022   CL 107 01/23/2022   CO2 21 (L) 01/23/2022      Assessment/Plan: Pelvic abscess: Likely a sequelae of her recent C-section/hemicolectomy-continue IV antibiotics-IR consulted for drain placement.  General surgery following.  Follow culture data.  Acute bilateral lower extremity DVT: On IV heparin-currently on hold for IR procedure-will need to be resumed post IR procedure.  Addendum:4:50 pm D/w Dr Francis Gaines to resume heparin gtt  Recent pregnancy resulting in emergent C-section-Per patient-this was due to numerous placental issues-claims that the baby did not survive.  She is not breast-feeding.  BMI: Estimated body mass index is 29.49 kg/m as calculated from the following:   Height as of this encounter: 4\' 11"  (1.499 m).   Weight as of this encounter: 66.2 kg.   Code status:   Code Status: Full Code   DVT Prophylaxis: IV heparin  Family Communication: None at bedside   Disposition Plan: Status is: Inpatient Remains inpatient appropriate because: Multiple pelvic abscesses-awaiting drain placement by IR-on IV antibiotics.   Planned Discharge Destination:Home   Diet: Diet Order             Diet NPO time specified  Diet effective now                     Antimicrobial agents: Anti-infectives (From admission, onward)    Start     Dose/Rate Route Frequency Ordered Stop   01/23/22 0800  piperacillin-tazobactam (ZOSYN) IVPB 3.375 g        3.375 g 12.5 mL/hr over  240 Minutes Intravenous Every 8 hours 01/23/22 0054     01/23/22 0100  piperacillin-tazobactam (ZOSYN) IVPB 3.375 g        3.375 g 100 mL/hr over 30 Minutes Intravenous  Once 01/23/22 0054 01/23/22 0204        MEDICATIONS: Scheduled Meds:  lidocaine       Continuous Infusions:  lactated ringers 100 mL/hr at 01/23/22 0131   piperacillin-tazobactam (ZOSYN)  IV 3.375 g (01/23/22 0822)   PRN Meds:.acetaminophen **OR** acetaminophen,  morphine injection, ondansetron **OR** ondansetron (ZOFRAN) IV   I have personally reviewed following labs and imaging studies  LABORATORY DATA: CBC: Recent Labs  Lab 01/22/22 1739 01/23/22 0337  WBC 5.3 4.5  NEUTROABS 3.1  --   HGB 13.9 12.6  HCT 41.2 38.1  MCV 89.0 89.4  PLT 171 144*    Basic Metabolic Panel: Recent Labs  Lab 01/22/22 1739 01/23/22 0337  NA 135 137  K 4.0 3.7  CL 102 107  CO2 24 21*  GLUCOSE 96 93  BUN 9 8  CREATININE 0.77 0.69  CALCIUM 9.4 9.1    GFR: Estimated Creatinine Clearance: 85.8 mL/min (by C-G formula based on SCr of 0.69 mg/dL).  Liver Function Tests: Recent Labs  Lab 01/22/22 1739 01/23/22 0337  AST 19 16  ALT 17 19  ALKPHOS 158* 142*  BILITOT 0.4 0.5  PROT 8.1 7.1  ALBUMIN 3.9 3.5   Recent Labs  Lab 01/22/22 1739  LIPASE 43   No results for input(s): AMMONIA in the last 168 hours.  Coagulation Profile: No results for input(s): INR, PROTIME in the last 168 hours.  Cardiac Enzymes: No results for input(s): CKTOTAL, CKMB, CKMBINDEX, TROPONINI in the last 168 hours.  BNP (last 3 results) No results for input(s): PROBNP in the last 8760 hours.  Lipid Profile: No results for input(s): CHOL, HDL, LDLCALC, TRIG, CHOLHDL, LDLDIRECT in the last 72 hours.  Thyroid Function Tests: No results for input(s): TSH, T4TOTAL, FREET4, T3FREE, THYROIDAB in the last 72 hours.  Anemia Panel: No results for input(s): VITAMINB12, FOLATE, FERRITIN, TIBC, IRON, RETICCTPCT in the last 72 hours.  Urine analysis:    Component Value Date/Time   COLORURINE AMBER (A) 01/22/2022 1647   APPEARANCEUR HAZY (A) 01/22/2022 1647   LABSPEC 1.015 01/22/2022 1647   PHURINE 6.0 01/22/2022 1647   GLUCOSEU NEGATIVE 01/22/2022 1647   HGBUR MODERATE (A) 01/22/2022 1647   BILIRUBINUR NEGATIVE 01/22/2022 1647   KETONESUR NEGATIVE 01/22/2022 1647   PROTEINUR 30 (A) 01/22/2022 1647   UROBILINOGEN 1.0 09/04/2021 1157   NITRITE POSITIVE (A) 01/22/2022  1647   LEUKOCYTESUR MODERATE (A) 01/22/2022 1647    Sepsis Labs: Lactic Acid, Venous No results found for: LATICACIDVEN  MICROBIOLOGY: Recent Results (from the past 240 hour(s))  Resp Panel by RT-PCR (Flu A&B, Covid) Nasopharyngeal Swab     Status: None   Collection Time: 01/22/22 10:35 PM   Specimen: Nasopharyngeal Swab; Nasopharyngeal(NP) swabs in vial transport medium  Result Value Ref Range Status   SARS Coronavirus 2 by RT PCR NEGATIVE NEGATIVE Final    Comment: (NOTE) SARS-CoV-2 target nucleic acids are NOT DETECTED.  The SARS-CoV-2 RNA is generally detectable in upper respiratory specimens during the acute phase of infection. The lowest concentration of SARS-CoV-2 viral copies this assay can detect is 138 copies/mL. A negative result does not preclude SARS-Cov-2 infection and should not be used as the sole basis for treatment or other patient management decisions. A negative result may occur with  improper  specimen collection/handling, submission of specimen other than nasopharyngeal swab, presence of viral mutation(s) within the areas targeted by this assay, and inadequate number of viral copies(<138 copies/mL). A negative result must be combined with clinical observations, patient history, and epidemiological information. The expected result is Negative.  Fact Sheet for Patients:  EntrepreneurPulse.com.au  Fact Sheet for Healthcare Providers:  IncredibleEmployment.be  This test is no t yet approved or cleared by the [place] FDA and  has been authorized for detection and/or diagnosis of SARS-CoV-2 by FDA under an Emergency Use Authorization (EUA). This EUA will remain  in effect (meaning this test can be used) for the duration of the COVID-19 declaration under Section 564(b)(1) of the Act, 21 U.S.C.section 360bbb-3(b)(1), unless the authorization is terminated  or revoked sooner.       Influenza A by PCR NEGATIVE NEGATIVE  Final   Influenza B by PCR NEGATIVE NEGATIVE Final    Comment: (NOTE) The Xpert Xpress SARS-CoV-2/FLU/RSV plus assay is intended as an aid in the diagnosis of influenza from Nasopharyngeal swab specimens and should not be used as a sole basis for treatment. Nasal washings and aspirates are unacceptable for Xpert Xpress SARS-CoV-2/FLU/RSV testing.  Fact Sheet for Patients: EntrepreneurPulse.com.au  Fact Sheet for Healthcare Providers: IncredibleEmployment.be  This test is not yet approved or cleared by the [place] FDA and has been authorized for detection and/or diagnosis of SARS-CoV-2 by FDA under an Emergency Use Authorization (EUA). This EUA will remain in effect (meaning this test can be used) for the duration of the COVID-19 declaration under Section 564(b)(1) of the Act, 21 U.S.C. section 360bbb-3(b)(1), unless the authorization is terminated or revoked.  Performed at [facility], [place] [address], [place], [place] [address]     RADIOLOGY STUDIES/RESULTS: CT ABDOMEN PELVIS W CONTRAST  Result Date: 01/22/2022 CLINICAL DATA:  Abdominal pain following surgery in [place]. EXAM: CT ABDOMEN AND PELVIS WITH CONTRAST TECHNIQUE: Multidetector CT imaging of the abdomen and pelvis was performed using the standard protocol following bolus administration of intravenous contrast. RADIATION DOSE REDUCTION: This exam was performed according to the departmental dose-optimization program which includes automated exposure control, adjustment of the mA and/or kV according to patient size and/or use of iterative reconstruction technique. CONTRAST:  116mL OMNIPAQUE IOHEXOL 300 MG/ML  SOLN COMPARISON:  None. FINDINGS: Lower chest: No acute abnormality. Hepatobiliary: Hypodense region is seen in the left lobe of the liver adjacent to the falciform ligament, likely representing focal fatty infiltration. There is an ill-defined hypodense region in the anterior  right lobe of the liver at the gallbladder fossa which can not be further characterized on this single-phase exam. The gallbladder is without stones. No biliary ductal dilatation. Pancreas: Unremarkable. No pancreatic ductal dilatation or surrounding inflammatory changes. Spleen: Normal in size without focal abnormality. Adrenals/Urinary Tract: The adrenal glands are within normal limits. A nonobstructive calculus is noted in the lower pole the left kidney. No obstructive uropathy bilaterally. There is bladder wall thickening with surrounding fat stranding which may be related to local inflammatory changes. Stomach/Bowel: The stomach is within normal limits. No bowel obstruction, free air, or pneumatosis. The patient is status post presumed right hemicolectomy with a right lower quadrant ostomy. A few scattered diverticula are noted in the remaining colon. There is mild small bowel wall thickening in the right lower quadrant which is likely related to local inflammatory changes. Vascular/Lymphatic: A filling defect is present in the common and external iliac veins on the left and common femoral vein on  the right., compatible with deep venous thrombosis. The aorta is normal in caliber. No definite abdominal or pelvic lymphadenopathy by size criteria. Reproductive: Not well seen due to inflammatory changes and lack of enteric contrast. Per PA Hanley Seamen, status post hysterectomy. Other: A rim enhancing collection is noted in the pelvis in the left lower quadrant measuring 7.1 x 6.0 x 4.9 cm, concerning for abscess. There is a linear rim enhancing hypodense collection in the pelvis in the region of prior surgery best seen on coronal image 47. Fat stranding and a small amount of free fluid are noted in the pelvis. Musculoskeletal: No acute osseous abnormality. IMPRESSION: 1. Findings compatible with deep venous thrombosis involving the common and external iliac veins on the left and common femoral vein on the right. 2.  Status post surgery, possible hysterectomy per PA [facility] There is a rim enhancing collection containing air in the pelvis on the left measuring 7.1 x 6.0 x 4.9 cm, likely abscess. There is a linear hypodense collection with rim enhancement and air in the lower abdomen measuring 7.6 x 0.4 x 0.8 cm, possible abscess. Surgical consultation is recommended. 3. Bladder wall thickening with surrounding fat stranding, possible infectious or inflammatory cystitis. 4. Small hypodense collection in the low anterior abdominal wall measuring 1.2 x 1.1 x 1.0 cm in the region of prior surgery, possible phlegmon versus small abscess. 5. Status post right hemicolectomy with right lower quadrant ostomy. No bowel obstruction or free air. There is mild thickening of a few loops of small bowel in the pelvis on the right which is likely related to local inflammatory changes. 6. Left renal calculus. 7. Remaining findings as described above. Critical findings were reported to PA Soto at 9:39 p.m. Electronically Signed   By: Brett Fairy M.D.   On: 01/22/2022 21:54     LOS: 1 day   Oren Binet, MD  [facility]    To contact the attending provider between 7A-7P or the covering provider during after hours 7P-7A, please log into the web site [email or web address] and access using universal  password for that web site. If you do not have the password, please call the hospital operator.  01/23/2022, 11:43 AM

## 2022-01-23 NOTE — Sedation Documentation (Signed)
No sedation administered during procedure due to patients BP being 89/55.  Patient was given local only.  Patient tolerated the procedure well with no issues.

## 2022-01-23 NOTE — Assessment & Plan Note (Signed)
DVT involving the common and external iliac veins on the left and common femoral vein on the right seen on CT A/P.  Likely provoked in setting of recent hospitalization in [place]. -Started on IV heparin drip, okay per general surgery -Consult to IR for further recommendations

## 2022-01-23 NOTE — H&P (Signed)
Chief Complaint: Patient was seen in consultation today for  Chief Complaint  Patient presents with   Abdominal Pain   Supervising Physician: Oley Balm  Patient Status: [facility] - ED  History of Present Illness:  Maria Riley is a 31 y.o. female with medical history significant for reported emergency cesarean section in [place] with complications resulting in right hemicolectomy with RLQ ostomy creation and presumed hysterectomy who presented to the ED for evaluation of abdominal pain.     History acquired with assistance of [employer] interpreter, Maureen Ralphs [phone], She has had persistent abdominal pain, more pronounced in the epigastric region.  She has had loose watery output from her ostomy.  She has had nausea and vomiting with poor oral intake.  She has noted some cramping in both of her calves with worse lower extremity pain with ambulation.  She also has had dysuria with urinary frequency.   CT abdomen/pelvis with contrast showed DVT involving the common and external iliac veins on the left and common femoral vein on the right.  Postsurgical changes, possible hysterectomy seen with a rim-enhancing collection at the left pelvis and another at the lower abdomen concerning for abscesses.  Bladder wall thickening, small hypodense collection in low anterior abdominal wall noted.   IR consulted to drain pelvic abscess.  Dr. Deanne Coffer reviewed and approves the case with CT.  Past Medical History:  Diagnosis Date   Medical history non-contributory     Past Surgical History:  Procedure Laterality Date   CESAREAN SECTION N/A 06/13/2018   Procedure: CESAREAN SECTION;  Surgeon: Hagerman Bing, MD;  Location: [facility] BIRTHING SUITES;  Service: Obstetrics;  Laterality: N/A;   MOUTH SURGERY     WISDOM TOOTH EXTRACTION Bilateral     Allergies: Tape  Medications: Prior to Admission medications   Medication Sig Start Date End Date Taking? Authorizing Provider  oxyCODONE (OXY IR/ROXICODONE) 5  MG immediate release tablet Take 5 mg by mouth every 4 (four) hours as needed for moderate pain. 01/19/22  Yes [provider]  butalbital-acetaminophen-caffeine (FIORICET) 50-325-40 MG tablet Take 2 tablets by mouth every 4 (four) hours as needed for headache. Patient not taking: Reported on 01/23/2022 07/13/21   Marylene Land, CNM  glycopyrrolate (ROBINUL) 1 MG tablet Take 1 tablet (1 mg total) by mouth 3 (three) times daily. Patient not taking: Reported on 01/23/2022 07/13/21   Marylene Land, CNM  ondansetron (ZOFRAN-ODT) 8 MG disintegrating tablet Take 1 tablet (8 mg total) by mouth every 8 (eight) hours as needed for nausea or vomiting. Patient not taking: Reported on 01/23/2022 07/13/21   Marylene Land, CNM  oseltamivir (TAMIFLU) 75 MG capsule Take 1 capsule (75 mg total) by mouth every 12 (twelve) hours. Patient not taking: Reported on 01/23/2022 11/06/21   Merrilee Jansky, MD  Prenatal Vit-Fe Fumarate-FA (PRENATAL COMPLETE) 14-0.4 MG TABS Take 1 tablet by mouth daily. Patient not taking: Reported on 01/23/2022 03/31/21   Ivette Loyal, NP  fluticasone [facility]) 50 MCG/ACT nasal spray Place 1 spray into both nostrils daily. 01/02/21 04/25/21  Burky, Barron Alvine, NP  LO LOESTRIN FE 1 MG-10 MCG / 10 MCG tablet Take 1 tablet by mouth daily. 03/06/20 07/06/20  Brock Bad, MD  medroxyPROGESTERone (DEPO-PROVERA) 150 MG/ML injection Inject 1 mL (150 mg total) into the muscle every 3 (three) months. Patient not taking: Reported on 11/14/2018 07/18/18 07/06/20  Brock Bad, MD     Family History  Problem Relation Age of Onset   Healthy  Father    Alcohol abuse Neg Hx    Arthritis Neg Hx    Asthma Neg Hx    Birth defects Neg Hx    Cancer Neg Hx    COPD Neg Hx    Depression Neg Hx    Diabetes Neg Hx    Drug abuse Neg Hx    Early death Neg Hx    Hearing loss Neg Hx    Heart disease Neg Hx    Hyperlipidemia Neg Hx    Hypertension Neg Hx    Kidney  disease Neg Hx    Learning disabilities Neg Hx    Mental illness Neg Hx    Mental retardation Neg Hx    Miscarriages / Stillbirths Neg Hx    Stroke Neg Hx    Vision loss Neg Hx    Varicose Veins Neg Hx     Social History   Socioeconomic History   Marital status: Single    Spouse name: Not on file   Number of children: Not on file   Years of education: Not on file   Highest education level: Not on file  Occupational History   Not on file  Tobacco Use   Smoking status: Never   Smokeless tobacco: Never  Vaping Use   Vaping Use: Never used  Substance and Sexual Activity   Alcohol use: No   Drug use: No   Sexual activity: Yes    Birth control/protection: None  Other Topics Concern   Not on file  Social History Narrative   Not on file   Social Determinants of Health   Financial Resource Strain: Not on file  Food Insecurity: Not on file  Transportation Needs: Not on file  Physical Activity: Not on file  Stress: Not on file  Social Connections: Not on file     Review of Systems: A 12 point ROS discussed and pertinent positives are indicated in the HPI above.  All other systems are negative.  Review of Systems  Constitutional:  Positive for fatigue.  HENT: Negative.    Eyes: Negative.   Respiratory: Negative.    Cardiovascular: Negative.   Gastrointestinal:  Positive for abdominal pain.  Endocrine: Negative.   Genitourinary:  Positive for dysuria and frequency.  Musculoskeletal: Negative.   Skin: Negative.   Allergic/Immunologic: Negative.   Neurological: Negative.   Hematological: Negative.   Psychiatric/Behavioral: Negative.     Vital Signs: BP 98/74    Pulse 70    Temp 98.8 F (37.1 C) (Oral)    Resp 14    Ht 4\' 11"  (1.499 m)    Wt 146 lb (66.2 kg)    LMP 06/08/2021 (Approximate)    SpO2 97%    BMI 29.49 kg/m   Physical Exam Constitutional:      Appearance: She is not ill-appearing or toxic-appearing.  HENT:     Head: Normocephalic.      Mouth/Throat:     Mouth: Mucous membranes are moist.     Pharynx: Oropharynx is clear.  Cardiovascular:     Rate and Rhythm: Normal rate and regular rhythm.  Pulmonary:     Effort: Pulmonary effort is normal.     Breath sounds: Normal breath sounds.  Abdominal:     General: Abdomen is flat. A surgical scar is present.     Palpations: Abdomen is soft.     Tenderness: There is abdominal tenderness in the periumbilical area and left lower quadrant.     Comments: Right ostomy  Skin:  General: Skin is warm and dry.     Capillary Refill: Capillary refill takes less than 2 seconds.  Neurological:     General: No focal deficit present.     Mental Status: She is alert and oriented to person, place, and time.  Psychiatric:        Mood and Affect: Mood normal.        Behavior: Behavior normal.    Imaging: CT ABDOMEN PELVIS W CONTRAST  Result Date: 01/22/2022 CLINICAL DATA:  Abdominal pain following surgery in [place]. EXAM: CT ABDOMEN AND PELVIS WITH CONTRAST TECHNIQUE: Multidetector CT imaging of the abdomen and pelvis was performed using the standard protocol following bolus administration of intravenous contrast. RADIATION DOSE REDUCTION: This exam was performed according to the departmental dose-optimization program which includes automated exposure control, adjustment of the mA and/or kV according to patient size and/or use of iterative reconstruction technique. CONTRAST:  OMNIPAQUE IOHEXOL 300 MG/ML  SOLN COMPARISON:  None. FINDINGS: Lower chest: No acute abnormality. Hepatobiliary: Hypodense region is seen in the left lobe of the liver adjacent to the falciform ligament, likely representing focal fatty infiltration. There is an ill-defined hypodense region in the anterior right lobe of the liver at the gallbladder fossa which can not be further characterized on this single-phase exam. The gallbladder is without stones. No biliary ductal dilatation. Pancreas: Unremarkable. No pancreatic  ductal dilatation or surrounding inflammatory changes. Spleen: Normal in size without focal abnormality. Adrenals/Urinary Tract: The adrenal glands are within normal limits. A nonobstructive calculus is noted in the lower pole the left kidney. No obstructive uropathy bilaterally. There is bladder wall thickening with surrounding fat stranding which may be related to local inflammatory changes. Stomach/Bowel: The stomach is within normal limits. No bowel obstruction, free air, or pneumatosis. The patient is status post presumed right hemicolectomy with a right lower quadrant ostomy. A few scattered diverticula are noted in the remaining colon. There is mild small bowel wall thickening in the right lower quadrant which is likely related to local inflammatory changes. Vascular/Lymphatic: A filling defect is present in the common and external iliac veins on the left and common femoral vein on the right., compatible with deep venous thrombosis. The aorta is normal in caliber. No definite abdominal or pelvic lymphadenopathy by size criteria. Reproductive: Not well seen due to inflammatory changes and lack of enteric contrast. Per PA Delsa Sale, status post hysterectomy. Other: A rim enhancing collection is noted in the pelvis in the left lower quadrant measuring 7.1 x 6.0 x 4.9 cm, concerning for abscess. There is a linear rim enhancing hypodense collection in the pelvis in the region of prior surgery best seen on coronal image 47. Fat stranding and a small amount of free fluid are noted in the pelvis. Musculoskeletal: No acute osseous abnormality. IMPRESSION: 1. Findings compatible with deep venous thrombosis involving the common and external iliac veins on the left and common femoral vein on the right. 2. Status post surgery, possible hysterectomy per [facility]. There is a rim enhancing collection containing air in the pelvis on the left measuring 7.1 x 6.0 x 4.9 cm, likely abscess. There is a linear hypodense  collection with rim enhancement and air in the lower abdomen measuring 7.6 x 0.4 x 0.8 cm, possible abscess. Surgical consultation is recommended. 3. Bladder wall thickening with surrounding fat stranding, possible infectious or inflammatory cystitis. 4. Small hypodense collection in the low anterior abdominal wall measuring 1.2 x 1.1 x 1.0 cm in the region of  prior surgery, possible phlegmon versus small abscess. 5. Status post right hemicolectomy with right lower quadrant ostomy. No bowel obstruction or free air. There is mild thickening of a few loops of small bowel in the pelvis on the right which is likely related to local inflammatory changes. 6. Left renal calculus. 7. Remaining findings as described above. Critical findings were reported to PA Soto at 9:39 p.m. Electronically Signed   By: Thornell SartoriusLaura  Taylor M.D.   On: 01/22/2022 21:54    Labs:  CBC: Recent Labs    04/15/21 1102 01/22/22 1739 01/23/22 0337  WBC 4.9 5.3 4.5  HGB 12.8 13.9 12.6  HCT 39.1 41.2 38.1  PLT 269 171 144*    COAGS: No results for input(s): INR, APTT in the last 8760 hours.  BMP: Recent Labs    04/15/21 1102 01/22/22 1739 01/23/22 0337  NA 137 135 137  K 3.6 4.0 3.7  CL 106 102 107  CO2 26 24 21*  GLUCOSE 87 96 93  BUN 9 9 8   CALCIUM 9.3 9.4 9.1  CREATININE 0.48 0.77 0.69  GFRNONAA >60 >60 >60    LIVER FUNCTION TESTS: Recent Labs    04/15/21 1102 01/22/22 1739 01/23/22 0337  BILITOT 0.5 0.4 0.5  AST 21 19 16   ALT 20 17 19   ALKPHOS 63 158* 142*  PROT 7.3 8.1 7.1  ALBUMIN 4.1 3.9 3.5      Assessment and Plan:  Pelvic abscess --complication after multiple abdominal surgeries in January, beginning with emergent c-section resulting in infant demise --Amenable to drainage  B/L DVTs --stopped heparin gtt for procedure   Risks and benefits discussed with the patient including bleeding, infection, damage to adjacent structures, bowel perforation/fistula connection, and sepsis.  All of  the patient's questions were answered, patient is agreeable to proceed. Consent signed and in chart.   Thank you for this interesting consult.  I greatly enjoyed meeting Maria Riley and look forward to participating in their care.  A copy of this report was sent to the requesting provider on this date.  Electronically Signed: Sheliah PlaneHayley Felisa Zechman, PA 01/23/2022, 10:01 AM   I spent a total of 40 Minutes in face to face in clinical consultation, greater than 50% of which was counseling/coordinating care for pelvic abscess drain.

## 2022-01-23 NOTE — Progress Notes (Addendum)
Pharmacy Antibiotic Note  Maria Riley is a 32 y.o. female admitted on 01/22/2022 with  CT concerning for intra-abdominal abscess .  Pharmacy has been consulted for Zosyn dosing.  Plan: Zosyn 3.375g IV q8h (4 hour infusion).     Temp (24hrs), Avg:98.8 F (37.1 C), Min:98.8 F (37.1 C), Max:98.8 F (37.1 C)  Recent Labs  Lab 01/22/22 1739  WBC 5.3  CREATININE 0.77    Estimated Creatinine Clearance: 85.8 mL/min (by C-G formula based on SCr of 0.77 mg/dL).    Allergies  Allergen Reactions   Tape     Adhesive tape- rash    Thank you for allowing pharmacy to be a part of this patients care.  Vernard Gambles, PharmD, BCPS  01/23/2022 12:51 AM

## 2022-01-23 NOTE — Progress Notes (Signed)
ANTICOAGULATION CONSULT NOTE   Pharmacy Consult for heparin Indication: DVT  Allergies  Allergen Reactions   Tape     Adhesive tape- rash    Patient Measurements: Height: 4\' 11"  (149.9 cm) Weight: 66.2 kg (146 lb) IBW/kg (Calculated) : 43.2 Heparin Dosing Weight: 66 kg  Vital Signs: BP: 98/74 (02/17 0430) Pulse Rate: 82 (02/17 0430)  Labs: Recent Labs    01/22/22 1739 01/23/22 0337 01/23/22 0635  HGB 13.9 12.6  --   HCT 41.2 38.1  --   PLT 171 144*  --   HEPARINUNFRC  --   --  0.66  CREATININE 0.77 0.69  --      Estimated Creatinine Clearance: 85.8 mL/min (by C-G formula based on SCr of 0.69 mg/dL).   Medical History: Past Medical History:  Diagnosis Date   Medical history non-contributory     Medications:  (Not in a hospital admission)  Assessment: 5 YOF with acute DVT involving the R iliac and common femoral veins to start IV Heparin. H/H and Plt wnl. SCr wnl   Initial heparin level therapeutic on 1100 units/hr, may need IR for abscess drainage  Goal of Therapy:  Heparin level 0.3-0.7 units/ml Monitor platelets by anticoagulation protocol: Yes   Plan:  Continue heparin gtt at 1100 units/hr Daily heparin level, CBC, s/s bleeding F/u IR plans and ability to transition to PO  37, PharmD Clinical Pharmacist ED Pharmacist Phone # [phone] 01/23/2022 7:33 AM

## 2022-01-23 NOTE — ED Notes (Signed)
Pt transported to IR 

## 2022-01-23 NOTE — ED Notes (Signed)
Patient transported to CT 

## 2022-01-24 DIAGNOSIS — K651 Peritoneal abscess: Secondary | ICD-10-CM | POA: Diagnosis not present

## 2022-01-24 DIAGNOSIS — N739 Female pelvic inflammatory disease, unspecified: Secondary | ICD-10-CM | POA: Diagnosis not present

## 2022-01-24 LAB — CBC
HCT: 36.3 % (ref 36.0–46.0)
Hemoglobin: 12 g/dL (ref 12.0–15.0)
MCH: 29.9 pg (ref 26.0–34.0)
MCHC: 33.1 g/dL (ref 30.0–36.0)
MCV: 90.3 fL (ref 80.0–100.0)
Platelets: 143 10*3/uL — ABNORMAL LOW (ref 150–400)
RBC: 4.02 MIL/uL (ref 3.87–5.11)
RDW: 14.9 % (ref 11.5–15.5)
WBC: 4.9 10*3/uL (ref 4.0–10.5)
nRBC: 0 % (ref 0.0–0.2)

## 2022-01-24 LAB — BASIC METABOLIC PANEL
Anion gap: 9 (ref 5–15)
BUN: 5 mg/dL — ABNORMAL LOW (ref 6–20)
CO2: 20 mmol/L — ABNORMAL LOW (ref 22–32)
Calcium: 8.8 mg/dL — ABNORMAL LOW (ref 8.9–10.3)
Chloride: 106 mmol/L (ref 98–111)
Creatinine, Ser: 0.74 mg/dL (ref 0.44–1.00)
GFR, Estimated: >60 mL/min (ref >60)
Glucose, Bld: 89 mg/dL (ref 70–99)
Potassium: 3.7 mmol/L (ref 3.5–5.1)
Sodium: 135 mmol/L (ref 135–145)

## 2022-01-24 LAB — HEPARIN LEVEL (UNFRACTIONATED)
Heparin Unfractionated: 0.3 IU/mL (ref 0.30–0.70)
Heparin Unfractionated: 0.83 IU/mL — ABNORMAL HIGH (ref 0.30–0.70)
Heparin Unfractionated: 0.64 IU/mL (ref 0.30–0.70)

## 2022-01-24 LAB — URINE CULTURE

## 2022-01-24 MED ORDER — LACTATED RINGERS IV SOLN: INTRAVENOUS | Status: DC

## 2022-01-24 NOTE — Progress Notes (Signed)
ANTICOAGULATION CONSULT NOTE - Follow Up Consult  Pharmacy Consult for heparin Indication: DVT  Labs: Recent Labs    01/22/22 1739 01/23/22 0337 01/23/22 0635 01/24/22 0046  HGB 13.9 12.6  --  12.0  HCT 41.2 38.1  --  36.3  PLT 171 144*  --  143*  HEPARINUNFRC  --   --  0.66 0.30  CREATININE 0.77 0.69  --  0.74    Assessment/Plan:  30yo female therapeutic on heparin after resumed post-IR; level at low end of goal but expect level to increase some as lab was drawn ~3h after resumed. Will continue infusion at current rate of 1100 units/hr and confirm stable with additional level.   Vernard Gambles, PharmD, BCPS  01/24/2022,2:25 AM

## 2022-01-24 NOTE — Progress Notes (Signed)
Subjective/Chief Complaint: Bleeding around IR drain    Objective: Vital signs in last 24 hours: Temp:  [97.7 F (36.5 C)-98.1 F (36.7 C)] 97.7 F (36.5 C) (02/18 0458) Pulse Rate:  [63-85] 63 (02/18 0458) Resp:  [14-19] 18 (02/18 0458) BP: (86-107)/(55-72) 91/66 (02/18 0458) SpO2:  [99 %-100 %] 99 % (02/18 0458) Last BM Date : 01/23/22  Intake/Output from previous day: 02/17 0701 - 02/18 0700 In: 1230 [P.O.:480; I.V.:700; IV Piggyback:50] Out: 613 [Drains:13; Stool:600] Intake/Output this shift: No intake/output data recorded.  Abdomen: scar noted  ostomy pink and functioning oozing around IR drain is minima  dressing changed   Lab Results:  Recent Labs    01/23/22 0337 01/24/22 0046  WBC 4.5 4.9  HGB 12.6 12.0  HCT 38.1 36.3  PLT 144* 143*   BMET Recent Labs    01/23/22 0337 01/24/22 0046  NA 137 135  K 3.7 3.7  CL 107 106  CO2 21* 20*  GLUCOSE 93 89  BUN 8 5*  CREATININE 0.69 0.74  CALCIUM 9.1 8.8*   PT/INR No results for input(s): LABPROT, INR in the last 72 hours. ABG No results for input(s): PHART, HCO3 in the last 72 hours.  Invalid input(s): PCO2, PO2  Studies/Results: CT Angio Chest Pulmonary Embolism (PE) W or WO Contrast  Addendum Date: 01/23/2022   ADDENDUM REPORT: 01/23/2022 23:42 ADDENDUM: Critical findings were reported to NP Lovey Newcomer at 11:41 p.m. Electronically Signed   By: Brett Fairy M.D.   On: 01/23/2022 23:42   Result Date: 01/23/2022 CLINICAL DATA:  Pulmonary embolism suspected, positive D-dimer. Known bilateral DVTs. EXAM: CT ANGIOGRAPHY CHEST WITH CONTRAST TECHNIQUE: Multidetector CT imaging of the chest was performed using the standard protocol during bolus administration of intravenous contrast. Multiplanar CT image reconstructions and MIPs were obtained to evaluate the vascular anatomy. RADIATION DOSE REDUCTION: This exam was performed according to the departmental dose-optimization program which includes automated  exposure control, adjustment of the mA and/or kV according to patient size and/or use of iterative reconstruction technique. CONTRAST:  83mL OMNIPAQUE IOHEXOL 350 MG/ML SOLN COMPARISON:  01/22/2022. FINDINGS: Cardiovascular: The heart is normal in size and there is no pericardial effusion. The aorta and pulmonary trunk are normal in caliber. Filling defects are noted in the left pulmonary artery extending into the left upper and lower lobe lobar and segmental arteries. Segmental and subsegmental pulmonary artery filling defects are present in the right lower lobe. No evidence of right heart strain. Mediastinum/Nodes: No enlarged mediastinal, hilar, or axillary lymph nodes. Thyroid gland, trachea, and esophagus demonstrate no significant findings. Lungs/Pleura: Lungs are clear. No pleural effusion or pneumothorax. Upper Abdomen: No acute abnormality. Musculoskeletal: No chest wall abnormality. No acute or significant osseous findings. Review of the MIP images confirms the above findings. IMPRESSION: Bilateral pulmonary emboli, moderate thrombus burden. No evidence of right heart strain. Electronically Signed: By: Brett Fairy M.D. On: 01/23/2022 23:37   CT ABDOMEN PELVIS W CONTRAST  Addendum Date: 01/23/2022   ADDENDUM REPORT: 01/23/2022 20:31 ADDENDUM: Critical findings for addendum were reported to nurse practitioner Lovey Newcomer at 8:29 p.m. Electronically Signed   By: Brett Fairy M.D.   On: 01/23/2022 20:31   Addendum Date: 01/23/2022   ADDENDUM REPORT: 01/23/2022 20:12 ADDENDUM: Upon re-review of CT abdomen and pelvis with contrast performed 02/01/2022, the following findings are noted: A suspected filling defect is present in a right lower lobe segmental pulmonary artery, concerning for pulmonary embolism. CTA chest is recommended for further evaluation.  Electronically Signed   By: Brett Fairy M.D.   On: 01/23/2022 20:12   Result Date: 01/23/2022 CLINICAL DATA:  Abdominal pain following surgery in  [place]. EXAM: CT ABDOMEN AND PELVIS WITH CONTRAST TECHNIQUE: Multidetector CT imaging of the abdomen and pelvis was performed using the standard protocol following bolus administration of intravenous contrast. RADIATION DOSE REDUCTION: This exam was performed according to the departmental dose-optimization program which includes automated exposure control, adjustment of the mA and/or kV according to patient size and/or use of iterative reconstruction technique. CONTRAST:  118mL OMNIPAQUE IOHEXOL 300 MG/ML  SOLN COMPARISON:  None. FINDINGS: Lower chest: No acute abnormality. Hepatobiliary: Hypodense region is seen in the left lobe of the liver adjacent to the falciform ligament, likely representing focal fatty infiltration. There is an ill-defined hypodense region in the anterior right lobe of the liver at the gallbladder fossa which can not be further characterized on this single-phase exam. The gallbladder is without stones. No biliary ductal dilatation. Pancreas: Unremarkable. No pancreatic ductal dilatation or surrounding inflammatory changes. Spleen: Normal in size without focal abnormality. Adrenals/Urinary Tract: The adrenal glands are within normal limits. A nonobstructive calculus is noted in the lower pole the left kidney. No obstructive uropathy bilaterally. There is bladder wall thickening with surrounding fat stranding which may be related to local inflammatory changes. Stomach/Bowel: The stomach is within normal limits. No bowel obstruction, free air, or pneumatosis. The patient is status post presumed right hemicolectomy with a right lower quadrant ostomy. A few scattered diverticula are noted in the remaining colon. There is mild small bowel wall thickening in the right lower quadrant which is likely related to local inflammatory changes. Vascular/Lymphatic: A filling defect is present in the common and external iliac veins on the left and common femoral vein on the right., compatible with deep venous  thrombosis. The aorta is normal in caliber. No definite abdominal or pelvic lymphadenopathy by size criteria. Reproductive: Not well seen due to inflammatory changes and lack of enteric contrast. Per PA Hanley Seamen, status post hysterectomy. Other: A rim enhancing collection is noted in the pelvis in the left lower quadrant measuring 7.1 x 6.0 x 4.9 cm, concerning for abscess. There is a linear rim enhancing hypodense collection in the pelvis in the region of prior surgery best seen on coronal image 47. Fat stranding and a small amount of free fluid are noted in the pelvis. Musculoskeletal: No acute osseous abnormality. IMPRESSION: 1. Findings compatible with deep venous thrombosis involving the common and external iliac veins on the left and common femoral vein on the right. 2. Status post surgery, possible hysterectomy per PA [facility]. There is a rim enhancing collection containing air in the pelvis on the left measuring 7.1 x 6.0 x 4.9 cm, likely abscess. There is a linear hypodense collection with rim enhancement and air in the lower abdomen measuring 7.6 x 0.4 x 0.8 cm, possible abscess. Surgical consultation is recommended. 3. Bladder wall thickening with surrounding fat stranding, possible infectious or inflammatory cystitis. 4. Small hypodense collection in the low anterior abdominal wall measuring 1.2 x 1.1 x 1.0 cm in the region of prior surgery, possible phlegmon versus small abscess. 5. Status post right hemicolectomy with right lower quadrant ostomy. No bowel obstruction or free air. There is mild thickening of a few loops of small bowel in the pelvis on the right which is likely related to local inflammatory changes. 6. Left renal calculus. 7. Remaining findings as described above. Critical findings were reported to PA  [place] at 9:39 p.m. Electronically Signed: By: Brett Fairy M.D. On: 01/22/2022 21:54   CT IMAGE GUIDED DRAINAGE BY PERCUTANEOUS CATHETER  Result Date: 01/23/2022 CLINICAL DATA:   Post hysterectomy and partial bowel resection at outside institution. Complex 7.1 cm left pelvic collection. Abdominal pain. EXAM: CT GUIDED DRAINAGE OF PELVIC ABSCESS ANESTHESIA/SEDATION: Lidocaine 1% subcutaneous. Moderate sedation was not utilized because of hypotension. PROCEDURE: The procedure, risks, benefits, and alternatives were explained to the patient. Questions regarding the procedure were encouraged and answered. The patient understands and consents to the procedure. Select axial scans through the pelvis were obtained. The complex left extraperitoneal collection was localized and an appropriate skin entry site was determined and marked. The operative field was prepped with chlorhexidinein a sterile fashion, and a sterile drape was applied covering the operative field. A sterile gown and sterile gloves were used for the procedure. Local anesthesia was provided with 1% Lidocaine. Under CT fluoroscopic guidance, a 19 gauge percutaneous entry needle advanced into the collection. Bloody fluid could be aspirated. Amplatz guidewire advanced easily within the collection, position confirmed on CT. Tract dilated to facilitate placement of 12 French pigtail drain catheter, placed centrally within the collection. A relatively small volume of bloody fluid could be aspirated. Given concerns this could represent infected hematoma, the catheter was left in place, secured externally with 0 Prolene suture and StatLock, and placed to suction bulb drainage. The aspirate was sent for Gram stain and culture. The patient tolerated the procedure well. COMPLICATIONS: None immediate FINDINGS: Complex left extraperitoneal fluid collection localized. 12 French pigtail drain catheter placed as above. Small volume bloody aspirate sent for Gram stain and culture. IMPRESSION: 1. Technically successful CT-guided left pelvic abscess drain catheter placement. RADIATION DOSE REDUCTION: This exam was performed according to the departmental  dose-optimization program which includes automated exposure control, adjustment of the mA and/or kV according to patient size and/or use of iterative reconstruction technique. Electronically Signed   By: Lucrezia Europe M.D.   On: 01/23/2022 16:18    Anti-infectives: Anti-infectives (From admission, onward)    Start     Dose/Rate Route Frequency Ordered Stop   01/23/22 0800  piperacillin-tazobactam (ZOSYN) IVPB 3.375 g        3.375 g 12.5 mL/hr over 240 Minutes Intravenous Every 8 hours 01/23/22 0054     01/23/22 0100  piperacillin-tazobactam (ZOSYN) IVPB 3.375 g        3.375 g 100 mL/hr over 30 Minutes Intravenous  Once 01/23/22 0054 01/23/22 [id number]       Assessment/Plan: Patient Active Problem List   Diagnosis Date Noted   UTI (urinary tract infection) 01/23/2022   Pelvic abscess in female 01/22/2022   Acute deep vein thrombosis (DVT) of iliac vein of left lower extremity ([place]) 01/22/2022   Acute deep vein thrombosis (DVT) of femoral vein of right lower extremity ([place]) 01/22/2022   SAB (spontaneous abortion) 04/29/2021   Overdose 12/19/2020   MDD (major depressive disorder), recurrent episode, severe ([place]) 07/08/2018   DIC (disseminated intravascular coagulation) ([place]) 06/13/2018   Antepartum placental abruption 06/13/2018   PPH (postpartum hemorrhage) 06/13/2018   Language barrier 06/13/2018   Supervision of high risk pregnancy, antepartum 11/23/2016   History of preterm delivery 11/23/2016     No acute surgical needs at this point  Recommend letting IR know about her drain issues   Follow up Monday by surgery since no acute issues at this point   Total time 15 minutes    LOS: 2 days  Marcello Moores A Ranen Doolin 01/24/2022

## 2022-01-24 NOTE — Progress Notes (Signed)
ANTICOAGULATION CONSULT NOTE - Follow Up Consult  Pharmacy Consult for Heparin Indication: pulmonary embolus and DVT  Allergies  Allergen Reactions   Tape     Adhesive tape- rash    Patient Measurements: Height: 4\' 11"  (149.9 cm) Weight: 66.2 kg (146 lb) IBW/kg (Calculated) : 43.2 kg Heparin Dosing Weight: 57.7 kg  Vital Signs: Temp: 97.7 F (36.5 C) (02/18 0458) Temp Source: Oral (02/18 0458) BP: 91/66 (02/18 0458) Pulse Rate: 63 (02/18 0458)  Labs: Recent Labs    01/22/22 1739 01/23/22 0337 01/23/22 0635 01/24/22 0046  HGB 13.9 12.6  --  12.0  HCT 41.2 38.1  --  36.3  PLT 171 144*  --  143*  HEPARINUNFRC  --   --  0.66 0.30  CREATININE 0.77 0.69  --  0.74    Estimated Creatinine Clearance: 85.8 mL/min (by C-G formula based on SCr of 0.74 mg/dL).   Assessment: 31 yo female presents with abdominal pain and was found to have a pelvic abscess, bilateral pulmonary emboli (moderate thrombus burden, no RHS), and bilateral lower extremity DVT. PTA the patient is not on anticoagulation. On 2/16, the patient underwent a CT-guided L pelvic abscess drain catheter placement. Pharmacy is consulted to dose heparin.   The heparin level is supratherapeutic at 0.83 while running at 1100 units/hr. Per the RN, the patient has minimal bleeding around the IR drain and there have been no issues with the infusion. Surgery has seen the patient and is aware of the bleeding around the IR drain. Hgb 12, platelets 143.  Goal of Therapy:  Heparin level 0.3-0.7 units/ml Monitor platelets by anticoagulation protocol: Yes   Plan:  Decrease heparin IV to 1000 units/hr Obtain a 6-hr heparin level Obtain a daily CBC and heparin level Monitor for signs and symptoms of bleeding   3/16, PharmD, RPh  PGY-2 Pharmacy Resident 01/24/2022 7:28 AM  Please check [email or web address] for unit-specific pharmacy phone numbers.

## 2022-01-24 NOTE — Progress Notes (Signed)
ANTICOAGULATION CONSULT NOTE   Pharmacy Consult for heparin Indication: DVT  Allergies  Allergen Reactions   Tape     Adhesive tape- rash    Patient Measurements: Height: 4\' 11"  (149.9 cm) Weight: 66.2 kg (146 lb) IBW/kg (Calculated) : 43.2 Heparin Dosing Weight: 66 kg  Vital Signs: Temp: 98.2 F (36.8 C) (02/18 0900) Temp Source: Oral (02/18 0900) BP: 100/67 (02/18 0900) Pulse Rate: 73 (02/18 0900)  Labs: Recent Labs    01/22/22 1739 01/23/22 0337 01/23/22 0635 01/24/22 0046 01/24/22 0749 01/24/22 1538  HGB 13.9 12.6  --  12.0  --   --   HCT 41.2 38.1  --  36.3  --   --   PLT 171 144*  --  143*  --   --   HEPARINUNFRC  --   --    < > 0.30 0.83* 0.64  CREATININE 0.77 0.69  --  0.74  --   --    < > = values in this interval not displayed.     Estimated Creatinine Clearance: 85.8 mL/min (by C-G formula based on SCr of 0.74 mg/dL).   Medical History: Past Medical History:  Diagnosis Date   Medical history non-contributory     Medications:  Medications Prior to Admission  Medication Sig Dispense Refill Last Dose   oxyCODONE (OXY IR/ROXICODONE) 5 MG immediate release tablet Take 5 mg by mouth every 4 (four) hours as needed for moderate pain.   01/21/2022   butalbital-acetaminophen-caffeine (FIORICET) 50-325-40 MG tablet Take 2 tablets by mouth every 4 (four) hours as needed for headache. (Patient not taking: Reported on 01/23/2022) 14 tablet 0 Not Taking   glycopyrrolate (ROBINUL) 1 MG tablet Take 1 tablet (1 mg total) by mouth 3 (three) times daily. (Patient not taking: Reported on 01/23/2022) 90 tablet 1 Not Taking   ondansetron (ZOFRAN-ODT) 8 MG disintegrating tablet Take 1 tablet (8 mg total) by mouth every 8 (eight) hours as needed for nausea or vomiting. (Patient not taking: Reported on 01/23/2022) 30 tablet 0 Not Taking   oseltamivir (TAMIFLU) 75 MG capsule Take 1 capsule (75 mg total) by mouth every 12 (twelve) hours. (Patient not taking: Reported on  01/23/2022) 10 capsule 0 Completed Course   Prenatal Vit-Fe Fumarate-FA (PRENATAL COMPLETE) 14-0.4 MG TABS Take 1 tablet by mouth daily. (Patient not taking: Reported on 01/23/2022) 60 tablet 0 Completed Course    Assessment: 80 YOF with acute DVT involving the R iliac and common femoral veins to start IV Heparin. H/H and Plt wnl. SCr wnl   HL is now therapeutic at 0.64 on 1000 units/hr. RN reports no s/s of bleeding   Goal of Therapy:  Heparin level 0.3-0.7 units/ml Monitor platelets by anticoagulation protocol: Yes   Plan:  Continue heparin gtt at 1000 units/hr F/u confirmatory HL in 6 hours  Daily heparin level, CBC, s/s bleeding F/u IR plans and ability to transition to PO  37, PharmD., BCCCP Clinical Pharmacist Please refer to [facility] for unit-specific pharmacist

## 2022-01-24 NOTE — Progress Notes (Signed)
Referring Physician(s): Dr. Darreld Mclean  Supervising Physician: Richarda Overlie   Patient Status:  [facility] - In-pt  Chief Complaint:  CT-guided left pelvic abscess drain catheter placement 01/23/22  Subjective:  Pt sitting upright in bed talking on cell phone. She endorses tenderness to insertion site, but denies pain. She states she is feeling better today. She denies chills, fever, N/V.   Allergies: Tape  Medications: Prior to Admission medications   Medication Sig Start Date End Date Taking? Authorizing Provider  oxyCODONE (OXY IR/ROXICODONE) 5 MG immediate release tablet Take 5 mg by mouth every 4 (four) hours as needed for moderate pain. 01/19/22  Yes [provider]  butalbital-acetaminophen-caffeine (FIORICET) 50-325-40 MG tablet Take 2 tablets by mouth every 4 (four) hours as needed for headache. Patient not taking: Reported on 01/23/2022 07/13/21   Marylene Land, CNM  glycopyrrolate (ROBINUL) 1 MG tablet Take 1 tablet (1 mg total) by mouth 3 (three) times daily. Patient not taking: Reported on 01/23/2022 07/13/21   Marylene Land, CNM  ondansetron (ZOFRAN-ODT) 8 MG disintegrating tablet Take 1 tablet (8 mg total) by mouth every 8 (eight) hours as needed for nausea or vomiting. Patient not taking: Reported on 01/23/2022 07/13/21   Marylene Land, CNM  oseltamivir (TAMIFLU) 75 MG capsule Take 1 capsule (75 mg total) by mouth every 12 (twelve) hours. Patient not taking: Reported on 01/23/2022 11/06/21   Merrilee Jansky, MD  Prenatal Vit-Fe Fumarate-FA (PRENATAL COMPLETE) 14-0.4 MG TABS Take 1 tablet by mouth daily. Patient not taking: Reported on 01/23/2022 03/31/21   Ivette Loyal, NP  fluticasone [facility]) 50 MCG/ACT nasal spray Place 1 spray into both nostrils daily. 01/02/21 04/25/21  Burky, Barron Alvine, NP  LO LOESTRIN FE 1 MG-10 MCG / 10 MCG tablet Take 1 tablet by mouth daily. 03/06/20 07/06/20  Brock Bad, MD  medroxyPROGESTERone  (DEPO-PROVERA) 150 MG/ML injection Inject 1 mL (150 mg total) into the muscle every 3 (three) months. Patient not taking: Reported on 11/14/2018 07/18/18 07/06/20  Brock Bad, MD     Vital Signs: BP 100/67 (BP Location: Left Arm)    Pulse 73    Temp 98.2 F (36.8 C) (Oral)    Resp 18    Ht 4\' 11"  (1.499 m)    Wt 146 lb (66.2 kg)    LMP 06/08/2021 (Approximate)    SpO2 100%    BMI 29.49 kg/m   Physical Exam Vitals reviewed.  Constitutional:      Appearance: She is well-developed.  HENT:     Head: Normocephalic and atraumatic.  Eyes:     Extraocular Movements: Extraocular movements intact.     Pupils: Pupils are equal, round, and reactive to light.  Cardiovascular:     Rate and Rhythm: Normal rate.  Pulmonary:     Effort: Pulmonary effort is normal. No respiratory distress.  Abdominal:     Comments: Insertion site unremarkable with sutures/statlock in place. Small amt of dried blood around site with no active bleeding, oozing or redness. Flushes/aspirates easily. ~10 cc dark red OP in JP drain w/ 13 cc OP documented. Dressing C/D/I. 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   Neurological:     Mental Status: She is alert.    Imaging: CT Angio Chest Pulmonary Embolism (PE) W or WO Contrast  Addendum Date: 01/23/2022   ADDENDUM REPORT: 01/23/2022 23:42 ADDENDUM: Critical findings were reported to NP Audrea Muscat at 11:41 p.m. Electronically Signed   By: Charlestine Night.D.  On: 01/23/2022 23:42   Result Date: 01/23/2022 CLINICAL DATA:  Pulmonary embolism suspected, positive D-dimer. Known bilateral DVTs. EXAM: CT ANGIOGRAPHY CHEST WITH CONTRAST TECHNIQUE: Multidetector CT imaging of the chest was performed using the standard protocol during bolus administration of intravenous contrast. Multiplanar CT image reconstructions and MIPs were obtained to evaluate the vascular anatomy. RADIATION DOSE REDUCTION: This exam was performed according to the departmental dose-optimization program which includes  automated exposure control, adjustment of the mA and/or kV according to patient size and/or use of iterative reconstruction technique. CONTRAST:  56mL OMNIPAQUE IOHEXOL 350 MG/ML SOLN COMPARISON:  01/22/2022. FINDINGS: Cardiovascular: The heart is normal in size and there is no pericardial effusion. The aorta and pulmonary trunk are normal in caliber. Filling defects are noted in the left pulmonary artery extending into the left upper and lower lobe lobar and segmental arteries. Segmental and subsegmental pulmonary artery filling defects are present in the right lower lobe. No evidence of right heart strain. Mediastinum/Nodes: No enlarged mediastinal, hilar, or axillary lymph nodes. Thyroid gland, trachea, and esophagus demonstrate no significant findings. Lungs/Pleura: Lungs are clear. No pleural effusion or pneumothorax. Upper Abdomen: No acute abnormality. Musculoskeletal: No chest wall abnormality. No acute or significant osseous findings. Review of the MIP images confirms the above findings. IMPRESSION: Bilateral pulmonary emboli, moderate thrombus burden. No evidence of right heart strain. Electronically Signed: By: Thornell Sartorius M.D. On: 01/23/2022 23:37   CT ABDOMEN PELVIS W CONTRAST  Addendum Date: 01/23/2022   ADDENDUM REPORT: 01/23/2022 20:31 ADDENDUM: Critical findings for addendum were reported to nurse practitioner Audrea Muscat at 8:29 p.m. Electronically Signed   By: Thornell Sartorius M.D.   On: 01/23/2022 20:31   Addendum Date: 01/23/2022   ADDENDUM REPORT: 01/23/2022 20:12 ADDENDUM: Upon re-review of CT abdomen and pelvis with contrast performed 02/01/2022, the following findings are noted: A suspected filling defect is present in a right lower lobe segmental pulmonary artery, concerning for pulmonary embolism. CTA chest is recommended for further evaluation. Electronically Signed   By: Thornell Sartorius M.D.   On: 01/23/2022 20:12   Result Date: 01/23/2022 CLINICAL DATA:  Abdominal pain following  surgery in [place]. EXAM: CT ABDOMEN AND PELVIS WITH CONTRAST TECHNIQUE: Multidetector CT imaging of the abdomen and pelvis was performed using the standard protocol following bolus administration of intravenous contrast. RADIATION DOSE REDUCTION: This exam was performed according to the departmental dose-optimization program which includes automated exposure control, adjustment of the mA and/or kV according to patient size and/or use of iterative reconstruction technique. CONTRAST:  OMNIPAQUE IOHEXOL 300 MG/ML  SOLN COMPARISON:  None. FINDINGS: Lower chest: No acute abnormality. Hepatobiliary: Hypodense region is seen in the left lobe of the liver adjacent to the falciform ligament, likely representing focal fatty infiltration. There is an ill-defined hypodense region in the anterior right lobe of the liver at the gallbladder fossa which can not be further characterized on this single-phase exam. The gallbladder is without stones. No biliary ductal dilatation. Pancreas: Unremarkable. No pancreatic ductal dilatation or surrounding inflammatory changes. Spleen: Normal in size without focal abnormality. Adrenals/Urinary Tract: The adrenal glands are within normal limits. A nonobstructive calculus is noted in the lower pole the left kidney. No obstructive uropathy bilaterally. There is bladder wall thickening with surrounding fat stranding which may be related to local inflammatory changes. Stomach/Bowel: The stomach is within normal limits. No bowel obstruction, free air, or pneumatosis. The patient is status post presumed right hemicolectomy with a right lower quadrant ostomy. A few scattered diverticula  are noted in the remaining colon. There is mild small bowel wall thickening in the right lower quadrant which is likely related to local inflammatory changes. Vascular/Lymphatic: A filling defect is present in the common and external iliac veins on the left and common femoral vein on the right., compatible with  deep venous thrombosis. The aorta is normal in caliber. No definite abdominal or pelvic lymphadenopathy by size criteria. Reproductive: Not well seen due to inflammatory changes and lack of enteric contrast. Per PA Delsa Sale, status post hysterectomy. Other: A rim enhancing collection is noted in the pelvis in the left lower quadrant measuring 7.1 x 6.0 x 4.9 cm, concerning for abscess. There is a linear rim enhancing hypodense collection in the pelvis in the region of prior surgery best seen on coronal image 47. Fat stranding and a small amount of free fluid are noted in the pelvis. Musculoskeletal: No acute osseous abnormality. IMPRESSION: 1. Findings compatible with deep venous thrombosis involving the common and external iliac veins on the left and common femoral vein on the right. 2. Status post surgery, possible hysterectomy per PA [facility]. There is a rim enhancing collection containing air in the pelvis on the left measuring 7.1 x 6.0 x 4.9 cm, likely abscess. There is a linear hypodense collection with rim enhancement and air in the lower abdomen measuring 7.6 x 0.4 x 0.8 cm, possible abscess. Surgical consultation is recommended. 3. Bladder wall thickening with surrounding fat stranding, possible infectious or inflammatory cystitis. 4. Small hypodense collection in the low anterior abdominal wall measuring 1.2 x 1.1 x 1.0 cm in the region of prior surgery, possible phlegmon versus small abscess. 5. Status post right hemicolectomy with right lower quadrant ostomy. No bowel obstruction or free air. There is mild thickening of a few loops of small bowel in the pelvis on the right which is likely related to local inflammatory changes. 6. Left renal calculus. 7. Remaining findings as described above. Critical findings were reported to PA Soto at 9:39 p.m. Electronically Signed: By: Thornell Sartorius M.D. On: 01/22/2022 21:54   CT IMAGE GUIDED DRAINAGE BY PERCUTANEOUS CATHETER  Result Date:  01/23/2022 CLINICAL DATA:  Post hysterectomy and partial bowel resection at outside institution. Complex 7.1 cm left pelvic collection. Abdominal pain. EXAM: CT GUIDED DRAINAGE OF PELVIC ABSCESS ANESTHESIA/SEDATION: Lidocaine 1% subcutaneous. Moderate sedation was not utilized because of hypotension. PROCEDURE: The procedure, risks, benefits, and alternatives were explained to the patient. Questions regarding the procedure were encouraged and answered. The patient understands and consents to the procedure. Select axial scans through the pelvis were obtained. The complex left extraperitoneal collection was localized and an appropriate skin entry site was determined and marked. The operative field was prepped with chlorhexidinein a sterile fashion, and a sterile drape was applied covering the operative field. A sterile gown and sterile gloves were used for the procedure. Local anesthesia was provided with 1% Lidocaine. Under CT fluoroscopic guidance, a 19 gauge percutaneous entry needle advanced into the collection. Bloody fluid could be aspirated. Amplatz guidewire advanced easily within the collection, position confirmed on CT. Tract dilated to facilitate placement of 12 French pigtail drain catheter, placed centrally within the collection. A relatively small volume of bloody fluid could be aspirated. Given concerns this could represent infected hematoma, the catheter was left in place, secured externally with 0 Prolene suture and StatLock, and placed to suction bulb drainage. The aspirate was sent for Gram stain and culture. The patient tolerated the procedure well. COMPLICATIONS: None immediate  FINDINGS: Complex left extraperitoneal fluid collection localized. 12 French pigtail drain catheter placed as above. Small volume bloody aspirate sent for Gram stain and culture. IMPRESSION: 1. Technically successful CT-guided left pelvic abscess drain catheter placement. RADIATION DOSE REDUCTION: This exam was performed  according to the departmental dose-optimization program which includes automated exposure control, adjustment of the mA and/or kV according to patient size and/or use of iterative reconstruction technique. Electronically Signed   By: Corlis Leak  Hassell M.D.   On: 01/23/2022 16:18    Labs:  CBC: Recent Labs    04/15/21 1102 01/22/22 1739 01/23/22 0337 01/24/22 0046  WBC 4.9 5.3 4.5 4.9  HGB 12.8 13.9 12.6 12.0  HCT 39.1 41.2 38.1 36.3  PLT 269 171 144* 143*    COAGS: No results for input(s): INR, APTT in the last 8760 hours.  BMP: Recent Labs    04/15/21 1102 01/22/22 1739 01/23/22 0337 01/24/22 0046  NA 137 135 137 135  K 3.6 4.0 3.7 3.7  CL 106 102 107 106  CO2 26 24 21* 20*  GLUCOSE 87 96 93 89  BUN 9 9 8  5*  CALCIUM 9.3 9.4 9.1 8.8*  CREATININE 0.48 0.77 0.69 0.74  GFRNONAA >60 >60 >60 >60    LIVER FUNCTION TESTS: Recent Labs    04/15/21 1102 01/22/22 1739 01/23/22 0337  BILITOT 0.5 0.4 0.5  AST 21 19 16   ALT 20 17 19   ALKPHOS 63 158* 142*  PROT 7.3 8.1 7.1  ALBUMIN 4.1 3.9 3.5    Assessment and Plan:   CT-guided left pelvic abscess drain catheter placement 01/23/22.  Pt sitting upright in bed talking on cell phone. She endorses tenderness to insertion site, but denies pain. She states she is feeling better today. She denies chills, fever, N/V.    Insertion site unremarkable with sutures/statlock in place. Small amt of dried blood    around site with no active bleeding, oozing or redness. Flushes/aspirates easily. ~10cc  dark red OP in JP drain w/ 13 cc OP documented. Dressing C/D/I.  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     WBC 4.9, afebrile.   VSS   Continue documenting OP in Epic q shift.  Continue flushing TID.  Change dressing q shift or as needed.   Please call IR with questions or concerns.    Electronically Signed: Shon HoughSusan T Lynnita Somma, NP 01/24/2022, 1:00 PM   I spent a total of 15 Minutes at the the patient's bedside AND on the patient's hospital floor or  unit, greater than 50% of which was counseling/coordinating care for left pelvic abscess drain placement.

## 2022-01-24 NOTE — Progress Notes (Signed)
PROGRESS NOTE        PATIENT DETAILS Name: Maria Riley Age: 31 y.o. Sex: female Date of Birth: 10-24-1991 Admit Date: 01/22/2022 Admitting Physician Lenore Cordia, MD BP:[id number], No Pcp Per (Inactive)  Brief Summary: Maria Riley is a 31 y.o. female who recently had a emergent C-section in January 2023 in [place] resulting in postoperative complications that resulted in right hemicolectomy with ostomy, hysterectomy -presented with several weeks of abdominal pain found to have pelvic abscess and acute DVT involving her lower extremities.  See below for further details.  Significant Hospital events: 2/16>> admit to [facility] for pelvic abscess and bilateral lower extremity DVT.  Significant imaging studies: 2/16>> CT abdomen/pelvis: Multiple pelvic/abdominal abscesses.  DVT involving common/external iliac veins in the left and common femoral vein on the right.  Significant microbiology data: 2/16>> COVID/influenza PCR: Negative 2/16>> urine culture: In process. 2/17>> blood culture: In process.  Procedures: None  Consults:  [facility] IR  Subjective: Patient was seen and examined at bedside.  There were no acute events overnight.  She reports mild discomfort in her lower abdomen.-Small amount of bleeding noted around the IR drain.  Objective: Vitals: Blood pressure 100/67, pulse 73, temperature 98.2 F (36.8 C), temperature source Oral, resp. rate 18, height 4\' 11"  (1.499 m), weight 66.2 kg, last menstrual period 06/08/2021, SpO2 100 %, unknown if currently breastfeeding.   Exam: Gen Exam: Well-developed well-nourished in no acute respirations alert oriented x3. HEENT: Atraumatic, normocephalic. Chest: Clear to auscultation no wheezes or rales CVS: Regular rate and rhythm no rubs or gallops. Abdomen: Soft ostomy in place.  Mild bleeding around the IR drain. Extremities: No lower extremity edema bilaterally. Neurology: Nonfocal exam. Skin: No rashes or  ulcerative lesions noted  Pertinent Labs/Radiology: CBC Latest Ref Rng & Units 01/24/2022 01/23/2022 01/22/2022  WBC 4.0 - 10.5 K/uL 4.9 4.5 5.3  Hemoglobin 12.0 - 15.0 g/dL 12.0 12.6 13.9  Hematocrit 36.0 - 46.0 % 36.3 38.1 41.2  Platelets 150 - 400 K/uL 143(L) 144(L) 171    Lab Results  Component Value Date   NA 135 01/24/2022   K 3.7 01/24/2022   CL 106 01/24/2022   CO2 20 (L) 01/24/2022      Assessment/Plan: Pelvic abscess: Likely a sequelae of her recent C-section/hemicolectomy-continue IV antibiotics-IR consulted for drain placement.  General surgery following.  Follow culture for ID and sensitivities.  Acute bilateral lower extremity DVT: On IV heparin-currently on hold for IR procedure-will need to be resumed post IR procedure.  Continue IV heparin.  Recent pregnancy resulting in emergent C-section-Per patient-this was due to numerous placental issues-claims that the baby did not survive.  She is not breast-feeding.  Mild non-anion gap metabolic acidosis Serum bicarb 20 Anion gap 9 Resolved gentle IV fluid hydration, LR 50 cc for 2 days.  Mild thrombocytopenia, secondary to acute illness Monitor for now Platelet count 143 from 144  BMI: Estimated body mass index is 29.49 kg/m as calculated from the following:   Height as of this encounter: 4\' 11"  (1.499 m).   Weight as of this encounter: 66.2 kg.   Code status:   Code Status: Full Code   DVT Prophylaxis: IV heparin  Family Communication: None at bedside   Disposition Plan: Status is: Inpatient Remains inpatient appropriate because: Multiple pelvic abscesses-awaiting drain placement by IR-on IV antibiotics.   Planned Discharge  Destination:Home when off IV heparin and IR has signed off.   Diet: Diet Order             Diet full liquid Room service appropriate? Yes; Fluid consistency: Thin  Diet effective now                     Antimicrobial agents: Anti-infectives (From admission, onward)     Start     Dose/Rate Route Frequency Ordered Stop   01/23/22 0800  piperacillin-tazobactam (ZOSYN) IVPB 3.375 g        3.375 g 12.5 mL/hr over 240 Minutes Intravenous Every 8 hours 01/23/22 0054     01/23/22 0100  piperacillin-tazobactam (ZOSYN) IVPB 3.375 g        3.375 g 100 mL/hr over 30 Minutes Intravenous  Once 01/23/22 0054 01/23/22 0204        MEDICATIONS: Scheduled Meds:  sodium chloride flush  5 mL Intracatheter Q8H   Continuous Infusions:  heparin 1,000 Units/hr (01/24/22 0947)   piperacillin-tazobactam (ZOSYN)  IV 3.375 g (01/23/22 2343)   PRN Meds:.acetaminophen **OR** acetaminophen, HYDROcodone-acetaminophen, morphine injection, ondansetron **OR** ondansetron (ZOFRAN) IV   I have personally reviewed following labs and imaging studies  LABORATORY DATA: CBC: Recent Labs  Lab 01/22/22 1739 01/23/22 0337 01/24/22 0046  WBC 5.3 4.5 4.9  NEUTROABS 3.1  --   --   HGB 13.9 12.6 12.0  HCT 41.2 38.1 36.3  MCV 89.0 89.4 90.3  PLT 171 144* 143*    Basic Metabolic Panel: Recent Labs  Lab 01/22/22 1739 01/23/22 0337 01/24/22 0046  NA 135 137 135  K 4.0 3.7 3.7  CL 102 107 106  CO2 24 21* 20*  GLUCOSE 96 93 89  BUN 9 8 5*  CREATININE 0.77 0.69 0.74  CALCIUM 9.4 9.1 8.8*    GFR: Estimated Creatinine Clearance: 85.8 mL/min (by C-G formula based on SCr of 0.74 mg/dL).  Liver Function Tests: Recent Labs  Lab 01/22/22 1739 01/23/22 0337  AST 19 16  ALT 17 19  ALKPHOS 158* 142*  BILITOT 0.4 0.5  PROT 8.1 7.1  ALBUMIN 3.9 3.5   Recent Labs  Lab 01/22/22 1739  LIPASE 43   No results for input(s): AMMONIA in the last 168 hours.  Coagulation Profile: No results for input(s): INR, PROTIME in the last 168 hours.  Cardiac Enzymes: No results for input(s): CKTOTAL, CKMB, CKMBINDEX, TROPONINI in the last 168 hours.  BNP (last 3 results) No results for input(s): PROBNP in the last 8760 hours.  Lipid Profile: No results for input(s): CHOL, HDL,  LDLCALC, TRIG, CHOLHDL, LDLDIRECT in the last 72 hours.  Thyroid Function Tests: No results for input(s): TSH, T4TOTAL, FREET4, T3FREE, THYROIDAB in the last 72 hours.  Anemia Panel: No results for input(s): VITAMINB12, FOLATE, FERRITIN, TIBC, IRON, RETICCTPCT in the last 72 hours.  Urine analysis:    Component Value Date/Time   COLORURINE AMBER (A) 01/22/2022 1647   APPEARANCEUR HAZY (A) 01/22/2022 1647   LABSPEC 1.015 01/22/2022 1647   PHURINE 6.0 01/22/2022 1647   GLUCOSEU NEGATIVE 01/22/2022 1647   HGBUR MODERATE (A) 01/22/2022 1647   BILIRUBINUR NEGATIVE 01/22/2022 1647   KETONESUR NEGATIVE 01/22/2022 1647   PROTEINUR 30 (A) 01/22/2022 1647   UROBILINOGEN 1.0 09/04/2021 1157   NITRITE POSITIVE (A) 01/22/2022 1647   LEUKOCYTESUR MODERATE (A) 01/22/2022 1647    Sepsis Labs: Lactic Acid, Venous No results found for: LATICACIDVEN  MICROBIOLOGY: Recent Results (from the past 240 hour(s))  Urine  Culture     Status: Abnormal   Collection Time: 01/22/22  6:16 PM   Specimen: Urine, Clean Catch  Result Value Ref Range Status   Specimen Description URINE, CLEAN CATCH  Final   Special Requests   Final    NONE Performed at [facility], [address] [place], [place] [address]    Culture MULTIPLE SPECIES PRESENT, SUGGEST RECOLLECTION (A)  Final   Report Status 01/24/2022 FINAL  Final  Resp Panel by RT-PCR (Flu A&B, Covid) Nasopharyngeal Swab     Status: None   Collection Time: 01/22/22 10:35 PM   Specimen: Nasopharyngeal Swab; Nasopharyngeal(NP) swabs in vial transport medium  Result Value Ref Range Status   SARS Coronavirus 2 by RT PCR NEGATIVE NEGATIVE Final    Comment: (NOTE) SARS-CoV-2 target nucleic acids are NOT DETECTED.  The SARS-CoV-2 RNA is generally detectable in upper respiratory specimens during the acute phase of infection. The lowest concentration of SARS-CoV-2 viral copies this assay can detect is 138 copies/mL. A negative result does not preclude  SARS-Cov-2 infection and should not be used as the sole basis for treatment or other patient management decisions. A negative result may occur with  improper specimen collection/handling, submission of specimen other than nasopharyngeal swab, presence of viral mutation(s) within the areas targeted by this assay, and inadequate number of viral copies(<138 copies/mL). A negative result must be combined with clinical observations, patient history, and epidemiological information. The expected result is Negative.  Fact Sheet for Patients:  EntrepreneurPulse.com.au  Fact Sheet for Healthcare Providers:  IncredibleEmployment.be  This test is no t yet approved or cleared by the [place] FDA and  has been authorized for detection and/or diagnosis of SARS-CoV-2 by FDA under an Emergency Use Authorization (EUA). This EUA will remain  in effect (meaning this test can be used) for the duration of the COVID-19 declaration under Section 564(b)(1) of the Act, 21 U.S.C.section 360bbb-3(b)(1), unless the authorization is terminated  or revoked sooner.       Influenza A by PCR NEGATIVE NEGATIVE Final   Influenza B by PCR NEGATIVE NEGATIVE Final    Comment: (NOTE) The Xpert Xpress SARS-CoV-2/FLU/RSV plus assay is intended as an aid in the diagnosis of influenza from Nasopharyngeal swab specimens and should not be used as a sole basis for treatment. Nasal washings and aspirates are unacceptable for Xpert Xpress SARS-CoV-2/FLU/RSV testing.  Fact Sheet for Patients: EntrepreneurPulse.com.au  Fact Sheet for Healthcare Providers: IncredibleEmployment.be  This test is not yet approved or cleared by the [place] FDA and has been authorized for detection and/or diagnosis of SARS-CoV-2 by FDA under an Emergency Use Authorization (EUA). This EUA will remain in effect (meaning this test can be used) for the duration of  the COVID-19 declaration under Section 564(b)(1) of the Act, 21 U.S.C. section 360bbb-3(b)(1), unless the authorization is terminated or revoked.  Performed at [facility], [place] [address], [place], [place] [address]   Culture, blood (routine x 2)     Status: None (Preliminary result)   Collection Time: 01/23/22 12:15 AM   Specimen: BLOOD  Result Value Ref Range Status   Specimen Description BLOOD SITE NOT SPECIFIED  Final   Special Requests   Final    BOTTLES DRAWN AEROBIC AND ANAEROBIC Blood Culture adequate volume   Culture   Final    NO GROWTH 1 DAY Performed at [facility], [place] [address], [place], [place] [address]    Report Status PENDING  Incomplete  Culture, blood (routine x 2)     Status: None (Preliminary result)   Collection Time: 01/23/22 12:26 AM   Specimen: BLOOD  Result Value Ref Range Status   Specimen Description BLOOD SITE NOT SPECIFIED  Final   Special Requests   Final    BOTTLES DRAWN AEROBIC AND ANAEROBIC Blood Culture adequate volume   Culture   Final    NO GROWTH 1 DAY Performed at [facility], [address], [place], [place] [address]    Report Status PENDING  Incomplete  Aerobic/Anaerobic Culture w Gram Stain (surgical/deep wound)     Status: None (Preliminary result)   Collection Time: 01/23/22  3:40 PM   Specimen: Abscess  Result Value Ref Range Status   Specimen Description ABSCESS  Final   Special Requests NONE  Final   Gram Stain PENDING  Incomplete   Culture   Final    NO GROWTH < 12 HOURS Performed at [facility], [place] [address], [place],  [address]    Report Status PENDING  Incomplete    RADIOLOGY STUDIES/RESULTS: CT Angio Chest Pulmonary Embolism (PE) W or WO Contrast  Addendum Date: 01/23/2022   ADDENDUM REPORT: 01/23/2022 23:42 ADDENDUM: Critical findings were reported to NP Lovey Newcomer at 11:41 p.m. Electronically Signed   By: Brett Fairy M.D.   On: 01/23/2022 23:42   Result Date:  01/23/2022 CLINICAL DATA:  Pulmonary embolism suspected, positive D-dimer. Known bilateral DVTs. EXAM: CT ANGIOGRAPHY CHEST WITH CONTRAST TECHNIQUE: Multidetector CT imaging of the chest was performed using the standard protocol during bolus administration of intravenous contrast. Multiplanar CT image reconstructions and MIPs were obtained to evaluate the vascular anatomy. RADIATION DOSE REDUCTION: This exam was performed according to the departmental dose-optimization program which includes automated exposure control, adjustment of the mA and/or kV according to patient size and/or use of iterative reconstruction technique. CONTRAST:  57mL OMNIPAQUE IOHEXOL 350 MG/ML SOLN COMPARISON:  01/22/2022. FINDINGS: Cardiovascular: The heart is normal in size and there is no pericardial effusion. The aorta and pulmonary trunk are normal in caliber. Filling defects are noted in the left pulmonary artery extending into the left upper and lower lobe lobar and segmental arteries. Segmental and subsegmental pulmonary artery filling defects are present in the right lower lobe. No evidence of right heart strain. Mediastinum/Nodes: No enlarged mediastinal, hilar, or axillary lymph nodes. Thyroid gland, trachea, and esophagus demonstrate no significant findings. Lungs/Pleura: Lungs are clear. No pleural effusion or pneumothorax. Upper Abdomen: No acute abnormality. Musculoskeletal: No chest wall abnormality. No acute or significant osseous findings. Review of the MIP images confirms the above findings. IMPRESSION: Bilateral pulmonary emboli, moderate thrombus burden. No evidence of right heart strain. Electronically Signed: By: Brett Fairy M.D. On: 01/23/2022 23:37   CT ABDOMEN PELVIS W CONTRAST  Addendum Date: 01/23/2022   ADDENDUM REPORT: 01/23/2022 20:31 ADDENDUM: Critical findings for addendum were reported to nurse practitioner Lovey Newcomer at 8:29 p.m. Electronically Signed   By: Brett Fairy M.D.   On: 01/23/2022 20:31    Addendum Date: 01/23/2022   ADDENDUM REPORT: 01/23/2022 20:12 ADDENDUM: Upon re-review of CT abdomen and pelvis with contrast performed 02/01/2022, the following findings are noted: A suspected filling defect is present in a right lower lobe segmental pulmonary artery, concerning for pulmonary embolism. CTA chest is recommended for further evaluation. Electronically Signed   By: Brett Fairy M.D.   On: 01/23/2022 20:12   Result Date: 01/23/2022 CLINICAL DATA:  Abdominal pain following surgery in  [place]. EXAM: CT ABDOMEN AND PELVIS WITH CONTRAST TECHNIQUE: Multidetector CT imaging of the abdomen and pelvis was performed using the standard protocol following bolus administration of intravenous contrast. RADIATION DOSE REDUCTION: This exam was performed according to the departmental dose-optimization program which includes automated exposure control, adjustment of the mA and/or kV according to patient size and/or use of iterative reconstruction technique. CONTRAST:  120mL OMNIPAQUE IOHEXOL 300 MG/ML  SOLN COMPARISON:  None. FINDINGS: Lower chest: No acute abnormality. Hepatobiliary: Hypodense region is seen in the left lobe of the liver adjacent to the falciform ligament, likely representing focal fatty infiltration. There is an ill-defined hypodense region in the anterior right lobe of the liver at the gallbladder fossa which can not be further characterized on this single-phase exam. The gallbladder is without stones. No biliary ductal dilatation. Pancreas: Unremarkable. No pancreatic ductal dilatation or surrounding inflammatory changes. Spleen: Normal in size without focal abnormality. Adrenals/Urinary Tract: The adrenal glands are within normal limits. A nonobstructive calculus is noted in the lower pole the left kidney. No obstructive uropathy bilaterally. There is bladder wall thickening with surrounding fat stranding which may be related to local inflammatory changes. Stomach/Bowel: The stomach is within  normal limits. No bowel obstruction, free air, or pneumatosis. The patient is status post presumed right hemicolectomy with a right lower quadrant ostomy. A few scattered diverticula are noted in the remaining colon. There is mild small bowel wall thickening in the right lower quadrant which is likely related to local inflammatory changes. Vascular/Lymphatic: A filling defect is present in the common and external iliac veins on the left and common femoral vein on the right., compatible with deep venous thrombosis. The aorta is normal in caliber. No definite abdominal or pelvic lymphadenopathy by size criteria. Reproductive: Not well seen due to inflammatory changes and lack of enteric contrast. Per PA Hanley Seamen, status post hysterectomy. Other: A rim enhancing collection is noted in the pelvis in the left lower quadrant measuring 7.1 x 6.0 x 4.9 cm, concerning for abscess. There is a linear rim enhancing hypodense collection in the pelvis in the region of prior surgery best seen on coronal image 47. Fat stranding and a small amount of free fluid are noted in the pelvis. Musculoskeletal: No acute osseous abnormality. IMPRESSION: 1. Findings compatible with deep venous thrombosis involving the common and external iliac veins on the left and common femoral vein on the right. 2. Status post surgery, possible hysterectomy per PA [facility]. There is a rim enhancing collection containing air in the pelvis on the left measuring 7.1 x 6.0 x 4.9 cm, likely abscess. There is a linear hypodense collection with rim enhancement and air in the lower abdomen measuring 7.6 x 0.4 x 0.8 cm, possible abscess. Surgical consultation is recommended. 3. Bladder wall thickening with surrounding fat stranding, possible infectious or inflammatory cystitis. 4. Small hypodense collection in the low anterior abdominal wall measuring 1.2 x 1.1 x 1.0 cm in the region of prior surgery, possible phlegmon versus small abscess. 5. Status post  right hemicolectomy with right lower quadrant ostomy. No bowel obstruction or free air. There is mild thickening of a few loops of small bowel in the pelvis on the right which is likely related to local inflammatory changes. 6. Left renal calculus. 7. Remaining findings as described above. Critical findings were reported to PA Soto at 9:39 p.m. Electronically Signed: By: Brett Fairy M.D. On: 01/22/2022 21:54   CT IMAGE GUIDED DRAINAGE BY PERCUTANEOUS CATHETER  Result Date: 01/23/2022  CLINICAL DATA:  Post hysterectomy and partial bowel resection at outside institution. Complex 7.1 cm left pelvic collection. Abdominal pain. EXAM: CT GUIDED DRAINAGE OF PELVIC ABSCESS ANESTHESIA/SEDATION: Lidocaine 1% subcutaneous. Moderate sedation was not utilized because of hypotension. PROCEDURE: The procedure, risks, benefits, and alternatives were explained to the patient. Questions regarding the procedure were encouraged and answered. The patient understands and consents to the procedure. Select axial scans through the pelvis were obtained. The complex left extraperitoneal collection was localized and an appropriate skin entry site was determined and marked. The operative field was prepped with chlorhexidinein a sterile fashion, and a sterile drape was applied covering the operative field. A sterile gown and sterile gloves were used for the procedure. Local anesthesia was provided with 1% Lidocaine. Under CT fluoroscopic guidance, a 19 gauge percutaneous entry needle advanced into the collection. Bloody fluid could be aspirated. Amplatz guidewire advanced easily within the collection, position confirmed on CT. Tract dilated to facilitate placement of 12 French pigtail drain catheter, placed centrally within the collection. A relatively small volume of bloody fluid could be aspirated. Given concerns this could represent infected hematoma, the catheter was left in place, secured externally with 0 Prolene suture and StatLock,  and placed to suction bulb drainage. The aspirate was sent for Gram stain and culture. The patient tolerated the procedure well. COMPLICATIONS: None immediate FINDINGS: Complex left extraperitoneal fluid collection localized. 12 French pigtail drain catheter placed as above. Small volume bloody aspirate sent for Gram stain and culture. IMPRESSION: 1. Technically successful CT-guided left pelvic abscess drain catheter placement. RADIATION DOSE REDUCTION: This exam was performed according to the departmental dose-optimization program which includes automated exposure control, adjustment of the mA and/or kV according to patient size and/or use of iterative reconstruction technique. Electronically Signed   By: Lucrezia Europe M.D.   On: 01/23/2022 16:18     LOS: 2 days   Kayleen Memos, MD  [facility]    To contact the attending provider between 7A-7P or the covering provider during after hours 7P-7A, please log into the web site [email or web address] and access using universal [place] password for that web site. If you do not have the password, please call the hospital operator.  01/24/2022, 11:42 AM

## 2022-01-25 DIAGNOSIS — N739 Female pelvic inflammatory disease, unspecified: Secondary | ICD-10-CM | POA: Diagnosis not present

## 2022-01-25 LAB — CBC
HCT: 35.1 % — ABNORMAL LOW (ref 36.0–46.0)
Hemoglobin: 11.8 g/dL — ABNORMAL LOW (ref 12.0–15.0)
MCH: 30 pg (ref 26.0–34.0)
MCHC: 33.6 g/dL (ref 30.0–36.0)
MCV: 89.3 fL (ref 80.0–100.0)
Platelets: 172 10*3/uL (ref 150–400)
RBC: 3.93 MIL/uL (ref 3.87–5.11)
RDW: 14.7 % (ref 11.5–15.5)
WBC: 4.4 10*3/uL (ref 4.0–10.5)
nRBC: 0 % (ref 0.0–0.2)

## 2022-01-25 LAB — HEPARIN LEVEL (UNFRACTIONATED): Heparin Unfractionated: 0.61 IU/mL (ref 0.30–0.70)

## 2022-01-25 NOTE — Progress Notes (Signed)
PROGRESS NOTE        PATIENT DETAILS Name: Maria Riley Age: 31 y.o. Sex: female Date of Birth: 12-14-1990 Admit Date: 01/22/2022 Admitting Physician Lenore Cordia, MD QP:[id number], No Pcp Per (Inactive)  Brief Summary: Maria Riley is a 31 y.o. female who recently had a emergent C-section in January 2023 in [place] resulting in postoperative complications that resulted in right hemicolectomy with ostomy, hysterectomy, she has 3 children, a son 30 years old, 2 daughters 94 and 66 years old.-presented with several weeks of abdominal pain found to have pelvic abscess and acute DVT involving her lower extremities, seen on CT AP w contrast 2/16 .  See below for further details.  Significant Hospital events: 2/16>> admit to [facility] for pelvic abscess and bilateral lower extremity DVT.  Significant imaging studies: 2/16>> CT abdomen/pelvis: Multiple pelvic/abdominal abscesses.  DVT involving common/external iliac veins in the left and common femoral vein on the right.  Significant microbiology data: 2/16>> COVID/influenza PCR: Negative 2/16>> urine culture: Suggest recollection. 2/17>> blood culture: Negative to date. 01/23/2022: Abscess culture reincubated for better growth.  Procedures:  CT L pelvic abscess drain placement by Dr. Vernard Gambles on 2/17.  Consults:  [facility] IR  Subjective: Patient was seen and examined at bedside.  There were no acute events overnight.  She reports mild discomfort in her lower abdomen.-Small amount of bleeding noted around the IR drain.  Objective: Vitals: Blood pressure 90/65, pulse 68, temperature 97.6 F (36.4 C), temperature source Oral, resp. rate 18, height 4\' 11"  (1.499 m), weight 66.2 kg, last menstrual period 06/08/2021, SpO2 97 %, unknown if currently breastfeeding.   Exam: Gen Exam: Well-developed well-nourished in no acute distress.  She is alert and oriented x3.   HEENT: Atraumatic, normocephalic. Chest: Clear to  auscultation no wheezes or rales. CVS: Regular rate and rhythm no rubs or gallops. Abdomen: Soft ostomy in place.  Bowel sounds present. Extremities: No lower extremity edema bilaterally. Neurology: Nonfocal exam. Skin: No rashes or ulcerative lesions noted.  Pertinent Labs/Radiology: CBC Latest Ref Rng & Units 01/25/2022 01/24/2022 01/23/2022  WBC 4.0 - 10.5 K/uL 4.4 4.9 4.5  Hemoglobin 12.0 - 15.0 g/dL 11.8(L) 12.0 12.6  Hematocrit 36.0 - 46.0 % 35.1(L) 36.3 38.1  Platelets 150 - 400 K/uL 172 143(L) 144(L)    Lab Results  Component Value Date   NA 135 01/24/2022   K 3.7 01/24/2022   CL 106 01/24/2022   CO2 20 (L) 01/24/2022      Assessment/Plan: Pelvic abscess: Likely a sequelae of her recent C-section/hemicolectomy-continue IV antibiotics-post drain placement by IR on 01/23/2022. General surgery following.   Abscess culture reintubated for better growth.   35 cc recorded in the last 24 hours.   If no additional surgery planned, will switch heparin drip to Eliquis to cover bilateral lower extremity DVT.  Continue to follow cultures for ID and sensitivities.  Acute bilateral lower extremity DVT seen on CT scan done on 01/22/2022: Currently on heparin drip, will switch to Eliquis on 01/26/2022 if no additional surgery planned.  Recent pregnancy resulting in emergent C-section-Per patient-this was due to numerous placental issues-claims that the baby did not survive.  She is not breast-feeding. She has 3 children, a son 23 years old, 2 daughters 35 and 30 years old  Mild non-anion gap metabolic acidosis Serum bicarb 20 Anion gap 9 Continue for  now gentle IV fluid hydration, LR 50 cc for 2 days. Repeat BMP in the morning.  Resolved mild thrombocytopenia, secondary to acute illness Platelet count has normalized 172.  BMI: Estimated body mass index is 29.49 kg/m as calculated from the following:   Height as of this encounter: 4\' 11"  (1.499 m).   Weight as of this encounter: 66.2  kg.   Code status:   Code Status: Full Code   DVT Prophylaxis: IV heparin  Family Communication: None at bedside   Disposition Plan: Status is: Inpatient Remains inpatient appropriate because: Multiple pelvic abscesses-awaiting drain placement by IR-on IV antibiotics.   Planned Discharge Destination:Home when off IV heparin and IR has signed off.   Diet: Diet Order             Diet full liquid Room service appropriate? Yes; Fluid consistency: Thin  Diet effective now                     Antimicrobial agents: Anti-infectives (From admission, onward)    Start     Dose/Rate Route Frequency Ordered Stop   01/23/22 0800  piperacillin-tazobactam (ZOSYN) IVPB 3.375 g        3.375 g 12.5 mL/hr over 240 Minutes Intravenous Every 8 hours 01/23/22 0054     01/23/22 0100  piperacillin-tazobactam (ZOSYN) IVPB 3.375 g        3.375 g 100 mL/hr over 30 Minutes Intravenous  Once 01/23/22 0054 01/23/22 0204        MEDICATIONS: Scheduled Meds:  sodium chloride flush  5 mL Intracatheter Q8H   Continuous Infusions:  heparin 1,000 Units/hr (01/24/22 2047)   lactated ringers 50 mL/hr at 01/24/22 2254   piperacillin-tazobactam (ZOSYN)  IV 3.375 g (01/24/22 2256)   PRN Meds:.acetaminophen **OR** acetaminophen, HYDROcodone-acetaminophen, morphine injection, ondansetron **OR** ondansetron (ZOFRAN) IV   I have personally reviewed following labs and imaging studies  LABORATORY DATA: CBC: Recent Labs  Lab 01/22/22 1739 01/23/22 0337 01/24/22 0046 01/25/22 0051  WBC 5.3 4.5 4.9 4.4  NEUTROABS 3.1  --   --   --   HGB 13.9 12.6 12.0 11.8*  HCT 41.2 38.1 36.3 35.1*  MCV 89.0 89.4 90.3 89.3  PLT 171 144* 143* [id number]    Basic Metabolic Panel: Recent Labs  Lab 01/22/22 1739 01/23/22 0337 01/24/22 0046  NA 135 137 135  K 4.0 3.7 3.7  CL 102 107 106  CO2 24 21* 20*  GLUCOSE 96 93 89  BUN 9 8 5*  CREATININE 0.77 0.69 0.74  CALCIUM 9.4 9.1 8.8*    GFR: Estimated  Creatinine Clearance: 85.8 mL/min (by C-G formula based on SCr of 0.74 mg/dL).  Liver Function Tests: Recent Labs  Lab 01/22/22 1739 01/23/22 0337  AST 19 16  ALT 17 19  ALKPHOS 158* 142*  BILITOT 0.4 0.5  PROT 8.1 7.1  ALBUMIN 3.9 3.5   Recent Labs  Lab 01/22/22 1739  LIPASE 43   No results for input(s): AMMONIA in the last 168 hours.  Coagulation Profile: No results for input(s): INR, PROTIME in the last 168 hours.  Cardiac Enzymes: No results for input(s): CKTOTAL, CKMB, CKMBINDEX, TROPONINI in the last 168 hours.  BNP (last 3 results) No results for input(s): PROBNP in the last 8760 hours.  Lipid Profile: No results for input(s): CHOL, HDL, LDLCALC, TRIG, CHOLHDL, LDLDIRECT in the last 72 hours.  Thyroid Function Tests: No results for input(s): TSH, T4TOTAL, FREET4, T3FREE, THYROIDAB in the last 72 hours.  Anemia Panel: No results for input(s): VITAMINB12, FOLATE, FERRITIN, TIBC, IRON, RETICCTPCT in the last 72 hours.  Urine analysis:    Component Value Date/Time   COLORURINE AMBER (A) 01/22/2022 1647   APPEARANCEUR HAZY (A) 01/22/2022 1647   LABSPEC 1.015 01/22/2022 1647   PHURINE 6.0 01/22/2022 1647   GLUCOSEU NEGATIVE 01/22/2022 1647   HGBUR MODERATE (A) 01/22/2022 1647   BILIRUBINUR NEGATIVE 01/22/2022 1647   KETONESUR NEGATIVE 01/22/2022 1647   PROTEINUR 30 (A) 01/22/2022 1647   UROBILINOGEN 1.0 09/04/2021 1157   NITRITE POSITIVE (A) 01/22/2022 1647   LEUKOCYTESUR MODERATE (A) 01/22/2022 1647    Sepsis Labs: Lactic Acid, Venous No results found for: LATICACIDVEN  MICROBIOLOGY: Recent Results (from the past 240 hour(s))  Urine Culture     Status: Abnormal   Collection Time: 01/22/22  6:16 PM   Specimen: Urine, Clean Catch  Result Value Ref Range Status   Specimen Description URINE, CLEAN CATCH  Final   Special Requests   Final    NONE Performed at [facility], [place] [address], [place], [place] [address]    Culture MULTIPLE SPECIES  PRESENT, SUGGEST RECOLLECTION (A)  Final   Report Status 01/24/2022 FINAL  Final  Resp Panel by RT-PCR (Flu A&B, Covid) Nasopharyngeal Swab     Status: None   Collection Time: 01/22/22 10:35 PM   Specimen: Nasopharyngeal Swab; Nasopharyngeal(NP) swabs in vial transport medium  Result Value Ref Range Status   SARS Coronavirus 2 by RT PCR NEGATIVE NEGATIVE Final    Comment: (NOTE) SARS-CoV-2 target nucleic acids are NOT DETECTED.  The SARS-CoV-2 RNA is generally detectable in upper respiratory specimens during the acute phase of infection. The lowest concentration of SARS-CoV-2 viral copies this assay can detect is 138 copies/mL. A negative result does not preclude SARS-Cov-2 infection and should not be used as the sole basis for treatment or other patient management decisions. A negative result may occur with  improper specimen collection/handling, submission of specimen other than nasopharyngeal swab, presence of viral mutation(s) within the areas targeted by this assay, and inadequate number of viral copies(<138 copies/mL). A negative result must be combined with clinical observations, patient history, and epidemiological information. The expected result is Negative.  Fact Sheet for Patients:  EntrepreneurPulse.com.au  Fact Sheet for Healthcare Providers:  IncredibleEmployment.be  This test is no t yet approved or cleared by the [place] FDA and  has been authorized for detection and/or diagnosis of SARS-CoV-2 by FDA under an Emergency Use Authorization (EUA). This EUA will remain  in effect (meaning this test can be used) for the duration of the COVID-19 declaration under Section 564(b)(1) of the Act, 21 U.S.C.section 360bbb-3(b)(1), unless the authorization is terminated  or revoked sooner.       Influenza A by PCR NEGATIVE NEGATIVE Final   Influenza B by PCR NEGATIVE NEGATIVE Final    Comment: (NOTE) The Xpert Xpress  SARS-CoV-2/FLU/RSV plus assay is intended as an aid in the diagnosis of influenza from Nasopharyngeal swab specimens and should not be used as a sole basis for treatment. Nasal washings and aspirates are unacceptable for Xpert Xpress SARS-CoV-2/FLU/RSV testing.  Fact Sheet for Patients: EntrepreneurPulse.com.au  Fact Sheet for Healthcare Providers: IncredibleEmployment.be  This test is not yet approved or cleared by the [place] FDA and has been authorized for detection and/or diagnosis of SARS-CoV-2 by FDA under an Emergency Use Authorization (EUA). This EUA will remain in effect (meaning this test can be used) for the duration of the  COVID-19 declaration under Section 564(b)(1) of the Act, 21 U.S.C. section 360bbb-3(b)(1), unless the authorization is terminated or revoked.  Performed at [facility], [place] [address], [place], [place] [address]   Culture, blood (routine x 2)     Status: None (Preliminary result)   Collection Time: 01/23/22 12:15 AM   Specimen: BLOOD  Result Value Ref Range Status   Specimen Description BLOOD SITE NOT SPECIFIED  Final   Special Requests   Final    BOTTLES DRAWN AEROBIC AND ANAEROBIC Blood Culture adequate volume   Culture   Final    NO GROWTH 2 DAYS Performed at [facility], [address] [place], [place] [address]    Report Status PENDING  Incomplete  Culture, blood (routine x 2)     Status: None (Preliminary result)   Collection Time: 01/23/22 12:26 AM   Specimen: BLOOD  Result Value Ref Range Status   Specimen Description BLOOD SITE NOT SPECIFIED  Final   Special Requests   Final    BOTTLES DRAWN AEROBIC AND ANAEROBIC Blood Culture adequate volume   Culture   Final    NO GROWTH 2 DAYS Performed at [facility], [address] [place], [place] [address]    Report Status PENDING  Incomplete  Aerobic/Anaerobic Culture w Gram Stain (surgical/deep wound)     Status: None  (Preliminary result)   Collection Time: 01/23/22  3:40 PM   Specimen: Abscess  Result Value Ref Range Status   Specimen Description ABSCESS  Final   Special Requests NONE  Final   Gram Stain   Final    FEW WBC PRESENT,BOTH PMN AND MONONUCLEAR NO ORGANISMS SEEN    Culture   Final    CULTURE REINCUBATED FOR BETTER GROWTH Performed at [facility], [place] [address] [place], [place] [address]    Report Status PENDING  Incomplete    RADIOLOGY STUDIES/RESULTS: CT Angio Chest Pulmonary Embolism (PE) W or WO Contrast  Addendum Date: 01/23/2022   ADDENDUM REPORT: 01/23/2022 23:42 ADDENDUM: Critical findings were reported to NP Lovey Newcomer at 11:41 p.m. Electronically Signed   By: Brett Fairy M.D.   On: 01/23/2022 23:42   Result Date: 01/23/2022 CLINICAL DATA:  Pulmonary embolism suspected, positive D-dimer. Known bilateral DVTs. EXAM: CT ANGIOGRAPHY CHEST WITH CONTRAST TECHNIQUE: Multidetector CT imaging of the chest was performed using the standard protocol during bolus administration of intravenous contrast. Multiplanar CT image reconstructions and MIPs were obtained to evaluate the vascular anatomy. RADIATION DOSE REDUCTION: This exam was performed according to the departmental dose-optimization program which includes automated exposure control, adjustment of the mA and/or kV according to patient size and/or use of iterative reconstruction technique. CONTRAST:  52mL OMNIPAQUE IOHEXOL 350 MG/ML SOLN COMPARISON:  01/22/2022. FINDINGS: Cardiovascular: The heart is normal in size and there is no pericardial effusion. The aorta and pulmonary trunk are normal in caliber. Filling defects are noted in the left pulmonary artery extending into the left upper and lower lobe lobar and segmental arteries. Segmental and subsegmental pulmonary artery filling defects are present in the right lower lobe. No evidence of right heart strain. Mediastinum/Nodes: No enlarged mediastinal, hilar, or axillary lymph  nodes. Thyroid gland, trachea, and esophagus demonstrate no significant findings. Lungs/Pleura: Lungs are clear. No pleural effusion or pneumothorax. Upper Abdomen: No acute abnormality. Musculoskeletal: No chest wall abnormality. No acute or significant osseous findings. Review of the MIP images confirms the above findings. IMPRESSION: Bilateral pulmonary emboli, moderate thrombus burden.  No evidence of right heart strain. Electronically Signed: By: Brett Fairy M.D. On: 01/23/2022 23:37   CT IMAGE GUIDED DRAINAGE BY PERCUTANEOUS CATHETER  Result Date: 01/23/2022 CLINICAL DATA:  Post hysterectomy and partial bowel resection at outside institution. Complex 7.1 cm left pelvic collection. Abdominal pain. EXAM: CT GUIDED DRAINAGE OF PELVIC ABSCESS ANESTHESIA/SEDATION: Lidocaine 1% subcutaneous. Moderate sedation was not utilized because of hypotension. PROCEDURE: The procedure, risks, benefits, and alternatives were explained to the patient. Questions regarding the procedure were encouraged and answered. The patient understands and consents to the procedure. Select axial scans through the pelvis were obtained. The complex left extraperitoneal collection was localized and an appropriate skin entry site was determined and marked. The operative field was prepped with chlorhexidinein a sterile fashion, and a sterile drape was applied covering the operative field. A sterile gown and sterile gloves were used for the procedure. Local anesthesia was provided with 1% Lidocaine. Under CT fluoroscopic guidance, a 19 gauge percutaneous entry needle advanced into the collection. Bloody fluid could be aspirated. Amplatz guidewire advanced easily within the collection, position confirmed on CT. Tract dilated to facilitate placement of 12 French pigtail drain catheter, placed centrally within the collection. A relatively small volume of bloody fluid could be aspirated. Given concerns this could represent infected hematoma, the  catheter was left in place, secured externally with 0 Prolene suture and StatLock, and placed to suction bulb drainage. The aspirate was sent for Gram stain and culture. The patient tolerated the procedure well. COMPLICATIONS: None immediate FINDINGS: Complex left extraperitoneal fluid collection localized. 12 French pigtail drain catheter placed as above. Small volume bloody aspirate sent for Gram stain and culture. IMPRESSION: 1. Technically successful CT-guided left pelvic abscess drain catheter placement. RADIATION DOSE REDUCTION: This exam was performed according to the departmental dose-optimization program which includes automated exposure control, adjustment of the mA and/or kV according to patient size and/or use of iterative reconstruction technique. Electronically Signed   By: Lucrezia Europe M.D.   On: 01/23/2022 16:18     LOS: 3 days   Kayleen Memos, MD  [facility]    To contact the attending provider between 7A-7P or the covering provider during after hours 7P-7A, please log into the web site [email or web address] and access using universal [place] password for that web site. If you do not have the password, please call the hospital operator.  01/25/2022, 1:00 PM

## 2022-01-25 NOTE — Progress Notes (Signed)
ANTICOAGULATION CONSULT NOTE   Pharmacy Consult for heparin Indication: DVT  Allergies  Allergen Reactions   Tape     Adhesive tape- rash    Patient Measurements: Height: 4\' 11"  (149.9 cm) Weight: 66.2 kg (146 lb) IBW/kg (Calculated) : 43.2 Heparin Dosing Weight: 66 kg  Vital Signs: Temp: 99 F (37.2 C) (02/18 1928) Temp Source: Oral (02/18 1928) BP: 97/73 (02/18 1928) Pulse Rate: 81 (02/18 1928)  Labs: Recent Labs    01/22/22 1739 01/23/22 0337 01/23/22 0635 01/24/22 0046 01/24/22 0749 01/24/22 1538 01/25/22 0051  HGB 13.9 12.6  --  12.0  --   --  11.8*  HCT 41.2 38.1  --  36.3  --   --  35.1*  PLT 171 144*  --  143*  --   --  172  HEPARINUNFRC  --   --    < > 0.30 0.83* 0.64 0.61  CREATININE 0.77 0.69  --  0.74  --   --   --    < > = values in this interval not displayed.     Estimated Creatinine Clearance: 85.8 mL/min (by C-G formula based on SCr of 0.74 mg/dL).   Medical History: Past Medical History:  Diagnosis Date   Medical history non-contributory     Assessment: 28 YOF with acute DVT involving the R iliac and common femoral veins to start IV Heparin. H/H and Plt wnl. SCr wnl   Heparin level therapeutic   Goal of Therapy:  Heparin level 0.3-0.7 units/ml Monitor platelets by anticoagulation protocol: Yes   Plan:  Continue heparin gtt at 1000 units/hr Daily heparin level, CBC, s/s bleeding F/u IR plans and ability to transition to PO  Thank you Anette Guarneri, PharmD Please refer to [facility] for unit-specific pharmacist

## 2022-01-26 ENCOUNTER — Other Ambulatory Visit: Payer: Self-pay

## 2022-01-26 ENCOUNTER — Other Ambulatory Visit (HOSPITAL_COMMUNITY): Payer: Self-pay

## 2022-01-26 ENCOUNTER — Encounter: Payer: Self-pay | Admitting: Surgery

## 2022-01-26 ENCOUNTER — Other Ambulatory Visit: Payer: Self-pay | Admitting: Radiology

## 2022-01-26 ENCOUNTER — Encounter (HOSPITAL_COMMUNITY): Payer: Self-pay | Admitting: Internal Medicine

## 2022-01-26 DIAGNOSIS — K651 Peritoneal abscess: Secondary | ICD-10-CM | POA: Diagnosis not present

## 2022-01-26 DIAGNOSIS — N739 Female pelvic inflammatory disease, unspecified: Secondary | ICD-10-CM

## 2022-01-26 DIAGNOSIS — Z932 Ileostomy status: Secondary | ICD-10-CM | POA: Insufficient documentation

## 2022-01-26 LAB — CBC
HCT: 38.6 % (ref 36.0–46.0)
Hemoglobin: 12.6 g/dL (ref 12.0–15.0)
MCH: 29.6 pg (ref 26.0–34.0)
MCHC: 32.6 g/dL (ref 30.0–36.0)
MCV: 90.6 fL (ref 80.0–100.0)
Platelets: 220 10*3/uL (ref 150–400)
RBC: 4.26 MIL/uL (ref 3.87–5.11)
RDW: 14.8 % (ref 11.5–15.5)
WBC: 4.4 10*3/uL (ref 4.0–10.5)
nRBC: 0 % (ref 0.0–0.2)

## 2022-01-26 LAB — HEPARIN LEVEL (UNFRACTIONATED): Heparin Unfractionated: 0.64 IU/mL (ref 0.30–0.70)

## 2022-01-26 MED ORDER — AMOXICILLIN-POT CLAVULANATE 875-125 MG PO TABS
1.0000 | ORAL_TABLET | Freq: Two times a day (BID) | ORAL | 0 refills | Status: DC
  Filled 2022-01-26: qty 10, 5d supply, fill #0

## 2022-01-26 MED ORDER — ADULT MULTIVITAMIN W/MINERALS CH
1.0000 | ORAL_TABLET | Freq: Every day | ORAL | Status: DC
  Administered 2022-01-26 – 2022-01-27 (×2): 1 via ORAL
  Filled 2022-01-26 (×2): qty 1

## 2022-01-26 MED ORDER — APIXABAN (ELIQUIS) VTE STARTER PACK (10MG AND 5MG)
ORAL_TABLET | ORAL | 0 refills | Status: DC
  Filled 2022-01-26: qty 74, 30d supply, fill #0

## 2022-01-26 MED ORDER — APIXABAN 5 MG PO TABS
5.0000 mg | ORAL_TABLET | Freq: Two times a day (BID) | ORAL | 0 refills | Status: DC
  Filled 2022-01-26: qty 180, 90d supply, fill #0

## 2022-01-26 MED ORDER — APIXABAN 5 MG PO TABS
10.0000 mg | ORAL_TABLET | Freq: Two times a day (BID) | ORAL | Status: DC
  Administered 2022-01-26 – 2022-01-27 (×3): 10 mg via ORAL
  Filled 2022-01-26 (×3): qty 2

## 2022-01-26 MED ORDER — HYDROMORPHONE HCL 1 MG/ML IJ SOLN
0.5000 mg | INTRAMUSCULAR | Status: DC | PRN
Start: 2022-01-26 — End: 2022-01-27

## 2022-01-26 MED ORDER — APIXABAN 5 MG PO TABS
5.0000 mg | ORAL_TABLET | Freq: Two times a day (BID) | ORAL | Status: DC
Start: 2022-02-02 — End: 2022-01-27

## 2022-01-26 MED ORDER — CERTAVITE/ANTIOXIDANTS PO TABS
1.0000 | ORAL_TABLET | Freq: Every day | ORAL | 0 refills | Status: AC
Start: 2022-01-27 — End: 2022-04-27
  Filled 2022-01-26: qty 90, 90d supply, fill #0

## 2022-01-26 MED ORDER — AMOXICILLIN-POT CLAVULANATE 875-125 MG PO TABS
1.0000 | ORAL_TABLET | Freq: Two times a day (BID) | ORAL | Status: DC
  Administered 2022-01-26 – 2022-01-27 (×3): 1 via ORAL
  Filled 2022-01-26 (×3): qty 1

## 2022-01-26 MED ORDER — OXYCODONE HCL 5 MG PO TABS
5.0000 mg | ORAL_TABLET | Freq: Four times a day (QID) | ORAL | 0 refills | Status: AC | PRN
  Filled 2022-01-26: qty 12, 3d supply, fill #0

## 2022-01-26 NOTE — Discharge Instructions (Signed)
Information on my medicine - ELIQUIS (apixaban)  This medication education was reviewed with me or my healthcare representative as part of my discharge preparation.  The pharmacist that spoke with me during my hospital stay was:  Franky Macho, [facility]  Why was Eliquis prescribed for you? Eliquis was prescribed to treat blood clots that may have been found in the veins of your legs (deep vein thrombosis) or in your lungs (pulmonary embolism) and to reduce the risk of them occurring again.  What do You need to know about Eliquis ? The starting dose is 10 mg (two 5 mg tablets) taken TWICE daily for the FIRST SEVEN (7) DAYS, then on Monday, February 27  the dose is reduced to ONE 5 mg tablet taken TWICE daily.  Eliquis may be taken with or without food.   Try to take the dose about the same time in the morning and in the evening. If you have difficulty swallowing the tablet whole please discuss with your pharmacist how to take the medication safely.  Take Eliquis exactly as prescribed and DO NOT stop taking Eliquis without talking to the doctor who prescribed the medication.  Stopping may increase your risk of developing a new blood clot.  Refill your prescription before you run out.  After discharge, you should have regular check-up appointments with your healthcare provider that is prescribing your Eliquis.    What do you do if you miss a dose? If a dose of ELIQUIS is not taken at the scheduled time, take it as soon as possible on the same day and twice-daily administration should be resumed. The dose should not be doubled to make up for a missed dose.  Important Safety Information A possible side effect of Eliquis is bleeding. You should call your healthcare provider right away if you experience any of the following: Bleeding from an injury or your nose that does not stop. Unusual colored urine (red or dark brown) or unusual colored stools (red or black). Unusual bruising for unknown  reasons. A serious fall or if you hit your head (even if there is no bleeding).  Some medicines may interact with Eliquis and might increase your risk of bleeding or clotting while on Eliquis. To help avoid this, consult your healthcare provider or pharmacist prior to using any new prescription or non-prescription medications, including herbals, vitamins, non-steroidal anti-inflammatory drugs (NSAIDs) and supplements.  This website has more information on Eliquis (apixaban): http://www.eliquis.com/eliquis/home

## 2022-01-26 NOTE — TOC Benefit Eligibility Note (Signed)
Patient Product/process development scientist completed.    The patient is currently admitted and upon discharge could be taking Eliquis 5 mg.  The current 30 day co-pay is, $4.00.   The patient is currently admitted and upon discharge could be taking Xarelto 20 mg.  The current 30 day co-pay is, $4.00.   The patient is insured through [employer]     Roland Earl, CPhT Pharmacy Patient Advocate Specialist [facility] Patient Advocate Team Direct Number: [phone]  Fax: [phone]

## 2022-01-26 NOTE — Progress Notes (Signed)
Mobility Specialist Progress Note:   01/26/22 1624  Mobility  Activity Ambulated independently in hallway  Level of Assistance Independent  Assistive Device None  Distance Ambulated (ft) 1000 ft  Activity Response Tolerated well  $Mobility charge 1 Mobility   [facility] Phone [phone]

## 2022-01-26 NOTE — Progress Notes (Signed)
Progress Note     Subjective: Pt denies abdominal pain or n/v. She is tolerating diet and having ileostomy output. She has some minor bleeding occasionally around drain site.   Video interpreter used to communicate with patient.   Objective: Vital signs in last 24 hours: Temp:  [97.8 F (36.6 C)-98.7 F (37.1 C)] 97.9 F (36.6 C) (02/20 0918) Pulse Rate:  [65-79] 70 (02/20 0918) Resp:  [16-18] 17 (02/20 0918) BP: (89-97)/(63-72) 91/67 (02/20 0918) SpO2:  [98 %-100 %] 100 % (02/20 0918) Last BM Date : 01/24/22  Intake/Output from previous day: 02/19 0701 - 02/20 0700 In: 765 [P.O.:760] Out: 40 [Drains:40] Intake/Output this shift: No intake/output data recorded.  PE: General: pleasant, WD, WN female who is sitting up in chair  Heart: regular, rate, and rhythm.  Lungs: Respiratory effort nonlabored Abd: soft, NT, ND, +BS, ileostomy in RLQ functioning, drain in LLQ with small amount bloody fluid in bulb MS: all 4 extremities are symmetrical with no cyanosis, clubbing, or edema. Skin: warm and dry with no masses, lesions, or rashes Psych: A&Ox3 with an appropriate affect.    Lab Results:  Recent Labs    01/25/22 0051 01/26/22 0057  WBC 4.4 4.4  HGB 11.8* 12.6  HCT 35.1* 38.6  PLT 172 220   BMET Recent Labs    01/24/22 0046  NA 135  K 3.7  CL 106  CO2 20*  GLUCOSE 89  BUN 5*  CREATININE 0.74  CALCIUM 8.8*   PT/INR No results for input(s): LABPROT, INR in the last 72 hours. CMP     Component Value Date/Time   NA 135 01/24/2022 0046   K 3.7 01/24/2022 0046   CL 106 01/24/2022 0046   CO2 20 (L) 01/24/2022 0046   GLUCOSE 89 01/24/2022 0046   BUN 5 (L) 01/24/2022 0046   CREATININE 0.74 01/24/2022 0046   CALCIUM 8.8 (L) 01/24/2022 0046   PROT 7.1 01/23/2022 0337   ALBUMIN 3.5 01/23/2022 0337   AST 16 01/23/2022 0337   ALT 19 01/23/2022 0337   ALKPHOS 142 (H) 01/23/2022 0337   BILITOT 0.5 01/23/2022 0337   GFRNONAA >60 01/24/2022 0046   GFRAA  >60 07/08/2018 1640   Lipase     Component Value Date/Time   LIPASE 43 01/22/2022 1739       Studies/Results: No results found.  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 We will sign off.  - will work on arranging follow up in our office for eventual discussion of ileostomy reversal - although I suspect it would be at least 6 months before this would  surgically be an option   FEN: reg diet, IVF @ 50cc/h per [facility] VTE: heparin gtt ID: zosyn 2/17>> given no growth in cxs and no leukocytosis, not sure patient really needs further abx at this point   Recent pregnancy resulting in emergency C-section L common femoral and external iliac DVTs, R common femoral DVT - heparin gtt, transition to [place] per primary service   LOS: 4 days   I reviewed Consultant IR notes, hospitalist notes, last 24 h vitals and pain scores, last 48 h intake and output, and last 24 h labs and trends.  This care required low level of medical decision making.    Norm Parcel, [facility] 01/26/2022, 10:02 AM Please see Amion for pager number during day hours  7:00am-4:30pm

## 2022-01-26 NOTE — Progress Notes (Addendum)
ANTICOAGULATION CONSULT NOTE   Pharmacy Consult for heparin ->apixaban Indication: DVT  Allergies  Allergen Reactions   Tape     Adhesive tape- rash    Patient Measurements: Height: 4\' 11"  (149.9 cm) Weight: 66.2 kg (146 lb) IBW/kg (Calculated) : 43.2 Heparin Dosing Weight: 66 kg  Vital Signs: Temp: 98.4 F (36.9 C) (02/20 0445) Temp Source: Oral (02/20 0445) BP: 89/63 (02/20 0445) Pulse Rate: 65 (02/20 0445)  Labs: Recent Labs    01/24/22 0046 01/24/22 0749 01/24/22 1538 01/25/22 0051 01/26/22 0057  HGB 12.0  --   --  11.8* 12.6  HCT 36.3  --   --  35.1* 38.6  PLT 143*  --   --  172 220  HEPARINUNFRC 0.30   < > 0.64 0.61 0.64  CREATININE 0.74  --   --   --   --    < > = values in this interval not displayed.     Estimated Creatinine Clearance: 85.8 mL/min (by C-G formula based on SCr of 0.74 mg/dL).   Medical History: Past Medical History:  Diagnosis Date   Medical history non-contributory     Assessment: 41 YOF with acute DVT involving the R iliac and common femoral veins to start IV Heparin.  Heparin level remains therapeutic. Note plan to transition to po AC today if no further surgery needed. CBC stable.  Goal of Therapy:  Heparin level 0.3-0.7 units/ml Monitor platelets by anticoagulation protocol: Yes   Plan:  Continue heparin gtt at 1000 units/hr Daily heparin level, CBC, s/s bleeding F/u ability to transition to PO  37, PharmD, BCPS Please see amion for complete clinical pharmacist phone list 01/26/2022 7:41 AM  Addendum (1300): Heparin transitioning to apixaban. Pt with some bleeding from incision site.  Plan: Apixaban 10mg  po BID x 7 days then on Monday, 2/27 change to 5mg  po BID Note copay is $4 Will need apixaban education with Spanish interpreter F/u bleeding from incision site  Thursday, PharmD, BCPS Please see amion for complete clinical pharmacist phone list 01/26/2022 12:50 PM

## 2022-01-26 NOTE — Progress Notes (Signed)
PROGRESS NOTE        PATIENT DETAILS Name: Maria Riley Age: 31 y.o. Sex: female Date of Birth: 11/24/1991 Admit Date: 01/22/2022 Admitting Physician Lenore Cordia, MD QP:[id number], No Pcp Per (Inactive)  Brief Summary: Maria Riley is a 31 y.o. female who recently had a emergent C-section in January 2023 in [place] resulting in postoperative complications that resulted in right hemicolectomy with ostomy, hysterectomy, she has 3 children, a son 38 years old, 2 daughters 53 and 22 years old.-presented with several weeks of abdominal pain found to have pelvic abscess and acute DVT involving her lower extremities, seen on CT AP w contrast 2/16 .  See below for further details.  Significant Hospital events: 2/16>> admit to [facility] for pelvic abscess and bilateral lower extremity DVT.  Significant imaging studies: 2/16>> CT abdomen/pelvis: Multiple pelvic/abdominal abscesses.  DVT involving common/external iliac veins in the left and common femoral vein on the right.  Significant microbiology data: 2/16>> COVID/influenza PCR: Negative 2/16>> urine culture: Suggest recollection. 2/17>> blood culture: Negative to date. 01/23/2022: Abscess culture reincubated for better growth. 01/26/22: General surgery signed off, will see outpatient.  IR, Dr. Kathlene Cote, contacted to provide further recommendations.  Procedures:  CT L pelvic abscess drain placement by Dr. Vernard Gambles on 2/17.  Consults:  [facility] IR  Subjective: Patient was seen and examined at bedside.  There were no acute events overnight.  She has no new complaints.  General surgery signed off.  She will follow-up with general surgery outpatient.  IR contacted to provide recommendation for DC planning.  Objective: Vitals: Blood pressure 91/67, pulse 70, temperature 97.9 F (36.6 C), temperature source Oral, resp. rate 17, height 4\' 11"  (1.499 m), weight 66.2 kg, last menstrual period 06/08/2021, SpO2 100 %, unknown if  currently breastfeeding.   Exam: Gen Exam: Well-developed well-nourished no acute distress. HEENT: Atraumatic, normocephalic. Chest: Clear to auscultation no wheezes or rales. CVS: Regular rate and rhythm no rubs or gallops. Abdomen: Soft ostomy in place.  Bowel sounds present. Extremities: No lower extremity edema bilaterally. Neurology: Nonfocal exam. Skin: No rashes or ulcerative lesions noted.  Pertinent Labs/Radiology: CBC Latest Ref Rng & Units 01/26/2022 01/25/2022 01/24/2022  WBC 4.0 - 10.5 K/uL 4.4 4.4 4.9  Hemoglobin 12.0 - 15.0 g/dL 12.6 11.8(L) 12.0  Hematocrit 36.0 - 46.0 % 38.6 35.1(L) 36.3  Platelets 150 - 400 K/uL 220 172 143(L)    Lab Results  Component Value Date   NA 135 01/24/2022   K 3.7 01/24/2022   CL 106 01/24/2022   CO2 20 (L) 01/24/2022      Assessment/Plan: Pelvic abscess seen on CT scan abdomen pelvis with contrast 01/22/22:  Likely a sequelae of her recent C-section/hemicolectomy- Received 5 days of IV antibiotics.  Switched to Augmentin x5 days on 01/26/2022. Post drain placement by IR on 01/23/2022. General surgery, followed and signed off on 01/26/22.   Abscess culture reintubated for better growth, rare Staphylococcus haemolyticus.   40 cc recorded in the last 24 hours.   Continue to follow cultures for ID and sensitivities.  Acute bilateral lower extremity DVT seen on CT AP scan done on 01/22/2022: Switched to Eliquis on 01/26/2022, no additional surgery planned by general surgery or IR.  Recent pregnancy resulting in emergent C-section, 1 month status post multiple abdominal surgeries, right hemicolectomy and end ileostomy- Per patient-this was due to  numerous placental issues-claims that the baby did not survive.  She is not breast-feeding. She has 3 children, a son 37 years old, 2 daughters 69 and 37 years old. She will follow up with general surgery outpatient for possible reversal of ileostomy.  Mild non-anion gap metabolic acidosis Serum  bicarb 20 Anion gap 9  Resolved mild thrombocytopenia, secondary to acute illness Platelet count has normalized 220.  BMI: Estimated body mass index is 29.49 kg/m as calculated from the following:   Height as of this encounter: 4\' 11"  (1.499 m).   Weight as of this encounter: 66.2 kg.   Code status:   Code Status: Full Code   DVT Prophylaxis: IV heparin, switched to Eliquis 01/26/22.  Family Communication: None at bedside   Disposition Plan: Status is: Inpatient Remains inpatient appropriate because: Multiple pelvic abscesses-awaiting drain placement by IR-on IV antibiotics.   Planned Discharge Destination: Home 01/27/22   Diet: Diet Order             Diet regular Room service appropriate? Yes; Fluid consistency: Thin  Diet effective now                     Antimicrobial agents: Anti-infectives (From admission, onward)    Start     Dose/Rate Route Frequency Ordered Stop   01/23/22 0800  piperacillin-tazobactam (ZOSYN) IVPB 3.375 g        3.375 g 12.5 mL/hr over 240 Minutes Intravenous Every 8 hours 01/23/22 0054     01/23/22 0100  piperacillin-tazobactam (ZOSYN) IVPB 3.375 g        3.375 g 100 mL/hr over 30 Minutes Intravenous  Once 01/23/22 0054 01/23/22 0204        MEDICATIONS: Scheduled Meds:  multivitamin with minerals  1 tablet Oral Daily   sodium chloride flush  5 mL Intracatheter Q8H   Continuous Infusions:  heparin 1,000 Units/hr (01/25/22 2150)   lactated ringers 50 mL/hr at 01/26/22 0718   piperacillin-tazobactam (ZOSYN)  IV 3.375 g (01/26/22 0747)   PRN Meds:.acetaminophen **OR** acetaminophen, HYDROcodone-acetaminophen, morphine injection, ondansetron **OR** ondansetron (ZOFRAN) IV   I have personally reviewed following labs and imaging studies  LABORATORY DATA: CBC: Recent Labs  Lab 01/22/22 1739 01/23/22 0337 01/24/22 0046 01/25/22 0051 01/26/22 0057  WBC 5.3 4.5 4.9 4.4 4.4  NEUTROABS 3.1  --   --   --   --   HGB 13.9  12.6 12.0 11.8* 12.6  HCT 41.2 38.1 36.3 35.1* 38.6  MCV 89.0 89.4 90.3 89.3 90.6  PLT 171 144* 143* 172 [id number]    Basic Metabolic Panel: Recent Labs  Lab 01/22/22 1739 01/23/22 0337 01/24/22 0046  NA 135 137 135  K 4.0 3.7 3.7  CL 102 107 106  CO2 24 21* 20*  GLUCOSE 96 93 89  BUN 9 8 5*  CREATININE 0.77 0.69 0.74  CALCIUM 9.4 9.1 8.8*    GFR: Estimated Creatinine Clearance: 85.8 mL/min (by C-G formula based on SCr of 0.74 mg/dL).  Liver Function Tests: Recent Labs  Lab 01/22/22 1739 01/23/22 0337  AST 19 16  ALT 17 19  ALKPHOS 158* 142*  BILITOT 0.4 0.5  PROT 8.1 7.1  ALBUMIN 3.9 3.5   Recent Labs  Lab 01/22/22 1739  LIPASE 43   No results for input(s): AMMONIA in the last 168 hours.  Coagulation Profile: No results for input(s): INR, PROTIME in the last 168 hours.  Cardiac Enzymes: No results for input(s): CKTOTAL, CKMB, CKMBINDEX, TROPONINI in the  last 168 hours.  BNP (last 3 results) No results for input(s): PROBNP in the last 8760 hours.  Lipid Profile: No results for input(s): CHOL, HDL, LDLCALC, TRIG, CHOLHDL, LDLDIRECT in the last 72 hours.  Thyroid Function Tests: No results for input(s): TSH, T4TOTAL, FREET4, T3FREE, THYROIDAB in the last 72 hours.  Anemia Panel: No results for input(s): VITAMINB12, FOLATE, FERRITIN, TIBC, IRON, RETICCTPCT in the last 72 hours.  Urine analysis:    Component Value Date/Time   COLORURINE AMBER (A) 01/22/2022 1647   APPEARANCEUR HAZY (A) 01/22/2022 1647   LABSPEC 1.015 01/22/2022 1647   PHURINE 6.0 01/22/2022 1647   GLUCOSEU NEGATIVE 01/22/2022 1647   HGBUR MODERATE (A) 01/22/2022 1647   BILIRUBINUR NEGATIVE 01/22/2022 1647   KETONESUR NEGATIVE 01/22/2022 1647   PROTEINUR 30 (A) 01/22/2022 1647   UROBILINOGEN 1.0 09/04/2021 1157   NITRITE POSITIVE (A) 01/22/2022 1647   LEUKOCYTESUR MODERATE (A) 01/22/2022 1647    Sepsis Labs: Lactic Acid, Venous No results found for:  LATICACIDVEN  MICROBIOLOGY: Recent Results (from the past 240 hour(s))  Urine Culture     Status: Abnormal   Collection Time: 01/22/22  6:16 PM   Specimen: Urine, Clean Catch  Result Value Ref Range Status   Specimen Description URINE, CLEAN CATCH  Final   Special Requests   Final    NONE Performed at [facility], [place] [address], [place], [place] [address]    Culture MULTIPLE SPECIES PRESENT, SUGGEST RECOLLECTION (A)  Final   Report Status 01/24/2022 FINAL  Final  Resp Panel by RT-PCR (Flu A&B, Covid) Nasopharyngeal Swab     Status: None   Collection Time: 01/22/22 10:35 PM   Specimen: Nasopharyngeal Swab; Nasopharyngeal(NP) swabs in vial transport medium  Result Value Ref Range Status   SARS Coronavirus 2 by RT PCR NEGATIVE NEGATIVE Final    Comment: (NOTE) SARS-CoV-2 target nucleic acids are NOT DETECTED.  The SARS-CoV-2 RNA is generally detectable in upper respiratory specimens during the acute phase of infection. The lowest concentration of SARS-CoV-2 viral copies this assay can detect is 138 copies/mL. A negative result does not preclude SARS-Cov-2 infection and should not be used as the sole basis for treatment or other patient management decisions. A negative result may occur with  improper specimen collection/handling, submission of specimen other than nasopharyngeal swab, presence of viral mutation(s) within the areas targeted by this assay, and inadequate number of viral copies(<138 copies/mL). A negative result must be combined with clinical observations, patient history, and epidemiological information. The expected result is Negative.  Fact Sheet for Patients:  EntrepreneurPulse.com.au  Fact Sheet for Healthcare Providers:  IncredibleEmployment.be  This test is no t yet approved or cleared by the [place] FDA and  has been authorized for detection and/or diagnosis of SARS-CoV-2 by FDA under an Emergency Use  Authorization (EUA). This EUA will remain  in effect (meaning this test can be used) for the duration of the COVID-19 declaration under Section 564(b)(1) of the Act, 21 U.S.C.section 360bbb-3(b)(1), unless the authorization is terminated  or revoked sooner.       Influenza A by PCR NEGATIVE NEGATIVE Final   Influenza B by PCR NEGATIVE NEGATIVE Final    Comment: (NOTE) The Xpert Xpress SARS-CoV-2/FLU/RSV plus assay is intended as an aid in the diagnosis of influenza from Nasopharyngeal swab specimens and should not be used as a sole basis for treatment. Nasal washings and aspirates are unacceptable for Xpert Xpress SARS-CoV-2/FLU/RSV testing.  Fact Sheet for Patients: EntrepreneurPulse.com.au  Fact Sheet for Healthcare Providers: IncredibleEmployment.be  This test is not yet approved or cleared by the [place] FDA and has been authorized for detection and/or diagnosis of SARS-CoV-2 by FDA under an Emergency Use Authorization (EUA). This EUA will remain in effect (meaning this test can be used) for the duration of the COVID-19 declaration under Section 564(b)(1) of the Act, 21 U.S.C. section 360bbb-3(b)(1), unless the authorization is terminated or revoked.  Performed at [facility], [place] [address] [place], [place] [address]   Culture, blood (routine x 2)     Status: None (Preliminary result)   Collection Time: 01/23/22 12:15 AM   Specimen: BLOOD  Result Value Ref Range Status   Specimen Description BLOOD SITE NOT SPECIFIED  Final   Special Requests   Final    BOTTLES DRAWN AEROBIC AND ANAEROBIC Blood Culture adequate volume   Culture   Final    NO GROWTH 3 DAYS Performed at [facility], [address], [place], [place] [address]    Report Status PENDING  Incomplete  Culture, blood (routine x 2)     Status: None (Preliminary result)   Collection Time: 01/23/22 12:26 AM   Specimen: BLOOD  Result Value Ref Range  Status   Specimen Description BLOOD SITE NOT SPECIFIED  Final   Special Requests   Final    BOTTLES DRAWN AEROBIC AND ANAEROBIC Blood Culture adequate volume   Culture   Final    NO GROWTH 3 DAYS Performed at [facility], [address] [place], [place] [address]    Report Status PENDING  Incomplete  Aerobic/Anaerobic Culture w Gram Stain (surgical/deep wound)     Status: None (Preliminary result)   Collection Time: 01/23/22  3:40 PM   Specimen: Abscess  Result Value Ref Range Status   Specimen Description ABSCESS  Final   Special Requests NONE  Final   Gram Stain   Final    FEW WBC PRESENT,BOTH PMN AND MONONUCLEAR NO ORGANISMS SEEN Performed at [facility], [place] [address], [place], [place] [address]    Culture   Final    RARE STAPHYLOCOCCUS HAEMOLYTICUS SUSCEPTIBILITIES TO FOLLOW NO ANAEROBES ISOLATED; CULTURE IN PROGRESS FOR 5 DAYS    Report Status PENDING  Incomplete    RADIOLOGY STUDIES/RESULTS: No results found.   LOS: 4 days   Kayleen Memos, MD  [facility]    To contact the attending provider between 7A-7P or the covering provider during after hours 7P-7A, please log into the web site [email or web address] and access using universal [place] password for that web site. If you do not have the password, please call the hospital operator.  01/26/2022, 12:38 PM

## 2022-01-26 NOTE — Progress Notes (Signed)
Referring Physician(s): Dr. Darreld McleanVishal Patel  Supervising Physician: Dr. Fredia SorrowYamagata  [facility]  [facility]  Chief Complaint:  CT-guided left pelvic abscess drain catheter placement 01/23/22  Subjective:  Pt seen in bed. Has had some oozing at drain site. Also some pain near drain site as well.  Allergies: Tape  Medications:  Current Facility-Administered Medications:    acetaminophen (TYLENOL) tablet 650 mg, 650 mg, Oral, Q6H PRN, 650 mg at 01/24/22 2049 **OR** acetaminophen (TYLENOL) suppository 650 mg, 650 mg, Rectal, Q6H PRN, Charlsie QuestPatel, Vishal R, MD   amoxicillin-clavulanate (AUGMENTIN) 875-125 MG per tablet 1 tablet, 1 tablet, Oral, Q12H, Hall, Carole N, DO, 1 tablet at 01/26/22 1312   apixaban (ELIQUIS) tablet 10 mg, 10 mg, Oral, BID, 10 mg at 01/26/22 1311 **FOLLOWED BY** [START ON 02/02/2022] apixaban (ELIQUIS) tablet 5 mg, 5 mg, Oral, BID, Amend, Emiliano DyerCaron G, RPH   HYDROcodone-acetaminophen (NORCO/VICODIN) 5-325 MG per tablet 1-2 tablet, 1-2 tablet, Oral, Q4H PRN, Oley BalmHassell, Daniel, MD, 2 tablet at 01/23/22 2000   HYDROmorphone (DILAUDID) injection 0.5 mg, 0.5 mg, Intravenous, Q4H PRN, Margo AyeHall, Carole N, DO   multivitamin with minerals tablet 1 tablet, 1 tablet, Oral, Daily, Hall, Carole N, DO, 1 tablet at 01/26/22 1234   ondansetron (ZOFRAN) tablet 4 mg, 4 mg, Oral, Q6H PRN **OR** ondansetron (ZOFRAN) injection 4 mg, 4 mg, Intravenous, Q6H PRN, Allena KatzPatel, Vishal R, MD   sodium chloride flush (NS) 0.9 % injection 5 mL, 5 mL, Intracatheter, Q8H, Oley BalmHassell, Daniel, MD, 5 mL at 01/26/22 1312    Vital Signs: BP 91/67 (BP Location: Left Arm)    Pulse 70    Temp 97.9 F (36.6 C) (Oral)    Resp 17    Ht 4\' 11"  (1.499 m)    Wt 66.2 kg    LMP 06/08/2021 (Approximate)    SpO2 100%    BMI 29.49 kg/m   Physical Exam Vitals reviewed.  Constitutional:      Appearance: She is well-developed.  HENT:     Head: Normocephalic and atraumatic.  Cardiovascular:     Rate and Rhythm: Normal rate.   Pulmonary:     Effort: Pulmonary effort is normal. No respiratory distress.  Abdominal:     Comments: Drain intact, site with some dried blood and fresh clot at insertion site. Flushes/aspirates easily, return of old thin dark bloody fluid Dressing C/D/I. 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   Neurological:     Mental Status: She is alert.    Imaging: CT Angio Chest Pulmonary Embolism (PE) W or WO Contrast  Addendum Date: 01/23/2022   ADDENDUM REPORT: 01/23/2022 23:42 ADDENDUM: Critical findings were reported to NP Audrea MuscatXenia Blount at 11:41 p.m. Electronically Signed   By: Thornell SartoriusLaura  Taylor M.D.   On: 01/23/2022 23:42   Result Date: 01/23/2022 CLINICAL DATA:  Pulmonary embolism suspected, positive D-dimer. Known bilateral DVTs. EXAM: CT ANGIOGRAPHY CHEST WITH CONTRAST TECHNIQUE: Multidetector CT imaging of the chest was performed using the standard protocol during bolus administration of intravenous contrast. Multiplanar CT image reconstructions and MIPs were obtained to evaluate the vascular anatomy. RADIATION DOSE REDUCTION: This exam was performed according to the departmental dose-optimization program which includes automated exposure control, adjustment of the mA and/or kV according to patient size and/or use of iterative reconstruction technique. CONTRAST:  75mL OMNIPAQUE IOHEXOL 350 MG/ML SOLN COMPARISON:  01/22/2022. FINDINGS: Cardiovascular: The heart is normal in size and there is no pericardial effusion. The aorta and pulmonary trunk are normal in caliber. Filling defects are noted in  the left pulmonary artery extending into the left upper and lower lobe lobar and segmental arteries. Segmental and subsegmental pulmonary artery filling defects are present in the right lower lobe. No evidence of right heart strain. Mediastinum/Nodes: No enlarged mediastinal, hilar, or axillary lymph nodes. Thyroid gland, trachea, and esophagus demonstrate no significant findings. Lungs/Pleura: Lungs are clear. No pleural  effusion or pneumothorax. Upper Abdomen: No acute abnormality. Musculoskeletal: No chest wall abnormality. No acute or significant osseous findings. Review of the MIP images confirms the above findings. IMPRESSION: Bilateral pulmonary emboli, moderate thrombus burden. No evidence of right heart strain. Electronically Signed: By: Thornell Sartorius M.D. On: 01/23/2022 23:37   CT ABDOMEN PELVIS W CONTRAST  Addendum Date: 01/23/2022   ADDENDUM REPORT: 01/23/2022 20:31 ADDENDUM: Critical findings for addendum were reported to nurse practitioner Audrea Muscat at 8:29 p.m. Electronically Signed   By: Thornell Sartorius M.D.   On: 01/23/2022 20:31   Addendum Date: 01/23/2022   ADDENDUM REPORT: 01/23/2022 20:12 ADDENDUM: Upon re-review of CT abdomen and pelvis with contrast performed 02/01/2022, the following findings are noted: A suspected filling defect is present in a right lower lobe segmental pulmonary artery, concerning for pulmonary embolism. CTA chest is recommended for further evaluation. Electronically Signed   By: Thornell Sartorius M.D.   On: 01/23/2022 20:12   Result Date: 01/23/2022 CLINICAL DATA:  Abdominal pain following surgery in [place]. EXAM: CT ABDOMEN AND PELVIS WITH CONTRAST TECHNIQUE: Multidetector CT imaging of the abdomen and pelvis was performed using the standard protocol following bolus administration of intravenous contrast. RADIATION DOSE REDUCTION: This exam was performed according to the departmental dose-optimization program which includes automated exposure control, adjustment of the mA and/or kV according to patient size and/or use of iterative reconstruction technique. CONTRAST:  OMNIPAQUE IOHEXOL 300 MG/ML  SOLN COMPARISON:  None. FINDINGS: Lower chest: No acute abnormality. Hepatobiliary: Hypodense region is seen in the left lobe of the liver adjacent to the falciform ligament, likely representing focal fatty infiltration. There is an ill-defined hypodense region in the anterior right  lobe of the liver at the gallbladder fossa which can not be further characterized on this single-phase exam. The gallbladder is without stones. No biliary ductal dilatation. Pancreas: Unremarkable. No pancreatic ductal dilatation or surrounding inflammatory changes. Spleen: Normal in size without focal abnormality. Adrenals/Urinary Tract: The adrenal glands are within normal limits. A nonobstructive calculus is noted in the lower pole the left kidney. No obstructive uropathy bilaterally. There is bladder wall thickening with surrounding fat stranding which may be related to local inflammatory changes. Stomach/Bowel: The stomach is within normal limits. No bowel obstruction, free air, or pneumatosis. The patient is status post presumed right hemicolectomy with a right lower quadrant ostomy. A few scattered diverticula are noted in the remaining colon. There is mild small bowel wall thickening in the right lower quadrant which is likely related to local inflammatory changes. Vascular/Lymphatic: A filling defect is present in the common and external iliac veins on the left and common femoral vein on the right., compatible with deep venous thrombosis. The aorta is normal in caliber. No definite abdominal or pelvic lymphadenopathy by size criteria. Reproductive: Not well seen due to inflammatory changes and lack of enteric contrast. Per PA Delsa Sale, status post hysterectomy. Other: A rim enhancing collection is noted in the pelvis in the left lower quadrant measuring 7.1 x 6.0 x 4.9 cm, concerning for abscess. There is a linear rim enhancing hypodense collection in the pelvis in the region of prior  surgery best seen on coronal image 47. Fat stranding and a small amount of free fluid are noted in the pelvis. Musculoskeletal: No acute osseous abnormality. IMPRESSION: 1. Findings compatible with deep venous thrombosis involving the common and external iliac veins on the left and common femoral vein on the right. 2.  Status post surgery, possible hysterectomy per PA [facility]. There is a rim enhancing collection containing air in the pelvis on the left measuring 7.1 x 6.0 x 4.9 cm, likely abscess. There is a linear hypodense collection with rim enhancement and air in the lower abdomen measuring 7.6 x 0.4 x 0.8 cm, possible abscess. Surgical consultation is recommended. 3. Bladder wall thickening with surrounding fat stranding, possible infectious or inflammatory cystitis. 4. Small hypodense collection in the low anterior abdominal wall measuring 1.2 x 1.1 x 1.0 cm in the region of prior surgery, possible phlegmon versus small abscess. 5. Status post right hemicolectomy with right lower quadrant ostomy. No bowel obstruction or free air. There is mild thickening of a few loops of small bowel in the pelvis on the right which is likely related to local inflammatory changes. 6. Left renal calculus. 7. Remaining findings as described above. Critical findings were reported to PA Soto at 9:39 p.m. Electronically Signed: By: Thornell Sartorius M.D. On: 01/22/2022 21:54   CT IMAGE GUIDED DRAINAGE BY PERCUTANEOUS CATHETER  Result Date: 01/23/2022 CLINICAL DATA:  Post hysterectomy and partial bowel resection at outside institution. Complex 7.1 cm left pelvic collection. Abdominal pain. EXAM: CT GUIDED DRAINAGE OF PELVIC ABSCESS ANESTHESIA/SEDATION: Lidocaine 1% subcutaneous. Moderate sedation was not utilized because of hypotension. PROCEDURE: The procedure, risks, benefits, and alternatives were explained to the patient. Questions regarding the procedure were encouraged and answered. The patient understands and consents to the procedure. Select axial scans through the pelvis were obtained. The complex left extraperitoneal collection was localized and an appropriate skin entry site was determined and marked. The operative field was prepped with chlorhexidinein a sterile fashion, and a sterile drape was applied covering the operative field.  A sterile gown and sterile gloves were used for the procedure. Local anesthesia was provided with 1% Lidocaine. Under CT fluoroscopic guidance, a 19 gauge percutaneous entry needle advanced into the collection. Bloody fluid could be aspirated. Amplatz guidewire advanced easily within the collection, position confirmed on CT. Tract dilated to facilitate placement of 12 French pigtail drain catheter, placed centrally within the collection. A relatively small volume of bloody fluid could be aspirated. Given concerns this could represent infected hematoma, the catheter was left in place, secured externally with 0 Prolene suture and StatLock, and placed to suction bulb drainage. The aspirate was sent for Gram stain and culture. The patient tolerated the procedure well. COMPLICATIONS: None immediate FINDINGS: Complex left extraperitoneal fluid collection localized. 12 French pigtail drain catheter placed as above. Small volume bloody aspirate sent for Gram stain and culture. IMPRESSION: 1. Technically successful CT-guided left pelvic abscess drain catheter placement. RADIATION DOSE REDUCTION: This exam was performed according to the departmental dose-optimization program which includes automated exposure control, adjustment of the mA and/or kV according to patient size and/or use of iterative reconstruction technique. Electronically Signed   By: Corlis Leak M.D.   On: 01/23/2022 16:18    Labs:  CBC: Recent Labs    01/23/22 0337 01/24/22 0046 01/25/22 0051 01/26/22 0057  WBC 4.5 4.9 4.4 4.4  HGB 12.6 12.0 11.8* 12.6  HCT 38.1 36.3 35.1* 38.6  PLT 144* 143* 172 220  COAGS: No results for input(s): INR, APTT in the last 8760 hours.  BMP: Recent Labs    04/15/21 1102 01/22/22 1739 01/23/22 0337 01/24/22 0046  NA 137 135 137 135  K 3.6 4.0 3.7 3.7  CL 106 102 107 106  CO2 26 24 21* 20*  GLUCOSE 87 96 93 89  BUN 9 9 8  5*  CALCIUM 9.3 9.4 9.1 8.8*  CREATININE 0.48 0.77 0.69 0.74  GFRNONAA  >60 >60 >60 >60     LIVER FUNCTION TESTS: Recent Labs    04/15/21 1102 01/22/22 1739 01/23/22 0337  BILITOT 0.5 0.4 0.5  AST 21 19 16   ALT 20 17 19   ALKPHOS 63 158* 142*  PROT 7.3 8.1 7.1  ALBUMIN 4.1 3.9 3.5     Assessment and Plan:   CT-guided left pelvic abscess drain catheter placement 01/23/22.  Insertion site with probable superficial skin bleed, intermittently oozing secondary to heparin gtt. Transitioning to DOAC today, expect oozing to stop once heparin flushed out. Cx negative growth so far, given appearance, could be old liquefying hematoma. Expected discharge tomorrow. Will plan outpt follow up, IR clinic will contact pt to schedule.   Electronically Signed: Brayton El, PA-C 01/26/2022, 1:16 PM   I spent a total of 15 Minutes at the the patient's bedside AND on the patient's hospital floor or unit, greater than 50% of which was counseling/coordinating care for left pelvic abscess drain placement.

## 2022-01-26 NOTE — Progress Notes (Signed)
Pharmacy Antibiotic Note  Maria Riley is a 31 y.o. female admitted on 01/22/2022 with  CT concerning for intra-abdominal abscess .  Pharmacy has been consulted for Zosyn dosing.  Pt with L pelvic abscess drain catheter placed 2/17. WBC wnl. Afeb.   Plan: Zosyn 3.375g IV q8h (4 hour infusion). Will continue to f/u micro data, pt's clinical condition, and renal function  Height: 4\' 11"  (149.9 cm) Weight: 66.2 kg (146 lb) IBW/kg (Calculated) : 43.2  Temp (24hrs), Avg:98.3 F (36.8 C), Min:97.8 F (36.6 C), Max:98.7 F (37.1 C)  Recent Labs  Lab 01/22/22 1739 01/23/22 0337 01/24/22 0046 01/25/22 0051 01/26/22 0057  WBC 5.3 4.5 4.9 4.4 4.4  CREATININE 0.77 0.69 0.74  --   --      Estimated Creatinine Clearance: 85.8 mL/min (by C-G formula based on SCr of 0.74 mg/dL).    Allergies  Allergen Reactions   Tape     Adhesive tape- rash     Zosyn 2/17 >>  2/17 Abscess: Rare staph haemolyticus 2/17 BCx: NGTD  2/16 UCx: multiple species, suggest recollection 2/16 COVID/Flu A&B: negative  Thank you for allowing pharmacy to be a part of this patients care.  3/16, PharmD, BCPS Please see amion for complete clinical pharmacist phone list 01/26/2022 7:42 AM

## 2022-01-27 ENCOUNTER — Other Ambulatory Visit (HOSPITAL_COMMUNITY): Payer: Self-pay

## 2022-01-27 ENCOUNTER — Other Ambulatory Visit: Payer: Self-pay | Admitting: Surgery

## 2022-01-27 DIAGNOSIS — N739 Female pelvic inflammatory disease, unspecified: Secondary | ICD-10-CM | POA: Diagnosis not present

## 2022-01-27 LAB — CBC
HCT: 36.9 % (ref 36.0–46.0)
Hemoglobin: 12.1 g/dL (ref 12.0–15.0)
MCH: 29.7 pg (ref 26.0–34.0)
MCHC: 32.8 g/dL (ref 30.0–36.0)
MCV: 90.7 fL (ref 80.0–100.0)
Platelets: 219 10*3/uL (ref 150–400)
RBC: 4.07 MIL/uL (ref 3.87–5.11)
RDW: 14.6 % (ref 11.5–15.5)
WBC: 4.6 10*3/uL (ref 4.0–10.5)
nRBC: 0 % (ref 0.0–0.2)

## 2022-01-27 MED ORDER — DOXYCYCLINE HYCLATE 100 MG PO TABS
100.0000 mg | ORAL_TABLET | Freq: Two times a day (BID) | ORAL | 0 refills | Status: AC
Start: 2022-01-27 — End: 2022-02-06
  Filled 2022-01-27: qty 20, 10d supply, fill #0

## 2022-01-27 MED ORDER — DOXYCYCLINE HYCLATE 100 MG PO TABS
100.0000 mg | ORAL_TABLET | Freq: Two times a day (BID) | ORAL | Status: DC
Start: 2022-01-27 — End: 2022-01-27
  Administered 2022-01-27: 100 mg via ORAL
  Filled 2022-01-27: qty 1

## 2022-01-27 NOTE — Discharge Summary (Addendum)
Discharge Summary  BETTEJANE Riley OZH:[id number] DOB: 07-10-1991  PCP: Maria Riley, No Pcp Per (Inactive)  Admit date: Maria/16/2023 Discharge date: Maria/21/2023  Time spent: 35 minutes.  Recommendations for Outpatient Follow-up:  Follow-up with your primary care provider within Maria week. Follow-up with your gynecologist in 1 to Maria weeks. Follow-up with general surgery in 1 to Maria weeks. Follow instructions as recommended by interventional radiology. Take your medications as prescribed.  Interventional radiology recommended once daily flushing of drain catheter at home.  IR clinic will contact Maria Riley to schedule Maria follow-up appointment.  Per general surgery: The Maria Riley will be scheduled to discuss ileostomy reversal in about 3 months. Maria Riley will need to have Maria PCP refer Maria Riley according to office staff since our surgeons did not operate on Maria Riley acutely.  Maria Riley should also try to obtain Maria Riley medical records from the hospital in [place] if able.  General surgery will put in Maria referral to the ostomy clinic.     Discharge Diagnoses:  Active Hospital Problems   Diagnosis Date Noted   Pelvic abscess in female 01/22/2022   Stillborn, abnormal 01/26/2022   Ileostomy in place [facility]) 01/26/2022   UTI (urinary tract infection) 01/23/2022   Acute deep vein thrombosis (DVT) of iliac vein of left lower extremity (HCC) 01/22/2022   Acute deep vein thrombosis (DVT) of femoral vein of right lower extremity (HCC) 01/22/2022   MDD (major depressive disorder), recurrent episode, severe (HCC) 07/08/2018    Resolved Hospital Problems  No resolved problems to display.    Discharge Condition: Stable.  Diet recommendation: Resume previous diet.  Vitals:   01/27/22 0440 01/27/22 0807  BP: 92/62 100/70  Pulse: 89 78  Resp: 17 18  Temp: 98.3 F (36.8 C) 98.8 F (37.1 C)  SpO2: 99% 98%    History of present illness:   Maria Riley is Maria 31 y.o. female who recently had Maria emergent C-section in January 2023 in  [place] resulting in postoperative complications that resulted in right hemicolectomy with ostomy, hysterectomy.  Maria Riley has 3 children, Maria Riley 50 years old, Maria Riley 75 and 30 years old.  The Maria Riley presented with several weeks of abdominal pain found to have pelvic abscess and acute DVT involving Maria Riley lower extremities bilaterally, seen on CT abdomen and pelvis with contrast Maria/16/23.  Maria Riley received empiric IV antibiotics and was started on heparin drip.   Significant Hospital events: Maria/16>> admit to [facility] for pelvic abscess and bilateral lower extremity DVT.   Significant imaging studies: Maria/16>> CT abdomen/pelvis: Multiple pelvic/abdominal abscesses.  DVT involving common/external iliac veins in the left and common femoral vein on the right.   Significant microbiology data: Maria/16>> COVID/influenza PCR: Negative Maria/16>> urine culture: Suggest recollection. Maria/17>> blood culture: Negative to date. Maria/17/2023: Abscess culture grew Staphylococcus haemolyticus resistant to ciprofloxacin, clindamycin, erythromycin, gentamicin, oxacillin, rifampin.  Switched to p.o. doxycycline on Maria/21/2023 after results of sensitivities. Maria/20/23: General surgery signed off, will follow outpatient.  IR will also follow outpatient.   Procedures:  CT L pelvic abscess drain placement by Dr. Deanne Coffer on Maria/17.   Consults:  [facility] IR   Subjective: Maria/21/2023: Maria Riley was seen and examined at Maria Riley bedside.  Maria Riley was interviewed using video interpreter Lorenzo ID number [id number].  Maria Riley has no new complaints.  Maria Riley is eager to go home.    Advised on the importance of following up with Maria primary care provider, general surgery, and interventional radiology.  Also advised on the importance of flushing Maria Riley drain once Maria  day as recommended by IR, and taking Maria Riley medications as prescribed.  Maria Riley understands and agrees with the plan.    Hospital Course:  Principal Problem:   Pelvic abscess in female Active Problems:   MDD (major depressive  disorder), recurrent episode, severe (HCC)   Acute deep vein thrombosis (DVT) of iliac vein of left lower extremity (HCC)   Acute deep vein thrombosis (DVT) of femoral vein of right lower extremity (HCC)   UTI (urinary tract infection)   Ileostomy in place [facility])   Stillborn, abnormal  Pelvic abscess seen on CT scan abdomen pelvis with contrast Maria/16/23:  Likely Maria sequelae of Maria Riley recent C-section/hemicolectomy- Received 5 days of IV Zosyn.  Switched to Augmentin x5 days on Maria/20/2023. Abscess culture obtained on Maria/17/2023 grew Staphylococcus haemolyticus resistant to ciprofloxacin, clindamycin, erythromycin, gentamicin, oxacillin, rifampin.   Switched to p.o. doxycycline on Maria/21/2023 after results of sensitivities returned. Blood cultures negative to date. Post drain placement by IR on Maria/17/2023.  Recommended daily flushing. Follow-up with general surgery and interventional radiology outpatient.   Acute bilateral lower extremity DVT seen on CT AP scan done on Maria/16/2023: Switched from heparin drip to Eliquis on Maria/20/2023, no additional surgery planned by general surgery or IR. Will need 3 to 6 months of oral anticoagulation. Follow-up with your primary care provider Repeat bilateral lower extremity imaging to verify resolution of bilateral lower extremity DVT. Maria Riley advised on the importance of compliance with Maria Riley oral anticoagulation, Maria Riley understands and agrees with plan   Recent pregnancy resulting in emergent C-section, 1 month status post multiple abdominal surgeries, right hemicolectomy and end ileostomy- Per Maria Riley-this was due to numerous placental issues Maria Riley has 3 children, Maria Riley 60 years old, Maria Riley 36 and 30 years old. Maria Riley will follow up with general surgery outpatient for possible reversal of ileostomy. Follow-up with your gynecologist outpatient.   Mild non-anion gap metabolic acidosis Serum bicarb 20, anion gap 9 Tolerating Maria diet well.   Resolved mild  thrombocytopenia, secondary to acute illness Platelet count has normalized 219.   BMI: Estimated body mass index is 29.49 kg/m as calculated from the following:   Height as of this encounter: 4\' 11"  (1.499 m).   Weight as of this encounter: 66.Maria kg.    Code status:   Code Status: Full Code    DVT Prophylaxis: IV heparin, switched to Eliquis Maria/20/23.   Family Communication: None at bedside     Disposition Plan: Status is: Inpatient Remains inpatient appropriate because: Multiple pelvic abscesses-awaiting drain placement by IR-on IV antibiotics.     Planned Discharge Destination: Home Maria/21/23    Procedures:  Post drain placement by IR on Maria/17/2023.    Consultations: General surgery Intervention radiology.  Discharge Exam: BP 100/70 (BP Location: Left Arm)    Pulse 78    Temp 98.8 F (37.1 C) (Oral)    Resp 18    Ht 4\' 11"  (1.499 m)    Wt 66.Maria kg    LMP 06/08/2021 (Approximate)    SpO2 98%    BMI 29.49 kg/m  General: 31 y.o. year-old female well developed well nourished in no acute distress.  Alert and oriented x3. Cardiovascular: Regular rate and rhythm with no rubs or gallops.  No thyromegaly or JVD noted.   Respiratory: Clear to auscultation with no wheezes or rales. Good inspiratory effort. Abdomen: Soft nontender nondistended with normal bowel sounds x4 quadrants.  Ostomy bag in place on right quadrant.  Abdominal drain in place on left quadrant. Musculoskeletal:  No lower extremity edema bilaterally. Psychiatry: Mood is appropriate for condition and setting  Discharge Instructions You were cared for by Maria hospitalist during your hospital stay. If you have any questions about your discharge medications or the care you received while you were in the hospital after you are discharged, you can call the unit and asked to speak with the hospitalist on call if the hospitalist that took care of you is not available. Once you are discharged, your primary care physician will handle  any further medical issues. Please note that NO REFILLS for any discharge medications will be authorized once you are discharged, as it is imperative that you return to your primary care physician (or establish Maria relationship with Maria primary care physician if you do not have one) for your aftercare needs so that they can reassess your need for medications and monitor your lab values.  Discharge Instructions     Amb Referral to Ostomy Clinic   Complete by: As directed    Reason for referral modifiers:  Pre and post-operative counseling for ostomy management Information about lifestyle, intimacy, diet and clothing options        Allergies as of Maria/21/2023       Reactions   Tape    Adhesive tape- rash        Medication List     STOP taking these medications    butalbital-acetaminophen-caffeine 50-325-40 MG tablet Commonly known as: FIORICET   glycopyrrolate 1 MG tablet Commonly known as: Robinul   ondansetron 8 MG disintegrating tablet Commonly known as: ZOFRAN-ODT   oseltamivir 75 MG capsule Commonly known as: TAMIFLU   Prenatal Complete 14-0.4 MG Tabs       TAKE these medications    CertaVite/Antioxidants Tabs Tome 1 tableta por va oral diariamente. (Take 1 tablet by mouth daily.)   doxycycline 100 MG tablet Commonly known as: VIBRA-TABS Take 1 tablet (100 mg total) by mouth every 12 (twelve) hours for 10 days.   Eliquis DVT/PE Starter Pack Generic drug: Apixaban Starter Pack (10mg  and 5mg ) Take as directed on package: start with two-5mg  tablets by mouth twice daily for 7 days. On day 8, switch to one-5mg  tablet twice daily.   apixaban 5 MG Tabs tablet Commonly known as: ELIQUIS Take 1 tablet (5 mg total) by mouth Maria (two) times daily. Start taking on: February 22, 2022   oxyCODONE 5 MG immediate release tablet Commonly known as: Oxy IR/ROXICODONE Take 1 tablet (5 mg total) by mouth every 6 (six) hours as needed for up to 3 days for moderate pain. What  changed: when to take this       Allergies  Allergen Reactions   Tape     Adhesive tape- rash    Follow-up Information     , MD Follow up.   Specialties: Interventional Radiology, Radiology Contact information: [address] [place] [phone]         Surgery, [place]. Schedule an appointment as soon as possible for Maria visit in 3 month(s).   Specialty: General Surgery Why: Llame o pdale Maria su proveedor de atencin primaria que le ayude Maria programar una cita con un cirujano colorrectal en aproximadamente 3 meses para hablar sobre la reversin de la ileostoma. Intente obtener registros quirrgicos del hospital en [place]. Contact information: [address] [place] [phone]         [facility]. Call today.  Why: Please call for Maria posthospital follow-up appointment. Contact information: [address] [place] [address] [phone]        Claiborne Rigg, NP Follow up.   Specialty: Nurse Practitioner Why: TIME : 9:30 AN DATE: MARCH 27 , 2023 Claiborne Rigg ,NP NEW LOCATION : [address] Contact information: [address] [place] [address] [phone]                  The results of significant diagnostics from this hospitalization (including imaging, microbiology, ancillary and laboratory) are listed below for reference.    Significant Diagnostic Studies: CT Angio Chest Pulmonary Embolism (PE) W or WO Contrast  Addendum Date: Maria/17/2023   ADDENDUM REPORT: 01/23/2022 23:42 ADDENDUM: Critical findings were reported to NP Audrea Muscat at 11:41 p.m. Electronically Signed   By: Thornell Sartorius M.D.   On: 01/23/2022 23:42   Result Date: Maria/17/2023 CLINICAL DATA:  Pulmonary embolism suspected, positive D-dimer. Known bilateral DVTs. EXAM: CT ANGIOGRAPHY CHEST WITH CONTRAST TECHNIQUE: Multidetector  CT imaging of the chest was performed using the standard protocol during bolus administration of intravenous contrast. Multiplanar CT image reconstructions and MIPs were obtained to evaluate the vascular anatomy. RADIATION DOSE REDUCTION: This exam was performed according to the departmental dose-optimization program which includes automated exposure control, adjustment of the mA and/or kV according to Maria Riley size and/or use of iterative reconstruction technique. CONTRAST:  14mL OMNIPAQUE IOHEXOL 350 MG/ML SOLN COMPARISON:  01/22/2022. FINDINGS: Cardiovascular: The heart is normal in size and there is no pericardial effusion. The aorta and pulmonary trunk are normal in caliber. Filling defects are noted in the left pulmonary artery extending into the left upper and lower lobe lobar and segmental arteries. Segmental and subsegmental pulmonary artery filling defects are present in the right lower lobe. No evidence of right heart strain. Mediastinum/Nodes: No enlarged mediastinal, hilar, or axillary lymph nodes. Thyroid gland, trachea, and esophagus demonstrate no significant findings. Lungs/Pleura: Lungs are clear. No pleural effusion or pneumothorax. Upper Abdomen: No acute abnormality. Musculoskeletal: No chest wall abnormality. No acute or significant osseous findings. Review of the MIP images confirms the above findings. IMPRESSION: Bilateral pulmonary emboli, moderate thrombus burden. No evidence of right heart strain. Electronically Signed: By: Thornell Sartorius M.D. On: 01/23/2022 23:37   CT ABDOMEN PELVIS W CONTRAST  Addendum Date: Maria/17/2023   ADDENDUM REPORT: 01/23/2022 20:31 ADDENDUM: Critical findings for addendum were reported to nurse practitioner Audrea Muscat at 8:29 p.m. Electronically Signed   By: Thornell Sartorius M.D.   On: 01/23/2022 20:31   Addendum Date: Maria/17/2023   ADDENDUM REPORT: 01/23/2022 20:12 ADDENDUM: Upon re-review of CT abdomen and pelvis with contrast performed 02/01/2022, the  following findings are noted: Maria suspected filling defect is present in Maria right lower lobe segmental pulmonary artery, concerning for pulmonary embolism. CTA chest is recommended for further evaluation. Electronically Signed   By: Thornell Sartorius M.D.   On: 01/23/2022 20:12   Result Date: Maria/17/2023 CLINICAL DATA:  Abdominal pain following surgery in [place]. EXAM: CT ABDOMEN AND PELVIS WITH CONTRAST TECHNIQUE: Multidetector CT imaging of the abdomen and pelvis was performed using the standard protocol following bolus administration of intravenous contrast. RADIATION DOSE REDUCTION: This exam was performed according to the departmental dose-optimization program which includes automated exposure control, adjustment of the mA and/or kV according to Maria Riley size and/or use of iterative reconstruction technique. CONTRAST:  OMNIPAQUE IOHEXOL 300 MG/ML  SOLN COMPARISON:  None. FINDINGS: Lower chest:  No acute abnormality. Hepatobiliary: Hypodense region is seen in the left lobe of the liver adjacent to the falciform ligament, likely representing focal fatty infiltration. There is an ill-defined hypodense region in the anterior right lobe of the liver at the gallbladder fossa which can not be further characterized on this single-phase exam. The gallbladder is without stones. No biliary ductal dilatation. Pancreas: Unremarkable. No pancreatic ductal dilatation or surrounding inflammatory changes. Spleen: Normal in size without focal abnormality. Adrenals/Urinary Tract: The adrenal glands are within normal limits. Maria nonobstructive calculus is noted in the lower pole the left kidney. No obstructive uropathy bilaterally. There is bladder wall thickening with surrounding fat stranding which may be related to local inflammatory changes. Stomach/Bowel: The stomach is within normal limits. No bowel obstruction, free air, or pneumatosis. The Maria Riley is status post presumed right hemicolectomy with Maria right lower quadrant ostomy.  Maria few scattered diverticula are noted in the remaining colon. There is mild small bowel wall thickening in the right lower quadrant which is likely related to local inflammatory changes. Vascular/Lymphatic: Maria filling defect is present in the common and external iliac veins on the left and common femoral vein on the right., compatible with deep venous thrombosis. The aorta is normal in caliber. No definite abdominal or pelvic lymphadenopathy by size criteria. Reproductive: Not well seen due to inflammatory changes and lack of enteric contrast. Per PA Delsa Sale, status post hysterectomy. Other: Maria rim enhancing collection is noted in the pelvis in the left lower quadrant measuring 7.1 x 6.0 x 4.9 cm, concerning for abscess. There is Maria linear rim enhancing hypodense collection in the pelvis in the region of prior surgery best seen on coronal image 47. Fat stranding and Maria small amount of free fluid are noted in the pelvis. Musculoskeletal: No acute osseous abnormality. IMPRESSION: 1. Findings compatible with deep venous thrombosis involving the common and external iliac veins on the left and common femoral vein on the right. Maria. Status post surgery, possible hysterectomy per PA [facility]. There is Maria rim enhancing collection containing air in the pelvis on the left measuring 7.1 x 6.0 x 4.9 cm, likely abscess. There is Maria linear hypodense collection with rim enhancement and air in the lower abdomen measuring 7.6 x 0.4 x 0.8 cm, possible abscess. Surgical consultation is recommended. 3. Bladder wall thickening with surrounding fat stranding, possible infectious or inflammatory cystitis. 4. Small hypodense collection in the low anterior abdominal wall measuring 1.Maria x 1.1 x 1.0 cm in the region of prior surgery, possible phlegmon versus small abscess. 5. Status post right hemicolectomy with right lower quadrant ostomy. No bowel obstruction or free air. There is mild thickening of Maria few loops of small bowel in the pelvis on  the right which is likely related to local inflammatory changes. 6. Left renal calculus. 7. Remaining findings as described above. Critical findings were reported to PA Soto at 9:39 p.m. Electronically Signed: By: Thornell Sartorius M.D. On: 01/22/2022 21:54   CT IMAGE GUIDED DRAINAGE BY PERCUTANEOUS CATHETER  Result Date: Maria/17/2023 CLINICAL DATA:  Post hysterectomy and partial bowel resection at outside institution. Complex 7.1 cm left pelvic collection. Abdominal pain. EXAM: CT GUIDED DRAINAGE OF PELVIC ABSCESS ANESTHESIA/SEDATION: Lidocaine 1% subcutaneous. Moderate sedation was not utilized because of hypotension. PROCEDURE: The procedure, risks, benefits, and alternatives were explained to the Maria Riley. Questions regarding the procedure were encouraged and answered. The Maria Riley understands and consents to the procedure. Select axial scans through the pelvis were obtained. The complex left  extraperitoneal collection was localized and an appropriate skin entry site was determined and marked. The operative field was prepped with chlorhexidinein Maria sterile fashion, and Maria sterile drape was applied covering the operative field. Maria sterile gown and sterile gloves were used for the procedure. Local anesthesia was provided with 1% Lidocaine. Under CT fluoroscopic guidance, Maria 19 gauge percutaneous entry needle advanced into the collection. Bloody fluid could be aspirated. Amplatz guidewire advanced easily within the collection, position confirmed on CT. Tract dilated to facilitate placement of 12 French pigtail drain catheter, placed centrally within the collection. Maria relatively small volume of bloody fluid could be aspirated. Given concerns this could represent infected hematoma, the catheter was left in place, secured externally with 0 Prolene suture and StatLock, and placed to suction bulb drainage. The aspirate was sent for Gram stain and culture. The Maria Riley tolerated the procedure well. COMPLICATIONS: None immediate  FINDINGS: Complex left extraperitoneal fluid collection localized. 12 French pigtail drain catheter placed as above. Small volume bloody aspirate sent for Gram stain and culture. IMPRESSION: 1. Technically successful CT-guided left pelvic abscess drain catheter placement. RADIATION DOSE REDUCTION: This exam was performed according to the departmental dose-optimization program which includes automated exposure control, adjustment of the mA and/or kV according to Maria Riley size and/or use of iterative reconstruction technique. Electronically Signed   By: Corlis Leak M.D.   On: 01/23/2022 16:18    Microbiology: Recent Results (from the past 240 hour(s))  Urine Culture     Status: Abnormal   Collection Time: 01/22/22  6:16 PM   Specimen: Urine, Clean Catch  Result Value Ref Range Status   Specimen Description URINE, CLEAN CATCH  Final   Special Requests   Final    NONE Performed at [facility], [address], [place], [place] [address]    Culture MULTIPLE SPECIES PRESENT, SUGGEST RECOLLECTION (Maria)  Final   Report Status 01/24/2022 FINAL  Final  Resp Panel by RT-PCR (Flu Maria&B, Covid) Nasopharyngeal Swab     Status: None   Collection Time: 01/22/22 10:35 PM   Specimen: Nasopharyngeal Swab; Nasopharyngeal(NP) swabs in vial transport medium  Result Value Ref Range Status   SARS Coronavirus Maria by RT PCR NEGATIVE NEGATIVE Final    Comment: (NOTE) SARS-CoV-Maria target nucleic acids are NOT DETECTED.  The SARS-CoV-Maria RNA is generally detectable in upper respiratory specimens during the acute phase of infection. The lowest concentration of SARS-CoV-Maria viral copies this assay can detect is 138 copies/mL. Maria negative result does not preclude SARS-Cov-Maria infection and should not be used as the sole basis for treatment or other Maria Riley management decisions. Maria negative result may occur with  improper specimen collection/handling, submission of specimen other than nasopharyngeal swab, presence of viral  mutation(s) within the areas targeted by this assay, and inadequate number of viral copies(<138 copies/mL). Maria negative result must be combined with clinical observations, Maria Riley history, and epidemiological information. The expected result is Negative.  Fact Sheet for Patients:  BloggerCourse.com  Fact Sheet for Healthcare Providers:  SeriousBroker.it  This test is no t yet approved or cleared by the [place] FDA and  has been authorized for detection and/or diagnosis of SARS-CoV-Maria by FDA under an Emergency Use Authorization (EUA). This EUA will remain  in effect (meaning this test can be used) for the duration of the COVID-19 declaration under Section 564(b)(1) of the Act, 21 U.S.C.section 360bbb-3(b)(1), unless the authorization is terminated  or revoked sooner.       Influenza Maria by  PCR NEGATIVE NEGATIVE Final   Influenza B by PCR NEGATIVE NEGATIVE Final    Comment: (NOTE) The Xpert Xpress SARS-CoV-Maria/FLU/RSV plus assay is intended as an aid in the diagnosis of influenza from Nasopharyngeal swab specimens and should not be used as Maria sole basis for treatment. Nasal washings and aspirates are unacceptable for Xpert Xpress SARS-CoV-Maria/FLU/RSV testing.  Fact Sheet for Patients: BloggerCourse.comhttps://www.fda.gov/media/152166/download  Fact Sheet for Healthcare Providers: SeriousBroker.ithttps://www.fda.gov/media/152162/download  This test is not yet approved or cleared by the [place] FDA and has been authorized for detection and/or diagnosis of SARS-CoV-Maria by FDA under an Emergency Use Authorization (EUA). This EUA will remain in effect (meaning this test can be used) for the duration of the COVID-19 declaration under Section 564(b)(1) of the Act, 21 U.S.C. section 360bbb-3(b)(1), unless the authorization is terminated or revoked.  Performed at [facility], [address] [place], [place] [id number]   Culture, blood (routine x Maria)      Status: None (Preliminary result)   Collection Time: 01/23/22 12:15 AM   Specimen: BLOOD  Result Value Ref Range Status   Specimen Description BLOOD SITE NOT SPECIFIED  Final   Special Requests   Final    BOTTLES DRAWN AEROBIC AND ANAEROBIC Blood Culture adequate volume   Culture   Final    NO GROWTH 4 DAYS Performed at [facility], [address] [place], [place] [address]    Report Status PENDING  Incomplete  Culture, blood (routine x Maria)     Status: None (Preliminary result)   Collection Time: 01/23/22 12:26 AM   Specimen: BLOOD  Result Value Ref Range Status   Specimen Description BLOOD SITE NOT SPECIFIED  Final   Special Requests   Final    BOTTLES DRAWN AEROBIC AND ANAEROBIC Blood Culture adequate volume   Culture   Final    NO GROWTH 4 DAYS Performed at [facility], [address] [place], [place] [address]    Report Status PENDING  Incomplete  Aerobic/Anaerobic Culture w Gram Stain (surgical/deep wound)     Status: None (Preliminary result)   Collection Time: 01/23/22  3:40 PM   Specimen: Abscess  Result Value Ref Range Status   Specimen Description ABSCESS  Final   Special Requests NONE  Final   Gram Stain   Final    FEW WBC PRESENT,BOTH PMN AND MONONUCLEAR NO ORGANISMS SEEN Performed at [facility], [address] [place], [place] [address]    Culture   Final    RARE STAPHYLOCOCCUS HAEMOLYTICUS NO ANAEROBES ISOLATED; CULTURE IN PROGRESS FOR 5 DAYS    Report Status PENDING  Incomplete   Organism ID, Bacteria STAPHYLOCOCCUS HAEMOLYTICUS  Final      Susceptibility   Staphylococcus haemolyticus - MIC*    CIPROFLOXACIN >=8 RESISTANT Resistant     ERYTHROMYCIN >=8 RESISTANT Resistant     GENTAMICIN >=16 RESISTANT Resistant     OXACILLIN >=4 RESISTANT Resistant     TETRACYCLINE Maria SENSITIVE Sensitive     VANCOMYCIN 1 SENSITIVE Sensitive     TRIMETH/SULFA <=10 SENSITIVE Sensitive     CLINDAMYCIN >=8 RESISTANT Resistant     RIFAMPIN >=32  RESISTANT Resistant     Inducible Clindamycin NEGATIVE Sensitive     * RARE STAPHYLOCOCCUS HAEMOLYTICUS     Labs: Basic Metabolic Panel: Recent Labs  Lab 01/22/22 1739 01/23/22 0337 01/24/22 0046  NA 135 137 135  K 4.0 3.7 3.7  CL 102 107 106  CO2 24  21* 20*  GLUCOSE 96 93 89  BUN 9 8 5*  CREATININE 0.77 0.69 0.74  CALCIUM 9.4 9.1 8.8*   Liver Function Tests: Recent Labs  Lab 01/22/22 1739 01/23/22 0337  AST 19 16  ALT 17 19  ALKPHOS 158* 142*  BILITOT 0.4 0.5  PROT 8.1 7.1  ALBUMIN 3.9 3.5   Recent Labs  Lab 01/22/22 1739  LIPASE 43   No results for input(s): AMMONIA in the last 168 hours. CBC: Recent Labs  Lab 01/22/22 1739 01/23/22 0337 01/24/22 0046 01/25/22 0051 01/26/22 0057 01/27/22 0225  WBC 5.3 4.5 4.9 4.4 4.4 4.6  NEUTROABS 3.1  --   --   --   --   --   HGB 13.9 12.6 12.0 11.8* 12.6 12.1  HCT 41.Maria 38.1 36.3 35.1* 38.6 36.9  MCV 89.0 89.4 90.3 89.3 90.6 90.7  PLT 171 144* 143* 172 220 219   Cardiac Enzymes: No results for input(s): CKTOTAL, CKMB, CKMBINDEX, TROPONINI in the last 168 hours. BNP: BNP (last 3 results) No results for input(s): BNP in the last 8760 hours.  ProBNP (last 3 results) No results for input(s): PROBNP in the last 8760 hours.  CBG: No results for input(s): GLUCAP in the last 168 hours.     Signed:  Darlin Droparole N Ayla Dunigan, MD [facility] Maria/21/2023, 10:24 AM

## 2022-01-27 NOTE — TOC Progression Note (Signed)
Transition of Care [facility]) - Progression Note    Patient Details  Name: Maria Riley MRN: [id number] Date of Birth: 1991-10-24  Transition of Care [facility]) CM/SW Contact  Nadene Rubins Adria Devon, RN Phone Number: 01/27/2022, 8:59 AM  Clinical Narrative:     Patient being discharged with drain. Bedside nurse to teach drain care. Consult for [facility] follow up. Called [facility] and [facility] , they will discuss with their nurses and call patient back with Appointment        Expected Discharge Plan and Services           Expected Discharge Date: 01/27/22                                     Social Determinants of Health (SDOH) Interventions    Readmission Risk Interventions No flowsheet data found.

## 2022-01-27 NOTE — Progress Notes (Signed)
Discharged patient with instructions and some supplies, verbalized understanding.

## 2022-01-28 ENCOUNTER — Telehealth: Payer: Self-pay

## 2022-01-28 LAB — CULTURE, BLOOD (ROUTINE X 2)
Culture: NO GROWTH
Culture: NO GROWTH
Special Requests: ADEQUATE
Special Requests: ADEQUATE

## 2022-01-28 LAB — AEROBIC/ANAEROBIC CULTURE W GRAM STAIN (SURGICAL/DEEP WOUND)

## 2022-01-28 NOTE — Telephone Encounter (Signed)
Transition Care Management Unsuccessful Follow-up Telephone Call  Date of discharge and from where:  01/27/2022-[place]   Attempts:  1st Attempt  Reason for unsuccessful TCM follow-up call:  Left voice message

## 2022-01-29 ENCOUNTER — Other Ambulatory Visit (HOSPITAL_COMMUNITY): Payer: Self-pay

## 2022-01-29 ENCOUNTER — Telehealth (HOSPITAL_COMMUNITY): Payer: Self-pay

## 2022-01-29 DIAGNOSIS — B3731 Acute candidiasis of vulva and vagina: Secondary | ICD-10-CM | POA: Diagnosis not present

## 2022-01-29 DIAGNOSIS — Z8759 Personal history of other complications of pregnancy, childbirth and the puerperium: Secondary | ICD-10-CM | POA: Diagnosis not present

## 2022-01-29 NOTE — Telephone Encounter (Signed)
Pharmacy Transitions of Care Follow-up Telephone Call  Date of discharge: 01/27/2022  Discharge Diagnosis: Bilateral LE DVT Interpreter #[id number]  How have you been since you were released from the hospital? Patient reports overall doing well  Medication changes made at discharge:  - START: Eliquis Starter Pack  Medication changes verified by the patient? Yes (Yes/No)   Medication Accessibility:  Home Pharmacy: [id number] [place]    Was the patient provided with refills on discharged medications? No   Have all prescriptions been transferred from [facility] to home pharmacy? N/A   Is the patient able to afford medications? Patient has insurance  Medication Review: APIXABAN (ELIQUIS)  Apixaban 10 mg BID initiated on 01/26/2022. Will switch to apixaban 5 mg BID after 7 days  - Discussed importance of taking medication around the same time everyday  - Advised patient of medications to avoid (NSAIDs, ASA)  - Educated that Tylenol (acetaminophen) will be the preferred analgesic to prevent risk of bleeding  - Emphasized importance of monitoring for signs and symptoms of bleeding (abnormal bruising, prolonged bleeding, nose bleeds, bleeding from gums, discolored urine, black tarry stools)  - Advised patient to alert all providers of anticoagulation therapy prior to starting a new medication or having a procedure   Follow-up Appointments:  PCP Hospital f/u appt confirmed? Family Medicine Scheduled to see Gwinda Passe on 02/04/2022 @ 9:30.   If their condition worsens, is the pt aware to call PCP or go to the Emergency Dept.? Yes  Final Patient Assessment: Patient educated on Eliquis, able to verbalize appropriate regimen. She is aware of follow up. No questions or concerns at this time.

## 2022-01-29 NOTE — Telephone Encounter (Signed)
Transition Care Management Unsuccessful Follow-up Telephone Call ° °Date of discharge and from where:  01/27/2022 from [place] ° °Attempts:  2nd Attempt ° °Reason for unsuccessful TCM follow-up call:  Left voice message ° ° ° °

## 2022-01-30 NOTE — Telephone Encounter (Signed)
TOC completed by PharD student

## 2022-02-03 ENCOUNTER — Other Ambulatory Visit: Payer: Self-pay

## 2022-02-03 ENCOUNTER — Ambulatory Visit (HOSPITAL_COMMUNITY)
Admission: RE | Admit: 2022-02-03 | Discharge: 2022-02-03 | Disposition: A | Discharge status: Home / Self Care | Payer: Medicaid Other | Source: Ambulatory Visit | Attending: Nurse Practitioner | Admitting: Nurse Practitioner

## 2022-02-03 DIAGNOSIS — R531 Weakness: Secondary | ICD-10-CM | POA: Diagnosis not present

## 2022-02-03 DIAGNOSIS — L24B1 Irritant contact dermatitis related to digestive stoma or fistula: Secondary | ICD-10-CM

## 2022-02-03 DIAGNOSIS — R52 Pain, unspecified: Secondary | ICD-10-CM | POA: Insufficient documentation

## 2022-02-03 DIAGNOSIS — Z432 Encounter for attention to ileostomy: Secondary | ICD-10-CM | POA: Insufficient documentation

## 2022-02-03 DIAGNOSIS — L259 Unspecified contact dermatitis, unspecified cause: Secondary | ICD-10-CM | POA: Insufficient documentation

## 2022-02-03 DIAGNOSIS — R21 Rash and other nonspecific skin eruption: Secondary | ICD-10-CM | POA: Insufficient documentation

## 2022-02-03 NOTE — Discharge Instructions (Signed)
Switched to 1 piece filtered, convex pouch Added stoma powder and skin prep to peristomal skin Provided samples

## 2022-02-03 NOTE — Progress Notes (Signed)
[facility]   Reason for visit:  Ileostomy care, contact dermatitis, difficulty obtaining supplies. Spouse, Kern Alberta, is present with patient today.  We use the virtual interpreter today to answer questions, perform pouch change and rationale for pouching procedure HPI:  Right hemicolectomy with ileostomy after emergent C section.   ROS  Review of Systems  Constitutional:  Positive for activity change.       Weakness Pain with activity, dull  Gastrointestinal:        RLQ ileostomy  Psychiatric/Behavioral:         Is pleasant.  Here with spouse. Makes eye contact.  Responds appropriately via interpreter.  All other systems reviewed and are negative. Vital signs:  BP (!) 105/55    Pulse 95    Temp 98.8 F (37.1 C)    Resp 17    Ht 4\' 11"  (1.499 m)    Wt 58.1 kg    LMP 06/08/2021 (Approximate)    SpO2 100%    BMI 25.85 kg/m  Exam:  Physical Exam Vitals reviewed.  Constitutional:      Appearance: Normal appearance. She is normal weight.  Abdominal:     Palpations: Abdomen is soft.     Comments: RLQ ileostomy  Skin:    General: Skin is warm and dry.     Findings: Rash present.     Comments: Contact dermatitis to peristomal skin. Has been cutting opening too large.   Neurological:     Mental Status: She is alert and oriented to person, place, and time.  Psychiatric:        Behavior: Behavior normal.        Judgment: Judgment normal.    Stoma type/location:  RLQ ileostomy Stomal assessment/size:  1 " slightly budded, round pink and moist  productive of liquid green effluent Peristomal assessment:  contact dermatitis present circumferentially.  Tender to touch.  Pouch was leaking today when I removed it and they opening was cut too large.   Treatment options for stomal/peristomal skin: stoma powder and skin prep to skin breakdown.  Barrier ring and add 1 piece convex filtered pouch.  Will set up with [facility] for supplies.  Output: liquid green effluent Ostomy  pouching: 1pc.convex with barrier ring, stoma powder and skin prep. Added ostomy belt for added security.  Education provided:  Via interpreter: We discuss twice weekly pouch changes. The breakdown of her skin is from stool sitting on the skin.  We measure the stoma and cut the pouch to fit.     Impression/dx  Ileostomy Contact dermatitis Discussion  Via interpreter, we perform pouch change and demonstrate crusting with powder and skin prep, application of barrier ring, cutting opening to fit and pouch application. We apply a filtered pouch today as she relays that her pouch fills with air and is unpleasant to burp. Finally, I add an ostomy belt for added security.  She is busy and active and I want her to have confidence in her pouch fit.  She understands to empty when 1/3 full.  That she may shower with or without the pouch on.  I emailed her the QR codes for spanish ostomy teaching with 08/09/2021.  I enroll her with secure start as it doesn't appear a WOC nurse was consulted during her surgical stay with ostomy placement.   Plan  She understands she will hear from [facility] about supplies and will call me next week to let me know how pouching is going.  May see next  week if there is a need for ongoing teaching and assistance.     Visit time: 74 minutes.   Maple Hudson FNP-BC

## 2022-02-04 ENCOUNTER — Other Ambulatory Visit (HOSPITAL_COMMUNITY): Payer: Self-pay | Admitting: Interventional Radiology

## 2022-02-04 ENCOUNTER — Encounter (INDEPENDENT_AMBULATORY_CARE_PROVIDER_SITE_OTHER): Payer: Self-pay | Admitting: Primary Care

## 2022-02-04 ENCOUNTER — Ambulatory Visit
Admission: RE | Admit: 2022-02-04 | Discharge: 2022-02-04 | Disposition: A | Discharge status: Home / Self Care | Payer: Medicaid Other | Source: Ambulatory Visit | Attending: Radiology | Admitting: Radiology

## 2022-02-04 ENCOUNTER — Ambulatory Visit
Admission: RE | Admit: 2022-02-04 | Discharge: 2022-02-04 | Disposition: A | Discharge status: Home / Self Care | Payer: Medicaid Other | Source: Ambulatory Visit | Attending: Surgery | Admitting: Surgery

## 2022-02-04 ENCOUNTER — Ambulatory Visit (INDEPENDENT_AMBULATORY_CARE_PROVIDER_SITE_OTHER): Payer: Medicaid Other | Admitting: Primary Care

## 2022-02-04 VITALS — BP 102/73 | HR 103 | Temp 98.3°F | Ht 59.0 in | Wt 132.4 lb

## 2022-02-04 DIAGNOSIS — I878 Other specified disorders of veins: Secondary | ICD-10-CM | POA: Diagnosis not present

## 2022-02-04 DIAGNOSIS — N739 Female pelvic inflammatory disease, unspecified: Secondary | ICD-10-CM

## 2022-02-04 DIAGNOSIS — Z7689 Persons encountering health services in other specified circumstances: Secondary | ICD-10-CM | POA: Diagnosis not present

## 2022-02-04 DIAGNOSIS — Z09 Encounter for follow-up examination after completed treatment for conditions other than malignant neoplasm: Secondary | ICD-10-CM | POA: Diagnosis not present

## 2022-02-04 DIAGNOSIS — R188 Other ascites: Secondary | ICD-10-CM | POA: Diagnosis not present

## 2022-02-04 DIAGNOSIS — K760 Fatty (change of) liver, not elsewhere classified: Secondary | ICD-10-CM | POA: Diagnosis not present

## 2022-02-04 DIAGNOSIS — Z9049 Acquired absence of other specified parts of digestive tract: Secondary | ICD-10-CM | POA: Diagnosis not present

## 2022-02-04 HISTORY — PX: IR RADIOLOGIST EVAL & MGMT: IMG5224

## 2022-02-04 MED ORDER — IOPAMIDOL (ISOVUE-300) INJECTION 61%
100.0000 mL | Freq: Once | INTRAVENOUS | Status: AC | PRN
  Administered 2022-02-04: 100 mL via INTRAVENOUS

## 2022-02-04 NOTE — Progress Notes (Signed)
? ?Chief Complaint: ?The patient is seen in follow up today s/p pelvic abscess with drain placement ? ?History of present illness: ? ?January 2023: emergent c-section with fetal demise, followed by series of surgeries including right hemicolectomy, ileostomy creation, and hysterectomy ?01/22/22- presented to ED for abdominal pain, found to have pelvic abscess ?01/23/22- drain placed by D. Hassell with small amount of blood acquired ? ?Presents to clinic with provided interpreter today feeling tired but not in pain.  No fever, chills, N/V, Reports up to 5cc dark bloody output daily.  She is flushing daily.  She has Gyn f/u appointment tomorrow.  Uncertain of her surgical f/u appt. ? ?Past Medical History:  ?Diagnosis Date  ? Medical history non-contributory   ? ? ?Past Surgical History:  ?Procedure Laterality Date  ? CESAREAN SECTION N/A 06/13/2018  ? Procedure: CESAREAN SECTION;  Surgeon: [place] Bing, MD;  Location: [facility] BIRTHING SUITES;  Service: Obstetrics;  Laterality: N/A;  ? MOUTH SURGERY    ? WISDOM TOOTH EXTRACTION Bilateral   ? ? ?Allergies: ?Tape ? ?Medications: ?Prior to Admission medications   ?Medication Sig Start Date End Date Taking? Authorizing Provider  ?apixaban (ELIQUIS) 5 MG TABS tablet Take 1 tablet (5 mg total) by mouth 2 (two) times daily. 02/22/22 05/23/22  Darlin Drop, DO  ?APIXABAN (ELIQUIS) VTE STARTER PACK (10MG  AND 5MG ) Take as directed on package: start with two-5mg  tablets by mouth twice daily for 7 days. On day 8, switch to one-5mg  tablet twice daily. 01/26/22   , DO  ?doxycycline (VIBRA-TABS) 100 MG tablet Take 1 tablet (100 mg total) by mouth every 12 (twelve) hours for 10 days. 01/27/22 02/06/22  01/29/22, DO  ?Multiple Vitamins-Minerals (CERTAVITE/ANTIOXIDANTS) TABS Take 1 tablet by mouth daily. 01/27/22 04/27/22  01/29/22, DO  ?fluticasone (FLONASE) 50 MCG/ACT nasal spray Place 1 spray into both nostrils daily. 01/02/21 04/25/21  01/04/21, NP  ?LO  LOESTRIN FE 1 MG-10 MCG / 10 MCG tablet Take 1 tablet by mouth daily. 03/06/20 07/06/20  03/08/20, MD  ?medroxyPROGESTERone (DEPO-PROVERA) 150 MG/ML injection Inject 1 mL (150 mg total) into the muscle every 3 (three) months. ?Patient not taking: Reported on 11/14/2018 07/18/18 07/06/20  09/17/18, MD  ?  ? ?Family History  ?Problem Relation Age of Onset  ? Healthy Father   ? Alcohol abuse Neg Hx   ? Arthritis Neg Hx   ? Asthma Neg Hx   ? Birth defects Neg Hx   ? Cancer Neg Hx   ? COPD Neg Hx   ? Depression Neg Hx   ? Diabetes Neg Hx   ? Drug abuse Neg Hx   ? Early death Neg Hx   ? Hearing loss Neg Hx   ? Heart disease Neg Hx   ? Hyperlipidemia Neg Hx   ? Hypertension Neg Hx   ? Kidney disease Neg Hx   ? Learning disabilities Neg Hx   ? Mental illness Neg Hx   ? Mental retardation Neg Hx   ? Miscarriages / Stillbirths Neg Hx   ? Stroke Neg Hx   ? Vision loss Neg Hx   ? Varicose Veins Neg Hx   ? ? ?Social History  ? ?Socioeconomic History  ? Marital status: Single  ?  Spouse name: Not on file  ? Number of children: Not on file  ? Years of education: Not on file  ? Highest education level: Not on file  ?Occupational History  ?  Not on file  ?Tobacco Use  ? Smoking status: Never  ? Smokeless tobacco: Never  ?Vaping Use  ? Vaping Use: Never used  ?Substance and Sexual Activity  ? Alcohol use: No  ? Drug use: No  ? Sexual activity: Yes  ?  Birth control/protection: None  ?Other Topics Concern  ? Not on file  ?Social History Narrative  ? Not on file  ? ?Social Determinants of Health  ? ?Financial Resource Strain: Not on file  ?Food Insecurity: Not on file  ?Transportation Needs: Not on file  ?Physical Activity: Not on file  ?Stress: Not on file  ?Social Connections: Not on file  ? ? ? ?Vital Signs: ?LMP 06/08/2021 (Approximate)  ? ?Physical Exam ?Constitutional:   ?   General: She is not in acute distress. ?   Appearance: She is not toxic-appearing.  ?HENT:  ?   Head: Normocephalic and atraumatic.  ?Eyes:  ?    Extraocular Movements: Extraocular movements intact.  ?   Conjunctiva/sclera: Conjunctivae normal.  ?Pulmonary:  ?   Effort: Pulmonary effort is normal. No respiratory distress.  ?Abdominal:  ?   General: Abdomen is flat.  ?   Palpations: Abdomen is soft.  ?Skin: ?   General: Skin is warm and dry.  ?Neurological:  ?   General: No focal deficit present.  ?   Mental Status: She is alert and oriented to person, place, and time.  ?Psychiatric:     ?   Mood and Affect: Mood normal.     ?   Behavior: Behavior normal.  ?Drain: in appropriate position, insertion site clean and dry, flushes and aspirates easily, aspirated material appears to be thick old blood.  ? ?Imaging: ?No results found. ? ?Labs: ? ?CBC: ?Recent Labs  ?  01/24/22 ?8466 01/25/22 ?0051 01/26/22 ?5993 01/27/22 ?0225  ?WBC 4.9 4.4 4.4 4.6  ?HGB 12.0 11.8* 12.6 12.1  ?HCT 36.3 35.1* 38.6 36.9  ?PLT 143* 172 220 219  ? ? ?COAGS: ?No results for input(s): INR, APTT in the last 8760 hours. ? ?BMP: ?Recent Labs  ?  04/15/21 ?1102 01/22/22 ?1739 01/23/22 ?0337 01/24/22 ?5701  ?NA 137 135 137 135  ?K 3.6 4.0 3.7 3.7  ?CL 106 102 107 106  ?CO2 26 24 21* 20*  ?GLUCOSE 87 96 93 89  ?BUN 9 9 8  5*  ?CALCIUM 9.3 9.4 9.1 8.8*  ?CREATININE 0.48 0.77 0.69 0.74  ?GFRNONAA >60 >60 >60 >60  ? ? ?LIVER FUNCTION TESTS: ?Recent Labs  ?  04/15/21 ?1102 01/22/22 ?1739 01/23/22 ?0337  ?BILITOT 0.5 0.4 0.5  ?AST 21 19 16   ?ALT 20 17 19   ?ALKPHOS 63 158* 142*  ?PROT 7.3 8.1 7.1  ?ALBUMIN 4.1 3.9 3.5  ? ? ?Assessment: ? ?Pelvic abscess: ?-s/p drain placement ?-CT reveals no significant improvement.  Collection may be more of a hematoma than abscess. ?-poor OP despite ideal position ?-Have scheduled at [facility] for drain upsize tomorrow, at 8am.  Pt in agreement and understanding. ? ?Signed: ? , PA ?02/04/2022, 3:03 PM ? ? ?Please refer to [facility] attestation of this note for management and plan.  ? ? ? ? ?  ?

## 2022-02-04 NOTE — Progress Notes (Signed)
?[facility] ? ? ?Subjective:  ?[name] is a 31 y.o. female Hispanic female ( interpreter Debarah Crape [id number] presents for hospital follow up and establish care. Admit date to the hospital was 01/22/22, patient was discharged from the hospital on 01/27/22, patient was admitted for: MDD (major depressive disorder), recurrent episode, severe (HCC), Pelvic abscess in female, Acute deep vein thrombosis (DVT) of iliac vein of left lower extremity (HCC), Acute deep vein thrombosis (DVT) of femoral vein of right lower extremity (HCC), UTI (urinary tract infection), Ileostomy in place [facility]), Stillborn, abnormal ?Today she is feeling better . Patient has No headache, No chest pain,  - No Nausea, No new weakness tingling or numbness, No Cough - shortness of breath .Complains of  abdominal pain epigastric area . (Sitting alleviate lying down) aggravating, walking and  RAISING  up ARMS.  ? ?Past Medical History:  ?Diagnosis Date  ? Medical history non-contributory   ?  ? ?Allergies  ?Allergen Reactions  ? Tape   ?  Adhesive tape- rash  ? ? ?  ?Current Outpatient Medications on File Prior to Visit  ?Medication Sig Dispense Refill  ? APIXABAN (ELIQUIS) VTE STARTER PACK (10MG  AND 5MG ) Take as directed on package: start with two-5mg  tablets by mouth twice daily for 7 days. On day 8, switch to one-5mg  tablet twice daily. 74 tablet 0  ? doxycycline (VIBRA-TABS) 100 MG tablet Take 1 tablet (100 mg total) by mouth every 12 (twelve) hours for 10 days. 20 tablet 0  ? Multiple Vitamins-Minerals (CERTAVITE/ANTIOXIDANTS) TABS Take 1 tablet by mouth daily. 90 tablet 0  ? [START ON 02/22/2022] apixaban (ELIQUIS) 5 MG TABS tablet Take 1 tablet (5 mg total) by mouth 2 (two) times daily. 180 tablet 0  ? [DISCONTINUED] fluticasone (FLONASE) 50 MCG/ACT nasal spray Place 1 spray into both nostrils daily. 16 g 0  ? [DISCONTINUED] LO LOESTRIN FE 1 MG-10 MCG / 10 MCG tablet Take 1 tablet by mouth daily. 1 Package 11  ?  [DISCONTINUED] medroxyPROGESTERone (DEPO-PROVERA) 150 MG/ML injection Inject 1 mL (150 mg total) into the muscle every 3 (three) months. (Patient not taking: Reported on 11/14/2018) 1 mL 4  ? ?No current facility-administered medications on file prior to visit.  ? ? ? ?Review of System: ?Comprehensive ROS Pertinent positive and negative noted in HPI   ? ?Objective:  ?BP 102/73 (BP Location: Right Arm, Patient Position: Sitting, Cuff Size: Normal)   Pulse (!) 103   Temp 98.3 ?F (36.8 ?C) (Oral)   Ht 4\' 11"  (1.499 m)   Wt 132 lb 6.4 oz (60.1 kg)   LMP 06/08/2021 (Approximate)   SpO2 97%   Breastfeeding No   BMI 26.74 kg/m?  ? 14/08/2018 Weights  ? 02/04/22 0956  ?Weight: 132 lb 6.4 oz (60.1 kg)  ? ?Physical Exam: ?General Appearance: Well nourished, in no apparent distress. ?Eyes: PERRLA, EOMs, conjunctiva no swelling or erythema ?Sinuses: No Frontal/maxillary tenderness ?ENT/Mouth: Ext aud canals clear.  Hearing normal.  ?Neck: Supple, thyroid normal.  ?Respiratory: Respiratory effort normal, BS equal bilaterally without rales, rhonchi, wheezing or stridor.  ?Cardio: RRR with no MRGs. Brisk peripheral pulses without edema.  ?Abdomen: Soft, + BS.  Non tender, no guarding, rebound, hernias, masses. ?Lymphatics: Non tender without lymphadenopathy.  ?Musculoskeletal: Full ROM, 5/5 strength, normal gait.  ?Skin: Warm, dry without rashes, lesions, ecchymosis.  ?Neuro: Cranial nerves intact. Normal muscle tone, no cerebellar symptoms. Sensation intact.  ?Psych: Awake and oriented X 3, normal affect, Insight and Judgment  appropriate.  ? ? ?Assessment:  ?Davetta was seen today for hospitalization follow-up. ? ?Diagnoses and all orders for this visit: ? ?Encounter to establish care ?Establish care with PCP ? ?Hospital discharge follow-up ?Retrieved from hospital discharge  ?Follow up with Oley Balm, MD (Interventional Radiology) ?Schedule an appointment with [facility] (General Surgery) in 3 months  (04/27/2022); Llame o p?dale a su proveedor de atenci?n primaria que le ayude a programar una cita con un cirujano colorrectal en aproximadamente 3 meses para hablar sobre la reversi?n de la ileostom?a. Intente obtener registros quir?rgicos del hospital en [place]. ?Call [facility] on 01/27/2022; Please call for a posthospital follow-up appointment. ?Follow up with Claiborne Rigg, NP (Nurse Practitioner); TIME : 9:30 AN / Appointment change to Gwinda Passe, NP- sooner appt ?DATE: MARCH 27 , 2023  ?Claiborne Rigg ,NP  ?NEW LOCATION : [address] ?  ?This note has been created with Education officer, environmental. Any transcriptional errors are unintentional.  ? ?[name], NP ?02/04/2022, 10:01 AM ?  ? ?

## 2022-02-05 ENCOUNTER — Other Ambulatory Visit: Payer: Self-pay | Admitting: Surgery

## 2022-02-05 ENCOUNTER — Other Ambulatory Visit: Payer: Self-pay

## 2022-02-05 ENCOUNTER — Ambulatory Visit (HOSPITAL_COMMUNITY)
Admission: RE | Admit: 2022-02-05 | Discharge: 2022-02-05 | Disposition: A | Discharge status: Home / Self Care | Payer: Medicaid Other | Source: Ambulatory Visit | Attending: Interventional Radiology | Admitting: Interventional Radiology

## 2022-02-05 DIAGNOSIS — Z4803 Encounter for change or removal of drains: Secondary | ICD-10-CM | POA: Diagnosis not present

## 2022-02-05 DIAGNOSIS — N739 Female pelvic inflammatory disease, unspecified: Secondary | ICD-10-CM | POA: Diagnosis not present

## 2022-02-05 DIAGNOSIS — Z4682 Encounter for fitting and adjustment of non-vascular catheter: Secondary | ICD-10-CM | POA: Diagnosis not present

## 2022-02-05 HISTORY — PX: IR CATHETER TUBE CHANGE: IMG717

## 2022-02-05 MED ORDER — IOHEXOL 300 MG/ML  SOLN
25.0000 mL | Freq: Once | INTRAMUSCULAR | Status: AC | PRN
  Administered 2022-02-05: 10 mL

## 2022-02-05 MED ORDER — LIDOCAINE HCL 1 % IJ SOLN
INTRAMUSCULAR | Status: AC
  Administered 2022-02-05: 8 mL
  Filled 2022-02-05: qty 20

## 2022-02-05 NOTE — Procedures (Signed)
Pre procedural Dx: Pelvic abscess drain ?Post procedural Dx: Same ? ?Successful fluoro guided exchange and upsizing of now 14 Fr left lower abdominal/pelvic drain. ?Drain converted to gravity bag. ? ?EBL: None ?Complications: None immediate ? ?Katherina Right, MD ?Pager #: [phone] ? ? ?

## 2022-02-09 DIAGNOSIS — Z932 Ileostomy status: Secondary | ICD-10-CM | POA: Diagnosis not present

## 2022-02-09 DIAGNOSIS — R300 Dysuria: Secondary | ICD-10-CM | POA: Diagnosis not present

## 2022-02-09 DIAGNOSIS — R238 Other skin changes: Secondary | ICD-10-CM | POA: Diagnosis not present

## 2022-02-12 ENCOUNTER — Encounter: Payer: Self-pay | Admitting: *Deleted

## 2022-02-12 ENCOUNTER — Ambulatory Visit
Admission: RE | Admit: 2022-02-12 | Discharge: 2022-02-12 | Disposition: A | Discharge status: Home / Self Care | Payer: Medicaid Other | Source: Ambulatory Visit | Attending: Surgery | Admitting: Surgery

## 2022-02-12 DIAGNOSIS — N739 Female pelvic inflammatory disease, unspecified: Secondary | ICD-10-CM | POA: Diagnosis not present

## 2022-02-12 DIAGNOSIS — K6811 Postprocedural retroperitoneal abscess: Secondary | ICD-10-CM | POA: Diagnosis not present

## 2022-02-12 DIAGNOSIS — Z4682 Encounter for fitting and adjustment of non-vascular catheter: Secondary | ICD-10-CM | POA: Diagnosis not present

## 2022-02-12 HISTORY — PX: IR RADIOLOGIST EVAL & MGMT: IMG5224

## 2022-02-12 MED ORDER — IOPAMIDOL (ISOVUE-300) INJECTION 61%
100.0000 mL | Freq: Once | INTRAVENOUS | Status: AC | PRN
  Administered 2022-02-12: 100 mL via INTRAVENOUS

## 2022-02-12 NOTE — Progress Notes (Signed)
? ?Referring Physician(s): ?Dr Eliot Ford ? ?Chief Complaint: ?The patient is seen in follow up today s/p 01/23/22 12 Fr extraperitoneal collection drain placed ?02/05/22: upsized to 14 Fr ? ?History of present illness: ? ?History placental abruption requiring emergent cesarean section in [place] with multiple subsequent complications resulting in a right hemicolectomy, right lower quadrant ostomy creation and presumed partial hysterectomy.   ?     CT scan of the abdomen and pelvis performed 01/22/2022 demonstrated development of a indeterminate fluid collection within the left lower abdomen/pelvis which underwent percutaneous drainage catheter placement on 01/23/2022. ? ?Upsized drain to 14 Fr 02/04/22 ?Now back today for evaluation /CT and injection of same ? ?She still has abd pain - but resolving some ?Denies fever/chills ?Flushes drain daily- 10 cc saline-- OP 10 cc blood tinged saline ?Still on Bactrim BID ? ?Chart says to follow with Dr Eliot Ford 3 months ? ?She does have an appt at [facility] tomorrow for recheck-- No appt with [facility] that she knows of ? ?Past Medical History:  ?Diagnosis Date  ? Medical history non-contributory   ? ? ?Past Surgical History:  ?Procedure Laterality Date  ? CESAREAN SECTION N/A 06/13/2018  ? Procedure: CESAREAN SECTION;  Surgeon: Harper Bing, MD;  Location: [facility] BIRTHING SUITES;  Service: Obstetrics;  Laterality: N/A;  ? IR CATHETER TUBE CHANGE  02/05/2022  ? IR RADIOLOGIST EVAL & MGMT  02/04/2022  ? MOUTH SURGERY    ? WISDOM TOOTH EXTRACTION Bilateral   ? ? ?Allergies: ?Tape ? ?Medications: ?Prior to Admission medications   ?Medication Sig Start Date End Date Taking? Authorizing Provider  ?apixaban (ELIQUIS) 5 MG TABS tablet Take 1 tablet (5 mg total) by mouth 2 (two) times daily. 02/22/22 05/23/22  Darlin Drop, DO  ?APIXABAN (ELIQUIS) VTE STARTER PACK (10MG  AND 5MG ) Take as directed on package: start with two-5mg  tablets by mouth twice daily for 7 days. On day 8, switch to one-5mg   tablet twice daily. 01/26/22   , DO  ?Multiple Vitamins-Minerals (CERTAVITE/ANTIOXIDANTS) TABS Take 1 tablet by mouth daily. 01/27/22 04/27/22  01/29/22, DO  ?fluticasone (FLONASE) 50 MCG/ACT nasal spray Place 1 spray into both nostrils daily. 01/02/21 04/25/21  01/04/21, NP  ?LO LOESTRIN FE 1 MG-10 MCG / 10 MCG tablet Take 1 tablet by mouth daily. 03/06/20 07/06/20  03/08/20, MD  ?medroxyPROGESTERone (DEPO-PROVERA) 150 MG/ML injection Inject 1 mL (150 mg total) into the muscle every 3 (three) months. ?Patient not taking: Reported on 11/14/2018 07/18/18 07/06/20  09/17/18, MD  ?  ? ?Family History  ?Problem Relation Age of Onset  ? Healthy Father   ? Alcohol abuse Neg Hx   ? Arthritis Neg Hx   ? Asthma Neg Hx   ? Birth defects Neg Hx   ? Cancer Neg Hx   ? COPD Neg Hx   ? Depression Neg Hx   ? Diabetes Neg Hx   ? Drug abuse Neg Hx   ? Early death Neg Hx   ? Hearing loss Neg Hx   ? Heart disease Neg Hx   ? Hyperlipidemia Neg Hx   ? Hypertension Neg Hx   ? Kidney disease Neg Hx   ? Learning disabilities Neg Hx   ? Mental illness Neg Hx   ? Mental retardation Neg Hx   ? Miscarriages / Stillbirths Neg Hx   ? Stroke Neg Hx   ? Vision loss Neg Hx   ? Varicose  Veins Neg Hx   ? ? ?Social History  ? ?Socioeconomic History  ? Marital status: Married  ?  Spouse name: Not on file  ? Number of children: Not on file  ? Years of education: Not on file  ? Highest education level: Not on file  ?Occupational History  ? Not on file  ?Tobacco Use  ? Smoking status: Never  ? Smokeless tobacco: Never  ?Vaping Use  ? Vaping Use: Never used  ?Substance and Sexual Activity  ? Alcohol use: No  ? Drug use: No  ? Sexual activity: Yes  ?  Birth control/protection: None  ?Other Topics Concern  ? Not on file  ?Social History Narrative  ? Not on file  ? ?Social Determinants of Health  ? ?Financial Resource Strain: Not on file  ?Food Insecurity: Not on file  ?Transportation Needs: Not on file  ?Physical Activity:  Not on file  ?Stress: Not on file  ?Social Connections: Not on file  ? ? ? ?Vital Signs: ?LMP 06/08/2021 (Approximate)  ? ?Physical Exam ?Skin: ?   Comments:  ?Drain site is clean and dry ?NT no bleeding ?No signe of infection; no redness ?Abd is tender to palpate ? ?Minimal serous color Op in bag ? ?01/28/2022 FINAL   ?Organism ID, Bacteria STAPHYLOCOCCUS HAEMOLYTICUS  ? ?Drain removed in entirety per Dr Grace Isaac order ?Dressing placed  ? ? ?Imaging: ?No results found. ? ?Labs: ? ?CBC: ?Recent Labs  ?  01/24/22 ?8182 01/25/22 ?0051 01/26/22 ?9937 01/27/22 ?0225  ?WBC 4.9 4.4 4.4 4.6  ?HGB 12.0 11.8* 12.6 12.1  ?HCT 36.3 35.1* 38.6 36.9  ?PLT 143* 172 220 219  ? ? ?COAGS: ?No results for input(s): INR, APTT in the last 8760 hours. ? ?BMP: ?Recent Labs  ?  04/15/21 ?1102 01/22/22 ?1739 01/23/22 ?0337 01/24/22 ?1696  ?NA 137 135 137 135  ?K 3.6 4.0 3.7 3.7  ?CL 106 102 107 106  ?CO2 26 24 21* 20*  ?GLUCOSE 87 96 93 89  ?BUN 9 9 8  5*  ?CALCIUM 9.3 9.4 9.1 8.8*  ?CREATININE 0.48 0.77 0.69 0.74  ?GFRNONAA >60 >60 >60 >60  ? ? ?LIVER FUNCTION TESTS: ?Recent Labs  ?  04/15/21 ?1102 01/22/22 ?1739 01/23/22 ?0337  ?BILITOT 0.5 0.4 0.5  ?AST 21 19 16   ?ALT 20 17 19   ?ALKPHOS 63 158* 142*  ?PROT 7.3 8.1 7.1  ?ALBUMIN 4.1 3.9 3.5  ? ? ?Assessment: ? ?Pelvic abscess drain placed 2/17 and upsized 02/04/22 ?CT revealing no change in collection size ?Dr order for removal of drain ?Pt to see [facility] tomorrow ?Plan for follow up with [facility] in next 2 months per chart---- pt unaware of date as of yet ?IR note will be sent to [facility] ? ? ?Signed: ?3/17, PA-C ?02/12/2022, 1:40 PM ? ? ?Please refer to Dr. Grace Isaac attestation of this note for management and plan.  ? ? ? ? ?  ?

## 2022-02-13 ENCOUNTER — Other Ambulatory Visit: Payer: Self-pay

## 2022-02-13 ENCOUNTER — Ambulatory Visit (HOSPITAL_COMMUNITY)
Admission: RE | Admit: 2022-02-13 | Discharge: 2022-02-13 | Disposition: A | Discharge status: Home / Self Care | Payer: Medicaid Other | Source: Ambulatory Visit | Attending: Nurse Practitioner | Admitting: Nurse Practitioner

## 2022-02-13 DIAGNOSIS — F32A Depression, unspecified: Secondary | ICD-10-CM | POA: Diagnosis not present

## 2022-02-13 DIAGNOSIS — Z432 Encounter for attention to ileostomy: Secondary | ICD-10-CM | POA: Insufficient documentation

## 2022-02-13 DIAGNOSIS — L24B1 Irritant contact dermatitis related to digestive stoma or fistula: Secondary | ICD-10-CM

## 2022-02-13 DIAGNOSIS — R21 Rash and other nonspecific skin eruption: Secondary | ICD-10-CM | POA: Insufficient documentation

## 2022-02-13 DIAGNOSIS — R5383 Other fatigue: Secondary | ICD-10-CM | POA: Insufficient documentation

## 2022-02-13 DIAGNOSIS — L259 Unspecified contact dermatitis, unspecified cause: Secondary | ICD-10-CM | POA: Diagnosis not present

## 2022-02-13 NOTE — Progress Notes (Signed)
[facility]  ? ?Reason for visit:  ?Follow up RLQ ileostomy, contact dermatitis ?We communicate with the assistance of Reuel Boom, remote interpreter. Patient is here alone today and states her sister drove her ?HPI:  ?Right hemicolectomy after emergent c section.  Ileostomy ?ROS  ?Review of Systems  ?Constitutional:  Positive for fatigue.  ?Gastrointestinal:   ?     Midline incision healed.   ?RLQ ileostomy, slightly budded  ?Genitourinary: Negative.   ?Skin:  Positive for rash.  ?     Contact dermatitis improving  ?Psychiatric/Behavioral:    ?     Depression, stabilizing.  Patient states she feels better each day.  Is spending time with family. Smiles, makes eye contact and participates in her own care.   ?All other systems reviewed and are negative. ?Vital signs:  ?BP (!) 82/52 (BP Location: Right Arm)   Pulse (!) 101   Temp 98 ?F (36.7 ?C) (Oral)   Resp 18   Ht 4\' 11"  (1.499 m)   Wt 60.1 kg   LMP 06/08/2021 (Approximate)   SpO2 98%   BMI 26.76 kg/m?  ?Exam:  ?Physical Exam ?Constitutional:   ?   Appearance: She is normal weight.  ?Abdominal:  ?   Palpations: Abdomen is soft.  ?Skin: ?   General: Skin is warm and dry.  ?Neurological:  ?   Mental Status: She is alert and oriented to person, place, and time.  ?Psychiatric:     ?   Behavior: Behavior normal.     ?   Thought Content: Thought content normal.  ?  ?Stoma type/location:  RLQ ileostomy, slightly budded ?Stomal assessment/size:  1" ?Peristomal assessment:  contact dermatitis improved.  Intact but still erythematous  Patient states this has healed after using powder, skin prep, barrier ring and cutting opening appropriate size.   ?Treatment options for stomal/peristomal skin: convex pouch ?Output: liquid green stool.   ?Ostomy pouching: 1pc.convex  ?Education provided:  we discuss dehydration, signs and symptoms. She denies dry mouth, dark urine or dizziness.  She remains weak, but states she is feeling better. Emotionally, she states she  is doing better.  She is overall happy, she is spending time with her family and grateful to be feeling better.  ?I explain the need to drink plenty of fluids to avoid dehydration, including water and gatorade if needed.  We discuss symptoms of blockage and need to chew food thoroughly.   ? ?  ?Impression/dx  ?RLQ ileostomy ?Contact dermatitis, improving ?Discussion  ?See back in 2 weeks ?Plan  ?[facility] has mailed her supplies.  She has 4 boxes at home.  She wants to schedule a follow up with her surgeon.  She had an appointment with IR and they instructed her to make appointment. I will reach out to [facility] to assist with this.  ? ? ? ?Visit time: 50 minutes.  ? ?08/09/2021 FNP-BC ? ?  ?

## 2022-02-13 NOTE — Discharge Instructions (Signed)
Continue convex pouch ?Powder/skin prep and barrier ring ?Monitor for signs dehydration ?Monitor for signs of blockage.  ?See back in 2 weeks.  ?

## 2022-02-16 DIAGNOSIS — Z932 Ileostomy status: Secondary | ICD-10-CM | POA: Diagnosis not present

## 2022-02-16 DIAGNOSIS — I82409 Acute embolism and thrombosis of unspecified deep veins of unspecified lower extremity: Secondary | ICD-10-CM | POA: Diagnosis not present

## 2022-02-16 DIAGNOSIS — R300 Dysuria: Secondary | ICD-10-CM | POA: Diagnosis not present

## 2022-02-16 DIAGNOSIS — R238 Other skin changes: Secondary | ICD-10-CM | POA: Diagnosis not present

## 2022-02-17 ENCOUNTER — Other Ambulatory Visit: Payer: Self-pay

## 2022-02-17 ENCOUNTER — Inpatient Hospital Stay (HOSPITAL_COMMUNITY)
Admission: EM | Admit: 2022-02-17 | Discharge: 2022-02-20 | DRG: 871 | Disposition: A | Discharge status: Home / Self Care | Payer: Medicaid Other | Attending: Internal Medicine | Admitting: Internal Medicine

## 2022-02-17 ENCOUNTER — Emergency Department (HOSPITAL_COMMUNITY): Payer: Medicaid Other

## 2022-02-17 ENCOUNTER — Ambulatory Visit: Payer: Medicaid Other | Admitting: Physician Assistant

## 2022-02-17 ENCOUNTER — Encounter (HOSPITAL_COMMUNITY): Payer: Self-pay | Admitting: Emergency Medicine

## 2022-02-17 VITALS — BP 92/71 | HR 117 | Temp 97.6°F | Resp 18 | Ht 59.0 in | Wt 125.0 lb

## 2022-02-17 DIAGNOSIS — N179 Acute kidney failure, unspecified: Secondary | ICD-10-CM | POA: Diagnosis present

## 2022-02-17 DIAGNOSIS — K2970 Gastritis, unspecified, without bleeding: Secondary | ICD-10-CM | POA: Diagnosis present

## 2022-02-17 DIAGNOSIS — I493 Ventricular premature depolarization: Secondary | ICD-10-CM | POA: Diagnosis not present

## 2022-02-17 DIAGNOSIS — Z9049 Acquired absence of other specified parts of digestive tract: Secondary | ICD-10-CM | POA: Diagnosis not present

## 2022-02-17 DIAGNOSIS — E860 Dehydration: Secondary | ICD-10-CM | POA: Diagnosis present

## 2022-02-17 DIAGNOSIS — Z20822 Contact with and (suspected) exposure to covid-19: Secondary | ICD-10-CM | POA: Diagnosis not present

## 2022-02-17 DIAGNOSIS — R6520 Severe sepsis without septic shock: Secondary | ICD-10-CM | POA: Diagnosis present

## 2022-02-17 DIAGNOSIS — Z79899 Other long term (current) drug therapy: Secondary | ICD-10-CM | POA: Diagnosis not present

## 2022-02-17 DIAGNOSIS — Z603 Acculturation difficulty: Secondary | ICD-10-CM | POA: Diagnosis present

## 2022-02-17 DIAGNOSIS — Z90711 Acquired absence of uterus with remaining cervical stump: Secondary | ICD-10-CM | POA: Diagnosis not present

## 2022-02-17 DIAGNOSIS — N739 Female pelvic inflammatory disease, unspecified: Secondary | ICD-10-CM | POA: Diagnosis not present

## 2022-02-17 DIAGNOSIS — E876 Hypokalemia: Secondary | ICD-10-CM | POA: Diagnosis not present

## 2022-02-17 DIAGNOSIS — L02211 Cutaneous abscess of abdominal wall: Secondary | ICD-10-CM | POA: Insufficient documentation

## 2022-02-17 DIAGNOSIS — E871 Hypo-osmolality and hyponatremia: Secondary | ICD-10-CM | POA: Diagnosis present

## 2022-02-17 DIAGNOSIS — R000 Tachycardia, unspecified: Secondary | ICD-10-CM

## 2022-02-17 DIAGNOSIS — K651 Peritoneal abscess: Secondary | ICD-10-CM | POA: Diagnosis not present

## 2022-02-17 DIAGNOSIS — Z7901 Long term (current) use of anticoagulants: Secondary | ICD-10-CM | POA: Diagnosis not present

## 2022-02-17 DIAGNOSIS — R1110 Vomiting, unspecified: Secondary | ICD-10-CM | POA: Diagnosis not present

## 2022-02-17 DIAGNOSIS — K760 Fatty (change of) liver, not elsewhere classified: Secondary | ICD-10-CM | POA: Diagnosis not present

## 2022-02-17 DIAGNOSIS — N3289 Other specified disorders of bladder: Secondary | ICD-10-CM | POA: Diagnosis not present

## 2022-02-17 DIAGNOSIS — R112 Nausea with vomiting, unspecified: Secondary | ICD-10-CM

## 2022-02-17 DIAGNOSIS — Z86718 Personal history of other venous thrombosis and embolism: Secondary | ICD-10-CM | POA: Diagnosis not present

## 2022-02-17 DIAGNOSIS — Z932 Ileostomy status: Secondary | ICD-10-CM

## 2022-02-17 DIAGNOSIS — A419 Sepsis, unspecified organism: Principal | ICD-10-CM | POA: Diagnosis present

## 2022-02-17 DIAGNOSIS — I82411 Acute embolism and thrombosis of right femoral vein: Secondary | ICD-10-CM

## 2022-02-17 DIAGNOSIS — K3189 Other diseases of stomach and duodenum: Secondary | ICD-10-CM | POA: Diagnosis not present

## 2022-02-17 DIAGNOSIS — D65 Disseminated intravascular coagulation [defibrination syndrome]: Secondary | ICD-10-CM

## 2022-02-17 LAB — COMPREHENSIVE METABOLIC PANEL
ALT: 37 U/L (ref 0–44)
AST: 38 U/L (ref 15–41)
Albumin: 3.6 g/dL (ref 3.5–5.0)
Alkaline Phosphatase: 141 U/L — ABNORMAL HIGH (ref 38–126)
Anion gap: 11 (ref 5–15)
BUN: 10 mg/dL (ref 6–20)
CO2: 19 mmol/L — ABNORMAL LOW (ref 22–32)
Calcium: 9.2 mg/dL (ref 8.9–10.3)
Chloride: 102 mmol/L (ref 98–111)
Creatinine, Ser: 1.07 mg/dL — ABNORMAL HIGH (ref 0.44–1.00)
GFR, Estimated: >60 mL/min (ref >60)
Glucose, Bld: 86 mg/dL (ref 70–99)
Potassium: 4.3 mmol/L (ref 3.5–5.1)
Sodium: 132 mmol/L — ABNORMAL LOW (ref 135–145)
Total Bilirubin: 0.6 mg/dL (ref 0.3–1.2)
Total Protein: 8.5 g/dL — ABNORMAL HIGH (ref 6.5–8.1)

## 2022-02-17 LAB — URINALYSIS, ROUTINE W REFLEX MICROSCOPIC
Bilirubin Urine: NEGATIVE
Glucose, UA: NEGATIVE mg/dL
Hgb urine dipstick: NEGATIVE
Ketones, ur: NEGATIVE mg/dL
Leukocytes,Ua: NEGATIVE
Nitrite: NEGATIVE
Protein, ur: 100 mg/dL — AB
Specific Gravity, Urine: 1.015 (ref 1.005–1.030)
pH: 6 (ref 5.0–8.0)

## 2022-02-17 LAB — RESP PANEL BY RT-PCR (FLU A&B, COVID) ARPGX2
Influenza A by PCR: NEGATIVE
Influenza B by PCR: NEGATIVE
SARS Coronavirus 2 by RT PCR: NEGATIVE

## 2022-02-17 LAB — CBC
HCT: 36.8 % (ref 36.0–46.0)
Hemoglobin: 12.4 g/dL (ref 12.0–15.0)
MCH: 29.2 pg (ref 26.0–34.0)
MCHC: 33.7 g/dL (ref 30.0–36.0)
MCV: 86.8 fL (ref 80.0–100.0)
Platelets: 422 10*3/uL — ABNORMAL HIGH (ref 150–400)
RBC: 4.24 MIL/uL (ref 3.87–5.11)
RDW: 13.7 % (ref 11.5–15.5)
WBC: 10.6 10*3/uL — ABNORMAL HIGH (ref 4.0–10.5)
nRBC: 0 % (ref 0.0–0.2)

## 2022-02-17 LAB — LACTIC ACID, PLASMA: Lactic Acid, Venous: 1.2 mmol/L (ref 0.5–1.9)

## 2022-02-17 LAB — LIPASE, BLOOD: Lipase: 38 U/L (ref 11–51)

## 2022-02-17 MED ORDER — LACTATED RINGERS IV BOLUS
1000.0000 mL | Freq: Once | INTRAVENOUS | Status: AC
  Administered 2022-02-17: 1000 mL via INTRAVENOUS

## 2022-02-17 MED ORDER — TRAZODONE HCL 50 MG PO TABS
25.0000 mg | ORAL_TABLET | Freq: Every evening | ORAL | Status: DC | PRN
Start: 2022-02-17 — End: 2022-02-20

## 2022-02-17 MED ORDER — PIPERACILLIN-TAZOBACTAM 3.375 G IVPB 30 MIN: 3.3750 g | Freq: Once | INTRAVENOUS | Status: DC

## 2022-02-17 MED ORDER — MAGNESIUM HYDROXIDE 400 MG/5ML PO SUSP
30.0000 mL | Freq: Every day | ORAL | Status: DC | PRN
  Filled 2022-02-17: qty 30

## 2022-02-17 MED ORDER — VANCOMYCIN HCL IN DEXTROSE 1-5 GM/200ML-% IV SOLN
1000.0000 mg | INTRAVENOUS | Status: DC
  Administered 2022-02-18: 1000 mg via INTRAVENOUS
  Filled 2022-02-17 (×2): qty 200

## 2022-02-17 MED ORDER — ADULT MULTIVITAMIN W/MINERALS CH
1.0000 | ORAL_TABLET | Freq: Every day | ORAL | Status: DC
  Administered 2022-02-17 – 2022-02-20 (×3): 1 via ORAL
  Filled 2022-02-17 (×4): qty 1

## 2022-02-17 MED ORDER — SODIUM CHLORIDE 0.9 % IV BOLUS
500.0000 mL | Freq: Once | INTRAVENOUS | Status: AC
  Administered 2022-02-17: 500 mL via INTRAVENOUS

## 2022-02-17 MED ORDER — PIPERACILLIN-TAZOBACTAM 3.375 G IVPB
3.3750 g | Freq: Three times a day (TID) | INTRAVENOUS | Status: DC
Start: 2022-02-18 — End: 2022-02-20
  Administered 2022-02-18 – 2022-02-20 (×7): 3.375 g via INTRAVENOUS
  Filled 2022-02-17 (×8): qty 50

## 2022-02-17 MED ORDER — PIPERACILLIN-TAZOBACTAM 3.375 G IVPB 30 MIN
3.3750 g | Freq: Once | INTRAVENOUS | Status: AC
  Administered 2022-02-17: 3.375 g via INTRAVENOUS
  Filled 2022-02-17: qty 50

## 2022-02-17 MED ORDER — LACTATED RINGERS IV BOLUS
1000.0000 mL | Freq: Once | INTRAVENOUS | Status: AC
Start: 2022-02-17 — End: 2022-02-17
  Administered 2022-02-17: 1000 mL via INTRAVENOUS

## 2022-02-17 MED ORDER — ONDANSETRON HCL 4 MG/2ML IJ SOLN
4.0000 mg | Freq: Four times a day (QID) | INTRAMUSCULAR | Status: DC | PRN
  Administered 2022-02-19 (×2): 4 mg via INTRAVENOUS
  Filled 2022-02-17 (×2): qty 2

## 2022-02-17 MED ORDER — ENOXAPARIN SODIUM 40 MG/0.4ML IJ SOSY
40.0000 mg | PREFILLED_SYRINGE | INTRAMUSCULAR | Status: DC
  Administered 2022-02-17: 40 mg via SUBCUTANEOUS
  Filled 2022-02-17: qty 0.4

## 2022-02-17 MED ORDER — ACETAMINOPHEN 650 MG RE SUPP: 650.0000 mg | Freq: Four times a day (QID) | RECTAL | Status: DC | PRN

## 2022-02-17 MED ORDER — SODIUM CHLORIDE 0.9 % IV SOLN: INTRAVENOUS | Status: DC

## 2022-02-17 MED ORDER — IOHEXOL 350 MG/ML SOLN
75.0000 mL | Freq: Once | INTRAVENOUS | Status: AC | PRN
  Administered 2022-02-17: 75 mL via INTRAVENOUS

## 2022-02-17 MED ORDER — VANCOMYCIN HCL IN DEXTROSE 1-5 GM/200ML-% IV SOLN: 1000.0000 mg | Freq: Once | INTRAVENOUS | Status: DC

## 2022-02-17 MED ORDER — ONDANSETRON HCL 4 MG PO TABS
4.0000 mg | ORAL_TABLET | Freq: Four times a day (QID) | ORAL | Status: DC | PRN
  Administered 2022-02-19: 4 mg via ORAL
  Filled 2022-02-17: qty 1

## 2022-02-17 MED ORDER — VANCOMYCIN HCL IN DEXTROSE 1-5 GM/200ML-% IV SOLN
1000.0000 mg | Freq: Once | INTRAVENOUS | Status: AC
  Administered 2022-02-17: 1000 mg via INTRAVENOUS
  Filled 2022-02-17: qty 200

## 2022-02-17 MED ORDER — ACETAMINOPHEN 325 MG PO TABS: 650.0000 mg | ORAL_TABLET | Freq: Four times a day (QID) | ORAL | Status: DC | PRN

## 2022-02-17 MED ORDER — PANTOPRAZOLE SODIUM 40 MG IV SOLR
40.0000 mg | Freq: Once | INTRAVENOUS | Status: AC
  Administered 2022-02-17: 40 mg via INTRAVENOUS
  Filled 2022-02-17: qty 10

## 2022-02-17 MED ORDER — ONDANSETRON HCL 4 MG/2ML IJ SOLN: 4.0000 mg | Freq: Once | INTRAMUSCULAR | Status: DC | PRN

## 2022-02-17 NOTE — ED Notes (Signed)
Patient transported to CT 

## 2022-02-17 NOTE — ED Notes (Signed)
Patient ambulated to restroom independently at this time.

## 2022-02-17 NOTE — ED Notes (Signed)
Pt transferred to room 11 d/t unstable VS ?

## 2022-02-17 NOTE — ED Provider Notes (Signed)
?[facility] ?Provider Note ? ? ?CSN: [id number] ?Arrival date & time: 02/17/22  1241 ? ?  ? ?History ? ?Chief Complaint  ?Patient presents with  ? Nausea  ? Emesis  ? ? ?Maria RevealLinda D Riley is a 31 y.o. female. ? ?HPI ?31 year old female with a complex past medical history including recent pelvic abscesses that were treated in this hospital along with DVT and ileostomy presents with vomiting.  History is taken with the help of the Spanish interpreter.  2 nights ago started feeling ill and has had multiple episodes of emesis.  She feels like she is had normal output out of the ileostomy.  Went to her doctor today and her heart rate was high and blood pressure low so she was sent here.  When she got here her temperature was 100 but otherwise she does not think she has had a fever.  She does not report any abdominal pain or flank/back pain.  No cough.  She does feel like she has had some dysuria whenever she does urinate though she is urinating less. ? ?Home Medications ?Prior to Admission medications   ?Medication Sig Start Date End Date Taking? Authorizing Provider  ?apixaban (ELIQUIS) 5 MG TABS tablet Take 1 tablet (5 mg total) by mouth 2 (two) times daily. 02/22/22 05/23/22  Darlin DropHall, Carole N, DO  ?APIXABAN (ELIQUIS) VTE STARTER PACK (10MG  AND 5MG ) Take as directed on package: start with two-5mg  tablets by mouth twice daily for 7 days. On day 8, switch to one-5mg  tablet twice daily. 01/26/22   Darlin DropHall, Carole N, DO  ?Multiple Vitamins-Minerals (CERTAVITE/ANTIOXIDANTS) TABS Take 1 tablet by mouth daily. 01/27/22 04/27/22  Darlin DropHall, Carole N, DO  ?fluticasone (FLONASE) 50 MCG/ACT nasal spray Place 1 spray into both nostrils daily. 01/02/21 04/25/21  Georgetta HaberBurky, Natalie B, NP  ?LO LOESTRIN FE 1 MG-10 MCG / 10 MCG tablet Take 1 tablet by mouth daily. 03/06/20 07/06/20  Brock BadHarper, Charles A, MD  ?medroxyPROGESTERone (DEPO-PROVERA) 150 MG/ML injection Inject 1 mL (150 mg total) into the muscle every 3 (three)  months. ?Patient not taking: Reported on 11/14/2018 07/18/18 07/06/20  Brock BadHarper, Charles A, MD  ?   ? ?Allergies    ?Tape   ? ?Review of Systems   ?Review of Systems  ?Constitutional:  Negative for fever.  ?Respiratory:  Negative for cough and shortness of breath.   ?Gastrointestinal:  Positive for vomiting. Negative for abdominal pain, constipation, diarrhea and nausea.  ?Genitourinary:  Positive for dysuria. Negative for flank pain.  ?Musculoskeletal:  Negative for back pain.  ? ?Physical Exam ?Updated Vital Signs ?BP (!) 83/63   Pulse 94   Temp 100 ?F (37.8 ?C) (Oral)   Resp 18   LMP 06/08/2021 (Approximate)   SpO2 100%   Breastfeeding Unknown  ?Physical Exam ?Vitals and nursing note reviewed.  ?Constitutional:   ?   Appearance: She is well-developed. She is not ill-appearing or diaphoretic.  ?HENT:  ?   Head: Normocephalic and atraumatic.  ?Cardiovascular:  ?   Rate and Rhythm: Regular rhythm. Tachycardia present.  ?   Heart sounds: Normal heart sounds.  ?Pulmonary:  ?   Effort: Pulmonary effort is normal.  ?   Breath sounds: Normal breath sounds.  ?Abdominal:  ?   Palpations: Abdomen is soft.  ?   Tenderness: There is abdominal tenderness (lower, mostly LLQ) in the left lower quadrant.  ? ? ?   Comments: Ileostomy in place. No obvious blood in bag  ?Skin: ?  General: Skin is warm and dry.  ?Neurological:  ?   Mental Status: She is alert.  ? ? ?ED Results / Procedures / Treatments   ?Labs ?(all labs ordered are listed, but only abnormal results are displayed) ?Labs Reviewed  ?COMPREHENSIVE METABOLIC PANEL - Abnormal; Notable for the following components:  ?    Result Value  ? Sodium 132 (*)   ? CO2 19 (*)   ? Creatinine, Ser 1.07 (*)   ? Total Protein 8.5 (*)   ? Alkaline Phosphatase 141 (*)   ? All other components within normal limits  ?CBC - Abnormal; Notable for the following components:  ? WBC 10.6 (*)   ? Platelets 422 (*)   ? All other components within normal limits  ?URINALYSIS, ROUTINE W REFLEX  MICROSCOPIC - Abnormal; Notable for the following components:  ? Protein, ur 100 (*)   ? Bacteria, UA RARE (*)   ? All other components within normal limits  ?RESP PANEL BY RT-PCR (FLU A&B, COVID) [id number]  ?URINE CULTURE  ?CULTURE, BLOOD (ROUTINE X 2)  ?CULTURE, BLOOD (ROUTINE X 2)  ?LIPASE, BLOOD  ?LACTIC ACID, PLASMA  ?LACTIC ACID, PLASMA  ? ? ?EKG ?EKG Interpretation ? ?Date/Time:  Tuesday February 17 2022 17:14:38 EDT ?Ventricular Rate:  109 ?PR Interval:  121 ?QRS Duration: 87 ?QT Interval:  294 ?QTC Calculation: 396 ?R Axis:   67 ?Text Interpretation: Sinus tachycardia Ventricular premature complex Aberrant conduction of SV complex(es) Borderline T abnormalities, anterior leads Confirmed by Pricilla Loveless [phone]) on 02/17/2022 5:26:32 PM ? ?Radiology ?CT ABDOMEN PELVIS W CONTRAST ? ?Result Date: 02/17/2022 ?CLINICAL DATA:  Nausea and vomiting.  Pain with urination. EXAM: CT ABDOMEN AND PELVIS WITH CONTRAST TECHNIQUE: Multidetector CT imaging of the abdomen and pelvis was performed using the standard protocol following bolus administration of intravenous contrast. RADIATION DOSE REDUCTION: This exam was performed according to the departmental dose-optimization program which includes automated exposure control, adjustment of the mA and/or kV according to patient size and/or use of iterative reconstruction technique. CONTRAST:  44mL OMNIPAQUE IOHEXOL 350 MG/ML SOLN COMPARISON:  02/04/2022. FINDINGS: Lower chest: No acute abnormality. Hepatobiliary: There is focal fatty infiltration of the liver adjacent to the falciform ligament. No biliary ductal dilatation. The gallbladder is without stones. Pancreas: Unremarkable. No pancreatic ductal dilatation or surrounding inflammatory changes. Spleen: The spleen is stable in size and morphology with a lobular contour. Adrenals/Urinary Tract: No adrenal nodule or mass. No renal calculus or hydronephrosis. The kidneys enhance symmetrically. Diffuse bladder wall thickening is  noted. There is persistent mass effect on the bladder with rightward shift. Stomach/Bowel: Right hemicolectomy changes are noted with a right lower quadrant ostomy. No bowel obstruction. Mild diffuse gastric wall thickening is noted, possible gastritis. Vascular/Lymphatic: No significant vascular findings are present. A few prominent lymph nodes are noted in the pelvis which are likely reactive. Reproductive: Presumed hysterectomy changes are noted in the pelvis. The adnexal regions are not well delineated to a fat stranding. Other: There is a rim enhancing collection in the pelvis on the left with air-fluid levels and extension into the subcutaneous fat in the anterior abdominal wall. The collection measures 6.5 x 3.3 x 4.7 cm, increased in size from the prior exam. There is significantly increased fat stranding in the pelvis with loculated foci of air in the peritoneum external to the abscess wall and concerning for local extension of abscess into the peritoneum. Musculoskeletal: No acute osseous abnormality. IMPRESSION: 1. Rim enhancing collection in the pelvis on the  left with air-fluid levels measuring 6.5 x 3.3 x 4.7 cm, increased in size from the prior exam with significantly increased surrounding inflammatory changes. There is extension of the collection with loculated foci of air into the subcutaneous fat in the anterior abdominal wall and also loculated foci of air external to the abscess wall in the peritoneum concerning for intraperitoneal extension and/or new local abscess formation. 2. Mass effect on the urinary bladder with diffuse bladder wall thickening, possible infectious or inflammatory cystitis related to local inflammatory changes. 3. Mild gastric wall thickening, possible gastritis. Electronically Signed   By: Thornell Sartorius M.D.   On: 02/17/2022 20:25   ? ?Procedures ?Procedures  ? ? ?Medications Ordered in ED ?Medications  ?ondansetron (ZOFRAN) injection 4 mg (has no administration in time  range)  ?piperacillin-tazobactam (ZOSYN) IVPB 3.375 g (3.375 g Intravenous New Bag/Given 02/17/22 2233)  ?vancomycin (VANCOCIN) IVPB 1000 mg/200 mL premix (1,000 mg Intravenous New Bag/Given 02/17/22 2232)  ?multivitamin w

## 2022-02-17 NOTE — ED Triage Notes (Signed)
Translator/pt stated, having nausea vomiting since Sunday night. Denies any other symptoms ?

## 2022-02-17 NOTE — H&P (Addendum)
?  ?  ?[facility] ? ? ?PATIENT NAME: Maria Riley   ? ?MR#:  [id number] ? ?DATE OF BIRTH:  1991/12/07 ? ?DATE OF ADMISSION:  02/17/2022 ? ?PRIMARY CARE PHYSICIAN: Kerin Perna, NP  ? ?Patient is coming from: Home ? ?REQUESTING/REFERRING PHYSICIAN: Sherwood Gambler, MD  ? ?CHIEF COMPLAINT:  ? ?Chief Complaint  ?Patient presents with  ? Nausea  ? Emesis  ? ?The patient is Spanish-speaking and history was obtained through an interpreter via [place] interpretation. ?HISTORY OF PRESENT ILLNESS:  ?Maria Riley is a 31 y.o. female with medical history significant for lower extremity DVT and pelvic abscesses that were treated here with IR drainage when she was admitted on 2/16.  The patient has a history of emergent C-section and subsequent multiple abdominal surgeries including right hemicolectomy and end ileostomy.  She presents to the emergency room with recurrent nausea and vomiting for the last couple of days without diarrhea.  She denies any fever or chills.  She admits to dysuria and urinary frequency without urgency.  She was noted to be hypotensive and tachycardic.  She denies any dyspnea or cough or wheezing.  Her left lower quadrant abdominal wound has been draining with purulent discharge.  No chest pain or palpitations.  No cough or wheezing or dyspnea. ?ED Course: When she came to the ER heart rate was 103 with otherwise normal vital signs.  Labs reveal mild hyponatremia 132 and CO2 of 19 with alk phos 141.  CBC showed WBC of 10.6 and platelets of 422.  UA showed 100 protein and was otherwise unremarkable. ? ?EKG as reviewed by me : EKG showed sinus tachycardia with rate 109 with PVCs and no acute abnormality ?Imaging: Abdominal and pelvic CT scan showed the following: ?1. Rim enhancing collection in the pelvis on the left with air-fluid ?levels measuring 6.5 x 3.3 x 4.7 cm, increased in size from the ?prior exam with significantly increased surrounding inflammatory ?changes. There is  extension of the collection with loculated foci of ?air into the subcutaneous fat in the anterior abdominal wall and ?also loculated foci of air external to the abscess wall in the ?peritoneum concerning for intraperitoneal extension and/or new local ?abscess formation. ?2. Mass effect on the urinary bladder with diffuse bladder wall ?thickening, possible infectious or inflammatory cystitis related to ?local inflammatory changes. ?3. Mild gastric wall thickening, possible gastritis. ? ?The patient was given 2 L bolus of IV lactated Ringer, IV Zosyn and meclizine, and 4 mg of IV Zofran.  General surgery consult was requested and patient was seen by Dr. Zenia Resides recommended IR consult to evaluate the patient for percutaneous drainage, and continue antibiotic therapy. ? ?The patient will be admitted to a progressive unit bed for further evaluation and management. ?PAST MEDICAL HISTORY:  ?-Bilateral lower extremity DVT involving the iliac vein of the left lower extremity and femoral vein on the right lower extremity ?- Pelvic abscesses ? ? ?PAST SURGICAL HISTORY:  ? ?Past Surgical History:  ?Procedure Laterality Date  ? CESAREAN SECTION N/A 06/13/2018  ? Procedure: CESAREAN SECTION;  Surgeon: Aletha Halim, MD;  Location: [facility];  Service: Obstetrics;  Laterality: N/A;  ? IR CATHETER TUBE CHANGE  02/05/2022  ? IR RADIOLOGIST EVAL & MGMT  02/04/2022  ? IR RADIOLOGIST EVAL & MGMT  02/12/2022  ? MOUTH SURGERY    ? WISDOM TOOTH EXTRACTION Bilateral   ?-Ileostomy ? ?SOCIAL HISTORY:  ? ?Social History  ? ?Tobacco Use  ? Smoking status: Never  ?  Smokeless tobacco: Never  ?Substance Use Topics  ? Alcohol use: No  ? ? ?FAMILY HISTORY:  ? ?Family History  ?Problem Relation Age of Onset  ? Healthy Father   ? Alcohol abuse Neg Hx   ? Arthritis Neg Hx   ? Asthma Neg Hx   ? Birth defects Neg Hx   ? Cancer Neg Hx   ? COPD Neg Hx   ? Depression Neg Hx   ? Diabetes Neg Hx   ? Drug abuse Neg Hx   ? Early death Neg Hx   ? Hearing loss  Neg Hx   ? Heart disease Neg Hx   ? Hyperlipidemia Neg Hx   ? Hypertension Neg Hx   ? Kidney disease Neg Hx   ? Learning disabilities Neg Hx   ? Mental illness Neg Hx   ? Mental retardation Neg Hx   ? Miscarriages / Stillbirths Neg Hx   ? Stroke Neg Hx   ? Vision loss Neg Hx   ? Varicose Veins Neg Hx   ? ? ?DRUG ALLERGIES:  ? ?Allergies  ?Allergen Reactions  ? Tape   ?  Adhesive tape- rash  ? ? ?REVIEW OF SYSTEMS:  ? ?ROS ?As per history of present illness. All pertinent systems were reviewed above. Constitutional, HEENT, cardiovascular, respiratory, GI, GU, musculoskeletal, neuro, psychiatric, endocrine, integumentary and hematologic systems were reviewed and are otherwise negative/unremarkable except for positive findings mentioned above in the HPI. ? ? ?MEDICATIONS AT HOME:  ? ?Prior to Admission medications   ?Medication Sig Start Date End Date Taking? Authorizing Provider  ?apixaban (ELIQUIS) 5 MG TABS tablet Take 1 tablet (5 mg total) by mouth 2 (two) times daily. 02/22/22 05/23/22  Kayleen Memos, DO  ?APIXABAN (ELIQUIS) VTE STARTER PACK (10MG AND 5MG) Take as directed on package: start with two-52m tablets by mouth twice daily for 7 days. On day 8, switch to one-535mtablet twice daily. 01/26/22   HaKayleen MemosDO  ?Multiple Vitamins-Minerals (CERTAVITE/ANTIOXIDANTS) TABS Take 1 tablet by mouth daily. 01/27/22 04/27/22  HaKayleen MemosDO  ?fluticasone (FLONASE) 50 MCG/ACT nasal spray Place 1 spray into both nostrils daily. 01/02/21 04/25/21  BuZigmund GottronNP  ?LO LOESTRIN FE 1 MG-10 MCG / 10 MCG tablet Take 1 tablet by mouth daily. 03/06/20 07/06/20  HaShelly BombardMD  ?medroxyPROGESTERone (DEPO-PROVERA) 150 MG/ML injection Inject 1 mL (150 mg total) into the muscle every 3 (three) months. ?Patient not taking: Reported on 11/14/2018 07/18/18 07/06/20  HaShelly BombardMD  ? ?  ? ?VITAL SIGNS:  ?Blood pressure (!) 88/58, pulse 86, temperature 98 ?F (36.7 ?C), temperature source Oral, resp. rate 20, last  menstrual period 06/08/2021, SpO2 100 %, unknown if currently breastfeeding. ? ?PHYSICAL EXAMINATION:  ?Physical Exam ? ?GENERAL:  [age].o.-year-old Hispanic female patient lying in the bed with no acute distress.  ?EYES: Pupils equal, round, reactive to light and accommodation. No scleral icterus. Extraocular muscles intact.  ?HEENT: Head atraumatic, normocephalic. Oropharynx and nasopharynx clear.  ?NECK:  Supple, no jugular venous distention. No thyroid enlargement, no tenderness.  ?LUNGS: Normal breath sounds bilaterally, no wheezing, rales,rhonchi or crepitation. No use of accessory muscles of respiration.  ?CARDIOVASCULAR: Regular rate and rhythm, S1, S2 normal. No murmurs, rubs, or gallops.  ?ABDOMEN: Soft, nondistended, with normal left lower quadrant abdominal tenderness without rebound tenderness guarding or rigidity.. Bowel sounds present. No organomegaly or mass.  Ileostomy was in place with no blood in her stools. ?EXTREMITIES: No  pedal edema, cyanosis, or clubbing.  ?NEUROLOGIC: Cranial nerves II through XII are intact. Muscle strength 5/5 in all extremities. Sensation intact. Gait not checked.  ?PSYCHIATRIC: The patient is alert and oriented x 3.  Normal affect and good eye contact. ?SKIN: No obvious rash, lesion, or ulcer.  ? ?LABORATORY PANEL:  ? ?CBC ?Recent Labs  ?Lab 02/17/22 ?1315  ?WBC 10.6*  ?HGB 12.4  ?HCT 36.8  ?PLT 422*  ? ?------------------------------------------------------------------------------------------------------------------ ? ?Chemistries  ?Recent Labs  ?Lab 02/17/22 ?1315  ?NA 132*  ?K 4.3  ?CL 102  ?CO2 19*  ?GLUCOSE 86  ?BUN 10  ?CREATININE 1.07*  ?CALCIUM 9.2  ?AST 38  ?ALT 37  ?ALKPHOS 141*  ?BILITOT 0.6  ? ?------------------------------------------------------------------------------------------------------------------ ? ?Cardiac Enzymes ?No results for input(s): TROPONINI in the last 168  hours. ?------------------------------------------------------------------------------------------------------------------ ? ?RADIOLOGY:  ?CT ABDOMEN PELVIS W CONTRAST ? ?Result Date: 02/17/2022 ?CLINICAL DATA:  Nausea and vomiting.  Pain with urination. EXAM: CT ABDOMEN AND PELVIS WITH CONTRAST

## 2022-02-17 NOTE — ED Notes (Signed)
Pt reports pain when urinating and has some pain to her lower back and tender to palpation of suprapubic area. Pt denies any pain at this moment and reports she is only nauseated when eating.  ?

## 2022-02-17 NOTE — Progress Notes (Signed)
Pharmacy Antibiotic Note ? ?Maria Riley is a 31 y.o. female admitted on 02/17/2022 with  intra-abdominal abscess .  Pharmacy has been consulted for vancomycin and Zosyn dosing. Patient had emergency cesarean section with multiple complications including colectomy and ileostomy. Patient was hospitalized last month for pelvic abscess. Patient is presenting to the ED again with nausea and vomiting. The abscess is confirmed on CT 3/14.  ? ?WBC 10.6, Temp 100F, HR 121, Lactic acid in process ? ?Plan: ?Vancomycin 1 g every 24 hours for  [facility]   ?Zosyn 3.375 mg every 8 hours ?F/u clinical signs and symptoms and deescalate as indicated ?Monitor for worsening AKI with the addition of vancomycin and Zosyn ?Levels as indicated ? ? ?Temp (24hrs), Avg:98.8 ?F (37.1 ?C), Min:97.6 ?F (36.4 ?C), Max:100 ?F (37.8 ?C) ? ?Recent Labs  ?Lab 02/17/22 ?1315  ?WBC 10.6*  ?CREATININE 1.07*  ?  ?Estimated Creatinine Clearance: 59.5 mL/min (A) (by C-G formula based on SCr of 1.07 mg/dL (H)).   ? ?Allergies  ?Allergen Reactions  ? Tape   ?  Adhesive tape- rash  ? ? ?Antimicrobials this admission: ?Vancomycin 3/14 >>  ?Zosyn 3/14 >>  ? ?Dose adjustments this admission: ?none ? ?Microbiology results: ?3/14 BCx: IP ?3/14 UCx: IP  ? ?Thank you for allowing pharmacy to participate in this patient's care. ? ?Enos Fling, PharmD ?PGY1 Pharmacy Resident ?02/17/2022 10:04 PM ?Check [email or web address] for unit specific pharmacy number ? ? ?

## 2022-02-17 NOTE — ED Notes (Signed)
Phlebotomy at bedside for cultures and lactic. ?

## 2022-02-17 NOTE — Patient Instructions (Signed)
I do encourage you to present to the emergency department for prompt evaluation. ? ?I hope that you feel better soon. ? ?Roney Jaffe, PA-C ?Physician Assistant ?[facility] ?[email or web address] ? ? ?V?mitos, en adultos ?Vomiting, Adult ?Los v?mitos se producen cuando el contenido del est?mago sale con fuerza por la boca. Antes de vomitar, muchas personas sienten n?useas. Los v?mitos pueden hacerlo sentir d?bil y causar deshidrataci?n. ?La deshidrataci?n puede hacerle sentir cansancio, sed, sequedad en la boca y disminuci?n en la frecuencia con la que orina. Los [employer] y las personas que tienen otras enfermedades o un sistema de defensa del cuerpo (sistema inmunitario) d?bil tienen mayor riesgo de sufrir deshidrataci?n. Es importante tratar los v?mitos como se lo haya indicado el m?dico. ?Siga estas indicaciones en su casa: ?Controle sus s?ntomas para detectar cualquier cambio. Informe al m?dico acerca de los cambios. ?Qu? debe comer y beber ?  ?Siga estas recomendaciones como se lo haya indicado el m?dico: ?Tome una soluci?n de rehidrataci?n oral (SRO). Esta es una bebida que se vende en farmacias y tiendas minoristas. ?En la medida en que pueda, consuma alimentos blandos y f?ciles de digerir en peque?as cantidades. Estos alimentos incluyen bananas, compota de [place], arroz, carnes [place], tostadas y [address]. ?En la medida en que pueda, beba l?quidos transparentes lentamente y en peque?as cantidades. Los l?quidos transparentes incluyen agua, cubitos de hielo, bebidas deportivas bajas en calor?as y [place] de fruta rebajado con agua (jugo de fruta diluido). ?Evite consumir l?quidos que contengan mucha az?car o cafe?na, como bebidas energ?ticas, bebidas deportivas y refrescos. ?Evite tomar alcohol. ?Evite los alimentos condimentados o con alto contenido de [place]. ? ?Indicaciones generales ?L?vese las manos frecuentemente con agua y jab?n durante  al menos 20 segundos. Use desinfectante para manos si no dispone de agua y jab?n. ?Aseg?rese de que todos en el hogar se laven las manos con frecuencia. ?Use los medicamentos de venta libre y los recetados solamente como se lo haya indicado el m?dico. ?Descanse en su casa mientras se recupera. ?Controle su afecci?n para detectar cualquier cambio. ?Concurra a todas las visitas de seguimiento. Esto es importante. ?Comun?quese con un m?dico si: ?Los v?mitos empeoran. ?Aparecen nuevos s?ntomas. ?Tiene fiebre. ?No puede beber l?quidos sin vomitar. ?Se siente aturdido o mareado. ?Tiene dolor de [place]. ?Tiene calambres musculares. ?Tiene una erupci?n cut?nea. ?Siente dolor al [employer]. ?Solicite ayuda de inmediato si: ?Siente dolor en el pecho, el cuello, los brazos o la mand?bula. ?Su coraz?n late muy r?pidamente. ?Tiene problemas para respirar o respira muy r?pidamente. ?Se siente muy d?bil o se desmaya. ?Siente la piel fr?a y h?meda. ?Se siente confundido. ?Tiene v?mitos persistentes. ?Vomita y el v?mito es de color rojo intenso o se asemeja al poso del caf?Marland Kitchen ?Sus deposiciones (heces) tienen Retail buyer o son de color negro, o tienen aspecto alquitranado. ?Siente dolor de cabeza intenso, rigidez en el cuello, o ambas cosas. ?Tiene dolor intenso, c?licos o distensi?n abdominal. ?Tiene signos de deshidrataci?n, como los siguientes: ?Orina de color oscuro, muy escasa o falta de orina. ?Labios agrietados. ?Sequedad de boca. ?Ojos hundidos. ?Somnolencia. ?Debilidad. ?Estos s?ntomas pueden Customer service manager. Solicite ayuda de inmediato. Llame al 911. ?No espere a ver si los s?ntomas desaparecen. ?No conduzca por sus propios medios [employer]. ?Resumen ?Los v?mitos se producen cuando el contenido del est?mago sale con fuerza por la boca. Los v?mitos pueden causarle deshidrataci?n. ?Es importante tratar los v?mitos como se lo haya indicado el m?dico. Siga las indicaciones del m?dico sobre qu? debe comer y  beber. ?L?vese  las manos frecuentemente con agua y jab?n durante al menos 20 segundos. Use desinfectante para manos si no dispone de agua y jab?n. ?Observe su afecci?n para detectar cualquier cambio y para detectar signos de deshidrataci?n. ?Concurra a todas las visitas de seguimiento. Esto es importante. ?Esta informaci?n no tiene Theme park manager el consejo del m?dico. Aseg?rese de hacerle al m?dico cualquier pregunta que tenga. ?Document Revised: 07/02/2021 Document Reviewed: 07/02/2021 ?Elsevier Patient Education ? 2022 Elsevier Inc. ? ?

## 2022-02-17 NOTE — Progress Notes (Signed)
Patient had milk and bread for breakfast. ?Patient did not take any medication today. ?Patient reports vomiting since Sunday with last episode being in the parking lot. ?Patient denies pain at this time. ?Patient denies fever/ diarrhea/ runny nose/ cough. ?

## 2022-02-17 NOTE — Consult Note (Signed)
Reason for Consult:intra-abdominal abscess ?Referring Physician: Sherwood Gambler ? ?Maria Riley is an 31 y.o. female.  ?HPI: 31yo F known to our service with a complex history of placental abruption with emergency cesarean section in [place] with multiple complications requiring right colectomy and ileostomy.  She was admitted to here last month with an intra-abdominal abscess.  She was followed by our service.  Interventional radiology placed a drain.  She improved and was discharged.  She underwent follow-up CT scan by interventional radiology on 3/9.  There CT at that time demonstrated that the abscess persisted but her drain was not putting out much so her drain was removed.  Today she returns back to the emergency room with nausea and vomiting.  She denies abdominal pain.  CT scan today reveals persistent pelvic abscess that seems to extend up to where the drain exited her abdominal wall.  It also shows some gastritis.  I was asked to see her in consultation. ? ?Past Medical History:  ?Diagnosis Date  ? Medical history non-contributory   ? ? ?Past Surgical History:  ?Procedure Laterality Date  ? CESAREAN SECTION N/A 06/13/2018  ? Procedure: CESAREAN SECTION;  Surgeon: Aletha Halim, MD;  Location: [facility];  Service: Obstetrics;  Laterality: N/A;  ? IR CATHETER TUBE CHANGE  02/05/2022  ? IR RADIOLOGIST EVAL & MGMT  02/04/2022  ? IR RADIOLOGIST EVAL & MGMT  02/12/2022  ? MOUTH SURGERY    ? WISDOM TOOTH EXTRACTION Bilateral   ? ? ?Family History  ?Problem Relation Age of Onset  ? Healthy Father   ? Alcohol abuse Neg Hx   ? Arthritis Neg Hx   ? Asthma Neg Hx   ? Birth defects Neg Hx   ? Cancer Neg Hx   ? COPD Neg Hx   ? Depression Neg Hx   ? Diabetes Neg Hx   ? Drug abuse Neg Hx   ? Early death Neg Hx   ? Hearing loss Neg Hx   ? Heart disease Neg Hx   ? Hyperlipidemia Neg Hx   ? Hypertension Neg Hx   ? Kidney disease Neg Hx   ? Learning disabilities Neg Hx   ? Mental illness Neg Hx   ? Mental retardation  Neg Hx   ? Miscarriages / Stillbirths Neg Hx   ? Stroke Neg Hx   ? Vision loss Neg Hx   ? Varicose Veins Neg Hx   ? ? ?Social History:  reports that she has never smoked. She has never used smokeless tobacco. She reports that she does not drink alcohol and does not use drugs. ? ?Allergies:  ?Allergies  ?Allergen Reactions  ? Tape   ?  Adhesive tape- rash  ? ? ?Medications: I have reviewed the patient's current medications. ? ?Results for orders placed or performed during the hospital encounter of 02/17/22 (from the past 48 hour(s))  ?Urinalysis, Routine w reflex microscopic Urine, Clean Catch     Status: Abnormal  ? Collection Time: 02/17/22  1:06 PM  ?Result Value Ref Range  ? Color, Urine YELLOW YELLOW  ? APPearance CLEAR CLEAR  ? Specific Gravity, Urine 1.015 1.005 - 1.030  ? pH 6.0 5.0 - 8.0  ? Glucose, UA NEGATIVE NEGATIVE mg/dL  ? Hgb urine dipstick NEGATIVE NEGATIVE  ? Bilirubin Urine NEGATIVE NEGATIVE  ? Ketones, ur NEGATIVE NEGATIVE mg/dL  ? Protein, ur 100 (A) NEGATIVE mg/dL  ? Nitrite NEGATIVE NEGATIVE  ? Leukocytes,Ua NEGATIVE NEGATIVE  ? RBC / HPF  0-5 0 - 5 RBC/hpf  ? WBC, UA 0-5 0 - 5 WBC/hpf  ? Bacteria, UA RARE (A) NONE SEEN  ? Squamous Epithelial / LPF 0-5 0 - 5  ? Mucus PRESENT   ?  Comment: Performed at [facility], [place] [address], [place], [place] [address]  ?Lipase, blood     Status: None  ? Collection Time: 02/17/22  1:15 PM  ?Result Value Ref Range  ? Lipase 38 11 - 51 U/L  ?  Comment: Performed at [facility], [place] [address], [place], [place] [address]  ?Comprehensive metabolic panel     Status: Abnormal  ? Collection Time: 02/17/22  1:15 PM  ?Result Value Ref Range  ? Sodium 132 (L) 135 - 145 mmol/L  ? Potassium 4.3 3.5 - 5.1 mmol/L  ? Chloride 102 98 - 111 mmol/L  ? CO2 19 (L) 22 - 32 mmol/L  ? Glucose, Bld 86 70 - 99 mg/dL  ?  Comment: Glucose reference range applies only to samples taken after fasting for at least 8 hours.  ? BUN 10 6 - 20 mg/dL  ? Creatinine, Ser 1.07 (H)  0.44 - 1.00 mg/dL  ? Calcium 9.2 8.9 - 10.3 mg/dL  ? Total Protein 8.5 (H) 6.5 - 8.1 g/dL  ? Albumin 3.6 3.5 - 5.0 g/dL  ? AST 38 15 - 41 U/L  ? ALT 37 0 - 44 U/L  ? Alkaline Phosphatase 141 (H) 38 - 126 U/L  ? Total Bilirubin 0.6 0.3 - 1.2 mg/dL  ? GFR, Estimated >60 >60 mL/min  ?  Comment: (NOTE) ?Calculated using the CKD-EPI Creatinine Equation (2021) ?  ? Anion gap 11 5 - 15  ?  Comment: Performed at [facility], [place] [address], [place], [place] [address]  ?CBC     Status: Abnormal  ? Collection Time: 02/17/22  1:15 PM  ?Result Value Ref Range  ? WBC 10.6 (H) 4.0 - 10.5 K/uL  ? RBC 4.24 3.87 - 5.11 MIL/uL  ? Hemoglobin 12.4 12.0 - 15.0 g/dL  ? HCT 36.8 36.0 - 46.0 %  ? MCV 86.8 80.0 - 100.0 fL  ? MCH 29.2 26.0 - 34.0 pg  ? MCHC 33.7 30.0 - 36.0 g/dL  ? RDW 13.7 11.5 - 15.5 %  ? Platelets 422 (H) 150 - 400 K/uL  ? nRBC 0.0 0.0 - 0.2 %  ?  Comment: Performed at [facility], [place] [address] [place], [place] [address]  ?Resp Panel by RT-PCR (Flu A&B, Covid) Nasopharyngeal Swab     Status: None  ? Collection Time: 02/17/22  6:42 PM  ? Specimen: Nasopharyngeal Swab; Nasopharyngeal(NP) swabs in vial transport medium  ?Result Value Ref Range  ? SARS Coronavirus 2 by RT PCR NEGATIVE NEGATIVE  ?  Comment: (NOTE) ?SARS-CoV-2 target nucleic acids are NOT DETECTED. ? ?The SARS-CoV-2 RNA is generally detectable in upper respiratory ?specimens during the acute phase of infection. The lowest ?concentration of SARS-CoV-2 viral copies this assay can detect is ?138 copies/mL. A negative result does not preclude SARS-Cov-2 ?infection and should not be used as the sole basis for treatment or ?other patient management decisions. A negative result may occur with  ?improper specimen collection/handling, submission of specimen other ?than nasopharyngeal swab, presence of viral mutation(s) within the ?areas targeted by this assay, and inadequate number of viral ?copies(<138 copies/mL). A negative result must be combined  with ?clinical observations, patient history, and epidemiological ?information. The expected  result is Negative. ? ?Fact Sheet for Patients:  ?EntrepreneurPulse.com.au ? ?Fact Sheet for Healthcare Providers:  ?IncredibleEmployment.be ? ?This test is no t yet approved or cleared by the [place] FDA and  ?has been authorized for detection and/or diagnosis of SARS-CoV-2 by ?FDA under an Emergency Use Authorization (EUA). This EUA will remain  ?in effect (meaning this test can be used) for the duration of the ?COVID-19 declaration under Section 564(b)(1) of the Act, 21 ?U.S.C.section 360bbb-3(b)(1), unless the authorization is terminated  ?or revoked sooner.  ? ? ?  ? Influenza A by PCR NEGATIVE NEGATIVE  ? Influenza B by PCR NEGATIVE NEGATIVE  ?  Comment: (NOTE) ?The Xpert Xpress SARS-CoV-2/FLU/RSV plus assay is intended as an aid ?in the diagnosis of influenza from Nasopharyngeal swab specimens and ?should not be used as a sole basis for treatment. Nasal washings and ?aspirates are unacceptable for Xpert Xpress SARS-CoV-2/FLU/RSV ?testing. ? ?Fact Sheet for Patients: ?EntrepreneurPulse.com.au ? ?Fact Sheet for Healthcare Providers: ?IncredibleEmployment.be ? ?This test is not yet approved or cleared by the [place] FDA and ?has been authorized for detection and/or diagnosis of SARS-CoV-2 by ?FDA under an Emergency Use Authorization (EUA). This EUA will remain ?in effect (meaning this test can be used) for the duration of the ?COVID-19 declaration under Section 564(b)(1) of the Act, 21 U.S.C. ?section 360bbb-3(b)(1), unless the authorization is terminated or ?revoked. ? ?Performed at [facility], [place] [address] [place], [place] ?[address] ?  ? ? ?CT ABDOMEN PELVIS W CONTRAST ? ?Result Date: 02/17/2022 ?CLINICAL DATA:  Nausea and vomiting.  Pain with urination. EXAM: CT ABDOMEN AND PELVIS WITH CONTRAST TECHNIQUE: Multidetector CT  imaging of the abdomen and pelvis was performed using the standard protocol following bolus administration of intravenous contrast. RADIATION DOSE REDUCTION: This exam was performed according to the departmenta

## 2022-02-17 NOTE — Progress Notes (Signed)
? ?Established Patient Office Visit ? ?Subjective:  ?Patient ID: Maria Riley, female    DOB: October 09, 1991  Age: 31 y.o. MRN: [id number] ? ?CC:  ?Chief Complaint  ?Patient presents with  ? Nausea  ?  vomiting  ? ? ?HPI ?Maria Riley reports that she has been having nausea and vomiting since Sunday night, states that she has not been able to keep any fluids or food down.  Denies any sick contacts.  No one else at home with same symptoms.  Has not tried anything for relief.  Does endorse weakness. ? ?Due to language barrier, an interpreter was present during the history-taking and subsequent discussion (and for part of the physical exam) with this patient. ? ? ?Past Medical History:  ?Diagnosis Date  ? Medical history non-contributory   ? ? ?Past Surgical History:  ?Procedure Laterality Date  ? CESAREAN SECTION N/A 06/13/2018  ? Procedure: CESAREAN SECTION;  Surgeon: Aletha Halim, MD;  Location: [facility];  Service: Obstetrics;  Laterality: N/A;  ? IR CATHETER TUBE CHANGE  02/05/2022  ? IR RADIOLOGIST EVAL & MGMT  02/04/2022  ? IR RADIOLOGIST EVAL & MGMT  02/12/2022  ? MOUTH SURGERY    ? WISDOM TOOTH EXTRACTION Bilateral   ? ? ?Family History  ?Problem Relation Age of Onset  ? Healthy Father   ? Alcohol abuse Neg Hx   ? Arthritis Neg Hx   ? Asthma Neg Hx   ? Birth defects Neg Hx   ? Cancer Neg Hx   ? COPD Neg Hx   ? Depression Neg Hx   ? Diabetes Neg Hx   ? Drug abuse Neg Hx   ? Early death Neg Hx   ? Hearing loss Neg Hx   ? Heart disease Neg Hx   ? Hyperlipidemia Neg Hx   ? Hypertension Neg Hx   ? Kidney disease Neg Hx   ? Learning disabilities Neg Hx   ? Mental illness Neg Hx   ? Mental retardation Neg Hx   ? Miscarriages / Stillbirths Neg Hx   ? Stroke Neg Hx   ? Vision loss Neg Hx   ? Varicose Veins Neg Hx   ? ? ?Social History  ? ?Socioeconomic History  ? Marital status: Married  ?  Spouse name: Not on file  ? Number of children: Not on file  ? Years of education: Not on file  ? Highest education level:  Not on file  ?Occupational History  ? Not on file  ?Tobacco Use  ? Smoking status: Never  ? Smokeless tobacco: Never  ?Vaping Use  ? Vaping Use: Never used  ?Substance and Sexual Activity  ? Alcohol use: No  ? Drug use: No  ? Sexual activity: Yes  ?  Birth control/protection: None  ?Other Topics Concern  ? Not on file  ?Social History Narrative  ? Not on file  ? ?Social Determinants of Health  ? ?Financial Resource Strain: Not on file  ?Food Insecurity: Not on file  ?Transportation Needs: Not on file  ?Physical Activity: Not on file  ?Stress: Not on file  ?Social Connections: Not on file  ?Intimate Partner Violence: Not on file  ? ? ?Outpatient Medications Prior to Visit  ?Medication Sig Dispense Refill  ? [START ON 02/22/2022] apixaban (ELIQUIS) 5 MG TABS tablet Take 1 tablet (5 mg total) by mouth 2 (two) times daily. 180 tablet 0  ? APIXABAN (ELIQUIS) VTE STARTER PACK (10MG  AND 5MG ) Take as directed on  package: start with two-5mg  tablets by mouth twice daily for 7 days. On day 8, switch to one-5mg  tablet twice daily. 74 tablet 0  ? Multiple Vitamins-Minerals (CERTAVITE/ANTIOXIDANTS) TABS Take 1 tablet by mouth daily. 90 tablet 0  ? ?No facility-administered medications prior to visit.  ? ? ?Allergies  ?Allergen Reactions  ? Tape   ?  Adhesive tape- rash  ? ? ?ROS ?Review of Systems  ?Constitutional:  Positive for fatigue. Negative for chills and fever.  ?HENT: Negative.    ?Eyes: Negative.   ?Respiratory:  Negative for shortness of breath.   ?Cardiovascular:  Negative for chest pain.  ?Gastrointestinal:  Positive for nausea and vomiting. Negative for diarrhea.  ?Endocrine: Negative.   ?Genitourinary: Negative.   ?Musculoskeletal: Negative.   ?Skin: Negative.   ?Allergic/Immunologic: Negative.   ?Neurological:  Positive for weakness.  ?Hematological: Negative.   ?Psychiatric/Behavioral: Negative.    ? ?  ?Objective:  ?  ?Physical Exam ?Vitals and nursing note reviewed.  ?Constitutional:   ?   General: She is in  acute distress.  ?   Appearance: Normal appearance. She is ill-appearing.  ?HENT:  ?   Head: Normocephalic and atraumatic.  ?   Right Ear: External ear normal.  ?   Left Ear: External ear normal.  ?   Nose: Nose normal.  ?   Mouth/Throat:  ?   Mouth: Mucous membranes are moist.  ?   Pharynx: Oropharynx is clear.  ?Eyes:  ?   Extraocular Movements: Extraocular movements intact.  ?   Conjunctiva/sclera: Conjunctivae normal.  ?   Pupils: Pupils are equal, round, and reactive to light.  ?Cardiovascular:  ?   Rate and Rhythm: Regular rhythm. Tachycardia present.  ?   Pulses: Normal pulses.  ?   Heart sounds: Normal heart sounds.  ?Pulmonary:  ?   Effort: Pulmonary effort is normal.  ?   Breath sounds: Normal breath sounds.  ?Musculoskeletal:     ?   General: Normal range of motion.  ?   Cervical back: Normal range of motion and neck supple.  ?Skin: ?   General: Skin is warm.  ?   Coloration: Skin is pale.  ?Neurological:  ?   General: No focal deficit present.  ?   Mental Status: She is alert and oriented to person, place, and time.  ?Psychiatric:     ?   Mood and Affect: Mood normal.     ?   Behavior: Behavior normal.     ?   Thought Content: Thought content normal.     ?   Judgment: Judgment normal.  ? ? ?BP 92/71 (BP Location: Left Arm, Patient Position: Sitting, Cuff Size: Normal)   Pulse (!) 117   Temp 97.6 ?F (36.4 ?C) (Oral)   Resp 18   Ht 4\' 11"  (1.499 m)   Wt 125 lb (56.7 kg)   LMP 06/08/2021 (Approximate)   SpO2 100%   BMI 25.25 kg/m?  ?Wt Readings from Last 3 Encounters:  ?02/17/22 125 lb (56.7 kg)  ?02/13/22 132 lb 7.9 oz (60.1 kg)  ?02/04/22 132 lb 6.4 oz (60.1 kg)  ? ? ? ?Health Maintenance Due  ?Topic Date Due  ? Hepatitis C Screening  Never done  ? ? ?There are no preventive care reminders to display for this patient. ? ?Lab Results  ?Component Value Date  ? TSH 1.290 10/19/2017  ? ?Lab Results  ?Component Value Date  ? WBC 10.6 (H) 02/17/2022  ? HGB 12.4 02/17/2022  ?  HCT 36.8 02/17/2022  ? MCV  86.8 02/17/2022  ? PLT 422 (H) 02/17/2022  ? ?Lab Results  ?Component Value Date  ? NA 132 (L) 02/17/2022  ? K 4.3 02/17/2022  ? CO2 19 (L) 02/17/2022  ? GLUCOSE 86 02/17/2022  ? BUN 10 02/17/2022  ? CREATININE 1.07 (H) 02/17/2022  ? BILITOT 0.6 02/17/2022  ? ALKPHOS 141 (H) 02/17/2022  ? AST 38 02/17/2022  ? ALT 37 02/17/2022  ? PROT 8.5 (H) 02/17/2022  ? ALBUMIN 3.6 02/17/2022  ? CALCIUM 9.2 02/17/2022  ? ANIONGAP 11 02/17/2022  ? ?No results found for: CHOL ?No results found for: HDL ?No results found for: [place] ?No results found for: TRIG ?No results found for: CHOLHDL ?Lab Results  ?Component Value Date  ? HGBA1C 4.8 01/14/2018  ? ? ?  ?Assessment & Plan:  ? ?Problem List Items Addressed This Visit   ? ?  ? Other  ? Ileostomy in place [facility])  ? ?Other Visit Diagnoses   ? ? Nausea and vomiting, unspecified vomiting type    -  Primary  ? Tachycardia      ? ?  ? ? ?No orders of the defined types were placed in this encounter. ?1. Nausea and vomiting, unspecified vomiting type ?Patient tachycardic, pale, has not been able to keep fluids or food down since Sunday night.  Patient encouraged to present to emergency department for prompt evaluation.  Patient agrees and understands, does have a driver with her, declines EMS transport. ? ?2. Ileostomy in place [facility]) ? ? ?3. Tachycardia ? ? ? ?I have reviewed the patient's medical history (PMH, PSH, Social History, Family History, Medications, and allergies) , and have been updated if relevant. I spent 10 minutes reviewing chart and  face to face time with patient. ? ? ? ? ?Follow-up: Return if symptoms worsen or fail to improve.  ? ? ?Lillia Lengel S Mayers, PA-C ?

## 2022-02-18 ENCOUNTER — Encounter (HOSPITAL_COMMUNITY): Payer: Self-pay | Admitting: Family Medicine

## 2022-02-18 ENCOUNTER — Inpatient Hospital Stay (HOSPITAL_COMMUNITY): Payer: Medicaid Other

## 2022-02-18 DIAGNOSIS — Z86718 Personal history of other venous thrombosis and embolism: Secondary | ICD-10-CM | POA: Diagnosis not present

## 2022-02-18 DIAGNOSIS — A419 Sepsis, unspecified organism: Secondary | ICD-10-CM | POA: Diagnosis not present

## 2022-02-18 DIAGNOSIS — K651 Peritoneal abscess: Secondary | ICD-10-CM | POA: Diagnosis not present

## 2022-02-18 DIAGNOSIS — E871 Hypo-osmolality and hyponatremia: Secondary | ICD-10-CM | POA: Diagnosis not present

## 2022-02-18 DIAGNOSIS — N739 Female pelvic inflammatory disease, unspecified: Secondary | ICD-10-CM | POA: Diagnosis not present

## 2022-02-18 DIAGNOSIS — Z932 Ileostomy status: Secondary | ICD-10-CM

## 2022-02-18 HISTORY — PX: IR IMAGE GUIDED DRAINAGE BY PERCUTANEOUS CATHETER: IMG5465

## 2022-02-18 LAB — APTT
aPTT: 119 seconds — ABNORMAL HIGH (ref 24–36)
aPTT: 98 seconds — ABNORMAL HIGH (ref 24–36)

## 2022-02-18 LAB — HEPARIN LEVEL (UNFRACTIONATED): Heparin Unfractionated: >1.1 IU/mL — ABNORMAL HIGH (ref 0.30–0.70)

## 2022-02-18 LAB — LACTIC ACID, PLASMA: Lactic Acid, Venous: 0.6 mmol/L (ref 0.5–1.9)

## 2022-02-18 MED ORDER — PANTOPRAZOLE SODIUM 40 MG PO TBEC
40.0000 mg | DELAYED_RELEASE_TABLET | Freq: Every day | ORAL | Status: DC
  Administered 2022-02-18 – 2022-02-20 (×3): 40 mg via ORAL
  Filled 2022-02-18 (×3): qty 1

## 2022-02-18 MED ORDER — MIDODRINE HCL 5 MG PO TABS: 10.0000 mg | ORAL_TABLET | Freq: Three times a day (TID) | ORAL | Status: DC

## 2022-02-18 MED ORDER — MIDODRINE HCL 5 MG PO TABS
10.0000 mg | ORAL_TABLET | Freq: Three times a day (TID) | ORAL | Status: DC
Start: 2022-02-18 — End: 2022-02-20
  Administered 2022-02-18 – 2022-02-20 (×7): 10 mg via ORAL
  Filled 2022-02-18 (×7): qty 2

## 2022-02-18 MED ORDER — SODIUM CHLORIDE 0.9 % IV BOLUS
1000.0000 mL | Freq: Once | INTRAVENOUS | Status: AC
  Administered 2022-02-18: 1000 mL via INTRAVENOUS

## 2022-02-18 MED ORDER — HEPARIN (PORCINE) 25000 UT/250ML-% IV SOLN
900.0000 [IU]/h | INTRAVENOUS | Status: DC
  Administered 2022-02-18: 1000 [IU]/h via INTRAVENOUS
  Administered 2022-02-19: 850 [IU]/h via INTRAVENOUS
  Filled 2022-02-18 (×2): qty 250

## 2022-02-18 MED ORDER — LIDOCAINE HCL 1 % IJ SOLN
INTRAMUSCULAR | Status: DC | PRN
  Administered 2022-02-18: 5 mL via INTRADERMAL

## 2022-02-18 MED ORDER — LIDOCAINE HCL 1 % IJ SOLN
INTRAMUSCULAR | Status: AC
Start: 2022-02-18 — End: 2022-02-19
  Filled 2022-02-18: qty 20

## 2022-02-18 MED ORDER — IOHEXOL 300 MG/ML  SOLN
10.0000 mL | Freq: Once | INTRAMUSCULAR | Status: AC | PRN
  Administered 2022-02-18: 10 mL

## 2022-02-18 NOTE — Assessment & Plan Note (Addendum)
Secondary to pelvic abscess.  Continue IV antibiotic.  Status post Drain tube placement.  Follow blood cultures.  Follow abscess culture to discern antibiotic on discharge ?

## 2022-02-18 NOTE — Assessment & Plan Note (Addendum)
Large pelvic abscess with suspected intraperitoneal extension and mass effect on the urinary bladder.  Continue IV vancomycin and Zosyn.  Surgery and IR on board.  Patient underwent Fluoroscopy guided drain catheter placement in the left lower quadrant pelvic abscess on 02/18/2022.  Culture is still pending.  Patient did have a recurrent admission for the same so we will make sure that we have culture sensitivity and appropriate antibiotic on discharge.  Afebrile, leukocytosis has improved.  We will continue to monitor.  General surgery recommends continuation of drain on discharge.  Patient will need to follow-up with Dr. Johney Maine IR on 03/10/2022 for management of drain tube. ?

## 2022-02-18 NOTE — ED Notes (Signed)
Pt resting in bed with no complaints at this time.

## 2022-02-18 NOTE — Consult Note (Signed)
? ?Chief Complaint: ?Patient was seen in consultation today for pelvic abscess ? ?Referring Physician(s): ?Dr. Donnie Mesa ? ?Supervising Physician: Michaelle Birks ? ?Patient Status: [facility] - In-pt ? ?History of Present Illness: ?Maria Riley is a 31 y.o. female known to IR from prior pelvic drain placement due to placental abruption requiring emergent cesarean section in [place] with multiple subsequent complications resulting in right hemicolectomy, right lower quadrant ostomy creation and presumed partial hysterectomy. Her original IR drain was placed 2/17 and was manipulated and upsized 02/05/22.  At her next appointment 1 week later, there was no substantial change in her collection despite drain upsize and decision was made to remove as it was not beneficial and considered a nidus for infection. She now returns to ED with nausea, vomiting.  She is again noted to have a large pelvic fluid collection and IR consulted for drain placement.  ? ?Patient assessed at bedside with assistance from [place] interpreter. She is understanding of the request for drain replacement.  Case reviewed by Dr. Maryelizabeth Kaufmann who agrees to placement and plans to utilize existing tract. Patient is aware and is agreeable.  ?She has been NPO today.  ? ?Past Medical History:  ?Diagnosis Date  ? Medical history non-contributory   ? ? ?Past Surgical History:  ?Procedure Laterality Date  ? CESAREAN SECTION N/A 06/13/2018  ? Procedure: CESAREAN SECTION;  Surgeon: Aletha Halim, MD;  Location: [facility];  Service: Obstetrics;  Laterality: N/A;  ? IR CATHETER TUBE CHANGE  02/05/2022  ? IR RADIOLOGIST EVAL & MGMT  02/04/2022  ? IR RADIOLOGIST EVAL & MGMT  02/12/2022  ? MOUTH SURGERY    ? WISDOM TOOTH EXTRACTION Bilateral   ? ? ?Allergies: ?Tape ? ?Medications: ?Prior to Admission medications   ?Medication Sig Start Date End Date Taking? Authorizing Provider  ?apixaban (ELIQUIS) 5 MG TABS tablet Take 1 tablet (5 mg total) by mouth 2 (two) times  daily. 02/22/22 05/23/22 Yes Kayleen Memos, DO  ?APIXABAN (ELIQUIS) VTE STARTER PACK (10MG  AND 5MG ) Take as directed on package: start with two-5mg  tablets by mouth twice daily for 7 days. On day 8, switch to one-5mg  tablet twice daily. ?Patient not taking: Reported on 02/17/2022 01/26/22   Kayleen Memos, DO  ?Multiple Vitamins-Minerals (CERTAVITE/ANTIOXIDANTS) TABS Take 1 tablet by mouth daily. ?Patient not taking: Reported on 02/17/2022 01/27/22 04/27/22  Kayleen Memos, DO  ?fluticasone (FLONASE) 50 MCG/ACT nasal spray Place 1 spray into both nostrils daily. 01/02/21 04/25/21  Zigmund Gottron, NP  ?LO LOESTRIN FE 1 MG-10 MCG / 10 MCG tablet Take 1 tablet by mouth daily. 03/06/20 07/06/20  Shelly Bombard, MD  ?medroxyPROGESTERone (DEPO-PROVERA) 150 MG/ML injection Inject 1 mL (150 mg total) into the muscle every 3 (three) months. ?Patient not taking: Reported on 11/14/2018 07/18/18 07/06/20  Shelly Bombard, MD  ?  ? ?Family History  ?Problem Relation Age of Onset  ? Healthy Father   ? Alcohol abuse Neg Hx   ? Arthritis Neg Hx   ? Asthma Neg Hx   ? Birth defects Neg Hx   ? Cancer Neg Hx   ? COPD Neg Hx   ? Depression Neg Hx   ? Diabetes Neg Hx   ? Drug abuse Neg Hx   ? Early death Neg Hx   ? Hearing loss Neg Hx   ? Heart disease Neg Hx   ? Hyperlipidemia Neg Hx   ? Hypertension Neg Hx   ? Kidney disease Neg Hx   ?  Learning disabilities Neg Hx   ? Mental illness Neg Hx   ? Mental retardation Neg Hx   ? Miscarriages / Stillbirths Neg Hx   ? Stroke Neg Hx   ? Vision loss Neg Hx   ? Varicose Veins Neg Hx   ? ? ?Social History  ? ?Socioeconomic History  ? Marital status: Married  ?  Spouse name: Not on file  ? Number of children: Not on file  ? Years of education: Not on file  ? Highest education level: Not on file  ?Occupational History  ? Not on file  ?Tobacco Use  ? Smoking status: Never  ? Smokeless tobacco: Never  ?Vaping Use  ? Vaping Use: Never used  ?Substance and Sexual Activity  ? Alcohol use: No  ? Drug use: No  ?  Sexual activity: Yes  ?  Birth control/protection: None  ?Other Topics Concern  ? Not on file  ?Social History Narrative  ? Not on file  ? ?Social Determinants of Health  ? ?Financial Resource Strain: Not on file  ?Food Insecurity: Not on file  ?Transportation Needs: Not on file  ?Physical Activity: Not on file  ?Stress: Not on file  ?Social Connections: Not on file  ? ? ? ?Review of Systems: A 12 point ROS discussed and pertinent positives are indicated in the HPI above.  All other systems are negative. ? ?Review of Systems  ?Constitutional:  Positive for fatigue. Negative for fever.  ?Respiratory:  Negative for cough and shortness of breath.   ?Cardiovascular:  Negative for chest pain.  ?Gastrointestinal:  Positive for abdominal pain, nausea and vomiting.  ?Musculoskeletal:  Negative for back pain.  ?Psychiatric/Behavioral:  Negative for behavioral problems and confusion.   ? ?Vital Signs: ?BP (!) 82/50 (BP Location: Left Arm)   Pulse 80   Temp 98.3 ?F (36.8 ?C) (Oral)   Resp 20   Ht 4\' 11"  (1.499 m)   Wt 125 lb (56.7 kg)   LMP 06/08/2021 (Approximate)   SpO2 98%   Breastfeeding Unknown   BMI 25.25 kg/m?  ? ?Physical Exam ?Vitals and nursing note reviewed.  ?Constitutional:   ?   General: She is not in acute distress. ?   Appearance: Normal appearance. She is not ill-appearing.  ?HENT:  ?   Mouth/Throat:  ?   Mouth: Mucous membranes are moist.  ?   Pharynx: Oropharynx is clear.  ?Cardiovascular:  ?   Rate and Rhythm: Normal rate and regular rhythm.  ?Pulmonary:  ?   Effort: Pulmonary effort is normal.  ?   Breath sounds: Normal breath sounds.  ?Abdominal:  ?   General: Abdomen is flat.  ?   Palpations: Abdomen is soft.  ?Skin: ?   General: Skin is warm and dry.  ?Neurological:  ?   General: No focal deficit present.  ?   Mental Status: She is alert and oriented to person, place, and time. Mental status is at baseline.  ?Psychiatric:     ?   Mood and Affect: Mood normal.     ?   Behavior: Behavior normal.      ?   Thought Content: Thought content normal.     ?   Judgment: Judgment normal.  ? ? ? ?MD Evaluation ?Airway: WNL ?Heart: WNL ?Abdomen: WNL ?Chest/ Lungs: WNL ?ASA  Classification: 3 ?Mallampati/Airway Score: Two ? ? ?Imaging: ?CT Angio Chest Pulmonary Embolism (PE) W or WO Contrast ? ?Addendum Date: 01/23/2022   ?ADDENDUM REPORT: 01/23/2022 23:42 ADDENDUM:  Critical findings were reported to NP Lovey Newcomer at 11:41 p.m. Electronically Signed   By: Brett Fairy M.D.   On: 01/23/2022 23:42  ? ?Result Date: 01/23/2022 ?CLINICAL DATA:  Pulmonary embolism suspected, positive D-dimer. Known bilateral DVTs. EXAM: CT ANGIOGRAPHY CHEST WITH CONTRAST TECHNIQUE: Multidetector CT imaging of the chest was performed using the standard protocol during bolus administration of intravenous contrast. Multiplanar CT image reconstructions and MIPs were obtained to evaluate the vascular anatomy. RADIATION DOSE REDUCTION: This exam was performed according to the departmental dose-optimization program which includes automated exposure control, adjustment of the mA and/or kV according to patient size and/or use of iterative reconstruction technique. CONTRAST:  86mL OMNIPAQUE IOHEXOL 350 MG/ML SOLN COMPARISON:  01/22/2022. FINDINGS: Cardiovascular: The heart is normal in size and there is no pericardial effusion. The aorta and pulmonary trunk are normal in caliber. Filling defects are noted in the left pulmonary artery extending into the left upper and lower lobe lobar and segmental arteries. Segmental and subsegmental pulmonary artery filling defects are present in the right lower lobe. No evidence of right heart strain. Mediastinum/Nodes: No enlarged mediastinal, hilar, or axillary lymph nodes. Thyroid gland, trachea, and esophagus demonstrate no significant findings. Lungs/Pleura: Lungs are clear. No pleural effusion or pneumothorax. Upper Abdomen: No acute abnormality. Musculoskeletal: No chest wall abnormality. No acute or  significant osseous findings. Review of the MIP images confirms the above findings. IMPRESSION: Bilateral pulmonary emboli, moderate thrombus burden. No evidence of right heart strain. Electronically Signed: By: Mickel Baas

## 2022-02-18 NOTE — Hospital Course (Addendum)
Maria Riley is a 31 y.o. female with past medical history of lower extremity DVT and pelvic abscess which was treated with IR guided drainage on 01/22/2022 with history of emergent C-section and subsequent multiple abdominal surgeries requiring right hemicolectomy and end ileostomy.  Patient presented to hospital with nausea, vomiting with dysuria, urinary frequency and urgency.  In the ED, patient was noted to be hypotensive and tachycardic. CBC showed WBC at 18.6.  Urinalysis showed protein.  EKG showed normal sinus rhythm with tachycardia.  Abdominal and pelvic CT scan showed  rim enhancing collection in the pelvis on the left with air-fluid levels measuring 6.5 x 3.3 x 4.7 cm, increased in size from the prior exam with significantly increased surrounding inflammatory changes with marked Mass effect on the urinary bladder with diffuse bladder wall thickening, possible infectious or inflammatory cystitis related to local inflammatory changes.  General surgery was consulted and the patient was admitted hospital for evaluation and treatment. ?

## 2022-02-18 NOTE — Plan of Care (Signed)

## 2022-02-18 NOTE — Assessment & Plan Note (Addendum)
Mild on presentation.  Resolved at this time. ?

## 2022-02-18 NOTE — Assessment & Plan Note (Addendum)
Patient was on heparin infusion.  Will change to Eliquis starting today.  No further intervention planned at this time. ?

## 2022-02-18 NOTE — Progress Notes (Signed)
PROGRESS NOTE    Maria Riley  JXB:[id number] DOB: Mar 24, 1991 DOA: 02/17/2022 PCP: [name], NP    Brief Narrative:  Maria Riley is a 31 y.o. female with past medical history of lower extremity DVT and pelvic abscess which was treated with IR guided drainage on 01/22/2022 with history of emergent C-section and subsequent multiple abdominal surgeries requiring right hemicolectomy and end ileostomy.  Patient presented to hospital with nausea vomiting with dysuria urinary frequency and urgency.  In the ED patient was noted to be hypotensive and tachycardic.  Left lower quadrant abdominal wound was draining as well.  CBC showed WBC at 18.6.  UA showed protein.  EKG showed normal sinus rhythm with tachycardia.  Abdominal and pelvic CT scan showed  rim enhancing collection in the pelvis on the left with air-fluid levels measuring 6.5 x 3.3 x 4.7 cm, increased in size from the prior exam with significantly increased surrounding inflammatory changes.  With marked. Mass effect on the urinary bladder with diffuse bladder wall thickening, possible infectious or inflammatory cystitis related to local inflammatory changes.  Patient was given 2 L bolus of IV lactated Ringer, IV Zosyn and meclizine, and 4 mg of IV Zofran.  General surgery  Dr. Freida Busman recommended IR consult to evaluate the patient for percutaneous drainage, and continue antibiotic therapy.  Patient was then admitted hospital for further evaluation and treatment.     Assessment and Plan: * Pelvic abscess in female Large pelvic abscess with suspected intraperitoneal extension and mass effect on the urinary bladder.  Continue IV vancomycin and Zosyn.  Follow general surgery and IR consult.  IR has been considered for  percutaneous drainage.  Sepsis (HCC) Secondary to pelvic abscess.  Continue IV antibiotic.  IR has been consulted for possible drainage.  Follow-up blood cultures.  Hyponatremia Mild on presentation.  Most likely  hypovolemic from her volume depletion and dehydration.  Continue with normal saline.  Check BMP in AM.  History of DVT (deep vein thrombosis) On heparin infusion at this time.  Eliquis on hold.  Ileostomy in place [facility]) History of right hemicolectomy and end ileostomy from previous multiple abdominal surgeries and emergent C-section in the past.  General surgery following.     DVT prophylaxis:   Heparin drip.   Code Status:     Code Status: Full Code  Disposition: Likely home  Status is: Inpatient Remains inpatient appropriate because: Pelvic abscess needing intervention, IV antibiotic   Family Communication: Spoke with the patient at bedside  Consultants:  General surgery IR  Procedures:  None so far  Antimicrobials:  Vancomycin and Zosyn  Anti-infectives (From admission, onward)    Start     Dose/Rate Route Frequency Ordered Stop   02/18/22 2200  vancomycin (VANCOCIN) IVPB 1000 mg/200 mL premix        1,000 mg 200 mL/hr over 60 Minutes Intravenous Every 24 hours 02/17/22 2206     02/18/22 0600  piperacillin-tazobactam (ZOSYN) IVPB 3.375 g        3.375 g 12.5 mL/hr over 240 Minutes Intravenous Every 8 hours 02/17/22 2206     02/17/22 2115  vancomycin (VANCOCIN) IVPB 1000 mg/200 mL premix  Status:  Discontinued        1,000 mg 200 mL/hr over 60 Minutes Intravenous  Once 02/17/22 2113 02/17/22 2116   02/17/22 2115  piperacillin-tazobactam (ZOSYN) IVPB 3.375 g  Status:  Discontinued        3.375 g 100 mL/hr over 30 Minutes Intravenous  Once  02/17/22 2113 02/17/22 2116   02/17/22 2100  piperacillin-tazobactam (ZOSYN) IVPB 3.375 g        3.375 g 100 mL/hr over 30 Minutes Intravenous  Once 02/17/22 2056 02/17/22 2302   02/17/22 2100  vancomycin (VANCOCIN) IVPB 1000 mg/200 mL premix        1,000 mg 200 mL/hr over 60 Minutes Intravenous  Once 02/17/22 2056 02/18/22 0009       Subjective: Today, patient was seen and examined at bedside.  General surgery in the room.   Communicated with interpreter.  Patient reported no nausea or vomiting but lower abdominal pain.  Objective: Vitals:   02/18/22 0548 02/18/22 0756 02/18/22 0816 02/18/22 1200  BP: (!) 82/56 (!) 86/56 (!) 86/55 (!) 82/50  Pulse: 90  92 80  Resp: 17 18 19 20   Temp: 100 F (37.8 C)  98.2 F (36.8 C) 98.3 F (36.8 C)  TempSrc: Oral  Oral Oral  SpO2: 99%  95% 98%    Intake/Output Summary (Last 24 hours) at 02/18/2022 1255 Last data filed at 02/17/2022 2302 Gross per 24 hour  Intake 500 ml  Output --  Net 500 ml   There were no vitals filed for this visit.  Physical Examination: There is no height or weight on file to calculate BMI.   General:  Average built, not in obvious distress, alert awake and communicative. HENT:   No scleral pallor or icterus noted. Oral mucosa is moist.  Chest:  Diminished breath sounds bilaterally.  CVS: S1 &S2 heard. No murmur.  Regular rate and rhythm. Abdomen: Multiple healed scar, previous IR drain site with purulent drainage, ileostomy bag in place. Extremities: No cyanosis, clubbing or edema.  Peripheral pulses are palpable. Psych: Alert, awake and oriented, normal mood CNS:  No cranial nerve deficits.  Power equal in all extremities.   Skin: Warm and dry.   Data Reviewed:   CBC: Recent Labs  Lab 02/17/22 1315  WBC 10.6*  HGB 12.4  HCT 36.8  MCV 86.8  PLT 422*    Basic Metabolic Panel: Recent Labs  Lab 02/17/22 1315  NA 132*  K 4.3  CL 102  CO2 19*  GLUCOSE 86  BUN 10  CREATININE 1.07*  CALCIUM 9.2    Liver Function Tests: Recent Labs  Lab 02/17/22 1315  AST 38  ALT 37  ALKPHOS 141*  BILITOT 0.6  PROT 8.5*  ALBUMIN 3.6     Radiology Studies: CT ABDOMEN PELVIS W CONTRAST  Result Date: 02/17/2022 CLINICAL DATA:  Nausea and vomiting.  Pain with urination. EXAM: CT ABDOMEN AND PELVIS WITH CONTRAST TECHNIQUE: Multidetector CT imaging of the abdomen and pelvis was performed using the standard protocol following bolus  administration of intravenous contrast. RADIATION DOSE REDUCTION: This exam was performed according to the departmental dose-optimization program which includes automated exposure control, adjustment of the mA and/or kV according to patient size and/or use of iterative reconstruction technique. CONTRAST:  75mL OMNIPAQUE IOHEXOL 350 MG/ML SOLN COMPARISON:  02/04/2022. FINDINGS: Lower chest: No acute abnormality. Hepatobiliary: There is focal fatty infiltration of the liver adjacent to the falciform ligament. No biliary ductal dilatation. The gallbladder is without stones. Pancreas: Unremarkable. No pancreatic ductal dilatation or surrounding inflammatory changes. Spleen: The spleen is stable in size and morphology with a lobular contour. Adrenals/Urinary Tract: No adrenal nodule or mass. No renal calculus or hydronephrosis. The kidneys enhance symmetrically. Diffuse bladder wall thickening is noted. There is persistent mass effect on the bladder with rightward shift. Stomach/Bowel: Right  hemicolectomy changes are noted with a right lower quadrant ostomy. No bowel obstruction. Mild diffuse gastric wall thickening is noted, possible gastritis. Vascular/Lymphatic: No significant vascular findings are present. A few prominent lymph nodes are noted in the pelvis which are likely reactive. Reproductive: Presumed hysterectomy changes are noted in the pelvis. The adnexal regions are not well delineated to a fat stranding. Other: There is a rim enhancing collection in the pelvis on the left with air-fluid levels and extension into the subcutaneous fat in the anterior abdominal wall. The collection measures 6.5 x 3.3 x 4.7 cm, increased in size from the prior exam. There is significantly increased fat stranding in the pelvis with loculated foci of air in the peritoneum external to the abscess wall and concerning for local extension of abscess into the peritoneum. Musculoskeletal: No acute osseous abnormality. IMPRESSION: 1.  Rim enhancing collection in the pelvis on the left with air-fluid levels measuring 6.5 x 3.3 x 4.7 cm, increased in size from the prior exam with significantly increased surrounding inflammatory changes. There is extension of the collection with loculated foci of air into the subcutaneous fat in the anterior abdominal wall and also loculated foci of air external to the abscess wall in the peritoneum concerning for intraperitoneal extension and/or new local abscess formation. 2. Mass effect on the urinary bladder with diffuse bladder wall thickening, possible infectious or inflammatory cystitis related to local inflammatory changes. 3. Mild gastric wall thickening, possible gastritis. Electronically Signed   By: Thornell Sartorius M.D.   On: 02/17/2022 20:25      LOS: 1 day    Joycelyn Das, MD [facility] 02/18/2022, 12:55 PM

## 2022-02-18 NOTE — Progress Notes (Signed)
? ?Progress Note ? ?   ?Subjective: ?No further nausea or emesis this morning. She states she has no abdominal pain but is tender on exam. Having normal ileostomy output. She has had purulent drainage from IR drain site since Thursday. No other complaints ? ?Objective: ?Vital signs in last 24 hours: ?Temp:  [97.6 ?F (36.4 ?C)-100 ?F (37.8 ?C)] 98.2 ?F (36.8 ?C) (03/15 7425) ?Pulse Rate:  [85-121] 92 (03/15 0816) ?Resp:  [12-27] 19 (03/15 0816) ?BP: (72-93)/(51-73) 86/55 (03/15 9563) ?SpO2:  [95 %-100 %] 95 % (03/15 0816) ?Weight:  [56.7 kg] 56.7 kg (03/14 1209) ?Last BM Date : 02/18/22 ? ?Intake/Output from previous day: ?03/14 0701 - 03/15 0700 ?In: 500 [IV Piggyback:500] ?Out: -  ?Intake/Output this shift: ?No intake/output data recorded. ? ?PE: ?General: pleasant, WD, female who is laying in bed in NAD ?HEENT: head is normocephalic, atraumatic. Mouth is pink and moist ?Heart: regular, rate, and rhythm. Palpable radial pulses bilaterally ?Lungs: Respiratory effort nonlabored on room air ?Abd: soft, ND, +BS, ileostomy full with gas and pink fluid. Multiple healed surgical scars. Site of prior IR drain near prior pfannenstiel incision with active purulent drainage ?MSK: all 4 extremities are symmetrical with no cyanosis, clubbing, or edema. ?Skin: warm and dry ?Psych: A&Ox3 with an appropriate affect.  ? ? ?Lab Results:  ?Recent Labs  ?  02/17/22 ?1315  ?WBC 10.6*  ?HGB 12.4  ?HCT 36.8  ?PLT 422*  ? ?BMET ?Recent Labs  ?  02/17/22 ?1315  ?NA 132*  ?K 4.3  ?CL 102  ?CO2 19*  ?GLUCOSE 86  ?BUN 10  ?CREATININE 1.07*  ?CALCIUM 9.2  ? ?PT/INR ?No results for input(s): LABPROT, INR in the last 72 hours. ?CMP  ?   ?Component Value Date/Time  ? NA 132 (L) 02/17/2022 1315  ? K 4.3 02/17/2022 1315  ? CL 102 02/17/2022 1315  ? CO2 19 (L) 02/17/2022 1315  ? GLUCOSE 86 02/17/2022 1315  ? BUN 10 02/17/2022 1315  ? CREATININE 1.07 (H) 02/17/2022 1315  ? CALCIUM 9.2 02/17/2022 1315  ? PROT 8.5 (H) 02/17/2022 1315  ? ALBUMIN 3.6  02/17/2022 1315  ? AST 38 02/17/2022 1315  ? ALT 37 02/17/2022 1315  ? ALKPHOS 141 (H) 02/17/2022 1315  ? BILITOT 0.6 02/17/2022 1315  ? GFRNONAA >60 02/17/2022 1315  ? GFRAA >60 07/08/2018 1640  ? ?Lipase  ?   ?Component Value Date/Time  ? LIPASE 38 02/17/2022 1315  ? ? ? ? ? ?Studies/Results: ?CT ABDOMEN PELVIS W CONTRAST ? ?Result Date: 02/17/2022 ?CLINICAL DATA:  Nausea and vomiting.  Pain with urination. EXAM: CT ABDOMEN AND PELVIS WITH CONTRAST TECHNIQUE: Multidetector CT imaging of the abdomen and pelvis was performed using the standard protocol following bolus administration of intravenous contrast. RADIATION DOSE REDUCTION: This exam was performed according to the departmental dose-optimization program which includes automated exposure control, adjustment of the mA and/or kV according to patient size and/or use of iterative reconstruction technique. CONTRAST:  19mL OMNIPAQUE IOHEXOL 350 MG/ML SOLN COMPARISON:  02/04/2022. FINDINGS: Lower chest: No acute abnormality. Hepatobiliary: There is focal fatty infiltration of the liver adjacent to the falciform ligament. No biliary ductal dilatation. The gallbladder is without stones. Pancreas: Unremarkable. No pancreatic ductal dilatation or surrounding inflammatory changes. Spleen: The spleen is stable in size and morphology with a lobular contour. Adrenals/Urinary Tract: No adrenal nodule or mass. No renal calculus or hydronephrosis. The kidneys enhance symmetrically. Diffuse bladder wall thickening is noted. There is persistent mass effect on the  bladder with rightward shift. Stomach/Bowel: Right hemicolectomy changes are noted with a right lower quadrant ostomy. No bowel obstruction. Mild diffuse gastric wall thickening is noted, possible gastritis. Vascular/Lymphatic: No significant vascular findings are present. A few prominent lymph nodes are noted in the pelvis which are likely reactive. Reproductive: Presumed hysterectomy changes are noted in the pelvis.  The adnexal regions are not well delineated to a fat stranding. Other: There is a rim enhancing collection in the pelvis on the left with air-fluid levels and extension into the subcutaneous fat in the anterior abdominal wall. The collection measures 6.5 x 3.3 x 4.7 cm, increased in size from the prior exam. There is significantly increased fat stranding in the pelvis with loculated foci of air in the peritoneum external to the abscess wall and concerning for local extension of abscess into the peritoneum. Musculoskeletal: No acute osseous abnormality. IMPRESSION: 1. Rim enhancing collection in the pelvis on the left with air-fluid levels measuring 6.5 x 3.3 x 4.7 cm, increased in size from the prior exam with significantly increased surrounding inflammatory changes. There is extension of the collection with loculated foci of air into the subcutaneous fat in the anterior abdominal wall and also loculated foci of air external to the abscess wall in the peritoneum concerning for intraperitoneal extension and/or new local abscess formation. 2. Mass effect on the urinary bladder with diffuse bladder wall thickening, possible infectious or inflammatory cystitis related to local inflammatory changes. 3. Mild gastric wall thickening, possible gastritis. Electronically Signed   By: Thornell Sartorius M.D.   On: 02/17/2022 20:25   ? ?Anti-infectives: ?Anti-infectives (From admission, onward)  ? ? Start     Dose/Rate Route Frequency Ordered Stop  ? 02/18/22 2200  vancomycin (VANCOCIN) IVPB 1000 mg/200 mL premix       ? 1,000 mg ?200 mL/hr over 60 Minutes Intravenous Every 24 hours 02/17/22 2206    ? 02/18/22 0600  piperacillin-tazobactam (ZOSYN) IVPB 3.375 g       ? 3.375 g ?12.5 mL/hr over 240 Minutes Intravenous Every 8 hours 02/17/22 2206    ? 02/17/22 2115  vancomycin (VANCOCIN) IVPB 1000 mg/200 mL premix  Status:  Discontinued       ? 1,000 mg ?200 mL/hr over 60 Minutes Intravenous  Once 02/17/22 2113 02/17/22 2116  ?  02/17/22 2115  piperacillin-tazobactam (ZOSYN) IVPB 3.375 g  Status:  Discontinued       ? 3.375 g ?100 mL/hr over 30 Minutes Intravenous  Once 02/17/22 2113 02/17/22 2116  ? 02/17/22 2100  piperacillin-tazobactam (ZOSYN) IVPB 3.375 g       ? 3.375 g ?100 mL/hr over 30 Minutes Intravenous  Once 02/17/22 2056 02/17/22 2302  ? 02/17/22 2100  vancomycin (VANCOCIN) IVPB 1000 mg/200 mL premix       ? 1,000 mg ?200 mL/hr over 60 Minutes Intravenous  Once 02/17/22 2056 02/18/22 0009  ? ?  ? ? ? ?Assessment/Plan ?S/p multiple abdominal surgeries in [place] - C-section, right hemicolectomy and ileostomy  ?Pelvic abscess ?- followed by [place] prev admission last month for pelvic abscess. Had IR drain placed 2/17 that has since been removed. IR evaluated 3/2 and 3/9 without findings of fistula  ?- CT 3/14 with persistence of pelvic abscess as well as evidence of gastritis ?- PPI for gastritis ?- IR to evaluate for repeat drain placement ?- continue IV abx and bowel rest for now. Pending IR eval likely okay for clear today ? ?FEN: NPO for now ?ID: zosyn/vanc ?VTE: SQH ? ?  I reviewed hospitalist notes, last 24 h vitals and pain scores, last 48 h intake and output, last 24 h labs and trends, and last 24 h imaging results. ? ?Due to language barrier, an interpreter was present during the history-taking and subsequent discussion (and for part of the physical exam) with this patient. ? ? ? LOS: 1 day  ? ?Eric FormMartha H Ellinor Test, PA-C ?[facility] ?02/18/2022, 8:29 AM ?Please see Amion for pager number during day hours 7:00am-4:30pm  ?

## 2022-02-18 NOTE — ED Notes (Addendum)
Pt ambulated with assistance from mother to the bathroom. ?

## 2022-02-18 NOTE — Progress Notes (Signed)
ANTICOAGULATION CONSULT NOTE - Initial Consult ? ?Pharmacy Consult for Heparin (Apixaban on hold) ?Indication: History of DVT ? ?Allergies  ?Allergen Reactions  ? Tape   ?  Adhesive tape- rash  ? ? ? ?Vital Signs: ?Temp: 98 ?F (36.7 ?C) (03/15 0133) ?Temp Source: Oral (03/15 0133) ?BP: 89/57 (03/15 0245) ?Pulse Rate: 85 (03/15 0245) ? ?Labs: ?Recent Labs  ?  02/17/22 ?1315  ?HGB 12.4  ?HCT 36.8  ?PLT 422*  ?CREATININE 1.07*  ? ? ?Estimated Creatinine Clearance: 59.5 mL/min (A) (by C-G formula based on SCr of 1.07 mg/dL (H)). ? ? ?Medical History: ?Past Medical History:  ?Diagnosis Date  ? Medical history non-contributory   ? ? ? ? ?Assessment: ?31 y/o F with pelvic abscess. History of DVT on Apixaban. Holding Apixaban and starting heparin in case procedures are needed. CBC/renal function good. Anticipate using aPTT to dose for now.  ? ?Goal of Therapy:  ?Heparin level 0.3-0.7 units/ml ?aPTT 66-102 seconds ?Monitor platelets by anticoagulation protocol: Yes ?  ?Plan:  ?Start heparin drip at 1000 units/hr ?1000 aPTT and heparin level ? ?Narda Bonds, PharmD, BCPS ?Clinical Pharmacist ?Phone: [phone] ? ? ? ?

## 2022-02-18 NOTE — Procedures (Signed)
Vascular and Interventional Radiology Procedure Note ? ?Patient: Maria Riley ?DOB: [birth year] ?Medical Record Number: [id number] ?Note Date/Time: 02/18/22 5:35 PM  ? ?Performing Physician: Michaelle Birks, MD ?Assistant(s): None ? ?Diagnosis: LLQ Pelvic Abscess ? ?Procedure:  ?DRAINAGE CATHETER PLACEMENT into LLQ PELVIC ABSCESS ? ?Anesthesia: Local Anesthetic ?Complications: None ?Estimated Blood Loss: Minimal ?Specimens: Sent for Gram Stain, Aerobe Culture, and Anerobe Culture ? ?Findings:  ?Successful Fluoroscopy-guided placement of 14 F catheter into LLQ pelvic abscess. ? ?Plan:  ?- Flush drain with 10 mL Normal Saline every 12 hours. ?- Follow up drain evaluation / sinogram in 2 week(s). ? ?See detailed procedure note with images in PACS. ?The patient tolerated the procedure well without incident or complication and was returned to Floor Bed in stable condition.  ? ? ?Michaelle Birks, MD ?[facility] ?[facility] ? ? ?Pager. [phone] ?Clinic. [phone]  ?

## 2022-02-18 NOTE — Progress Notes (Signed)
ANTICOAGULATION CONSULT NOTE - Follow Up Consult ? ?Pharmacy Consult for Heparin (Apixaban on hold) ?Indication: hx bilateral DVT in 01/22/22 ? ?Allergies  ?Allergen Reactions  ? Tape   ?  Adhesive tape- rash  ? ? ?Patient Measurements: ?Height: 4\' 11"  (149.9 cm) ?Weight: 56.7 kg (125 lb) ?IBW/kg (Calculated) : 43.2 ?Heparin Dosing Weight: 56.7 kg ? ?Vital Signs: ?Temp: 98.3 ?F (36.8 ?C) (03/15 1200) ?Temp Source: Oral (03/15 1200) ?BP: 82/50 (03/15 1200) ?Pulse Rate: 80 (03/15 1200) ? ?Labs: ?Recent Labs  ?  02/17/22 ?1315 02/18/22 ?1018  ?HGB 12.4  --   ?HCT 36.8  --   ?PLT 422*  --   ?APTT  --  119*  ?HEPARINUNFRC  --  >1.10*  ?CREATININE 1.07*  --   ? ? ?Estimated Creatinine Clearance: 59.5 mL/min (A) (by C-G formula based on SCr of 1.07 mg/dL (H)). ? ?Assessment: ?31 y/o F with pelvic abscess. 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 Holding Apixaban and started IV heparin in case procedures are needed. Using aPTTs for monitoring while heparin levels are falsely elevated due to recent Apixaban doses.  Last Apixaban dose 3/13 at 10am. ? ? aPTT is 119 seconds on heparin at 1000 units/hr. Above goal. ? Heparin level >1.10 is falsely elevated. ? ?Goal of Therapy:  ?Heparin level 0.3-0.7 units/ml ?aPTT 66-102 seconds ?Monitor platelets by anticoagulation protocol: Yes ?  ?Plan:  ?Decrease heparin drip to 900 units/hr ?aPTT ~6 hours after rate change. ?Daily aPTT and heparin level until correlating; daily CBC. ?Apixaban on hold. ? ?Arty Baumgartner, RPh ?02/18/2022,1:24 PM ? ? ?

## 2022-02-18 NOTE — Assessment & Plan Note (Addendum)
History of right hemicolectomy and end ileostomy from previous multiple abdominal surgeries and emergent C-section in the past.  Functioning. ?

## 2022-02-18 NOTE — Progress Notes (Addendum)
ANTICOAGULATION CONSULT NOTE - Follow Up Consult ? ?Pharmacy Consult for Heparin (Apixaban on hold) ?Indication: hx bilateral DVT in 01/22/22 ? ?Allergies  ?Allergen Reactions  ? Tape   ?  Adhesive tape- rash  ? ? ?Patient Measurements: ?Height: 4\' 11"  (149.9 cm) ?Weight: 56.7 kg (125 lb) ?IBW/kg (Calculated) : 43.2 ?Heparin Dosing Weight: 56.7 kg ? ?Vital Signs: ?Temp: 98.4 ?F (36.9 ?C) (03/15 1950) ?Temp Source: Oral (03/15 1950) ?BP: 85/54 (03/15 1950) ?Pulse Rate: 71 (03/15 1950) ? ?Labs: ?Recent Labs  ?  02/17/22 ?1315 02/18/22 ?1018 02/18/22 ?2002  ?HGB 12.4  --   --   ?HCT 36.8  --   --   ?PLT 422*  --   --   ?APTT  --  119* 98*  ?HEPARINUNFRC  --  >1.10*  --   ?CREATININE 1.07*  --   --   ? ? ?Estimated Creatinine Clearance: 59.5 mL/min (A) (by C-G formula based on SCr of 1.07 mg/dL (H)). ? ?Assessment: ?31 y/o F with pelvic abscess. 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 Holding Apixaban and started IV heparin in case procedures are needed. Using aPTTs for monitoring while heparin levels are falsely elevated due to recent Apixaban doses.  Last Apixaban dose 3/13 at 10am. ? ?aPTT is 98 seconds at high end of therapeutic on heparin at 1000 units/hr.  No issues with infusion or bleeding per RN. ? ? ?Goal of Therapy:  ?Heparin level 0.3-0.7 units/ml ?aPTT 66-102 seconds ?Monitor platelets by anticoagulation protocol: Yes ?  ?Plan:  ?Decrease heparin drip to 850 units/hr  ?Daily aPTT and heparin level until correlating; daily CBC. ?F/u restart Apixaban  ? ?4/13, PharmD, BCPS, BCCP ?Clinical Pharmacist ? ?Please check AMION for all [facility] phone numbers ?After 10:00 PM, call Main Pharmacy [phone] ? ?

## 2022-02-19 DIAGNOSIS — Z86718 Personal history of other venous thrombosis and embolism: Secondary | ICD-10-CM | POA: Diagnosis not present

## 2022-02-19 DIAGNOSIS — Z932 Ileostomy status: Secondary | ICD-10-CM | POA: Diagnosis not present

## 2022-02-19 DIAGNOSIS — N739 Female pelvic inflammatory disease, unspecified: Secondary | ICD-10-CM | POA: Diagnosis not present

## 2022-02-19 DIAGNOSIS — A419 Sepsis, unspecified organism: Secondary | ICD-10-CM | POA: Diagnosis not present

## 2022-02-19 DIAGNOSIS — K651 Peritoneal abscess: Secondary | ICD-10-CM | POA: Diagnosis not present

## 2022-02-19 DIAGNOSIS — E871 Hypo-osmolality and hyponatremia: Secondary | ICD-10-CM | POA: Diagnosis not present

## 2022-02-19 LAB — BASIC METABOLIC PANEL
Anion gap: 13 (ref 5–15)
BUN: <5 mg/dL — ABNORMAL LOW (ref 6–20)
CO2: 16 mmol/L — ABNORMAL LOW (ref 22–32)
Calcium: 7.9 mg/dL — ABNORMAL LOW (ref 8.9–10.3)
Chloride: 108 mmol/L (ref 98–111)
Creatinine, Ser: 0.74 mg/dL (ref 0.44–1.00)
GFR, Estimated: >60 mL/min (ref >60)
Glucose, Bld: 70 mg/dL (ref 70–99)
Potassium: 3.3 mmol/L — ABNORMAL LOW (ref 3.5–5.1)
Sodium: 137 mmol/L (ref 135–145)

## 2022-02-19 LAB — CBC
HCT: 29.1 % — ABNORMAL LOW (ref 36.0–46.0)
Hemoglobin: 9.5 g/dL — ABNORMAL LOW (ref 12.0–15.0)
MCH: 29.1 pg (ref 26.0–34.0)
MCHC: 32.6 g/dL (ref 30.0–36.0)
MCV: 89 fL (ref 80.0–100.0)
Platelets: 309 10*3/uL (ref 150–400)
RBC: 3.27 MIL/uL — ABNORMAL LOW (ref 3.87–5.11)
RDW: 14.2 % (ref 11.5–15.5)
WBC: 8.4 10*3/uL (ref 4.0–10.5)
nRBC: 0 % (ref 0.0–0.2)

## 2022-02-19 LAB — URINE CULTURE

## 2022-02-19 LAB — HEPARIN LEVEL (UNFRACTIONATED): Heparin Unfractionated: 0.44 IU/mL (ref 0.30–0.70)

## 2022-02-19 LAB — APTT: aPTT: 65 seconds — ABNORMAL HIGH (ref 24–36)

## 2022-02-19 LAB — PHOSPHORUS: Phosphorus: 3 mg/dL (ref 2.5–4.6)

## 2022-02-19 LAB — MAGNESIUM: Magnesium: 1.7 mg/dL (ref 1.7–2.4)

## 2022-02-19 MED ORDER — APIXABAN 5 MG PO TABS
5.0000 mg | ORAL_TABLET | Freq: Two times a day (BID) | ORAL | Status: DC
  Administered 2022-02-19 – 2022-02-20 (×3): 5 mg via ORAL
  Filled 2022-02-19 (×3): qty 1

## 2022-02-19 MED ORDER — POTASSIUM CHLORIDE 10 MEQ/100ML IV SOLN
10.0000 meq | Freq: Once | INTRAVENOUS | Status: AC
  Administered 2022-02-19: 10 meq via INTRAVENOUS
  Filled 2022-02-19: qty 100

## 2022-02-19 MED ORDER — VANCOMYCIN HCL 750 MG/150ML IV SOLN
750.0000 mg | Freq: Two times a day (BID) | INTRAVENOUS | Status: DC
  Administered 2022-02-19 – 2022-02-20 (×2): 750 mg via INTRAVENOUS
  Filled 2022-02-19 (×4): qty 150

## 2022-02-19 MED ORDER — POTASSIUM CHLORIDE 10 MEQ/100ML IV SOLN
10.0000 meq | INTRAVENOUS | Status: AC
  Administered 2022-02-19 (×3): 10 meq via INTRAVENOUS
  Filled 2022-02-19 (×3): qty 100

## 2022-02-19 MED ORDER — SODIUM CHLORIDE 0.9% FLUSH
10.0000 mL | Freq: Two times a day (BID) | INTRAVENOUS | Status: DC
  Administered 2022-02-19 – 2022-02-20 (×2): 10 mL

## 2022-02-19 MED ORDER — POTASSIUM CHLORIDE 20 MEQ PO PACK
20.0000 meq | PACK | Freq: Once | ORAL | Status: AC
  Administered 2022-02-19: 20 meq via ORAL
  Filled 2022-02-19: qty 1

## 2022-02-19 NOTE — Progress Notes (Signed)
Patient ID: Maria Riley, female   DOB: 24-Jan-1991, 31 y.o.   MRN: [id number] ?[facility] ?Progress Note ? ?   ?Subjective: ?CC-  ?Up in chair. Sore after drain placement but overall doing well. Some nausea with K replacement otherwise no n/v and tolerating clear liquids. Ostomy productive.  ?WBC normalized, afebrile. ? ?Objective: ?Vital signs in last 24 hours: ?Temp:  [98.3 ?F (36.8 ?C)-98.7 ?F (37.1 ?C)] 98.7 ?F (37.1 ?C) (03/16 0802) ?Pulse Rate:  [71-81] 80 (03/16 0445) ?Resp:  [12-20] 15 (03/16 0445) ?BP: (80-87)/(50-59) 87/59 (03/16 0445) ?SpO2:  [96 %-99 %] 98 % (03/16 0445) ?Weight:  [56.7 kg] 56.7 kg (03/15 1300) ?Last BM Date : 02/18/22 ? ?Intake/Output from previous day: ?03/15 0701 - 03/16 0700 ?In: 1345.5 [P.O.:360; I.V.:652.9; IV Piggyback:332.6] ?Out: 425 [Stool:425] ?Intake/Output this shift: ?No intake/output data recorded. ? ?PE: ?Gen:  Alert, NAD, pleasant ?Pulm:  rate and effort normal ?Abd: soft, ND, +BS, ileostomy with scant stool and air in bag. IR drain with purulent fluid in bulb. Mild tenderness over drain otherwise abdomen nontender ? ?Lab Results:  ?Recent Labs  ?  02/17/22 ?1315 02/19/22 ?0206  ?WBC 10.6* 8.4  ?HGB 12.4 9.5*  ?HCT 36.8 29.1*  ?PLT 422* 309  ? ?BMET ?Recent Labs  ?  02/17/22 ?1315 02/19/22 ?0206  ?NA 132* 137  ?K 4.3 3.3*  ?CL 102 108  ?CO2 19* 16*  ?GLUCOSE 86 70  ?BUN 10 <5*  ?CREATININE 1.07* 0.74  ?CALCIUM 9.2 7.9*  ? ?PT/INR ?No results for input(s): LABPROT, INR in the last 72 hours. ?CMP  ?   ?Component Value Date/Time  ? NA 137 02/19/2022 0206  ? K 3.3 (L) 02/19/2022 0206  ? CL 108 02/19/2022 0206  ? CO2 16 (L) 02/19/2022 0206  ? GLUCOSE 70 02/19/2022 0206  ? BUN <5 (L) 02/19/2022 0206  ? CREATININE 0.74 02/19/2022 0206  ? CALCIUM 7.9 (L) 02/19/2022 0206  ? PROT 8.5 (H) 02/17/2022 1315  ? ALBUMIN 3.6 02/17/2022 1315  ? AST 38 02/17/2022 1315  ? ALT 37 02/17/2022 1315  ? ALKPHOS 141 (H) 02/17/2022 1315  ? BILITOT 0.6 02/17/2022 1315  ? GFRNONAA >60  02/19/2022 0206  ? GFRAA >60 07/08/2018 1640  ? ?Lipase  ?   ?Component Value Date/Time  ? LIPASE 38 02/17/2022 1315  ? ? ? ? ? ?Studies/Results: ?CT ABDOMEN PELVIS W CONTRAST ? ?Result Date: 02/17/2022 ?CLINICAL DATA:  Nausea and vomiting.  Pain with urination. EXAM: CT ABDOMEN AND PELVIS WITH CONTRAST TECHNIQUE: Multidetector CT imaging of the abdomen and pelvis was performed using the standard protocol following bolus administration of intravenous contrast. RADIATION DOSE REDUCTION: This exam was performed according to the departmental dose-optimization program which includes automated exposure control, adjustment of the mA and/or kV according to patient size and/or use of iterative reconstruction technique. CONTRAST:  75mL OMNIPAQUE IOHEXOL 350 MG/ML SOLN COMPARISON:  02/04/2022. FINDINGS: Lower chest: No acute abnormality. Hepatobiliary: There is focal fatty infiltration of the liver adjacent to the falciform ligament. No biliary ductal dilatation. The gallbladder is without stones. Pancreas: Unremarkable. No pancreatic ductal dilatation or surrounding inflammatory changes. Spleen: The spleen is stable in size and morphology with a lobular contour. Adrenals/Urinary Tract: No adrenal nodule or mass. No renal calculus or hydronephrosis. The kidneys enhance symmetrically. Diffuse bladder wall thickening is noted. There is persistent mass effect on the bladder with rightward shift. Stomach/Bowel: Right hemicolectomy changes are noted with a right lower quadrant ostomy. No bowel obstruction. Mild diffuse  gastric wall thickening is noted, possible gastritis. Vascular/Lymphatic: No significant vascular findings are present. A few prominent lymph nodes are noted in the pelvis which are likely reactive. Reproductive: Presumed hysterectomy changes are noted in the pelvis. The adnexal regions are not well delineated to a fat stranding. Other: There is a rim enhancing collection in the pelvis on the left with air-fluid  levels and extension into the subcutaneous fat in the anterior abdominal wall. The collection measures 6.5 x 3.3 x 4.7 cm, increased in size from the prior exam. There is significantly increased fat stranding in the pelvis with loculated foci of air in the peritoneum external to the abscess wall and concerning for local extension of abscess into the peritoneum. Musculoskeletal: No acute osseous abnormality. IMPRESSION: 1. Rim enhancing collection in the pelvis on the left with air-fluid levels measuring 6.5 x 3.3 x 4.7 cm, increased in size from the prior exam with significantly increased surrounding inflammatory changes. There is extension of the collection with loculated foci of air into the subcutaneous fat in the anterior abdominal wall and also loculated foci of air external to the abscess wall in the peritoneum concerning for intraperitoneal extension and/or new local abscess formation. 2. Mass effect on the urinary bladder with diffuse bladder wall thickening, possible infectious or inflammatory cystitis related to local inflammatory changes. 3. Mild gastric wall thickening, possible gastritis. Electronically Signed   By: Thornell Sartorius M.D.   On: 02/17/2022 20:25  ? ?IR IMAGE GUIDED DRAINAGE BY PERCUTANEOUS CATHETER ? ?Result Date: 02/18/2022 ?INDICATION: Recurrent LEFT lower quadrant abscess EXAM: FLUOROSCOPIC GUIDED PLACEMENT OF PELVIC DRAINAGE CATHETER COMPARISON:  CT AP, 02/17/2022 and 02/12/2022. MEDICATIONS: The patient was on scheduled IV antibiotics. CONTRAST:  10 mL Omnipaque 300 via drainage catheter. ANESTHESIA/SEDATION: Local anesthetic was administered. FLUOROSCOPY TIME:  0 minutes 36 seconds (1.4 mGy) COMPLICATIONS: None immediate. TECHNIQUE: Informed written consent was obtained from the the patient and/or patient's representative after a discussion of the risks, benefits and alternatives to treatment. Questions regarding the procedure were encouraged and answered. A timeout was performed prior  to the initiation of the procedure. Physical examination of the LEFT lower abdominal quadrant demonstrates a draining sinus tract with purulent drainage. The LEFT lower abdomen was prepped and draped usual sterile fashion a sterile drape was applied, covering the operative table. Maximum barrier sterile technique with sterile gowns and gloves were used for the procedure. A timeout was performed prior to the initiation of the procedure. Local anesthesia was provided with 1% lidocaine with epinephrine. Under intermittent fluoroscopic guidance, a 5 Fr Kumpe catheter was slowly advanced through the sinus tract and into the pelvic collection. Appropriate positioning was confirmed with the efflux of purulent drainage. Contrast was injected through the Kumpe catheter confirming adequate positioning. An Amplatz super stiff wire was advanced through the catheter into the pelvic abscess under intermittent fluoroscopic guidance. The access site was dilated over the Amplatz wire, allowing placement of the 14 Fr drainage catheter. Several postprocedural oblique fluoroscopic images were obtained. The catheter was secured with an 0-Silk suture. Dressings were placed. The patient tolerated procedure well without immediate postprocedural complication. FINDINGS: After fluoroscopic guided drainage catheter placement, the LEFT lower quadrant abdominal approach drainage catheter terminates within the pelvic collection Following pelvic drainage catheter placement, approximately 15 mL of purulent fluid was aspirated and submitted to microbiology for culture. IMPRESSION: Successful fluoroscopic guided placement of a 14 Fr drain for a LEFT lower quadrant pelvic abscess, as above. Roanna Banning, MD [facility]  [facility] Electronically Signed   By: Roanna Banning M.D.   On: 02/18/2022 18:38   ? ?Anti-infectives: ?Anti-infectives (From admission, onward)  ? ? Start     Dose/Rate Route Frequency  Ordered Stop  ? 02/19/22 1000  vancomycin (VANCOREADY) IVPB 750 mg/150 mL       ? 750 mg ?150 mL/hr over 60 Minutes Intravenous Every 12 hours 02/19/22 0813    ? 02/18/22 2200  vancomycin (VANCOCIN) IVPB 1000

## 2022-02-19 NOTE — Assessment & Plan Note (Addendum)
Mild we will replenish with IV and oral potassium supplement today.  Check levels in a.m. ?

## 2022-02-19 NOTE — Progress Notes (Signed)
ANTICOAGULATION CONSULT NOTE - Follow Up Consult ? ?Pharmacy Consult for Heparin (Apixaban on hold) ?Indication: hx bilateral DVT in 01/22/22 ? ?Allergies  ?Allergen Reactions  ? Tape   ?  Adhesive tape- rash  ? ? ?Patient Measurements: ?Height: 4\' 11"  (149.9 cm) ?Weight: 56.7 kg (125 lb) ?IBW/kg (Calculated) : 43.2 ?Heparin Dosing Weight: 56.7 kg ? ?Vital Signs: ?Temp: 98.6 ?F (37 ?C) (03/16 0445) ?Temp Source: Oral (03/16 0445) ?BP: 87/59 (03/16 0445) ?Pulse Rate: 80 (03/16 0445) ? ?Labs: ?Recent Labs  ?  02/17/22 ?1315 02/18/22 ?1018 02/18/22 ?2002 02/19/22 ?0206  ?HGB 12.4  --   --  9.5*  ?HCT 36.8  --   --  29.1*  ?PLT 422*  --   --  309  ?APTT  --  119* 98* 65*  ?HEPARINUNFRC  --  >1.10*  --  0.44  ?CREATININE 1.07*  --   --  0.74  ? ? ? ?Estimated Creatinine Clearance: 79.6 mL/min (by C-G formula based on SCr of 0.74 mg/dL). ? ?Assessment: ?31 y/o F with pelvic abscess. 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 Holding Apixaban and started IV heparin in case procedures are needed. Using aPTTs for monitoring while heparin levels are falsely elevated due to recent Apixaban doses.  Last Apixaban dose 3/13 at 10am. ? ?aPTT is 65 seconds (slightly subtherapeutic) on heparin at 850 units/hr. Heparin levels still discordant with HL at 0.44 ? ? ?Goal of Therapy:  ?Heparin level 0.3-0.7 units/ml ?aPTT 66-102 seconds ?Monitor platelets by anticoagulation protocol: Yes ?  ?Plan:  ?Increase heparin drip to 900 units/hr  ?Daily aPTT and heparin level until correlating; daily CBC. ?F/u restart Apixaban  ? ?Tarius Stangelo A. Levada Dy, PharmD, BCPS, FNKF ?Clinical Pharmacist ?[place] ?Please utilize Amion for appropriate phone number to reach the unit pharmacist ([place]) ? ? ?

## 2022-02-19 NOTE — Plan of Care (Signed)

## 2022-02-19 NOTE — Progress Notes (Signed)
Mobility Specialist: Progress Note ? ? 02/19/22 1659  ?Mobility  ?Activity Ambulated independently in hallway  ?Level of Assistance Independent  ?Assistive Device  ?(IV pole)  ?Distance Ambulated (ft) 650 ft  ?Activity Response Tolerated well  ?$Mobility charge 1 Mobility  ? ?Pt received in bed and agreeable to mobility. C/o abdominal from drain placement pain she rated 3/10 during mobility otherwise asymptomatic. Pt sitting EOB after session with all needs met.  ? ?Maria Riley ?Mobility Specialist ?Mobility Specialist [place]: [phone] ?Mobility Specialist [place]: [phone] ? ?

## 2022-02-19 NOTE — Progress Notes (Signed)
Pharmacy Antibiotic Note ? ?Maria Riley is a 31 y.o. female admitted on 02/17/2022 with  intra-abdominal abscess .  Pharmacy has been consulted for vancomycin and Zosyn dosing. Patient had emergency cesarean section with multiple complications including colectomy and ileostomy. Patient was hospitalized last month for pelvic abscess. Patient is presenting to the ED again with nausea and vomiting. The abscess is confirmed on CT 3/14.  ? ?WBC 10.6>8.4, Temp 100F>>afebrile, HR 121 ? ?Scr improved from 1.07 to 0.74, will adjust dose of vancomycin to maintain AUC 400-600.  ? ?Plan: ?Change Vancomycin to 750mg  q12h for  [facility] 541   ?Continue Zosyn 3.375 mg every 8 hours ?F/u clinical signs and symptoms and deescalate as indicated ?Monitor for worsening AKI with the addition of vancomycin and Zosyn ?Levels as indicated ? ? ?Temp (24hrs), Avg:98.5 ?F (36.9 ?C), Min:98.2 ?F (36.8 ?C), Max:98.7 ?F (37.1 ?C) ? ?Recent Labs  ?Lab 02/17/22 ?1315 02/17/22 ?2046 02/18/22 ?1018 02/19/22 ?0206  ?WBC 10.6*  --   --  8.4  ?CREATININE 1.07*  --   --  0.74  ?LATICACIDVEN  --  1.2 0.6  --   ? ?  ?Estimated Creatinine Clearance: 79.6 mL/min (by C-G formula based on SCr of 0.74 mg/dL).   ? ?Allergies  ?Allergen Reactions  ? Tape   ?  Adhesive tape- rash  ? ? ?Antimicrobials this admission: ?Vancomycin 3/14 >>  ?Zosyn 3/14 >>  ? ?Dose adjustments this admission: ?none ? ?Microbiology results: ?3/14 BCx: IP ?3/15: Abscess: GPC pairs/clusters, GNR ?3/14 UCx: IP  ? ?Xadrian Craighead A. 4/14, PharmD, BCPS, FNKF ?Clinical Pharmacist ?[place] ?Please utilize Amion for appropriate phone number to reach the unit pharmacist [facility]) ? ? ? ?

## 2022-02-19 NOTE — Plan of Care (Signed)
  Problem: Clinical Measurements: Goal: Respiratory complications will improve Outcome: Progressing Goal: Cardiovascular complication will be avoided Outcome: Progressing   

## 2022-02-19 NOTE — Progress Notes (Signed)
Pt had an episode of vomiting after taking potassium packet. Pt probably didn't receive any of the med.  ?

## 2022-02-19 NOTE — Progress Notes (Signed)
?PROGRESS NOTE ? ? ? ?[name]  [id number] DOB: 11-27-1991 DOA: 02/17/2022 ?PCP: Kerin Perna, NP  ? ? ?Brief Narrative:  ?Maria Riley is a 31 y.o. female with past medical history of lower extremity DVT and pelvic abscess which was treated with IR guided drainage on 01/22/2022 with history of emergent C-section and subsequent multiple abdominal surgeries requiring right hemicolectomy and end ileostomy.  Patient presented to hospital with nausea, vomiting with dysuria, urinary frequency and urgency.  In the ED, patient was noted to be hypotensive and tachycardic. CBC showed WBC at 18.6.  Urinalysis showed protein.  EKG showed normal sinus rhythm with tachycardia.  Abdominal and pelvic CT scan showed  rim enhancing collection in the pelvis on the left with air-fluid levels measuring 6.5 x 3.3 x 4.7 cm, increased in size from the prior exam with significantly increased surrounding inflammatory changes with marked Mass effect on the urinary bladder with diffuse bladder wall thickening, possible infectious or inflammatory cystitis related to local inflammatory changes.  General surgery was consulted and the patient was admitted hospital for evaluation and treatment.  ?  ?Assessment and Plan: ?* Pelvic abscess in female ?Large pelvic abscess with suspected intraperitoneal extension and mass effect on the urinary bladder.  Continue IV vancomycin and Zosyn.  Surgery and IR on board.  Patient underwent Fluoroscopy guided drain catheter placement in the left lower quadrant pelvic abscess on 02/18/2022.  Culture is still pending.  Patient did have a recurrent admission for the same so we will make sure that we have culture sensitivity and appropriate antibiotic on discharge.  Afebrile, leukocytosis has improved.  We will continue to monitor.  General surgery recommends continuation of drain on discharge.  Patient will need to follow-up with Dr. Johney Maine IR on 03/10/2022 for management of drain tube. ? ?Sepsis  ([place]) ?Secondary to pelvic abscess.  Continue IV antibiotic.  Status post Drain tube placement.  Follow blood cultures.  Follow abscess culture to discern antibiotic on discharge ? ?Hyponatremia ?Mild on presentation.  Resolved at this time. ? ?History of DVT (deep vein thrombosis) ?Patient was on heparin infusion.  Will change to Eliquis starting today.  No further intervention planned at this time. ? ?Ileostomy in place [facility]) ?History of right hemicolectomy and end ileostomy from previous multiple abdominal surgeries and emergent C-section in the past.  Functioning. ? ?Hypokalemia ?Mild we will replenish with IV and oral potassium supplement today.  Check levels in a.m. ? ? ? DVT prophylaxis:   Heparin drip.  Will change to Eliquis starting today.. ? ? ?Code Status:   ?  Code Status: Full Code ? ?Disposition: Likely home in 1 to 2 days pending culture and sensitivity. ? ?Status is: Inpatient ? ?Remains inpatient appropriate because: Pelvic abscess needing intervention, IV antibiotic, follow cultures, ? ? Family Communication:  ?Spoke with the patient at bedside ? ?Consultants:  ?General surgery ?IR ? ?Procedures:  ?Fluoroscopy guided drain catheter placement in the left lower quadrant pelvic abscess on 02/18/2022. ? ?Antimicrobials:  ?Vancomycin and Zosyn IV ? ?Anti-infectives (From admission, onward)  ? ? Start     Dose/Rate Route Frequency Ordered Stop  ? 02/19/22 1000  vancomycin (VANCOREADY) IVPB 750 mg/150 mL       ? 750 mg ?150 mL/hr over 60 Minutes Intravenous Every 12 hours 02/19/22 0813    ? 02/18/22 2200  vancomycin (VANCOCIN) IVPB 1000 mg/200 mL premix  Status:  Discontinued       ? 1,000 mg ?200 mL/hr over  60 Minutes Intravenous Every 24 hours 02/17/22 2206 02/19/22 0813  ? 02/18/22 0600  piperacillin-tazobactam (ZOSYN) IVPB 3.375 g       ? 3.375 g ?12.5 mL/hr over 240 Minutes Intravenous Every 8 hours 02/17/22 2206    ? 02/17/22 2115  vancomycin (VANCOCIN) IVPB 1000 mg/200 mL premix  Status:   Discontinued       ? 1,000 mg ?200 mL/hr over 60 Minutes Intravenous  Once 02/17/22 2113 02/17/22 2116  ? 02/17/22 2115  piperacillin-tazobactam (ZOSYN) IVPB 3.375 g  Status:  Discontinued       ? 3.375 g ?100 mL/hr over 30 Minutes Intravenous  Once 02/17/22 2113 02/17/22 2116  ? 02/17/22 2100  piperacillin-tazobactam (ZOSYN) IVPB 3.375 g       ? 3.375 g ?100 mL/hr over 30 Minutes Intravenous  Once 02/17/22 2056 02/17/22 2302  ? 02/17/22 2100  vancomycin (VANCOCIN) IVPB 1000 mg/200 mL premix       ? 1,000 mg ?200 mL/hr over 60 Minutes Intravenous  Once 02/17/22 2056 02/18/22 0009  ? ?  ? ? ?Subjective: ?Today, patient was seen and examined at bedside.  I spoke with the help of Spanish interpreter.  Patient denies overt pain, nausea, vomiting, fever, chills or rigor.  Tolerating clears. ? ?Objective: ?Vitals:  ? 02/18/22 2308 02/18/22 2351 02/19/22 0445 02/19/22 0802  ?BP: (!) 80/52 (!) 84/58 (!) 87/59   ?Pulse: 81 77 80   ?Resp: 16 17 15    ?Temp:  98.7 ?F (37.1 ?C) 98.6 ?F (37 ?C) 98.7 ?F (37.1 ?C)  ?TempSrc:  Oral Oral   ?SpO2: 99% 98% 98%   ?Weight:      ?Height:      ? ? ?Intake/Output Summary (Last 24 hours) at 02/19/2022 1153 ?Last data filed at 02/19/2022 0559 ?Gross per 24 hour  ?Intake 1345.48 ml  ?Output 425 ml  ?Net 920.48 ml  ? ?Filed Weights  ? 02/18/22 1300  ?Weight: 56.7 kg  ? ? ?Physical Examination: ?Body mass index is 25.25 kg/m?.  ? ?General:  Average built, not in obvious distress ?HENT:   No scleral pallor or icterus noted. Oral mucosa is moist.  ?Chest:  Clear breath sounds.  Diminished breath sounds bilaterally. No crackles or wheezes.  ?CVS: S1 &S2 heard. No murmur.  Regular rate and rhythm. ?Abdomen: Abdominal scar, drain tube in place, ileostomy in place,  ?Extremities -no cyanosis, clubbing or edema.  Peripheral pulses are palpable. ?Psych: Alert, awake and oriented, normal mood ?CNS:  No cranial nerve deficits.  Power equal in all extremities.   ?Skin: Warm and dry.  No rashes noted. ?   ? ?Data Reviewed:  ? ?CBC: ?Recent Labs  ?Lab 02/17/22 ?1315 02/19/22 ?0206  ?WBC 10.6* 8.4  ?HGB 12.4 9.5*  ?HCT 36.8 29.1*  ?MCV 86.8 89.0  ?PLT 422* 309  ? ? ?Basic Metabolic Panel: ?Recent Labs  ?Lab 02/17/22 ?1315 02/19/22 ?0206  ?NA 132* 137  ?K 4.3 3.3*  ?CL 102 108  ?CO2 19* 16*  ?GLUCOSE 86 70  ?BUN 10 <5*  ?CREATININE 1.07* 0.74  ?CALCIUM 9.2 7.9*  ?MG  --  1.7  ?PHOS  --  3.0  ? ? ?Liver Function Tests: ?Recent Labs  ?Lab 02/17/22 ?1315  ?AST 38  ?ALT 37  ?ALKPHOS 141*  ?BILITOT 0.6  ?PROT 8.5*  ?ALBUMIN 3.6  ? ? ? ?Radiology Studies: ?CT ABDOMEN PELVIS W CONTRAST ? ?Result Date: 02/17/2022 ?CLINICAL DATA:  Nausea and vomiting.  Pain with urination. EXAM: CT ABDOMEN  AND PELVIS WITH CONTRAST TECHNIQUE: Multidetector CT imaging of the abdomen and pelvis was performed using the standard protocol following bolus administration of intravenous contrast. RADIATION DOSE REDUCTION: This exam was performed according to the departmental dose-optimization program which includes automated exposure control, adjustment of the mA and/or kV according to patient size and/or use of iterative reconstruction technique. CONTRAST:  63mL OMNIPAQUE IOHEXOL 350 MG/ML SOLN COMPARISON:  02/04/2022. FINDINGS: Lower chest: No acute abnormality. Hepatobiliary: There is focal fatty infiltration of the liver adjacent to the falciform ligament. No biliary ductal dilatation. The gallbladder is without stones. Pancreas: Unremarkable. No pancreatic ductal dilatation or surrounding inflammatory changes. Spleen: The spleen is stable in size and morphology with a lobular contour. Adrenals/Urinary Tract: No adrenal nodule or mass. No renal calculus or hydronephrosis. The kidneys enhance symmetrically. Diffuse bladder wall thickening is noted. There is persistent mass effect on the bladder with rightward shift. Stomach/Bowel: Right hemicolectomy changes are noted with a right lower quadrant ostomy. No bowel obstruction. Mild diffuse gastric wall  thickening is noted, possible gastritis. Vascular/Lymphatic: No significant vascular findings are present. A few prominent lymph nodes are noted in the pelvis which are likely reactive. Reproductive: Presumed hysterec

## 2022-02-20 DIAGNOSIS — K651 Peritoneal abscess: Secondary | ICD-10-CM | POA: Diagnosis not present

## 2022-02-20 DIAGNOSIS — N739 Female pelvic inflammatory disease, unspecified: Secondary | ICD-10-CM | POA: Diagnosis not present

## 2022-02-20 LAB — BASIC METABOLIC PANEL
Anion gap: 9 (ref 5–15)
Anion gap: 9 (ref 5–15)
BUN: 5 mg/dL — ABNORMAL LOW (ref 6–20)
BUN: 6 mg/dL (ref 6–20)
CO2: 15 mmol/L — ABNORMAL LOW (ref 22–32)
CO2: 13 mmol/L — ABNORMAL LOW (ref 22–32)
Calcium: 7.9 mg/dL — ABNORMAL LOW (ref 8.9–10.3)
Calcium: 8 mg/dL — ABNORMAL LOW (ref 8.9–10.3)
Chloride: 117 mmol/L — ABNORMAL HIGH (ref 98–111)
Chloride: 117 mmol/L — ABNORMAL HIGH (ref 98–111)
Creatinine, Ser: 1.54 mg/dL — ABNORMAL HIGH (ref 0.44–1.00)
Creatinine, Ser: 1.52 mg/dL — ABNORMAL HIGH (ref 0.44–1.00)
GFR, Estimated: 47 mL/min — ABNORMAL LOW (ref >60)
GFR, Estimated: 47 mL/min — ABNORMAL LOW (ref >60)
Glucose, Bld: 86 mg/dL (ref 70–99)
Glucose, Bld: 91 mg/dL (ref 70–99)
Potassium: 3.6 mmol/L (ref 3.5–5.1)
Potassium: 3.9 mmol/L (ref 3.5–5.1)
Sodium: 141 mmol/L (ref 135–145)
Sodium: 139 mmol/L (ref 135–145)

## 2022-02-20 LAB — CBC
HCT: 32.2 % — ABNORMAL LOW (ref 36.0–46.0)
Hemoglobin: 10.5 g/dL — ABNORMAL LOW (ref 12.0–15.0)
MCH: 29.7 pg (ref 26.0–34.0)
MCHC: 32.6 g/dL (ref 30.0–36.0)
MCV: 91.2 fL (ref 80.0–100.0)
Platelets: 323 10*3/uL (ref 150–400)
RBC: 3.53 MIL/uL — ABNORMAL LOW (ref 3.87–5.11)
RDW: 14.6 % (ref 11.5–15.5)
WBC: 9.6 10*3/uL (ref 4.0–10.5)
nRBC: 0 % (ref 0.0–0.2)

## 2022-02-20 LAB — MAGNESIUM: Magnesium: 1.8 mg/dL (ref 1.7–2.4)

## 2022-02-20 LAB — APTT: aPTT: 47 seconds — ABNORMAL HIGH (ref 24–36)

## 2022-02-20 MED ORDER — ACETAMINOPHEN 325 MG PO TABS: 650.0000 mg | ORAL_TABLET | Freq: Four times a day (QID) | ORAL | Status: AC | PRN

## 2022-02-20 MED ORDER — AMOXICILLIN-POT CLAVULANATE 500-125 MG PO TABS: 1.0000 | ORAL_TABLET | Freq: Two times a day (BID) | ORAL | 0 refills | Status: AC

## 2022-02-20 MED ORDER — ONDANSETRON HCL 4 MG PO TABS
4.0000 mg | ORAL_TABLET | Freq: Four times a day (QID) | ORAL | 0 refills | Status: AC | PRN
Start: 2022-02-20 — End: 2022-02-27

## 2022-02-20 MED ORDER — PANTOPRAZOLE SODIUM 40 MG PO TBEC: 40.0000 mg | DELAYED_RELEASE_TABLET | Freq: Every day | ORAL | 0 refills | Status: DC

## 2022-02-20 NOTE — Discharge Summary (Signed)
Physician Discharge Summary  ?Maria Riley [id number] DOB: 01-03-1991 DOA: 02/17/2022 ? ?PCP: Kerin Perna, NP ? ?Admit date: 02/17/2022 ?Discharge date: 02/20/2022 ? ?Admitted From: Home ?Disposition:  Home ? ?Recommendations for Outpatient Follow-up:  ?Follow up with PCP in 1 week ?Please obtain BMP/CBC in one week ?Take Augmentin 500 mg twice daily for 5 days ?Increase hydration.  Avoid use of NSAIDs ?Drain care as per general surgery recommendations ?Follow-up with general surgery outpatient as a scheduled ?Tylenol as needed for pain control ?Follow-up on pending culture results ? ?Home Health: None ?Equipment/Devices: None ?Discharge Condition: Stable ?CODE STATUS: Full code ?Diet recommendation: Regular diet ? ?Brief/Interim Summary: ?Maria Riley is a 31 y.o. female with past medical history of lower extremity DVT and pelvic abscess which was treated with IR guided drainage on 01/22/2022 with history of emergent C-section and subsequent multiple abdominal surgeries requiring right hemicolectomy and end ileostomy.  Patient presented to hospital with nausea, vomiting with dysuria, urinary frequency and urgency.  In the ED, patient was noted to be hypotensive and tachycardic. CBC showed WBC at 18.6.  Urinalysis showed protein.  EKG showed normal sinus rhythm with tachycardia.  Abdominal and pelvic CT scan showed  rim enhancing collection in the pelvis on the left with air-fluid levels measuring 6.5 x 3.3 x 4.7 cm, increased in size from the prior exam with significantly increased surrounding inflammatory changes with marked Mass effect on the urinary bladder with diffuse bladder wall thickening, possible infectious or inflammatory cystitis related to local inflammatory changes.  General surgery was consulted and the patient was admitted hospital for evaluation and treatment.  ? ?Sepsis secondary to pelvic abscess in female ?-Large pelvic abscess with suspected intraperitoneal extension and mass effect  on the urinary bladder.   ?-Patient started on  IV vancomycin and Zosyn.   ?-Surgery and IR consulted-Patient underwent Fluoroscopy guided drain catheter placement in the left lower quadrant pelvic abscess on 02/18/2022.   ?- General surgery recommends continuation of drain on discharge.  Patient will need to follow-up with Dr. Johney Maine IR on 03/10/2022 for management of drain tube. ?-Blood culture: No growth till date.  Aerobic/anaerobic culture: Gram-positive cocci in pairs and clusters.  Gram-negative rods.  Susceptibilities to follow ?-Okay to discharge from general surgery standpoint.  Will discharge patient on Augmentin 500 twice daily for 5 days as per general surgery recommendations. ? ?AKI: ?-Likely secondary to Zosyn and decreased p.o. intake ?-Repeat BMP stable creatinine. ?-I encourage patient to drink plenty of water and avoid use of NSAIDs and repeat BMP with PCP in 1 week ? ? ?Hyponatremia ?-Resolved ?  ?History of DVT (deep vein thrombosis) ?-Patient was on heparin infusion.   ?-Continue Eliquis on discharge  ? ?ileostomy in place [facility]) ?-History of right hemicolectomy and end ileostomy from previous multiple abdominal surgeries and emergent C-section in the past.  Functioning. ?  ?Hypokalemia ?Resolved.  Magnesium: WNL ? ?Discharge Diagnoses:  ?Sepsis secondary to pelvic abscess in female ?Hyponatremia ?History of DVT ?Ileostomy in place ?Hypokalemia ?AKI ? ? ?Discharge Instructions ? ?Discharge Instructions   ? ? Diet general   Complete by: As directed ?  ? Discharge wound care:   Complete by: As directed ?  ? As discussed by GS  ? Increase activity slowly   Complete by: As directed ?  ? ?  ? ?Allergies as of 02/20/2022   ? ?   Reactions  ? Tape   ? Adhesive tape- rash  ? ?  ? ?  ?  Medication List  ?  ? ?TAKE these medications   ? ?acetaminophen 325 MG tablet ?Commonly known as: TYLENOL ?Take 2 tablets (650 mg total) by mouth every 6 (six) hours as needed for mild pain (or Fever >/= 101). ?   ?amoxicillin-clavulanate 500-125 MG tablet ?Commonly known as: Augmentin ?Take 1 tablet (500 mg total) by mouth 2 (two) times daily for 5 days. ?  ?apixaban 5 MG Tabs tablet ?Commonly known as: ELIQUIS ?Take 1 tablet (5 mg total) by mouth 2 (two) times daily. ?Start taking on: February 22, 2022 ?What changed: Another medication with the same name was removed. Continue taking this medication, and follow the directions you see here. ?  ?CertaVite/Antioxidants Tabs ?Tome 1 tableta por v?a oral diariamente. ?(Take 1 tablet by mouth daily.) ?  ?ondansetron 4 MG tablet ?Commonly known as: ZOFRAN ?Take 1 tablet (4 mg total) by mouth every 6 (six) hours as needed for up to 7 days for nausea. ?  ?pantoprazole 40 MG tablet ?Commonly known as: PROTONIX ?Take 1 tablet (40 mg total) by mouth daily. ?Start taking on: February 21, 2022 ?  ? ?  ? ?  ?  ? ? ?  ?Discharge Care Instructions  ?(From admission, onward)  ?  ? ? ?  ? ?  Start     Ordered  ? 02/20/22 0000  Discharge wound care:       ?Comments: As discussed by GS  ? 02/20/22 1250  ? ?  ?  ? ?  ? ? Follow-up Information   ? ? Michael Boston, MD. Go on 03/10/2022.   ?Specialties: General Surgery, Colon and Rectal Surgery ?Why: Your appointment is 4/4 at 10:15am ?Please arrive 30 minutes prior to your appointment to check in and fill out paperwork. bring photo ID and insurance information. ?Contact information: ?[address] ?[address] ?[place] [address] ?[phone] ? ? ?  ?  ? ?  ?  ? ?  ? ?Allergies  ?Allergen Reactions  ? Tape   ?  Adhesive tape- rash  ? ? ?Consultations: ?General surgery ?IR ? ? ?Procedures/Studies: ?CT Angio Chest Pulmonary Embolism (PE) W or WO Contrast ? ?Addendum Date: 01/23/2022   ?ADDENDUM REPORT: 01/23/2022 23:42 ADDENDUM: Critical findings were reported to NP Lovey Newcomer at 11:41 p.m. Electronically Signed   By: Brett Fairy M.D.   On: 01/23/2022 23:42  ? ?Result Date: 01/23/2022 ?CLINICAL DATA:  Pulmonary embolism suspected, positive D-dimer.  Known bilateral DVTs. EXAM: CT ANGIOGRAPHY CHEST WITH CONTRAST TECHNIQUE: Multidetector CT imaging of the chest was performed using the standard protocol during bolus administration of intravenous contrast. Multiplanar CT image reconstructions and MIPs were obtained to evaluate the vascular anatomy. RADIATION DOSE REDUCTION: This exam was performed according to the departmental dose-optimization program which includes automated exposure control, adjustment of the mA and/or kV according to patient size and/or use of iterative reconstruction technique. CONTRAST:  69mL OMNIPAQUE IOHEXOL 350 MG/ML SOLN COMPARISON:  01/22/2022. FINDINGS: Cardiovascular: The heart is normal in size and there is no pericardial effusion. The aorta and pulmonary trunk are normal in caliber. Filling defects are noted in the left pulmonary artery extending into the left upper and lower lobe lobar and segmental arteries. Segmental and subsegmental pulmonary artery filling defects are present in the right lower lobe. No evidence of right heart strain. Mediastinum/Nodes: No enlarged mediastinal, hilar, or axillary lymph nodes. Thyroid gland, trachea, and esophagus demonstrate no significant findings. Lungs/Pleura: Lungs are clear. No pleural effusion or pneumothorax. Upper Abdomen:  No acute abnormality. Musculoskeletal: No chest wall abnormality. No acute or significant osseous findings. Review of the MIP images confirms the above findings. IMPRESSION: Bilateral pulmonary emboli, moderate thrombus burden. No evidence of right heart strain. Electronically Signed: By: Brett Fairy M.D. On: 01/23/2022 23:37  ? ?CT PELVIS W CONTRAST ? ?Result Date: 02/12/2022 ?CLINICAL DATA:  History of placental abruption requiring emergent cesarean section in [place] with multiple subsequent complications resulting in right hemicolectomy, right lower quadrant ostomy creation and presumed partial hysterectomy. CT scan of the abdomen pelvis performed 01/22/2022  demonstrated an indeterminate fluid collection within the left lower pelvis for which the patient underwent percutaneous drainage catheter placement on 01/22/2022. The patient was evaluated in the interventional radiology dr

## 2022-02-20 NOTE — Progress Notes (Signed)
Patient ID: Maria Riley, female   DOB: [birth year], 31 y.o.   MRN: [id number] ?[facility] ?Progress Note ? ?   ?Subjective: ?Laying in bed comfortably. Eating and drinking well. Ileostomy with good output. Some pain at drain site but otherwise no complaints ? ?Objective: ?Vital signs in last 24 hours: ?Temp:  [97.8 ?F (36.6 ?C)-99.5 ?F (37.5 ?C)] 98.5 ?F (36.9 ?C) (03/17 0725) ?Pulse Rate:  [58-85] 73 (03/17 0725) ?Resp:  [12-23] 16 (03/17 0339) ?BP: (86-100)/(46-75) 90/62 (03/17 0725) ?SpO2:  [97 %-100 %] 100 % (03/17 0725) ?Last BM Date : 02/19/22 ? ?Intake/Output from previous day: ?03/16 0701 - 03/17 0700 ?In: 1975 [I.V.:1598.3; IV Piggyback:366.6] ?Out: 630 [Drains:30; [id number] ?Intake/Output this shift: ?No intake/output data recorded. ? ?PE: ?Gen:  Alert, NAD, pleasant ?Pulm:  rate and effort normal ?Abd: soft, ND, +BS, ileostomy with stool and air in bag. IR drain with scant bloody purulent fluid in bulb. Mild tenderness over drain otherwise abdomen nontender ? ?Lab Results:  ?Recent Labs  ?  02/19/22 ?0206 02/20/22 ?[id number]  ?WBC 8.4 9.6  ?HGB 9.5* 10.5*  ?HCT 29.1* 32.2*  ?PLT 309 323  ? ? ?BMET ?Recent Labs  ?  02/19/22 ?0206 02/20/22 ?[id number]  ?NA 137 141  ?K 3.3* 3.6  ?CL 108 117*  ?CO2 16* 15*  ?GLUCOSE 70 86  ?BUN <5* 5*  ?CREATININE 0.74 1.54*  ?CALCIUM 7.9* 7.9*  ? ? ?PT/INR ?No results for input(s): LABPROT, INR in the last 72 hours. ?CMP  ?   ?Component Value Date/Time  ? NA 141 02/20/2022 0235  ? K 3.6 02/20/2022 0235  ? CL 117 (H) 02/20/2022 0235  ? CO2 15 (L) 02/20/2022 0235  ? GLUCOSE 86 02/20/2022 0235  ? BUN 5 (L) 02/20/2022 0235  ? CREATININE 1.54 (H) 02/20/2022 0235  ? CALCIUM 7.9 (L) 02/20/2022 0235  ? PROT 8.5 (H) 02/17/2022 1315  ? ALBUMIN 3.6 02/17/2022 1315  ? AST 38 02/17/2022 1315  ? ALT 37 02/17/2022 1315  ? ALKPHOS 141 (H) 02/17/2022 1315  ? BILITOT 0.6 02/17/2022 1315  ? GFRNONAA 47 (L) 02/20/2022 0235  ? GFRAA >60 07/08/2018 1640  ? ?Lipase  ?   ?Component Value  Date/Time  ? LIPASE 38 02/17/2022 1315  ? ? ? ? ? ?Studies/Results: ?IR IMAGE GUIDED DRAINAGE BY PERCUTANEOUS CATHETER ? ?Result Date: 02/18/2022 ?INDICATION: Recurrent LEFT lower quadrant abscess EXAM: FLUOROSCOPIC GUIDED PLACEMENT OF PELVIC DRAINAGE CATHETER COMPARISON:  CT AP, 02/17/2022 and 02/12/2022. MEDICATIONS: The patient was on scheduled IV antibiotics. CONTRAST:  10 mL Omnipaque 300 via drainage catheter. ANESTHESIA/SEDATION: Local anesthetic was administered. FLUOROSCOPY TIME:  0 minutes 36 seconds (1.4 mGy) COMPLICATIONS: None immediate. TECHNIQUE: Informed written consent was obtained from the the patient and/or patient's representative after a discussion of the risks, benefits and alternatives to treatment. Questions regarding the procedure were encouraged and answered. A timeout was performed prior to the initiation of the procedure. Physical examination of the LEFT lower abdominal quadrant demonstrates a draining sinus tract with purulent drainage. The LEFT lower abdomen was prepped and draped usual sterile fashion a sterile drape was applied, covering the operative table. Maximum barrier sterile technique with sterile gowns and gloves were used for the procedure. A timeout was performed prior to the initiation of the procedure. Local anesthesia was provided with 1% lidocaine with epinephrine. Under intermittent fluoroscopic guidance, a 5 Fr Kumpe catheter was slowly advanced through the sinus tract and into the pelvic collection. Appropriate positioning was confirmed with the efflux  of purulent drainage. Contrast was injected through the Kumpe catheter confirming adequate positioning. An Amplatz super stiff wire was advanced through the catheter into the pelvic abscess under intermittent fluoroscopic guidance. The access site was dilated over the Amplatz wire, allowing placement of the 14 Fr drainage catheter. Several postprocedural oblique fluoroscopic images were obtained. The catheter was secured  with an 0-Silk suture. Dressings were placed. The patient tolerated procedure well without immediate postprocedural complication. FINDINGS: After fluoroscopic guided drainage catheter placement, the LEFT lower quadrant abdominal approach drainage catheter terminates within the pelvic collection Following pelvic drainage catheter placement, approximately 15 mL of purulent fluid was aspirated and submitted to microbiology for culture. IMPRESSION: Successful fluoroscopic guided placement of a 14 Fr drain for a LEFT lower quadrant pelvic abscess, as above. Michaelle Birks, MD [facility] Electronically Signed   By: Michaelle Birks M.D.   On: 02/18/2022 18:38   ? ?Anti-infectives: ?Anti-infectives (From admission, onward)  ? ? Start     Dose/Rate Route Frequency Ordered Stop  ? 02/19/22 1000  vancomycin (VANCOREADY) IVPB 750 mg/150 mL       ? 750 mg ?150 mL/hr over 60 Minutes Intravenous Every 12 hours 02/19/22 0813    ? 02/18/22 2200  vancomycin (VANCOCIN) IVPB 1000 mg/200 mL premix  Status:  Discontinued       ? 1,000 mg ?200 mL/hr over 60 Minutes Intravenous Every 24 hours 02/17/22 2206 02/19/22 0813  ? 02/18/22 0600  piperacillin-tazobactam (ZOSYN) IVPB 3.375 g       ? 3.375 g ?12.5 mL/hr over 240 Minutes Intravenous Every 8 hours 02/17/22 2206    ? 02/17/22 2115  vancomycin (VANCOCIN) IVPB 1000 mg/200 mL premix  Status:  Discontinued       ? 1,000 mg ?200 mL/hr over 60 Minutes Intravenous  Once 02/17/22 2113 02/17/22 2116  ? 02/17/22 2115  piperacillin-tazobactam (ZOSYN) IVPB 3.375 g  Status:  Discontinued       ? 3.375 g ?100 mL/hr over 30 Minutes Intravenous  Once 02/17/22 2113 02/17/22 2116  ? 02/17/22 2100  piperacillin-tazobactam (ZOSYN) IVPB 3.375 g       ? 3.375 g ?100 mL/hr over 30 Minutes Intravenous  Once 02/17/22 2056 02/17/22 2302  ? 02/17/22 2100  vancomycin (VANCOCIN) IVPB 1000 mg/200 mL premix       ? 1,000 mg ?200 mL/hr over 60 Minutes Intravenous   Once 02/17/22 2056 02/18/22 0009  ? ?  ? ? ? ?Assessment/Plan ?S/p multiple abdominal surgeries in [place] - C-section, right hemicolectomy and ileostomy 12/2021 ?Pelvic abscess ?- followed by [place] previous admission last month for pelvic abscess. Had IR drain placed 2/17 that has since been removed. IR evaluated 3/2 and 3/9 without findings of fistula  ?- CT 3/14 with persistence of pelvic abscess as well as evidence of gastritis ?- PPI for gastritis ?- s/p IR drain 3/15, gram stain with GPC and GNR, culture with GNR ?- Continue drain and antibiotics, follow culture - based on preliminary findings could transition to PO Augmentin at discharge. Tolerating soft diet. Mobilize. Has gone over drain care with nursing ?  ?FEN: IVF, soft diet ?ID: zosyn/vanc ?VTE: heparin gtt stopped. eliquis ?Follow up: needs IR drain clinical follow up. Has appt 4/4 with Dr. Johney Maine. Surgically stable for discharge ? ?H/o DVT - eliquis resumed ? ?I reviewed Consultant interventional radiology notes, hospitalist notes, last 24 h vitals and pain scores, last 48 h intake and output, and last 24 h labs and  trends ? ? ?Due to language barrier, an interpreter was present during the history-taking and subsequent discussion (and for part of the physical exam) with this patient. ? ? ? LOS: 3 days  ? ? ?Winferd Humphrey, PA-C ?[facility] ?02/20/2022, 7:43 AM ?Please see Amion for pager number during day hours 7:00am-4:30pm ? ?

## 2022-02-20 NOTE — Consult Note (Signed)
? ?  [facility] CM Inpatient Consult ? ? ?02/20/2022 ? ?Mickie Bail Rone ?1990/12/13 ?[id number] ? ?High risk Managed Medicaid:  Healthy Blue ? ?Patient reviewed for less than 30 day readmission and high risk for post hospital transition. ? ?Plan:  Referral request for high risk follow up. ? ?Charlesetta Shanks, RN BSN CCM ?[employer] Liaison ? [phone] business mobile phone ?Toll free office [phone]  ?Fax number: [phone] ?[email or web address] ? ? ?

## 2022-02-22 LAB — CULTURE, BLOOD (ROUTINE X 2)
Culture: NO GROWTH
Special Requests: ADEQUATE

## 2022-02-23 ENCOUNTER — Telehealth: Payer: Self-pay

## 2022-02-23 LAB — AEROBIC/ANAEROBIC CULTURE W GRAM STAIN (SURGICAL/DEEP WOUND)

## 2022-02-23 LAB — CULTURE, BLOOD (ROUTINE X 2)
Culture: NO GROWTH
Special Requests: ADEQUATE

## 2022-02-23 NOTE — Telephone Encounter (Signed)
Transition Care Management Follow-up Telephone Call ? ?Call completed with assistance of Spanish Interpreter # [id number]/[employer] ?Date of discharge and from where: 02/20/2022, [facility]  ?How have you been since you were released from the hospital? She stated she is doing "good."  ?Any questions or concerns? Yes - she inquired about the appointment with IR.  Explained to her per AVS, they will be contacting her to schedule follow up. Offered to provide her with the phone number to call IR and she said she is all set.  ? ?Items Reviewed: ?Did the pt receive and understand the discharge instructions provided? Yes  ?Medications obtained and verified? Yes - she said she has all of her medications and did not have any questions about the med regime  ?Other? No  ?Any new allergies since your discharge? No  ?Dietary orders reviewed? Yes ?Do you have support at home? Yes  ? ?Home Care and Equipment/Supplies: ?Were home health services ordered? no ?If so, what is the name of the agency? N/a  ?Has the agency set up a time to come to the patient's home? not applicable ?Were any new equipment or medical supplies ordered?  No ?What is the name of the medical supply agency? N/a ?Were you able to get the supplies/equipment? not applicable ?Do you have any questions related to the use of the equipment or supplies? No ? ?Functional Questionnaire: (I = Independent and D = Dependent) ?ADLs: independent.  She said she manages the ileostomy independently. She also noted that the pelvic drain is in place but she does not have any drainage from it.  ? ? ?Follow up appointments reviewed: ? ?PCP Hospital f/u appt confirmed? Yes  Scheduled to see Juluis Mire, NP @ [facility]  -  03/09/2022.  ?[facility] f/u appt confirmed? Yes  Scheduled to see ostomy nurse- 02/27/2033; surgeon - 03/10/2022. ?Are transportation arrangements needed? Yes  ?If their condition worsens, is the pt aware to call PCP or go to the Emergency  Dept.? Yes ?Was the patient provided with contact information for the PCP's office or ED? Yes ?Was to pt encouraged to call back with questions or concerns? Yes ? ?

## 2022-02-24 ENCOUNTER — Other Ambulatory Visit: Payer: Self-pay | Admitting: Surgery

## 2022-02-24 DIAGNOSIS — N739 Female pelvic inflammatory disease, unspecified: Secondary | ICD-10-CM

## 2022-02-26 NOTE — Addendum Note (Signed)
Encounter addended by: Arby Barrette on: 02/26/2022 10:12 AM ? Actions taken: Imaging Exam ended

## 2022-02-26 NOTE — Addendum Note (Signed)
Encounter addended by: Arby Barrette on: 02/26/2022 10:08 AM ? Actions taken: Imaging Exam ended

## 2022-02-27 ENCOUNTER — Other Ambulatory Visit: Payer: Self-pay

## 2022-02-27 ENCOUNTER — Ambulatory Visit (HOSPITAL_COMMUNITY)
Admission: RE | Admit: 2022-02-27 | Discharge: 2022-02-27 | Disposition: A | Discharge status: Home / Self Care | Payer: Medicaid Other | Source: Ambulatory Visit | Attending: Nurse Practitioner | Admitting: Nurse Practitioner

## 2022-02-27 DIAGNOSIS — L259 Unspecified contact dermatitis, unspecified cause: Secondary | ICD-10-CM | POA: Insufficient documentation

## 2022-02-27 DIAGNOSIS — Z432 Encounter for attention to ileostomy: Secondary | ICD-10-CM

## 2022-02-27 NOTE — Progress Notes (Signed)
[facility]  ? ?Reason for visit:  ?RLQ ileostomy- we communicate with the assistance of a remote interpreter.  Patient is here with a friend today.  ?HPI:  ?Right hemicolectomy/emergent C section with infection ileostomy ?Recent readmission to hospital with abdominal abscess  ? ?ROS  ?Review of Systems  ?Constitutional:  Positive for fatigue.  ?     Fatigue improving, trying to walk daily.   ?Gastrointestinal:  Positive for abdominal pain.  ?     Recent abdominal abscess with hospitalization ?RLQ ileostomy  ?Psychiatric/Behavioral:  Positive for dysphoric mood.   ?     Depression, recent fetal loss, new ostomy  ?All other systems reviewed and are negative. ?Vital signs:  ?BP 103/74 (BP Location: Right Arm)   Pulse 72   Temp 97.9 ?F (36.6 ?C) (Oral)   Resp 18   LMP 06/08/2021 (Approximate)   SpO2 99%  ?Exam:  ?Physical Exam ?Constitutional:   ?   Appearance: Normal appearance.  ?Abdominal:  ?   Palpations: Abdomen is soft.  ?   Tenderness: There is abdominal tenderness.  ?Skin: ?   General: Skin is warm and dry.  ?   Comments: Contact dermatitis to peristomal skin resolved, some itching present.  Recent antibiotics  will replace stoma powder with antifungal powder for a short period.   ?Neurological:  ?   Mental Status: She is alert.  ?  ?Stoma type/location:  RLQ ileostomy, slightly budded ?Stomal assessment/size:  1" ?Peristomal assessment: intact   skin has improved and is no longer tender to touch.  She is feeling more confident with her care ?Treatment options for stomal/peristomal skin: convex pouch with barrier ring ?Output: liquid brown stool ?Ostomy pouching: 1pc. Convex  ?Education provided:  Will continue powder and skin prep to reinforce learning at this time   ? ?  ?Impression/dx  ?Irritant contact dermatitis, improved ?Discussion  ?Ongoing reinforcement of ostomy care with aid of interpretive services.  ?Plan  ?Will order supplies, ([employer]) needs ongoing pouches, rings, stoma powder  and skin prep  samples sent home with patient today.  ? ? ? ?Visit time: 50 minutes.  ? ?Maple Hudson FNP-BC ? ?  ?

## 2022-02-27 NOTE — Discharge Instructions (Signed)
Switch to anti fungal powder for now until itching and redness stops ?Use blue square (skin prep) to seal powder ?Use barrier ring ?Use barrier strips on outside of pouch for support ?I will call [facility] to update supplies.  ?

## 2022-03-02 ENCOUNTER — Inpatient Hospital Stay: Payer: Medicaid Other | Admitting: Nurse Practitioner

## 2022-03-09 ENCOUNTER — Ambulatory Visit (INDEPENDENT_AMBULATORY_CARE_PROVIDER_SITE_OTHER): Payer: Medicaid Other | Admitting: Primary Care

## 2022-03-09 ENCOUNTER — Ambulatory Visit (HOSPITAL_COMMUNITY): Payer: Medicaid Other

## 2022-03-09 ENCOUNTER — Encounter (INDEPENDENT_AMBULATORY_CARE_PROVIDER_SITE_OTHER): Payer: Self-pay | Admitting: Primary Care

## 2022-03-09 VITALS — BP 111/77 | HR 66 | Temp 97.9°F | Ht 59.0 in | Wt 128.2 lb

## 2022-03-09 DIAGNOSIS — N898 Other specified noninflammatory disorders of vagina: Secondary | ICD-10-CM | POA: Diagnosis not present

## 2022-03-09 DIAGNOSIS — Z09 Encounter for follow-up examination after completed treatment for conditions other than malignant neoplasm: Secondary | ICD-10-CM

## 2022-03-09 NOTE — Progress Notes (Signed)
?[place] ? ? ?Subjective:  ?Maria Riley is a 31 y.o. Hispanic ( interpreter Helene Kelp [phone]) female presents for hospital follow up. She presents to the emergency room with recurrent nausea and vomiting. Admit date to the hospital was 02/17/22, patient was discharged from the hospital on 02/20/22, patient was admitted for: Pelvic abscess in female, Ileostomy in place [facility]), Sepsis ([place]), Hyponatremia, History of DVT (deep vein thrombosis) and Hypokalemia. Today I am doing better. Patient has No headache, No chest pain, No abdominal pain - No Nausea, No new weakness tingling or numbness, No Cough - shortness of breath ? ? ?Past Medical History:  ?Diagnosis Date  ? Medical history non-contributory   ?  ? ?Allergies  ?Allergen Reactions  ? Tape   ?  Adhesive tape- rash  ? ? ?  ?Current Outpatient Medications on File Prior to Visit  ?Medication Sig Dispense Refill  ? acetaminophen (TYLENOL) 325 MG tablet Take 2 tablets (650 mg total) by mouth every 6 (six) hours as needed for mild pain (or Fever >/= 101).    ? apixaban (ELIQUIS) 5 MG TABS tablet Take 1 tablet (5 mg total) by mouth 2 (two) times daily. 180 tablet 0  ? Multiple Vitamins-Minerals (CERTAVITE/ANTIOXIDANTS) TABS Take 1 tablet by mouth daily. 90 tablet 0  ? pantoprazole (PROTONIX) 40 MG tablet Take 1 tablet (40 mg total) by mouth daily. 30 tablet 0  ? [DISCONTINUED] fluticasone (FLONASE) 50 MCG/ACT nasal spray Place 1 spray into both nostrils daily. 16 g 0  ? [DISCONTINUED] LO LOESTRIN FE 1 MG-10 MCG / 10 MCG tablet Take 1 tablet by mouth daily. 1 Package 11  ? [DISCONTINUED] medroxyPROGESTERone (DEPO-PROVERA) 150 MG/ML injection Inject 1 mL (150 mg total) into the muscle every 3 (three) months. (Patient not taking: Reported on 11/14/2018) 1 mL 4  ? ?No current facility-administered medications on file prior to visit.  ? ?Review of System: ?Comprehensive ROS Pertinent positive and negative noted in HPI   ? ?Objective:  ?BP 111/77 (BP  Location: Right Arm, Patient Position: Sitting, Cuff Size: Normal)   Pulse 66   Temp 97.9 ?F (36.6 ?C) (Oral)   Ht 4\' 11"  (1.499 m)   Wt 128 lb 3.2 oz (58.2 kg)   LMP 06/08/2021 (Approximate)   SpO2 96%   BMI 25.89 kg/m?  ? ?Filed Weights  ? 03/09/22 1434  ?Weight: 128 lb 3.2 oz (58.2 kg)  ? ?Physical Exam: ?General Appearance: Well nourished, in no apparent distress. ?Eyes: PERRLA, EOMs, conjunctiva no swelling or erythema ?Sinuses: No Frontal/maxillary tenderness ?ENT/Mouth: Ext aud canals clear, TMs without erythema, bulging. Hearing normal.  ?Neck: Supple, thyroid normal.  ?Respiratory: Respiratory effort normal, BS equal bilaterally without rales, rhonchi, wheezing or stridor.  ?Cardio: RRR with no MRGs. Brisk peripheral pulses without edema.  ?Abdomen: Soft, + BS.   Ostomy bag in place on right quadrant.  Abdominal drain in place on left quadrant- no s/s of infection (also reviewed s/s of infection ) ?Lymphatics: Non tender without lymphadenopathy.  ?Musculoskeletal: Full ROM, 5/5 strength, normal gait.  ?Skin: Warm, dry without rashes, lesions, ecchymosis.  ?Neuro: Cranial nerves intact. Normal muscle tone, no cerebellar symptoms. Sensation intact.  ?Psych: Awake and oriented X 3, normal affect, Insight and Judgment appropriate.  ? ? ?Assessment:  ?Maria Riley was seen today for hospitalization follow-up. ? ?Diagnoses and all orders for this visit: ? ?Hospital discharge follow-up ?Go to Michael Boston, MD (General Surgery) on 03/10/2022; Your appointment is 4/4 at 10:15am  ?Please arrive 30  minutes prior to your appointment to check in and fill out paperwork. bring photo ID and insurance information. ?Follow up with Kerin Perna, NP (Internal Medicine) completed  ?Follow up with Michaelle Birks, MD (Interventional Radiology); Schedulers will contact you with date and time of follow-up ?  ?Vaginal irritation ?Sister has been helping her and she has had changes in soap and detergents  ? ? ? ?This note has  been created with Surveyor, quantity. Any transcriptional errors are unintentional.  ? ?Kerin Perna, NP ?03/09/2022, 2:46 PM ?   ?

## 2022-03-10 DIAGNOSIS — Z789 Other specified health status: Secondary | ICD-10-CM | POA: Diagnosis not present

## 2022-03-10 DIAGNOSIS — Z932 Ileostomy status: Secondary | ICD-10-CM | POA: Diagnosis not present

## 2022-03-10 DIAGNOSIS — I824Z1 Acute embolism and thrombosis of unspecified deep veins of right distal lower extremity: Secondary | ICD-10-CM | POA: Diagnosis not present

## 2022-03-10 DIAGNOSIS — N739 Female pelvic inflammatory disease, unspecified: Secondary | ICD-10-CM | POA: Diagnosis not present

## 2022-03-11 ENCOUNTER — Ambulatory Visit
Admission: RE | Admit: 2022-03-11 | Discharge: 2022-03-11 | Disposition: A | Discharge status: Home / Self Care | Payer: Medicaid Other | Source: Ambulatory Visit | Attending: Student | Admitting: Student

## 2022-03-11 ENCOUNTER — Ambulatory Visit
Admission: RE | Admit: 2022-03-11 | Discharge: 2022-03-11 | Disposition: A | Discharge status: Home / Self Care | Payer: Medicaid Other | Source: Ambulatory Visit | Attending: Surgery | Admitting: Surgery

## 2022-03-11 DIAGNOSIS — Z4682 Encounter for fitting and adjustment of non-vascular catheter: Secondary | ICD-10-CM | POA: Diagnosis not present

## 2022-03-11 DIAGNOSIS — N739 Female pelvic inflammatory disease, unspecified: Secondary | ICD-10-CM

## 2022-03-11 DIAGNOSIS — K651 Peritoneal abscess: Secondary | ICD-10-CM | POA: Diagnosis not present

## 2022-03-11 DIAGNOSIS — K760 Fatty (change of) liver, not elsewhere classified: Secondary | ICD-10-CM | POA: Diagnosis not present

## 2022-03-11 HISTORY — PX: IR RADIOLOGIST EVAL & MGMT: IMG5224

## 2022-03-11 MED ORDER — IOPAMIDOL (ISOVUE-300) INJECTION 61%
100.0000 mL | Freq: Once | INTRAVENOUS | Status: AC | PRN
  Administered 2022-03-11: 100 mL via INTRAVENOUS

## 2022-03-11 NOTE — Progress Notes (Signed)
? ?Referring Physician(s): ?Cornett,Thomas ? ?Chief Complaint: ?The patient is seen in follow up today s/p pelvic abscess ? ?History of present illness: ?Maria Riley is a 31 y.o. female known to IR from prior pelvic drain placement as a sequela of a placental abruption requiring emergent cesarean section in [place] 12/2021 with multiple subsequent complications resulting in right hemicolectomy, right lower quadrant ostomy creation and presumed partial hysterectomy. After return to the [place], she continued with complications related to her emergency surgery including a large pelvic fluid collection identified by CT imaging 01/22/21.  She underwent aspiration and drainage 01/23/21. Her original IR drain was placed 2/17 by Dr. Vernard Gambles and remained in place for several weeks.  With additional manipulation and upsize the utility of the drain was maximized  and it was removed 02/12/22.  She returned to the ED 02/18/22 with recurrent pelvic fluid collection and underwent replacement of her anterior pelvic drain.  She presents to IR drain clinic today for assessment and drain management.  ? ?Maria Riley presents today in her usual state of health.  She is accompanied by a [place] interpreter.  She state she has been feeling better at home.  She has been flushing the drain daily with 0-10 mL output.  She continues medications as prescribed. She denies new or concerning symptoms.  ? ?Past Medical History:  ?Diagnosis Date  ? Medical history non-contributory   ? ? ?Past Surgical History:  ?Procedure Laterality Date  ? CESAREAN SECTION N/A 06/13/2018  ? Procedure: CESAREAN SECTION;  Surgeon: Aletha Halim, MD;  Location: [facility];  Service: Obstetrics;  Laterality: N/A;  ? IR CATHETER TUBE CHANGE  02/05/2022  ? IR IMAGE GUIDED DRAINAGE BY PERCUTANEOUS CATHETER  02/18/2022  ? IR RADIOLOGIST EVAL & MGMT  02/04/2022  ? IR RADIOLOGIST EVAL & MGMT  02/12/2022  ? IR RADIOLOGIST EVAL & MGMT  03/11/2022  ? MOUTH SURGERY    ? WISDOM TOOTH  EXTRACTION Bilateral   ? ? ?Allergies: ?Tape ? ?Medications: ?Prior to Admission medications   ?Medication Sig Start Date End Date Taking? Authorizing Provider  ?acetaminophen (TYLENOL) 325 MG tablet Take 2 tablets (650 mg total) by mouth every 6 (six) hours as needed for mild pain (or Fever >/= 101). 02/20/22 03/22/22  Pahwani, Michell Heinrich, MD  ?apixaban (ELIQUIS) 5 MG TABS tablet Take 1 tablet (5 mg total) by mouth 2 (two) times daily. 02/22/22 05/23/22  Kayleen Memos, DO  ?Multiple Vitamins-Minerals (CERTAVITE/ANTIOXIDANTS) TABS Take 1 tablet by mouth daily. 01/27/22 04/27/22  Kayleen Memos, DO  ?pantoprazole (PROTONIX) 40 MG tablet Take 1 tablet (40 mg total) by mouth daily. 02/21/22 03/23/22  Pahwani, Michell Heinrich, MD  ?fluticasone (FLONASE) 50 MCG/ACT nasal spray Place 1 spray into both nostrils daily. 01/02/21 04/25/21  Zigmund Gottron, NP  ?LO LOESTRIN FE 1 MG-10 MCG / 10 MCG tablet Take 1 tablet by mouth daily. 03/06/20 07/06/20  Shelly Bombard, MD  ?medroxyPROGESTERone (DEPO-PROVERA) 150 MG/ML injection Inject 1 mL (150 mg total) into the muscle every 3 (three) months. ?Patient not taking: Reported on 11/14/2018 07/18/18 07/06/20  Shelly Bombard, MD  ?  ? ?Family History  ?Problem Relation Age of Onset  ? Healthy Father   ? Alcohol abuse Neg Hx   ? Arthritis Neg Hx   ? Asthma Neg Hx   ? Birth defects Neg Hx   ? Cancer Neg Hx   ? COPD Neg Hx   ? Depression Neg Hx   ? Diabetes Neg  Hx   ? Drug abuse Neg Hx   ? Early death Neg Hx   ? Hearing loss Neg Hx   ? Heart disease Neg Hx   ? Hyperlipidemia Neg Hx   ? Hypertension Neg Hx   ? Kidney disease Neg Hx   ? Learning disabilities Neg Hx   ? Mental illness Neg Hx   ? Mental retardation Neg Hx   ? Miscarriages / Stillbirths Neg Hx   ? Stroke Neg Hx   ? Vision loss Neg Hx   ? Varicose Veins Neg Hx   ? ? ?Social History  ? ?Socioeconomic History  ? Marital status: Married  ?  Spouse name: Not on file  ? Number of children: Not on file  ? Years of education: Not on file  ? Highest  education level: Not on file  ?Occupational History  ? Not on file  ?Tobacco Use  ? Smoking status: Never  ? Smokeless tobacco: Never  ?Vaping Use  ? Vaping Use: Never used  ?Substance and Sexual Activity  ? Alcohol use: No  ? Drug use: No  ? Sexual activity: Yes  ?  Birth control/protection: None  ?Other Topics Concern  ? Not on file  ?Social History Narrative  ? Not on file  ? ?Social Determinants of Health  ? ?Financial Resource Strain: Not on file  ?Food Insecurity: Not on file  ?Transportation Needs: Not on file  ?Physical Activity: Not on file  ?Stress: Not on file  ?Social Connections: Not on file  ? ? ? ?Vital Signs: ?LMP 06/08/2021 (Approximate)  ? ?Physical Exam ?NAD, alert ?Abdomen: soft, non-tender.  Anterior pelvic drain in place.  Site tender, but intact.  Trace amount of clear fluid in bulb.  ? ?Imaging: ?IR Radiologist Eval & Mgmt ? ?Result Date: 03/11/2022 ?Please refer to notes tab for details about interventional procedure. (Op Note)  ? ?Labs: ? ?CBC: ?Recent Labs  ?  01/27/22 ?0225 02/17/22 ?1315 02/19/22 ?0206 02/20/22 ?0235  ?WBC 4.6 10.6* 8.4 9.6  ?HGB 12.1 12.4 9.5* 10.5*  ?HCT 36.9 36.8 29.1* 32.2*  ?PLT 219 422* 309 323  ? ? ?COAGS: ?Recent Labs  ?  02/18/22 ?1018 02/18/22 ?2002 02/19/22 ?0206 02/20/22 ?0235  ?APTT 119* 98* 65* 47*  ? ? ?BMP: ?Recent Labs  ?  02/17/22 ?1315 02/19/22 ?0206 02/20/22 ?0235 02/20/22 ?1107  ?NA 132* 137 141 139  ?K 4.3 3.3* 3.6 3.9  ?CL 102 108 117* 117*  ?CO2 19* 16* 15* 13*  ?GLUCOSE 86 70 86 91  ?BUN 10 <5* 5* 6  ?CALCIUM 9.2 7.9* 7.9* 8.0*  ?CREATININE 1.07* 0.74 1.54* 1.52*  ?GFRNONAA >60 >60 47* 47*  ? ? ?LIVER FUNCTION TESTS: ?Recent Labs  ?  04/15/21 ?1102 01/22/22 ?1739 01/23/22 ?0337 02/17/22 ?1315  ?BILITOT 0.5 0.4 0.5 0.6  ?AST 21 19 16  38  ?ALT 20 17 19  37  ?ALKPHOS 63 158* 142* 141*  ?PROT 7.3 8.1 7.1 8.5*  ?ALBUMIN 4.1 3.9 3.5 3.6  ? ? ?Assessment: ?Maria Riley is a 31 y.o. female known to IR from prior pelvic drain placement due to placental  abruption requiring emergent cesarean section in [place] with multiple subsequent complications resulting in right hemicolectomy, right lower quadrant ostomy creation and presumed partial hysterectomy.  She has developed a recurrent anterior pelvic fluid collection s/p drainage 01/23/22, and again 02/18/22 by Dr. Maryelizabeth Kaufmann.  She presents today for follow-up and management of her drain.  ? ?Patient reports improvement at home.  She has been able to eat and drink with tolerance.  She has good ostomy output.  ?She has been flushing the drain as instruction.  ?CT imaging and drain injection performed today and reviewed by Dr. Maryelizabeth Kaufmann who notes resolve of her fluid collection with closed abscess cavity.  No evidence of fistulous connection.  ? ?Extensive discussion held with patient at bedside today regarding her significant risk of repeat infection due to ongoing inflammation and continued healing.  Despite this, the drain has again facilitated complete drainage and is no longer of clinical benefit. Discussed the risk of removal  with return of infection vs. Leaving drain in place with concern for nidus for infection.  Patient wishes to proceed with drain removal as is indicated by available imaging today.  ?Drain removed by this PA in its entirety without complication.  ?Dressing placed.  ?Case instructions given.   ?Patient verbalizes understanding via interpreter.  ? ?No current needs in IR at this time.  ? ?Signed: ?Docia Barrier, PA ?03/11/2022, 3:22 PM ? ? ?Please refer to Dr. Maryelizabeth Kaufmann attestation of this note for management and plan.  ? ? ? ? ? ? ?

## 2022-03-19 ENCOUNTER — Encounter (INDEPENDENT_AMBULATORY_CARE_PROVIDER_SITE_OTHER): Payer: Self-pay | Admitting: Primary Care

## 2022-03-19 ENCOUNTER — Ambulatory Visit (INDEPENDENT_AMBULATORY_CARE_PROVIDER_SITE_OTHER): Payer: Self-pay | Admitting: *Deleted

## 2022-03-19 ENCOUNTER — Ambulatory Visit (INDEPENDENT_AMBULATORY_CARE_PROVIDER_SITE_OTHER): Payer: Medicaid Other | Admitting: Primary Care

## 2022-03-19 VITALS — BP 102/73 | HR 83 | Temp 98.1°F | Ht 59.0 in | Wt 124.8 lb

## 2022-03-19 DIAGNOSIS — R350 Frequency of micturition: Secondary | ICD-10-CM

## 2022-03-19 LAB — POCT URINALYSIS DIP (CLINITEK)
Bilirubin, UA: NEGATIVE
Glucose, UA: NEGATIVE mg/dL
Ketones, POC UA: NEGATIVE mg/dL
Nitrite, UA: NEGATIVE
POC PROTEIN,UA: 100 — AB
Spec Grav, UA: 1.015 (ref 1.010–1.025)
Urobilinogen, UA: 0.2 E.U./dL
pH, UA: 6 (ref 5.0–8.0)

## 2022-03-19 MED ORDER — PHENAZOPYRIDINE HCL 100 MG PO TABS: 100.0000 mg | ORAL_TABLET | Freq: Three times a day (TID) | ORAL | 0 refills | Status: DC | PRN

## 2022-03-19 NOTE — Patient Instructions (Signed)
Infección urinaria en los adultos °Urinary Tract Infection, Adult °Una infección urinaria (IU) puede ocurrir en cualquier lugar de las vías urinarias. Las vías urinarias incluyen a los riñones, los uréteres, la vejiga y la uretra. Estos órganos fabrican, almacenan y eliminan la orina del organismo. °La IU alta afecta los uréteres y los riñones. La IU baja afecta la vejiga y la uretra. °¿Cuáles son las causas? °La mayoría de las infecciones de las vías urinarias es causada por la presencia de bacterias en la zona genital, alrededor de la uretra, por donde sale la orina del cuerpo. Estas bacterias proliferan y causan inflamación en las vías urinarias. °¿Qué incrementa el riesgo? °Es más probable que sufra esta afección si: °Tiene colocado un catéter urinario permanente. °No puede controlar cuándo orinar o defecar (incontinencia). °Es mujer y usted: °Utiliza espermicida o diafragma como método anticonceptivo. °Tiene niveles bajos de estrógenos. °Está embarazada. °Tiene ciertos genes que aumentan su riesgo. °Es sexualmente activa. °Toma antibióticos. °Tiene una afección que causa que el flujo de orina sea lento, como: °Próstata agrandada, si usted es hombre. °Obstrucción de la uretra. °Cálculo renal. °Una afección nerviosa que afecta el control de la vejiga (vejiga neurógena). °No bebe lo suficiente o no orina con frecuencia. °Tiene ciertas enfermedades crónicas, como: °Diabetes. °Un sistema que combate las enfermedades (sistemainmunitario) debilitado. °Anemia drepanocítica. °Gota. °Lesión en la médula espinal. °¿Cuáles son los signos o síntomas? °Los síntomas de esta afección incluyen: °Necesidad inmediata (urgencia) de orinar. °Micción frecuente. Esto puede incluir pequeñas cantidades de orina cada vez que orina. °Ardor o dolor al orinar. °Presencia de sangre en la orina. °Orina con mal olor u olor atípico. °Dificultad para orinar. °Orina turbia. °Secreción vaginal, si es mujer. °Dolor en el abdomen o en la parte  inferior de la espalda. °Es posible que también tenga: °Vómitos o disminución del apetito. °Confusión. °Irritabilidad o cansancio. °Fiebre o escalofríos. °Diarrea. °El primer síntoma en los adultos mayores puede ser la confusión. En algunos casos, es posible que no tengan síntomas hasta que la infección empeore. °¿Cómo se diagnostica? °Esta afección se diagnostica en función de sus antecedentes médicos y de un examen físico. También pueden hacerle otras pruebas, incluidas las siguientes: °Análisis de orina. °Análisis de sangre. °Pruebas de infecciones de transmisión sexual (ITS). °Si ha tenido más de una infección urinaria (IU), se pueden hacer estudios de diagnóstico por imágenes o una cistoscopia para determinar la causa de las infecciones. °¿Cómo se trata? °El tratamiento de esta afección incluye lo siguiente: °Antibióticos. °Medicamentos de venta libre para aliviar las molestias. °Beber una cantidad suficiente agua para mantenerse hidratado. °Si tiene infecciones con frecuencia o tiene otras afecciones, como un cálculo renal, es posible que deba ver a un médico especialista en las vías urinarias (urólogo). °En casos poco frecuentes, las infecciones urinarias pueden provocar sepsis. La sepsis es una afección potencialmente mortal que se produce cuando el cuerpo responde a una infección. La sepsis se trata en el hospital con antibióticos, líquidos y otros medicamentos que se administran por vía intravenosa. °Siga estas instrucciones en su casa: °Medicamentos °Use los medicamentos de venta libre y los recetados solamente como se lo haya indicado el médico. °Si le recetaron un antibiótico, tómelo como se lo haya indicado el médico. No deje de usar el antibiótico aunque comience a sentirse mejor. °Instrucciones generales °Asegúrese de hacer lo siguiente: °Vaciar la vejiga con frecuencia y en su totalidad. No contener la orina durante largos períodos. °Vaciar la vejiga después de tener sexo. °Limpiarse de atrás hacia  adelante después de   orinar o defecar, si es mujer. Usar cada trozo de papel higiénico solo una vez cuando se limpie. °Beber suficiente líquido como para mantener la orina de color amarillo pálido. °Cumpla con todas las visitas de seguimiento. Esto es importante. °Comuníquese con un médico si: °Los síntomas no mejoran después de 1 o 2 días de tratamiento. °Los síntomas desaparecen y luego vuelven a aparecer. °Solicite ayuda de inmediato si: °Siente dolor intenso en la espalda o en la parte inferior del abdomen. °Tiene fiebre o escalofríos. °Tiene náuseas o vómitos. °Resumen °Una infección urinaria (IU) es una infección en cualquier parte de las vías urinarias, que incluyen los riñones, los uréteres, la vejiga y la uretra. °La mayoría de las infecciones de las vías urinarias es causada por bacterias en la zona genital. °El tratamiento de esta afección suele incluir antibióticos. °Si le recetaron un antibiótico, tómelo como se lo haya indicado el médico. No deje de usar el antibiótico aunque comience a sentirse mejor. °Cumpla con todas las visitas de seguimiento. Esto es importante. °Esta información no tiene como fin reemplazar el consejo del médico. Asegúrese de hacerle al médico cualquier pregunta que tenga. °Document Revised: 09/16/2020 Document Reviewed: 09/16/2020 °Elsevier Patient Education © 2022 [employer] ° °

## 2022-03-19 NOTE — Telephone Encounter (Signed)
?  Chief Complaint: burning with urination ?Symptoms: urinary frequency in am , burning with urination ?Frequency: started Tuesday  ?Pertinent Negatives: Patient denies abdominal pain , no flank pain, no report of fever, no blood in urine  ?Disposition: [] ED /[] Urgent Care (no appt availability in office) / [x] Appointment(In office/virtual)/ []  [facility]/ [] Home Care/ [] Refused Recommended Disposition /[]  Mobile Bus/ []  Follow-up with PCP ?Additional Notes: na ? ? ? ? Reason for Disposition ? Urinating more frequently than usual (i.e., frequency) ? ?Answer Assessment - Initial Assessment Questions ?1. SYMPTOM: "What's the main symptom you're concerned about?" (e.g., frequency, incontinence) ?    Frequency in am , pain burning with urination ?2. ONSET: "When did the  urinary burning   start?" ?    Tuesday  ?3. PAIN: "Is there any pain?" If Yes, ask: "How bad is it?" (Scale: 1-10; mild, moderate, severe) ?    Yes burning moderate  ?4. CAUSE: "What do you think is causing the symptoms?" ?   Not sure  ?5. OTHER SYMPTOMS: "Do you have any other symptoms?" (e.g., fever, flank pain, blood in urine, pain with urination) ?    Pain with urination ?6. PREGNANCY: "Is there any chance you are pregnant?" "When was your last menstrual period?" ?    na ? ?Protocols used: Urinary Symptoms-A-AH ? ?

## 2022-03-19 NOTE — Progress Notes (Signed)
? ?   [facility] ? ?Subjective:  ? ? [name] is a 31 y.o. female who complains of burning with urination, frequency, and hesitancy for 2 days.  Patient also complains of back pain. Patient denies congestion, fever, headache, sorethroat, stomach ache, and vaginal discharge.  Patient does not have a history of recurrent UTI.  Patient does not have a history of pyelonephritis. ?The following portions of the patient's history were reviewed and updated as appropriate: allergies, current medications, past family history, past medical history, past social history, and past surgical history. ?Review of Systems ?Pertinent items noted in HPI and remainder of comprehensive ROS otherwise negative.  ?  ?Objective:  ? ? BP 102/73 (BP Location: Right Arm, Patient Position: Sitting, Cuff Size: Normal)   Pulse 83   Temp 98.1 ?F (36.7 ?C) (Oral)   Ht 4\' 11"  (1.499 m)   Wt 124 lb 12.8 oz (56.6 kg)   LMP 06/08/2021 (Approximate)   SpO2 96%   BMI 25.21 kg/m?  ?Physical Exam ?Vitals reviewed.  ?Constitutional:   ?   Appearance: Normal appearance.  ?HENT:  ?   Head: Normocephalic.  ?   Right Ear: Tympanic membrane and external ear normal.  ?   Left Ear: Tympanic membrane and external ear normal.  ?   Nose: Nose normal.  ?Cardiovascular:  ?   Rate and Rhythm: Normal rate and regular rhythm.  ?Pulmonary:  ?   Effort: Pulmonary effort is normal.  ?   Breath sounds: Normal breath sounds.  ?Abdominal:  ?   General: Abdomen is flat. Bowel sounds are normal.  ?   Palpations: Abdomen is soft.  ?Musculoskeletal:     ?   General: Normal range of motion.  ?   Cervical back: Normal range of motion.  ?Skin: ?   General: Skin is warm and dry.  ?Neurological:  ?   Mental Status: She is alert and oriented to person, place, and time.  ?Psychiatric:     ?   Mood and Affect: Mood normal.     ?   Behavior: Behavior normal.     ?   Thought Content: Thought content normal.     ?   Judgment: Judgment normal.  ?   ? ? ?Laboratory:  ?Urine dipstick shows negative for glucose, negative for hemoglobin, negative for ketones, moderate for leukocyte esterase,negative for nitrites, negative for protein, and negative for urobilinogen.   ?  ?Assessment:  ? ? UTI  ?  ? 1. Medications:  sent out for culture  ?2. Maintain adequate hydration ?3. Follow up if symptoms not improving, and prn.  ? ?This note has been created with 08/09/2021. Any transcriptional errors are unintentional.  ? ?Education officer, environmental, NP ?03/19/2022, 1:39 PM  ?

## 2022-03-20 DIAGNOSIS — R350 Frequency of micturition: Secondary | ICD-10-CM | POA: Diagnosis not present

## 2022-03-23 ENCOUNTER — Telehealth (INDEPENDENT_AMBULATORY_CARE_PROVIDER_SITE_OTHER): Payer: Self-pay | Admitting: Primary Care

## 2022-03-23 LAB — CULTURE, URINE COMPREHENSIVE

## 2022-03-23 NOTE — Telephone Encounter (Signed)
Pt called, via interpretor to say that she has been taking the medicine prescribed since her appt on 03-19-22 but it is not helping. She still has pain/burning when urinating. Her urine is still very orange and has a very strong smell. Please advise pt. ?

## 2022-03-24 ENCOUNTER — Telehealth (INDEPENDENT_AMBULATORY_CARE_PROVIDER_SITE_OTHER): Payer: Self-pay

## 2022-03-24 ENCOUNTER — Other Ambulatory Visit (INDEPENDENT_AMBULATORY_CARE_PROVIDER_SITE_OTHER): Payer: Self-pay | Admitting: Primary Care

## 2022-03-24 ENCOUNTER — Ambulatory Visit (HOSPITAL_COMMUNITY)
Admission: RE | Admit: 2022-03-24 | Discharge: 2022-03-24 | Disposition: A | Discharge status: Home / Self Care | Payer: Medicaid Other | Source: Ambulatory Visit | Attending: Nurse Practitioner | Admitting: Nurse Practitioner

## 2022-03-24 DIAGNOSIS — Z932 Ileostomy status: Secondary | ICD-10-CM | POA: Insufficient documentation

## 2022-03-24 DIAGNOSIS — Z432 Encounter for attention to ileostomy: Secondary | ICD-10-CM

## 2022-03-24 DIAGNOSIS — N390 Urinary tract infection, site not specified: Secondary | ICD-10-CM

## 2022-03-24 DIAGNOSIS — R109 Unspecified abdominal pain: Secondary | ICD-10-CM | POA: Insufficient documentation

## 2022-03-24 MED ORDER — AMOXICILLIN-POT CLAVULANATE 875-125 MG PO TABS: 1.0000 | ORAL_TABLET | Freq: Two times a day (BID) | ORAL | 0 refills | Status: DC

## 2022-03-24 MED ORDER — FLUCONAZOLE 150 MG PO TABS: 150.0000 mg | ORAL_TABLET | Freq: Every day | ORAL | 1 refills | Status: DC

## 2022-03-24 NOTE — Telephone Encounter (Signed)
-----   Message from Kerin Perna, NP sent at 03/24/2022  9:24 AM EDT ----- ?You have a  UTI. Antibiotics sent to pharmacy. Also sent 1 single pill called Diflucan. Take after you complete antibiotic; as antibiotic can cause yeast infection.  ?  ?

## 2022-03-24 NOTE — Discharge Instructions (Signed)
I called [facility] and your supplies will be sent today.  Last order was 02/09/22.   ?

## 2022-03-24 NOTE — Progress Notes (Signed)
[facility]  ? ?Reason for visit:  ?RLQ ileostomy ?HPI:  ?Right hemicolectomy with ileostomy ?ROS  ?Review of Systems  ?Gastrointestinal:  Positive for abdominal pain.  ?Skin:   ?     Tenderness with pouch removal  ?Psychiatric/Behavioral: Negative.    ?All other systems reviewed and are negative. ?Vital signs:  ?BP 102/81 (BP Location: Right Arm)   Pulse (!) 128   Temp (!) 97.5 ?F (36.4 ?C) (Oral)   Resp 18   LMP 06/08/2021 (Approximate)   SpO2 97%  ?Exam:  ?Physical Exam ?Vitals reviewed.  ?Abdominal:  ?   Palpations: Abdomen is soft.  ?Genitourinary: ?   Comments: Currently being treated for UTI ?Neurological:  ?   Mental Status: She is alert.  ?  ?Stoma type/location:  RLQ ileostomy ?Stomal assessment/size:  1"  ?Peristomal assessment:  Skin intact  tenderness with pouch removal.  She did not have skin protectant on today. She was out of supplies ?Treatment options for stomal/peristomal skin: barrier ring/skin prep/stoma powder ?Output: liquid yellow stool ?Ostomy pouching: 1pc.convex with stoma powder and skin prep barrier ring.  Belt at times. Not in use today.  ?Education provided:  WE discuss how to call [facility] to re-order supplies when needed.  I called [facility] and her last order was 02/07/22 and they will send out more today.  ? ?  ?Impression/dx  ?Ileostomy ?Discussion  ?Call clinic as needed.   ?Plan  ?Order supplies timely ? ? ? ?Visit time: 55 minutes.  ? ?Maple Hudson FNP-BC ? ?  ?

## 2022-03-24 NOTE — Telephone Encounter (Signed)
Contacted patient with [facility] [phone]) patient aware of UTI and medication sent to treat. Explained to patient how to take all medications. Also explained to patient that pyridium does change urine color to orange so no cause for alarm. Patient verbalized understanding of all results and information. Maryjean Morn, CMA  ?

## 2022-03-24 NOTE — Telephone Encounter (Signed)
Spoke with patient about this matter. It is documented in a telephone encounter along with results.  ?

## 2022-03-25 ENCOUNTER — Encounter (HOSPITAL_COMMUNITY): Payer: Self-pay | Admitting: Emergency Medicine

## 2022-03-25 ENCOUNTER — Emergency Department (HOSPITAL_COMMUNITY)
Admission: EM | Admit: 2022-03-25 | Discharge: 2022-03-25 | Disposition: A | Discharge status: Home / Self Care | Payer: Medicaid Other | Attending: Emergency Medicine | Admitting: Emergency Medicine

## 2022-03-25 ENCOUNTER — Other Ambulatory Visit: Payer: Self-pay

## 2022-03-25 ENCOUNTER — Ambulatory Visit (INDEPENDENT_AMBULATORY_CARE_PROVIDER_SITE_OTHER): Payer: Self-pay

## 2022-03-25 DIAGNOSIS — R112 Nausea with vomiting, unspecified: Secondary | ICD-10-CM | POA: Diagnosis not present

## 2022-03-25 DIAGNOSIS — Z7901 Long term (current) use of anticoagulants: Secondary | ICD-10-CM | POA: Diagnosis not present

## 2022-03-25 DIAGNOSIS — R300 Dysuria: Secondary | ICD-10-CM | POA: Diagnosis not present

## 2022-03-25 DIAGNOSIS — R519 Headache, unspecified: Secondary | ICD-10-CM | POA: Diagnosis not present

## 2022-03-25 DIAGNOSIS — G4489 Other headache syndrome: Secondary | ICD-10-CM | POA: Diagnosis not present

## 2022-03-25 LAB — URINALYSIS, ROUTINE W REFLEX MICROSCOPIC
Bilirubin Urine: NEGATIVE
Glucose, UA: NEGATIVE mg/dL
Hgb urine dipstick: NEGATIVE
Ketones, ur: NEGATIVE mg/dL
Nitrite: NEGATIVE
Protein, ur: 30 mg/dL — AB
Specific Gravity, Urine: 1.014 (ref 1.005–1.030)
pH: 8 (ref 5.0–8.0)

## 2022-03-25 LAB — CBC
HCT: 41 % (ref 36.0–46.0)
Hemoglobin: 13.7 g/dL (ref 12.0–15.0)
MCH: 28.7 pg (ref 26.0–34.0)
MCHC: 33.4 g/dL (ref 30.0–36.0)
MCV: 85.8 fL (ref 80.0–100.0)
Platelets: 230 10*3/uL (ref 150–400)
RBC: 4.78 MIL/uL (ref 3.87–5.11)
RDW: 13.9 % (ref 11.5–15.5)
WBC: 6 10*3/uL (ref 4.0–10.5)
nRBC: 0 % (ref 0.0–0.2)

## 2022-03-25 LAB — COMPREHENSIVE METABOLIC PANEL
ALT: 56 U/L — ABNORMAL HIGH (ref 0–44)
AST: 44 U/L — ABNORMAL HIGH (ref 15–41)
Albumin: 4.4 g/dL (ref 3.5–5.0)
Alkaline Phosphatase: 111 U/L (ref 38–126)
Anion gap: 8 (ref 5–15)
BUN: 12 mg/dL (ref 6–20)
CO2: 24 mmol/L (ref 22–32)
Calcium: 9.7 mg/dL (ref 8.9–10.3)
Chloride: 103 mmol/L (ref 98–111)
Creatinine, Ser: 1.04 mg/dL — ABNORMAL HIGH (ref 0.44–1.00)
GFR, Estimated: >60 mL/min (ref >60)
Glucose, Bld: 82 mg/dL (ref 70–99)
Potassium: 4.9 mmol/L (ref 3.5–5.1)
Sodium: 135 mmol/L (ref 135–145)
Total Bilirubin: 0.7 mg/dL (ref 0.3–1.2)
Total Protein: 8.6 g/dL — ABNORMAL HIGH (ref 6.5–8.1)

## 2022-03-25 LAB — I-STAT BETA HCG BLOOD, ED (MC, WL, AP ONLY): I-stat hCG, quantitative: <5 m[IU]/mL (ref <5)

## 2022-03-25 LAB — LIPASE, BLOOD: Lipase: 30 U/L (ref 11–51)

## 2022-03-25 MED ORDER — SODIUM CHLORIDE 0.9 % IV BOLUS
1000.0000 mL | Freq: Once | INTRAVENOUS | Status: AC
  Administered 2022-03-25: 1000 mL via INTRAVENOUS

## 2022-03-25 MED ORDER — NITROFURANTOIN MONOHYD MACRO 100 MG PO CAPS: 100.0000 mg | ORAL_CAPSULE | Freq: Two times a day (BID) | ORAL | 0 refills | Status: DC

## 2022-03-25 MED ORDER — SODIUM CHLORIDE 0.9 % IV BOLUS
1000.0000 mL | Freq: Once | INTRAVENOUS | Status: AC
Start: 2022-03-25 — End: 2022-03-25
  Administered 2022-03-25: 1000 mL via INTRAVENOUS

## 2022-03-25 MED ORDER — METOCLOPRAMIDE HCL 5 MG/ML IJ SOLN
10.0000 mg | Freq: Once | INTRAMUSCULAR | Status: AC
  Administered 2022-03-25: 10 mg via INTRAVENOUS
  Filled 2022-03-25: qty 2

## 2022-03-25 MED ORDER — ONDANSETRON HCL 4 MG/2ML IJ SOLN
4.0000 mg | Freq: Once | INTRAMUSCULAR | Status: AC
Start: 2022-03-25 — End: 2022-03-25
  Administered 2022-03-25: 4 mg via INTRAVENOUS
  Filled 2022-03-25: qty 2

## 2022-03-25 NOTE — Telephone Encounter (Signed)
Maria Riley [phone] interpreter called patient about n/v taking abt's. She was taking on a empty stomach. Advise to eat before taking medication.Then she inform us she was in the ED because she hurt so much. Will f/u after d/c ?

## 2022-03-25 NOTE — Telephone Encounter (Signed)
Please advise 

## 2022-03-25 NOTE — ED Triage Notes (Signed)
BIB EMS from home, complains of N/V and HA since yesterday. Daughter has strep, but pt has no throat complaints, colostomy bag present.  ? ?BP w/ EMS initially 90/palp, then 100/70.  ? ?

## 2022-03-25 NOTE — Discharge Instructions (Signed)
You were seen today for nausea and vomiting.  This began shortly after you began taking amoxicillin.  We will try a different antibiotic that will hopefully not cause any negative side effects.  I recommend follow-up with primary care as well for continued management.  If you develop sharp abdominal pain, chest pain, shortness of breath, or other emergent symptoms please return to the emergency department. ?

## 2022-03-25 NOTE — ED Notes (Signed)
Pt states she was diagnosed with a UTI yesterday and given antibiotics, took dose this morning. Wondered if HA was possible cause. Reports low appetite yesterday and today. ?

## 2022-03-25 NOTE — ED Provider Notes (Signed)
?[facility] DEPT ?Provider Note ? ? ?CSN: [id number] ?Arrival date & time: 03/25/22  1353 ? ?  ? ?History ? ?Chief Complaint  ?Patient presents with  ? Emesis  ? Headache  ? ? ?Maria Riley is a 31 y.o. female.  Patient complains of nausea and vomiting that began yesterday.  Also complains of mild headache began yesterday.  Patient was diagnosed with a urinary tract infection at primary care yesterday.  Was started on amoxicillin yesterday.  Symptoms began shortly after beginning amoxicillin.  Patient denies chest pain, shortness of breath, vaginal discharge.  Patient endorses dysuria, headache, nausea, vomiting.  Patient denies abdominal pain.  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  Patient states her ileostomy was in January of this year.  Past medical history significant for DIC, MDD, DVT history, UTI, ileostomy, hyponatremia, hypokalemia, history of sepsis. ? ?HPI ? ?  ? ?Home Medications ?Prior to Admission medications   ?Medication Sig Start Date End Date Taking? Authorizing Provider  ?nitrofurantoin, macrocrystal-monohydrate, (MACROBID) 100 MG capsule Take 1 capsule (100 mg total) by mouth 2 (two) times daily. 03/25/22  Yes Dorothyann Peng, PA-C  ?amoxicillin-clavulanate (AUGMENTIN) 875-125 MG tablet Take 1 tablet by mouth 2 (two) times daily. 03/24/22   Kerin Perna, NP  ?apixaban (ELIQUIS) 5 MG TABS tablet Take 1 tablet (5 mg total) by mouth 2 (two) times daily. 02/22/22 05/23/22  Kayleen Memos, DO  ?fluconazole (DIFLUCAN) 150 MG tablet Take 1 tablet (150 mg total) by mouth daily. 03/24/22   Kerin Perna, NP  ?Multiple Vitamins-Minerals (CERTAVITE/ANTIOXIDANTS) TABS Take 1 tablet by mouth daily. 01/27/22 04/27/22  Kayleen Memos, DO  ?pantoprazole (PROTONIX) 40 MG tablet Take 1 tablet (40 mg total) by mouth daily. 02/21/22 03/23/22  Pahwani, Michell Heinrich, MD  ?phenazopyridine (PYRIDIUM) 100 MG tablet Take 1 tablet (100 mg total) by mouth 3  (three) times daily as needed for pain. 03/19/22   Kerin Perna, NP  ?fluticasone (FLONASE) 50 MCG/ACT nasal spray Place 1 spray into both nostrils daily. 01/02/21 04/25/21  Zigmund Gottron, NP  ?LO LOESTRIN FE 1 MG-10 MCG / 10 MCG tablet Take 1 tablet by mouth daily. 03/06/20 07/06/20  Shelly Bombard, MD  ?medroxyPROGESTERone (DEPO-PROVERA) 150 MG/ML injection Inject 1 mL (150 mg total) into the muscle every 3 (three) months. ?Patient not taking: Reported on 11/14/2018 07/18/18 07/06/20  Shelly Bombard, MD  ?   ? ?Allergies    ?Tape   ? ?Review of Systems   ?Review of Systems  ?Constitutional:  Negative for fever.  ?Respiratory:  Negative for shortness of breath.   ?Cardiovascular:  Negative for chest pain.  ?Gastrointestinal:  Positive for nausea and vomiting. Negative for abdominal pain.  ?Genitourinary:  Positive for dysuria. Negative for hematuria and vaginal discharge.  ?Neurological:  Positive for headaches.  ? ?Physical Exam ?Updated Vital Signs ?BP 90/64   Pulse 78   Temp 98.6 ?F (37 ?C) (Oral)   Resp 16   LMP 06/08/2021 (Approximate)   SpO2 99%  ?Physical Exam ?Vitals and nursing note reviewed.  ?Constitutional:   ?   General: She is not in acute distress. ?   Appearance: She is normal weight.  ?HENT:  ?   Head: Normocephalic and atraumatic.  ?   Mouth/Throat:  ?   Mouth: Mucous membranes are moist.  ?Eyes:  ?   Extraocular Movements: Extraocular movements intact.  ?Cardiovascular:  ?   Rate and Rhythm: Normal  rate and regular rhythm.  ?   Heart sounds: Normal heart sounds.  ?Pulmonary:  ?   Effort: Pulmonary effort is normal.  ?   Breath sounds: Normal breath sounds.  ?Abdominal:  ?   General: There is no distension.  ?   Palpations: Abdomen is soft.  ?   Tenderness: There is no abdominal tenderness.  ?   Comments: Ileostomy RLQ  ?Musculoskeletal:  ?   Cervical back: Normal range of motion.  ?Skin: ?   General: Skin is warm and dry.  ?Neurological:  ?   Mental Status: She is alert.  ? ? ?ED  Results / Procedures / Treatments   ?Labs ?(all labs ordered are listed, but only abnormal results are displayed) ?Labs Reviewed  ?COMPREHENSIVE METABOLIC PANEL - Abnormal; Notable for the following components:  ?    Result Value  ? Creatinine, Ser 1.04 (*)   ? Total Protein 8.6 (*)   ? AST 44 (*)   ? ALT 56 (*)   ? All other components within normal limits  ?URINALYSIS, ROUTINE W REFLEX MICROSCOPIC - Abnormal; Notable for the following components:  ? Protein, ur 30 (*)   ? Leukocytes,Ua LARGE (*)   ? Bacteria, UA RARE (*)   ? Non Squamous Epithelial 0-5 (*)   ? All other components within normal limits  ?URINE CULTURE  ?LIPASE, BLOOD  ?CBC  ?I-STAT BETA HCG BLOOD, ED ([facility], [facility], [facility] ONLY)  ? ? ?EKG ?None ? ?Radiology ?No results found. ? ?Procedures ?Procedures  ? ?Medications Ordered in ED ?Medications  ?sodium chloride 0.9 % bolus 1,000 mL (0 mLs Intravenous Stopped 03/25/22 1733)  ?ondansetron (ZOFRAN) injection 4 mg (4 mg Intravenous Given 03/25/22 1541)  ?metoCLOPramide (REGLAN) injection 10 mg (10 mg Intravenous Given 03/25/22 1541)  ?sodium chloride 0.9 % bolus 1,000 mL (1,000 mLs Intravenous New Bag/Given 03/25/22 1737)  ? ? ?ED Course/ Medical Decision Making/ A&P ?  ?                        ?Medical Decision Making ?Amount and/or Complexity of Data Reviewed ?Labs: ordered. ? ?Risk ?Prescription drug management. ? ? ?This patient presents to the ED for concern of nausea and vomiting, this involves an extensive number of treatment options, and is a complaint that carries with it a high risk of complications and morbidity.  The differential diagnosis includes reaction to antibiotics, viral GI illness, appendicitis, cholecystitis, pancreatitis ? ? ?Co morbidities that complicate the patient evaluation ? ?History of ileostomy ? ? ?Additional history obtained: ? ? ?External records from outside source obtained and reviewed including family medicine records from yesterday and today confirming UTI and amoxicillin  prescription ? ? ?Lab Tests: ? ?I Ordered, and personally interpreted labs.  The pertinent results include: Lipase 30, creatinine 1.04 (improved from previous results) ? ? ?Problem List / ED Course / Critical interventions / Medication management ? ? ?I ordered medication including normal saline for patient and Zofran for nausea; reglan for headache ?Reevaluation of the patient after these medicines showed that the patient improved ?I have reviewed the patients home medicines and have made adjustments as needed ? ? ?Test / Admission - Considered: ? ?The patient has no abdominal tenderness.  No tenderness in the right lower quadrant.  Murphy sign.  Lipase of 30.  Appendicitis, cholecystitis, pancreatitis very low on differential. ? ?The patient's symptoms began as she began taking the amoxicillin.  Nausea and vomiting are known side effects  of amoxicillin.  She feels much better after fluid bolus and medication. Plan on changing antibiotic to Macrobid to see if she tolerates better. This may be antibiotic reaction vs GI illness. ?The patient does have mild hypotension but states it is normal for her. Previous visits show some mild hypotension. Patient given fluid.  ? ? There is no indication for further work-up or admission at the current time.  Plan on discharge home with return precautions including change of mental status, increased abdominal pain, inability to tolerate oral fluids ? ?Final Clinical Impression(s) / ED Diagnoses ?Final diagnoses:  ?Nausea and vomiting, unspecified vomiting type  ? ? ?Rx / DC Orders ?ED Discharge Orders   ? ?      Ordered  ?  nitrofurantoin, macrocrystal-monohydrate, (MACROBID) 100 MG capsule  2 times daily       ? 03/25/22 1723  ? ?  ?  ? ?  ? ? ?  ?Dorothyann Peng, PA-C ?03/25/22 1821 ? ?  ?Varney Biles, MD ?04/04/22 1347 ? ?

## 2022-03-25 NOTE — Telephone Encounter (Signed)
?  Used Spanish interpreter [phone] ?Chief Complaint: Pt. Started antibiotic yesterday and shortly after started vomiting. ?Symptoms: Vomiting, nausea ?Frequency: Has vomited 2 x today ?Pertinent Negatives: Patient denies diarrhea ?Disposition: [] ED /[] Urgent Care (no appt availability in office) / [] Appointment(In office/virtual)/ []  [facility]/ [] Home Care/ [] Refused Recommended Disposition /[] [facility]/ []  Follow-up with PCP ?Additional Notes: Please advise pt.  ?Answer Assessment - Initial Assessment Questions ?1. VOMITING SEVERITY: "How many times have you vomited in the past 24 hours?"  ?   - MILD:  1 - 2 times/day ?   - MODERATE: 3 - 5 times/day, decreased oral intake without significant weight loss or symptoms of dehydration ?   - SEVERE: 6 or more times/day, vomits everything or nearly everything, with significant weight loss, symptoms of dehydration  ?    Today - 2 ?2. ONSET: "When did the vomiting begin?"  ?    Yesterday ?3. FLUIDS: "What fluids or food have you vomited up today?" "Have you been able to keep any fluids down?" ?    No ?4. ABDOMINAL PAIN: "Are your having any abdominal pain?" If yes : "How bad is it and what does it feel like?" (e.g., crampy, dull, intermittent, constant)  ?    Gas discomfort ?5. DIARRHEA: "Is there any diarrhea?" If Yes, ask: "How many times today?"  ?    No ?6. CONTACTS: "Is there anyone else in the family with the same symptoms?"  ?    No ?7. CAUSE: "What do you think is causing your vomiting?" ?    Maybe antibiotic ?8. HYDRATION STATUS: "Any signs of dehydration?" (e.g., dry mouth [not only dry lips], too weak to stand) "When did you last urinate?" ?    No ?9. OTHER SYMPTOMS: "Do you have any other symptoms?" (e.g., fever, headache, vertigo, vomiting blood or coffee grounds, recent head injury) ?    Nausea ?10. PREGNANCY: "Is there any chance you are pregnant?" "When was your last menstrual period?" ?      No ? ?Protocols used:  Vomiting-A-AH ? ?

## 2022-03-26 ENCOUNTER — Telehealth: Payer: Self-pay

## 2022-03-26 LAB — URINE CULTURE: Culture: NO GROWTH

## 2022-03-26 NOTE — Telephone Encounter (Signed)
Transition Care Management Unsuccessful Follow-up Telephone Call ? ?Date of discharge and from where:  03/25/2022 from [facility] ? ?Attempts:  1st Attempt ? ?Reason for unsuccessful TCM follow-up call:  Left voice message ? ? ? ?

## 2022-03-30 NOTE — Telephone Encounter (Signed)
Transition Care Management Follow-up Telephone Call ?Date of discharge and from where: 03/25/2022-[facility]  ?How have you been since you were released from the hospital? Pt stated she is doing fine and has finished taking her medications.  ?Any questions or concerns? No ? ?Items Reviewed: ?Did the pt receive and understand the discharge instructions provided? Yes  ?Medications obtained and verified? Yes  ?Other? No  ?Any new allergies since your discharge? No  ?Dietary orders reviewed? No ?Do you have support at home? Yes  ? ?Home Care and Equipment/Supplies: ?Were home health services ordered? not applicable ?If so, what is the name of the agency? N/A  ?Has the agency set up a time to come to the patient's home? not applicable ?Were any new equipment or medical supplies ordered?  No ?What is the name of the medical supply agency? N/A ?Were you able to get the supplies/equipment? not applicable ?Do you have any questions related to the use of the equipment or supplies? No ? ?Functional Questionnaire: (I = Independent and D = Dependent) ?ADLs: I ? ?Bathing/Dressing- I ? ?Meal Prep- I ? ?Eating- I ? ?Maintaining continence- I ? ?Transferring/Ambulation- I ? ?Managing Meds- I ? ?Follow up appointments reviewed: ? ?PCP Hospital f/u appt confirmed? Yes  Scheduled to see PCP on 04/22/2022. ?Specialist Hospital f/u appt confirmed? No   ?Are transportation arrangements needed? No  ?If their condition worsens, is the pt aware to call PCP or go to the Emergency Dept.? Yes ?Was the patient provided with contact information for the PCP's office or ED? Yes ?Was to pt encouraged to call back with questions or concerns? Yes  ?

## 2022-04-01 DIAGNOSIS — R300 Dysuria: Secondary | ICD-10-CM | POA: Diagnosis not present

## 2022-04-01 DIAGNOSIS — Z932 Ileostomy status: Secondary | ICD-10-CM | POA: Diagnosis not present

## 2022-04-22 ENCOUNTER — Encounter (INDEPENDENT_AMBULATORY_CARE_PROVIDER_SITE_OTHER): Payer: Self-pay | Admitting: Primary Care

## 2022-04-22 ENCOUNTER — Ambulatory Visit (INDEPENDENT_AMBULATORY_CARE_PROVIDER_SITE_OTHER): Payer: Medicaid Other | Admitting: Primary Care

## 2022-04-22 VITALS — BP 106/73 | HR 67 | Temp 97.9°F | Ht 59.0 in | Wt 121.4 lb

## 2022-04-22 DIAGNOSIS — Z86718 Personal history of other venous thrombosis and embolism: Secondary | ICD-10-CM

## 2022-04-22 DIAGNOSIS — D65 Disseminated intravascular coagulation [defibrination syndrome]: Secondary | ICD-10-CM | POA: Diagnosis not present

## 2022-04-22 NOTE — Progress Notes (Signed)
    [facility]      [id number]  [id number]  DOB - 07/01/1991 Maria Riley [id number] interpreter  [name] is a 31 y.o. female who presents to the office today for a preoperative consultation at the request of surgeon [place] who plans on performing.  This consultation is requested for the specific conditions prompting preoperative evaluation (i.e. because of potential affect on operative risk): DVT and anticoagulation.. .. Patient does not have objections to receiving blood products if needed.  The following portions of the patient's history were reviewed and updated as appropriate: allergies, current medications, past family history, past medical history, past social history, and past surgical history.  Review of Systems Pertinent items noted in HPI and remainder of comprehensive ROS otherwise negative.    Objective:    BP 106/73 (BP Location: Right Arm, Patient Position: Sitting, Cuff Size: Normal)   Pulse 67   Temp 97.9 F (36.6 C) (Oral)   Ht 4\' 11"  (1.499 m)   Wt 121 lb 6.4 oz (55.1 kg)   LMP 06/08/2021 (Approximate)   SpO2 100%   BMI 24.52 kg/m   General appearance: alert, cooperative, and appears stated age Head: Normocephalic, without obvious abnormality, atraumatic Eyes: conjunctivae/corneas clear. PERRL, EOM's intact. Fundi benign. Ears: normal TM's and external ear canals both ears Nose: Nares normal. Septum midline. Mucosa normal. No drainage or sinus tenderness. Neck: no adenopathy, no carotid bruit, no JVD, supple, symmetrical, trachea midline, and thyroid not enlarged, symmetric, no tenderness/mass/nodules Back: symmetric, no curvature. ROM normal. No CVA tenderness. Lungs: clear to auscultation bilaterally Heart: regular rate and rhythm, S1, S2 normal, no murmur, click, rub or gallop Abdomen: soft, non-tender; bowel sounds normal; no masses,  no organomegaly Extremities: extremities normal, atraumatic, no cyanosis or  edema Pulses: 2+ and symmetric  Predictors of intubation difficulty: Morbid obesity? No Body mass index is 24.52 kg/m.  Anatomically abnormal facies? no Prominent incisors? Receding mandible? no Short, thick neck? no Neck range of motion: normal    Assessment:  [name] is a 31 y.o. female  with planned surgery as above.    Known risk factors for perioperative complications: None   Difficulty with intubation unknown do not  anticipated.  Cardiac Risk Estimation: low  Current medications which may produce withdrawal symptoms if withheld perioperatively: completed Eliquis     Plan:   1 . Preoperative workup as follows ECG, hemoglobin, hematocrit, electrolytes. 2. Change in medication regimen before surgery: none, continue medication regimen including morning of surgery, with sip of water. 3. Prophylaxis for cardiac events with perioperative beta-blockers: should be considered, specific regimen per anesthesia. 4. Invasive hemodynamic monitoring perioperatively: should be considered. 5. Deep vein thrombosis prophylaxis postoperatively:regimen to be chosen by surgical team. 6. Surveillance for postoperative MI with ECG immediately postoperatively and on postoperative days 1 and 2 AND troponin levels 24 hours postoperatively and on day 4 or hospital discharge (whichever comes first): at the discretion of anesthesiologist. 7. Other measures:     This note has been created with 26. Any transcriptional errors are unintentional.   Education officer, environmental, NP 04/27/2022, 1:56 PM

## 2022-04-24 ENCOUNTER — Telehealth: Payer: Self-pay | Admitting: Physician Assistant

## 2022-04-24 NOTE — Telephone Encounter (Signed)
Scheduled appt per 5/17 referral, used interpreter services. Pt is aware of appt date and time. Pt is aware to arrive 15 mins prior to appt time and to bring and updated insurance card. Pt is aware of appt location.

## 2022-04-25 IMAGING — XA IR IMAGE GUIDED DRAINAGE BY PERCUTANEOUS CATHETER
6 series · 7 of 7 positions shown · non-contrast
Comparison: CT [facility], 02/17/2022 and 02/12/2022.

INDICATION: Recurrent LEFT lower quadrant abscess

EXAM:
FLUOROSCOPIC GUIDED PLACEMENT OF PELVIC DRAINAGE CATHETER
TECHNIQUE: Informed written consent was obtained from the the patient and/or
patient's representative after a discussion of the risks, benefits
and alternatives to treatment. Questions regarding the procedure
were encouraged and answered. A timeout was performed prior to the
initiation of the procedure.

[Series 1: fl (-) angio · 1 of 1 slices shown (1 of 6)]
[im 1/1]
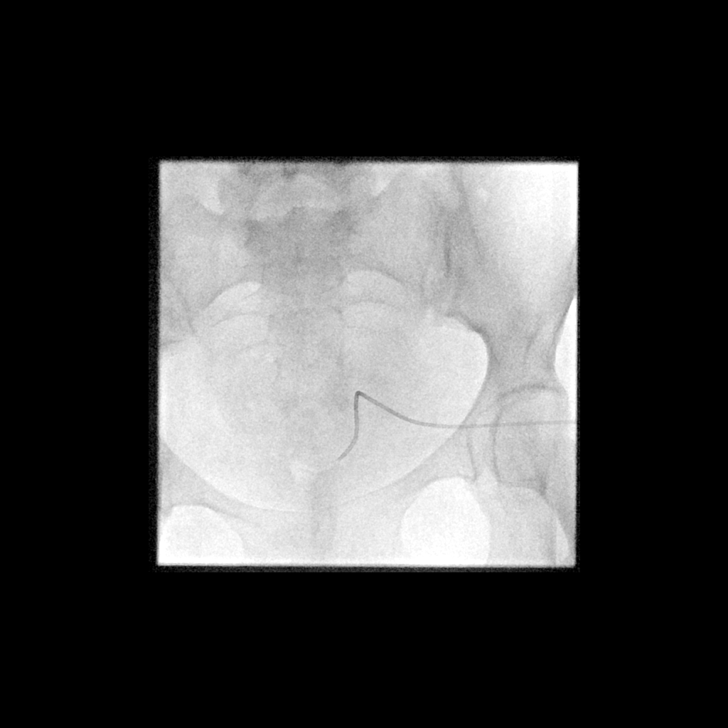

[Series 2: fl (-) angio · 1 of 1 slices shown (2 of 6)]
[im 1/1]
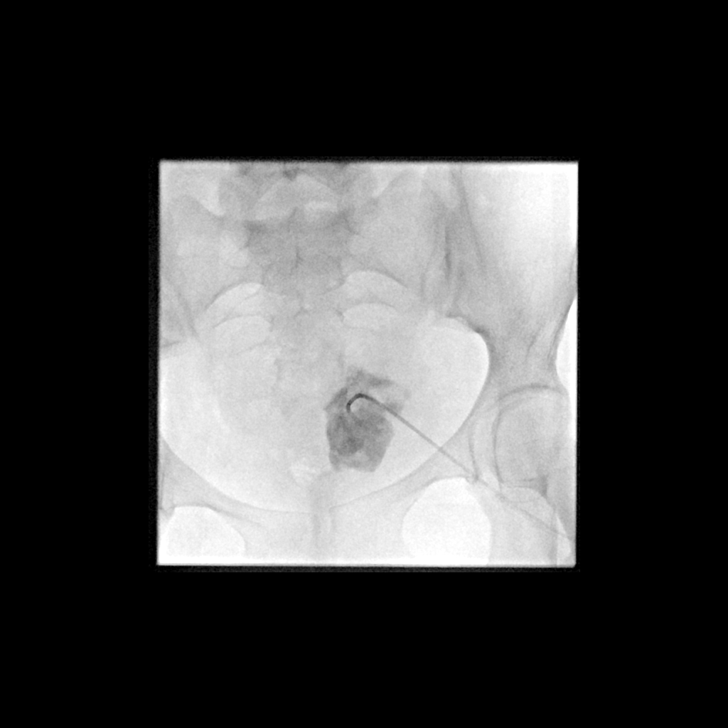

[Series 3: fl (-) angio · 1 of 1 slices shown (3 of 6)]
[im 1/1]
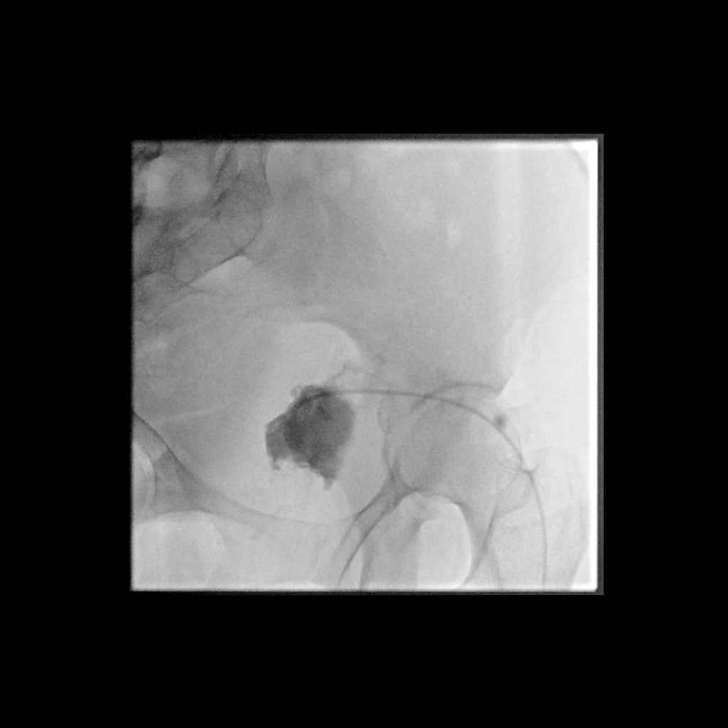

[Series 4: fl (-) angio · 1 of 1 slices shown (4 of 6)]
[im 1/1]
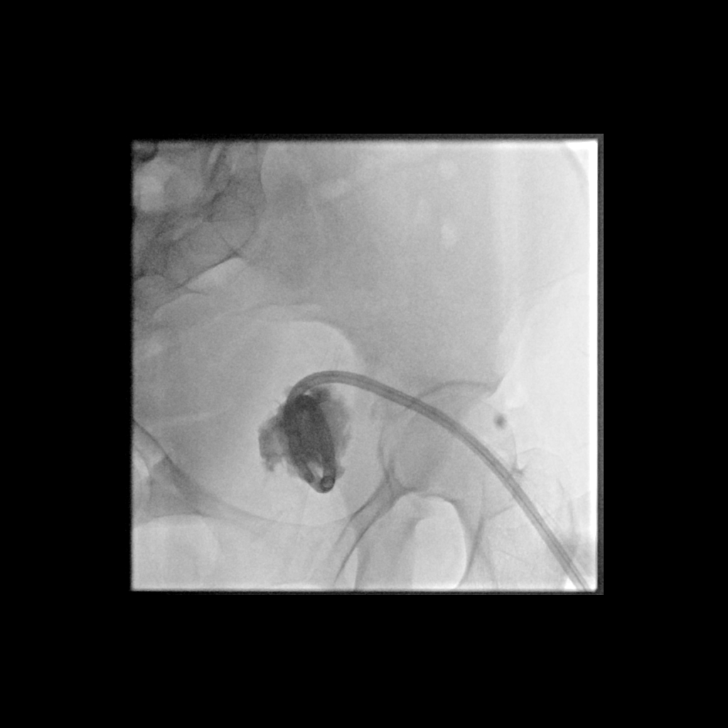

[Series 5: fl (-) angio · 1 of 1 slices shown (5 of 6)]
[im 1/1]
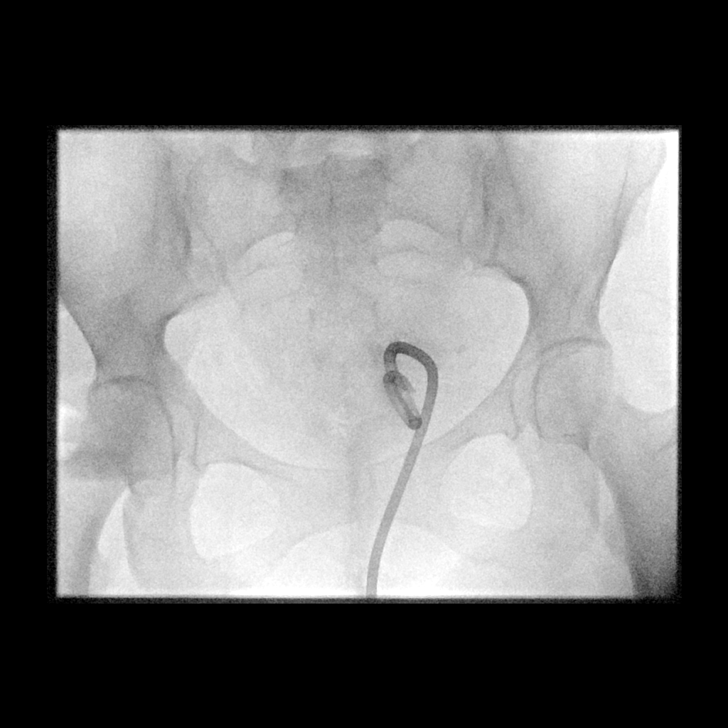

[Series 6: fl (-) angio · 2 of 2 slices shown (6 of 6)]
[im 1/2]
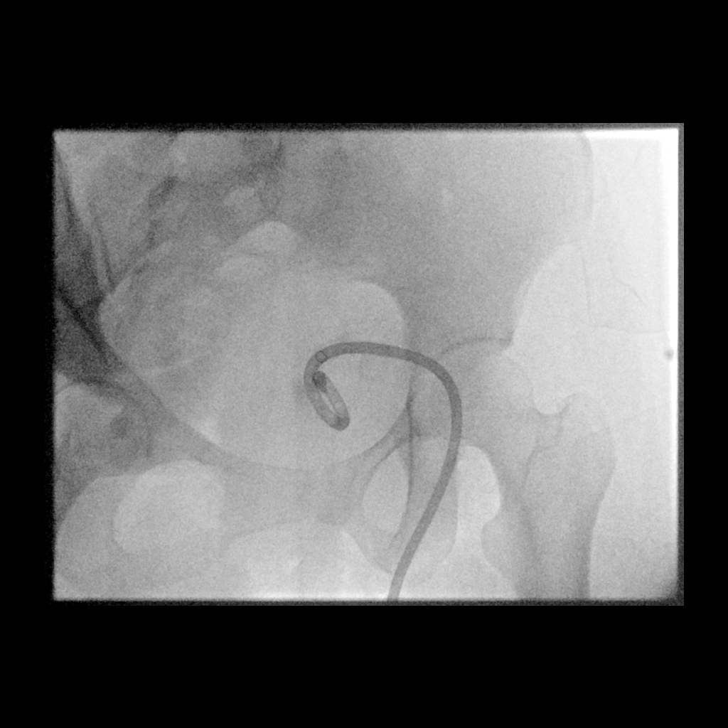
[im 2/2]
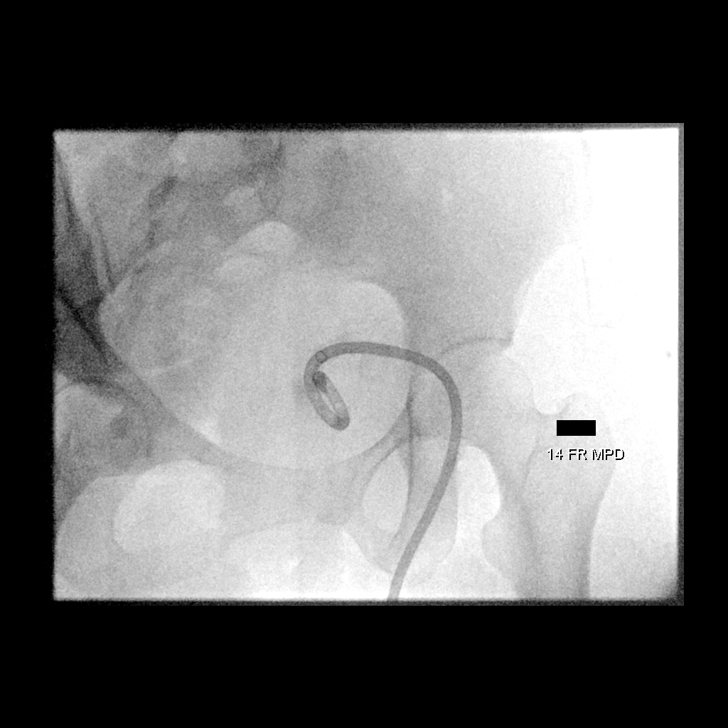

[7 of 7 positions shown; findings below may reference images not displayed]

MEDICATIONS:
The patient was on scheduled IV antibiotics.

CONTRAST:  10 mL Omnipaque 300 via drainage catheter.

ANESTHESIA/SEDATION:
Local anesthetic was administered.

FLUOROSCOPY TIME:  0 minutes 36 seconds (1.4 mGy)

COMPLICATIONS:
None immediate.
Physical examination of the LEFT lower abdominal quadrant
demonstrates a draining sinus tract with purulent drainage. The LEFT
lower abdomen was prepped and draped usual sterile fashion a sterile
drape was applied, covering the operative table. Maximum barrier
sterile technique with sterile gowns and gloves were used for the
procedure. A timeout was performed prior to the initiation of the
procedure. Local anesthesia was provided with 1% lidocaine with
epinephrine.

Under intermittent fluoroscopic guidance, a 5 Fr Kumpe catheter was
slowly advanced through the sinus tract and into the pelvic
collection. Appropriate positioning was confirmed with the efflux of
purulent drainage. Contrast was injected through the Kumpe catheter
confirming adequate positioning. An Amplatz super stiff wire was
advanced through the catheter into the pelvic abscess under
intermittent fluoroscopic guidance. The access site was dilated over
the Amplatz wire, allowing placement of the 14 Fr drainage catheter.

Several postprocedural oblique fluoroscopic images were obtained.
The catheter was secured with an 0-Silk suture. Dressings were
placed. The patient tolerated procedure well without immediate
postprocedural complication.
FINDINGS: After fluoroscopic guided drainage catheter placement, the LEFT
lower quadrant abdominal approach drainage catheter terminates
within the pelvic collection

Following pelvic drainage catheter placement, approximately 15 mL of
purulent fluid was aspirated and submitted to microbiology for
culture.
IMPRESSION: Successful fluoroscopic guided placement of a 14 Fr drain for a LEFT
lower quadrant pelvic abscess, as above.

## 2022-05-06 ENCOUNTER — Telehealth: Payer: Self-pay | Admitting: Physician Assistant

## 2022-05-06 NOTE — Telephone Encounter (Signed)
R/s pt's new hem appt per pt request. Pt is aware of new appt date and time.  

## 2022-05-08 ENCOUNTER — Inpatient Hospital Stay: Payer: Medicaid Other

## 2022-05-08 ENCOUNTER — Inpatient Hospital Stay: Payer: Medicaid Other | Admitting: Physician Assistant

## 2022-05-21 NOTE — Progress Notes (Unsigned)
[facility] [phone]   [phone]  INITIAL CONSULT NOTE  Patient Care Team: [name], NP as PCP - General (Internal Medicine) Brock Bad, MD as Consulting Physician (Obstetrics and Gynecology) Karie Soda, MD as Consulting Physician (General Surgery)  CHIEF COMPLAINTS/PURPOSE OF CONSULTATION:  Bilateral PE and lower extremity DVT.   HISTORY OF PRESENTING ILLNESS:  Maria Riley 31 y.o. female with medical history significant for lower extremity DVT and PE presents for hematologic evaluation and surgical clearance for planned ostomy takedown.  She is accompanied by a Engineer, structural for this visit.  On review of the previous records, Maria Riley underwent emergent C-section in January 2023 in [place]. She had postoperative complications that led to her having right hemicolectomy with ostomy and hysterectomy. She was admitted on 01/22/2022 for pelvic abscess. CT scan of the abdomen/pelvis showed  DVT involving the common and external iliac veins on the left and common femoral vein on the right. CTA chest was obtained on 01/23/2022 that showed bilateral pulmonary emboli.She was treated with IV heparin and transitioned to Eliquis on 01/26/2022.   On exam today, Maria Riley reports that she is doing well without any significant limitations.  She did stop her Eliquis under the guidance of her PCP last month.  While she was on the Eliquis she had no side effects including easy bruising or active bleeding.  Her energy levels are back to baseline and she is able to complete all her daily activities on her own.  She denies any nausea, vomiting or abdominal pain.  Her ostomy is functioning well with good output.  She denies any fevers, chills, night sweats, shortness of breath, chest pain, cough, peripheral edema or neuropathy.  She has no other complaints.  Rest of the 10 point ROS is below.  MEDICAL HISTORY:  Past Medical History:  Diagnosis  Date   Medical history non-contributory     SURGICAL HISTORY: Past Surgical History:  Procedure Laterality Date   CESAREAN SECTION N/A 06/13/2018   Procedure: CESAREAN SECTION;  Surgeon: Inavale Bing, MD;  Location: [facility]ING SU[facility];  Service: Obstetrics;  Laterality: N/A;   IR CATHETER TUBE CHANGE  02/05/2022   IR IMAGE GUIDED DRAINAGE BY PERCUTANEOUS CATHETER  02/18/2022   IR RADIOLOGIST EVAL & MGMT  02/04/2022   IR RADIOLOGIST EVAL & MGMT  02/12/2022   IR RADIOLOGIST EVAL & MGMT  03/11/2022   MOUTH SURGERY     WISDOM TOOTH EXTRACTION Bilateral     SOCIAL HISTORY: Social History   Socioeconomic History   Marital status: Married    Spouse name: Not on file   Number of children: Not on file   Years of education: Not on file   Highest education level: Not on file  Occupational History   Not on file  Tobacco Use   Smoking status: Never   Smokeless tobacco: Never  Vaping Use   Vaping Use: Never used  Substance and Sexual Activity   Alcohol use: No   Drug use: No   Sexual activity: Yes    Birth control/protection: None  Other Topics Concern   Not on file  Social History Narrative   Not on file   Social Determinants of Health   Financial Resource Strain: Not on file  Food Insecurity: Not on file  Transportation Needs: Not on file  Physical Activity: Not on file  Stress: Not on file  Social Connections: Not on file  Intimate Partner Violence: Not on file  FAMILY HISTORY: Family History  Problem Relation Age of Onset   Healthy Father    Alcohol abuse Neg Hx    Arthritis Neg Hx    Asthma Neg Hx    Birth defects Neg Hx    Cancer Neg Hx    COPD Neg Hx    Depression Neg Hx    Diabetes Neg Hx    Drug abuse Neg Hx    Early death Neg Hx    Hearing loss Neg Hx    Heart disease Neg Hx    Hyperlipidemia Neg Hx    Hypertension Neg Hx    Kidney disease Neg Hx    Learning disabilities Neg Hx    Mental illness Neg Hx    Mental retardation Neg Hx    Miscarriages /  Stillbirths Neg Hx    Stroke Neg Hx    Vision loss Neg Hx    Varicose Veins Neg Hx     ALLERGIES:  is allergic to tape.  MEDICATIONS:  Current Outpatient Medications  Medication Sig Dispense Refill   apixaban (ELIQUIS) 5 MG TABS tablet Take 1 tablet (5 mg total) by mouth 2 (two) times daily. 60 tablet 2   pantoprazole (PROTONIX) 40 MG tablet Take 1 tablet (40 mg total) by mouth daily. 30 tablet 0   No current facility-administered medications for this visit.    REVIEW OF SYSTEMS:   Constitutional: ( - ) fevers, ( - )  chills , ( - ) night sweats Eyes: ( - ) blurriness of vision, ( - ) double vision, ( - ) watery eyes Ears, nose, mouth, throat, and face: ( - ) mucositis, ( - ) sore throat Respiratory: ( - ) cough, ( - ) dyspnea, ( - ) wheezes Cardiovascular: ( - ) palpitation, ( - ) chest discomfort, ( - ) lower extremity swelling Gastrointestinal:  ( - ) nausea, ( - ) heartburn, ( - ) change in bowel habits Skin: ( - ) abnormal skin rashes Lymphatics: ( - ) new lymphadenopathy, ( - ) easy bruising Neurological: ( - ) numbness, ( - ) tingling, ( - ) new weaknesses Behavioral/Psych: ( - ) mood change, ( - ) new changes  All other systems were reviewed with the patient and are negative.  PHYSICAL EXAMINATION: ECOG PERFORMANCE STATUS: 0 - Asymptomatic  Vitals:   05/22/22 0911  BP: 101/73  Pulse: 60  Resp: 16  Temp: (!) 97.5 F (36.4 C)  SpO2: 97%   Filed Weights   05/22/22 0911  Weight: 120 lb 11.2 oz (54.7 kg)    GENERAL: well appearing female in NAD  SKIN: skin color, texture, turgor are normal, no rashes or significant lesions EYES: conjunctiva are pink and non-injected, sclera clear OROPHARYNX: no exudate, no erythema; lips, buccal mucosa, and tongue normal  LUNGS: clear to auscultation and percussion with normal breathing effort HEART: regular rate & rhythm and no murmurs and no lower extremity edema Musculoskeletal: no cyanosis of digits and no clubbing  PSYCH:  alert & oriented x 3, fluent speech NEURO: no focal motor/sensory deficits  LABORATORY DATA:  I have reviewed the data as listed    Latest Ref Rng & Units 05/22/2022   11:37 AM 03/25/2022    2:04 PM 02/20/2022    2:35 AM  CBC  WBC 4.0 - 10.5 K/uL 4.5  6.0  9.6   Hemoglobin 12.0 - 15.0 g/dL 13.5  13.7  10.5   Hematocrit 36.0 - 46.0 % 39.0  41.0  32.2   Platelets 150 - 400 K/uL 229  230  323        Latest Ref Rng & Units 05/22/2022   11:37 AM 03/25/2022    2:04 PM 02/20/2022   11:07 AM  CMP  Glucose 70 - 99 mg/dL 71  82  91   BUN 6 - 20 mg/dL 10  12  6    Creatinine 0.44 - 1.00 mg/dL 0.88  1.04  1.52   Sodium 135 - 145 mmol/L 141  135  139   Potassium 3.5 - 5.1 mmol/L 4.3  4.9  3.9   Chloride 98 - 111 mmol/L 110  103  117   CO2 22 - 32 mmol/L 26  24  13    Calcium 8.9 - 10.3 mg/dL 9.8  9.7  8.0   Total Protein 6.5 - 8.1 g/dL 7.7  8.6    Total Bilirubin 0.3 - 1.2 mg/dL 0.5  0.7    Alkaline Phos 38 - 126 U/L 89  111    AST 15 - 41 U/L 89  44    ALT 0 - 44 U/L 144  56      RADIOGRAPHIC STUDIES: I have personally reviewed the radiological images as listed and agreed with the findings in the report. No results found.  ASSESSMENT & PLAN Maria Riley is a 31 y.o. female presents to the clinic for evaluation for recent diagnosis of bilateral PE and bilateral lower extremity DVT diagnosed in February 2023.  Along with the diagnosis of PE and DVT, she was hospitalized for pelvic abscess after recent emergent C-section with postoperative complications.  The likely cause of her venous thromboembolisms is due to surgery and infectious process.  We recommend a total of 6 months of anticoagulation.  Patient discontinued her Eliquis therapy last month as per guidance of her PCP.  Recommend to resume Eliquis 5 mg twice daily for another 3 months to complete a total of 6 months of anticoagulation.  At that time, patient can proceed with ostomy takedown.  Due to history of venous thromboembolisms,  we do recommend 7 to 10 days of postop anticoagulation.   #Bilateral DVT and bilateral PE: --Provoked by emergent c-section with postop complications and pelvic abscess.  --Recommend anticoagulation for 6 months.  --Recommend to resume Eliquis 5 mg twice daily for another 3 months. Sent refill --Okay to proceed with ostomy takedown after completion of anticoagulation which will be around end of August 2023.  --Recommend 7-10 days of anticoagulation (Lovenox 40 mg injection daily) and pneumatic compression following surgery to minimize recurrent VTEs. --Labs today to check CBC and CMP --RTC as needed  Orders Placed This Encounter  Procedures   CBC with Differential ([place] Only)    Standing Status:   Future    Number of Occurrences:   1    Standing Expiration Date:   05/23/2023   CMP ([place] only)    Standing Status:   Future    Number of Occurrences:   1    Standing Expiration Date:   05/23/2023    All questions were answered. The patient knows to call the clinic with any problems, questions or concerns.  I have spent a total of 60 minutes minutes of face-to-face and non-face-to-face time, preparing to see the patient, obtaining and/or reviewing separately obtained history, performing a medically appropriate examination, counseling and educating the patient, ordering medications/tests, documenting clinical information in the electronic health record, and care coordination.   Dede Query, PA-C Department  of Hematology/Oncology [facility] at [facility] Phone: [phone]  Patient was see with Dr. Leonides Schanz  I have read the above note and personally examined the patient. I agree with the assessment and plan as noted above.  Briefly Maria Riley is a 31 year old female who presents for evaluation of DVTs and pulmonary emboli.  Our conversation is assisted by a hospital improved interpreter.  The patient developed these as postsurgical complications  from a procedure resulting in hemicolectomy and hysterectomy following a C-section.  She currently presents for recommendations regarding anticoagulation therapy moving forward and anticoagulation management around the time of surgery.  At this time would recommend that she complete a full 6 months of blood thinner and therefore would recommend restarting at this time.  Additionally would recommend that the ostomy takedown be held until after that time (past August 2023.).  We would recommend approximately 7 to 10 days of Lovenox therapy following the surgery in order to prevent future VTE's.  The patient voiced understanding of the plan moving forward.   Ulysees Barns, MD Department of Hematology/Oncology [facility] at [facility] Phone: [phone] Pager: [phone] Email: [email or web address]

## 2022-05-22 ENCOUNTER — Other Ambulatory Visit: Payer: Self-pay

## 2022-05-22 ENCOUNTER — Inpatient Hospital Stay: Payer: Medicaid Other | Attending: Physician Assistant | Admitting: Physician Assistant

## 2022-05-22 ENCOUNTER — Encounter: Payer: Self-pay | Admitting: Physician Assistant

## 2022-05-22 ENCOUNTER — Telehealth: Payer: Self-pay

## 2022-05-22 ENCOUNTER — Inpatient Hospital Stay: Payer: Medicaid Other

## 2022-05-22 VITALS — BP 101/73 | HR 60 | Temp 97.5°F | Resp 16 | Ht 59.0 in | Wt 120.7 lb

## 2022-05-22 DIAGNOSIS — Z7901 Long term (current) use of anticoagulants: Secondary | ICD-10-CM | POA: Diagnosis not present

## 2022-05-22 DIAGNOSIS — I2699 Other pulmonary embolism without acute cor pulmonale: Secondary | ICD-10-CM | POA: Diagnosis not present

## 2022-05-22 DIAGNOSIS — I82411 Acute embolism and thrombosis of right femoral vein: Secondary | ICD-10-CM | POA: Diagnosis not present

## 2022-05-22 DIAGNOSIS — Z86718 Personal history of other venous thrombosis and embolism: Secondary | ICD-10-CM

## 2022-05-22 DIAGNOSIS — Z86711 Personal history of pulmonary embolism: Secondary | ICD-10-CM | POA: Insufficient documentation

## 2022-05-22 LAB — CBC WITH DIFFERENTIAL (CANCER CENTER ONLY)
Abs Immature Granulocytes: 0 10*3/uL (ref 0.00–0.07)
Basophils Absolute: 0 10*3/uL (ref 0.0–0.1)
Basophils Relative: 0 %
Eosinophils Absolute: 0.1 10*3/uL (ref 0.0–0.5)
Eosinophils Relative: 2 %
HCT: 39 % (ref 36.0–46.0)
Hemoglobin: 13.5 g/dL (ref 12.0–15.0)
Immature Granulocytes: 0 %
Lymphocytes Relative: 56 %
Lymphs Abs: 2.5 10*3/uL (ref 0.7–4.0)
MCH: 28.7 pg (ref 26.0–34.0)
MCHC: 34.6 g/dL (ref 30.0–36.0)
MCV: 82.8 fL (ref 80.0–100.0)
Monocytes Absolute: 0.2 10*3/uL (ref 0.1–1.0)
Monocytes Relative: 5 %
Neutro Abs: 1.6 10*3/uL — ABNORMAL LOW (ref 1.7–7.7)
Neutrophils Relative %: 37 %
Platelet Count: 229 10*3/uL (ref 150–400)
RBC: 4.71 MIL/uL (ref 3.87–5.11)
RDW: 15 % (ref 11.5–15.5)
WBC Count: 4.5 10*3/uL (ref 4.0–10.5)
nRBC: 0 % (ref 0.0–0.2)

## 2022-05-22 LAB — CMP (CANCER CENTER ONLY)
ALT: 144 U/L — ABNORMAL HIGH (ref 0–44)
AST: 89 U/L — ABNORMAL HIGH (ref 15–41)
Albumin: 4.5 g/dL (ref 3.5–5.0)
Alkaline Phosphatase: 89 U/L (ref 38–126)
Anion gap: 5 (ref 5–15)
BUN: 10 mg/dL (ref 6–20)
CO2: 26 mmol/L (ref 22–32)
Calcium: 9.8 mg/dL (ref 8.9–10.3)
Chloride: 110 mmol/L (ref 98–111)
Creatinine: 0.88 mg/dL (ref 0.44–1.00)
GFR, Estimated: >60 mL/min (ref >60)
Glucose, Bld: 71 mg/dL (ref 70–99)
Potassium: 4.3 mmol/L (ref 3.5–5.1)
Sodium: 141 mmol/L (ref 135–145)
Total Bilirubin: 0.5 mg/dL (ref 0.3–1.2)
Total Protein: 7.7 g/dL (ref 6.5–8.1)

## 2022-05-22 MED ORDER — APIXABAN 5 MG PO TABS: 5.0000 mg | ORAL_TABLET | Freq: Two times a day (BID) | ORAL | 2 refills | Status: DC

## 2022-05-22 NOTE — Telephone Encounter (Signed)
Spoke with pt and she had an interpreter, Harrison Mons, with her.  Advised her of lab results and she voiced understanding and would relay to pt.

## 2022-05-22 NOTE — Telephone Encounter (Signed)
-----   Message from Briant Cedar, PA-C sent at 05/22/2022  2:54 PM EDT ----- Please call patient and let her know blood counts look good. Her liver enzymes are elevated. I requested her PCP to follow up on this.

## 2022-05-28 DIAGNOSIS — L659 Nonscarring hair loss, unspecified: Secondary | ICD-10-CM | POA: Diagnosis not present

## 2022-05-28 DIAGNOSIS — R102 Pelvic and perineal pain: Secondary | ICD-10-CM | POA: Diagnosis not present

## 2022-05-28 DIAGNOSIS — R109 Unspecified abdominal pain: Secondary | ICD-10-CM | POA: Diagnosis not present

## 2022-05-28 DIAGNOSIS — Z8619 Personal history of other infectious and parasitic diseases: Secondary | ICD-10-CM | POA: Diagnosis not present

## 2022-05-28 DIAGNOSIS — Z932 Ileostomy status: Secondary | ICD-10-CM | POA: Diagnosis not present

## 2022-05-28 DIAGNOSIS — N898 Other specified noninflammatory disorders of vagina: Secondary | ICD-10-CM | POA: Diagnosis not present

## 2022-05-28 DIAGNOSIS — Z113 Encounter for screening for infections with a predominantly sexual mode of transmission: Secondary | ICD-10-CM | POA: Diagnosis not present

## 2022-05-29 DIAGNOSIS — Z932 Ileostomy status: Secondary | ICD-10-CM | POA: Diagnosis not present

## 2022-06-01 ENCOUNTER — Ambulatory Visit
Admission: RE | Admit: 2022-06-01 | Discharge: 2022-06-01 | Disposition: A | Discharge status: Home / Self Care | Payer: Medicaid Other | Source: Ambulatory Visit | Attending: Obstetrics & Gynecology | Admitting: Obstetrics & Gynecology

## 2022-06-01 ENCOUNTER — Other Ambulatory Visit: Payer: Self-pay | Admitting: Obstetrics & Gynecology

## 2022-06-01 DIAGNOSIS — Z9049 Acquired absence of other specified parts of digestive tract: Secondary | ICD-10-CM | POA: Diagnosis not present

## 2022-06-01 DIAGNOSIS — R102 Pelvic and perineal pain: Secondary | ICD-10-CM | POA: Diagnosis not present

## 2022-06-01 DIAGNOSIS — N3289 Other specified disorders of bladder: Secondary | ICD-10-CM | POA: Diagnosis not present

## 2022-06-01 DIAGNOSIS — K529 Noninfective gastroenteritis and colitis, unspecified: Secondary | ICD-10-CM | POA: Diagnosis not present

## 2022-06-01 MED ORDER — IOPAMIDOL (ISOVUE-300) INJECTION 61%
100.0000 mL | Freq: Once | INTRAVENOUS | Status: AC | PRN
  Administered 2022-06-01: 100 mL via INTRAVENOUS

## 2022-06-02 DIAGNOSIS — R102 Pelvic and perineal pain: Secondary | ICD-10-CM | POA: Diagnosis not present

## 2022-06-04 ENCOUNTER — Encounter (INDEPENDENT_AMBULATORY_CARE_PROVIDER_SITE_OTHER): Payer: Self-pay | Admitting: Primary Care

## 2022-06-04 ENCOUNTER — Ambulatory Visit (INDEPENDENT_AMBULATORY_CARE_PROVIDER_SITE_OTHER): Payer: Medicaid Other | Admitting: Primary Care

## 2022-06-04 VITALS — BP 96/66 | HR 55 | Temp 98.0°F | Ht 59.0 in | Wt 119.8 lb

## 2022-06-04 DIAGNOSIS — L659 Nonscarring hair loss, unspecified: Secondary | ICD-10-CM

## 2022-06-04 NOTE — Progress Notes (Signed)
[facility]  Maria Riley, is a 31 y.o. female  ZOX:[id number]  [id number]:[id number]  DOB - 09-12-1991  Chief Complaint  Patient presents with   hair falling out        Subjective:   Maria Riley is a 31 y.o. Hispanic female ( interpreter Maria Riley [phone] today for a acute visit. She brings in a sandwich bag full of her hair states hairis falling out like this every day. Asked did you tell the oncologist yes- what did they say - nothing. Patient has No headache, No chest pain, No abdominal pain - No Nausea, No new weakness tingling or numbness, No Cough - shortness of breath  Allergies  Allergen Reactions   Tape     Adhesive tape- rash    Past Medical History:  Diagnosis Date   Medical history non-contributory     Current Outpatient Medications on File Prior to Visit  Medication Sig Dispense Refill   apixaban (ELIQUIS) 5 MG TABS tablet Take 1 tablet (5 mg total) by mouth 2 (two) times daily. 60 tablet 2   doxycycline (VIBRA-TABS) 100 MG tablet Take 100 mg by mouth 2 (two) times daily.     pantoprazole (PROTONIX) 40 MG tablet Take 1 tablet (40 mg total) by mouth daily. 30 tablet 0   [DISCONTINUED] fluticasone (FLONASE) 50 MCG/ACT nasal spray Place 1 spray into both nostrils daily. 16 g 0   [DISCONTINUED] LO LOESTRIN FE 1 MG-10 MCG / 10 MCG tablet Take 1 tablet by mouth daily. 1 Package 11   [DISCONTINUED] medroxyPROGESTERone (DEPO-PROVERA) 150 MG/ML injection Inject 1 mL (150 mg total) into the muscle every 3 (three) months. (Patient not taking: Reported on 11/14/2018) 1 mL 4   No current facility-administered medications on file prior to visit.    Objective:   Vitals:   06/04/22 1418  BP: 96/66  Pulse: (!) 55  Temp: 98 F (36.7 C)  TempSrc: Oral  SpO2: 98%  Weight: 119 lb 12.8 oz (54.3 kg)  Height: 4\' 11"  (1.499 m)    Exam General appearance : Awake, alert, not in any distress. Speech Clear. Not toxic looking HEENT: Atraumatic and Normocephalic,  pupils equally reactive to light and accomodation Neck: Supple, no JVD. No cervical lymphadenopathy.  Chest: Good air entry bilaterally, no added sounds  CVS: S1 S2 regular, no murmurs.  Abdomen: Bowel sounds present, Non tender and not distended with no gaurding, rigidity or rebound. Extremities: B/L Lower Ext shows no edema, both legs are warm to touch Neurology: Awake alert, and oriented X 3, Non focal Skin: No Rash  Data Review Lab Results  Component Value Date   HGBA1C 4.8 01/14/2018   HGBA1C 4.7 (L) 03/22/2017    Assessment & Plan   There are no diagnoses linked to this encounter. [name] was seen today for hair falling out .  Diagnoses and all orders for this visit:  Hair loss disorder See HPI    -     TSH + free T4 -     Androstenedione If continues and labs are normal dermatology referral     Patient have been counseled extensively about nutrition and exercise. Other issues discussed during this visit include: low cholesterol diet, weight control and daily exercise, foot care, annual eye examinations at Ophthalmology, importance of adherence with medications and regular follow-up. We also discussed long term complications of uncontrolled diabetes and hypertension.   Return if symptoms worsen or fail to improve.  The patient was given clear instructions to go to ER  or return to medical center if symptoms don't improve, worsen or new problems develop. The patient verbalized understanding. The patient was told to call to get lab results if they haven't heard anything in the next week.   This note has been created with Education officer, environmental. Any transcriptional errors are unintentional.   [name], NP 06/04/2022, 3:04 PM

## 2022-06-05 LAB — SPECIMEN STATUS REPORT

## 2022-06-09 LAB — TSH+FREE T4
Free T4: 0.58 ng/dL — ABNORMAL LOW (ref 0.82–1.77)
TSH: 1.41 u[IU]/mL (ref 0.450–4.500)

## 2022-06-09 LAB — ANDROSTENEDIONE

## 2022-06-16 ENCOUNTER — Other Ambulatory Visit (INDEPENDENT_AMBULATORY_CARE_PROVIDER_SITE_OTHER): Payer: Self-pay | Admitting: Primary Care

## 2022-06-16 MED ORDER — LEVOTHYROXINE SODIUM 25 MCG PO TABS: 25.0000 ug | ORAL_TABLET | Freq: Every day | ORAL | 0 refills | Status: DC

## 2022-06-22 ENCOUNTER — Telehealth (INDEPENDENT_AMBULATORY_CARE_PROVIDER_SITE_OTHER): Payer: Self-pay | Admitting: Primary Care

## 2022-06-22 NOTE — Telephone Encounter (Signed)
Copied from CRM [id number]. Topic: General - Inquiry >> Jun 22, 2022 12:33 PM Marlow Baars wrote: Reason for CRM: Patient not sure why there was a prescription for a steroid written for her. At last appointment she had blood work done but was never called about it. Please assist patient further so she knows what the steroid is for?

## 2022-06-23 DIAGNOSIS — Z932 Ileostomy status: Secondary | ICD-10-CM | POA: Diagnosis not present

## 2022-06-25 ENCOUNTER — Telehealth: Payer: Self-pay

## 2022-06-25 NOTE — Patient Instructions (Signed)
Visit Information  Maria Riley  - as a part of your Medicaid benefit, you are eligible for care management and care coordination services at no cost or copay. I was unable to reach you by phone today but would be happy to help you with your health related needs. Please feel free to call me @ [phone].   A member of the Managed Medicaid care management team will reach out to you again over the next 7 days.   Gus Puma, BSW, [facility]  [place]  High Risk Managed Medicaid Team  [phone]

## 2022-06-25 NOTE — Patient Outreach (Signed)
Care Coordination  06/25/2022  Maria Riley 12-Jul-1991 [id number]   Medicaid Managed Care   Unsuccessful Outreach Note  06/25/2022 Name: Maria Riley MRN: [id number] DOB: Feb 23, 1991  Referred by: [name], NP Reason for referral : High Risk Managed Medicaid (MM Social work telephone outreach)   An unsuccessful telephone outreach was attempted today. The patient was referred to the case management team for assistance with care management and care coordination.   Follow Up Plan: The care management team will reach out to the patient again over the next 7 days.   Gus Puma, BSW, [facility]  [place]  High Risk Managed Medicaid Team  [phone]

## 2022-06-30 ENCOUNTER — Encounter (HOSPITAL_COMMUNITY): Payer: Self-pay | Admitting: Nurse Practitioner

## 2022-07-03 NOTE — Telephone Encounter (Signed)
Patient had questions about why thyroid medication was prescribed. Last labs addressed by a covering provider. CMA routed to PCP. Patient would like labs addressed and receive a call explaining results and need for medication.

## 2022-07-09 NOTE — Telephone Encounter (Signed)
Called patient with interpreter Barrie Folk [phone]  explained that her Free T4 was low and needed medication to normalize levels. Reminded her she brought in a zip lock bag of hair asking why her hair was falling out - this can be from hypothyrodism

## 2022-07-18 ENCOUNTER — Other Ambulatory Visit (INDEPENDENT_AMBULATORY_CARE_PROVIDER_SITE_OTHER): Payer: Self-pay | Admitting: Primary Care

## 2022-07-21 DIAGNOSIS — Z932 Ileostomy status: Secondary | ICD-10-CM | POA: Diagnosis not present

## 2022-08-04 DIAGNOSIS — L650 Telogen effluvium: Secondary | ICD-10-CM | POA: Diagnosis not present

## 2022-08-07 ENCOUNTER — Ambulatory Visit: Payer: Self-pay | Admitting: Surgery

## 2022-08-07 DIAGNOSIS — R739 Hyperglycemia, unspecified: Secondary | ICD-10-CM

## 2022-08-07 NOTE — Progress Notes (Signed)
Sent message, via epic in basket, requesting order in epic from surgeon     08/07/22 1126  Preop Orders  Has preop orders? No  Name of staff/physician contacted for orders(Indicate phone or IB message) S. Gross, MD.

## 2022-08-12 DIAGNOSIS — R051 Acute cough: Secondary | ICD-10-CM | POA: Diagnosis not present

## 2022-08-12 DIAGNOSIS — J029 Acute pharyngitis, unspecified: Secondary | ICD-10-CM | POA: Diagnosis not present

## 2022-08-12 DIAGNOSIS — R300 Dysuria: Secondary | ICD-10-CM | POA: Diagnosis not present

## 2022-08-13 ENCOUNTER — Ambulatory Visit (INDEPENDENT_AMBULATORY_CARE_PROVIDER_SITE_OTHER): Payer: Medicaid Other | Admitting: Primary Care

## 2022-08-13 ENCOUNTER — Encounter (INDEPENDENT_AMBULATORY_CARE_PROVIDER_SITE_OTHER): Payer: Self-pay | Admitting: Primary Care

## 2022-08-13 ENCOUNTER — Other Ambulatory Visit (HOSPITAL_COMMUNITY): Payer: Self-pay

## 2022-08-13 VITALS — BP 117/75 | HR 85 | Temp 99.0°F | Ht 59.0 in | Wt 117.4 lb

## 2022-08-13 DIAGNOSIS — J038 Acute tonsillitis due to other specified organisms: Secondary | ICD-10-CM

## 2022-08-13 MED ORDER — AMOXICILLIN 875 MG PO TABS: 875.0000 mg | ORAL_TABLET | Freq: Two times a day (BID) | ORAL | 0 refills | Status: DC

## 2022-08-13 MED ORDER — FLUCONAZOLE 150 MG PO TABS
150.0000 mg | ORAL_TABLET | Freq: Every day | ORAL | 1 refills | Status: DC
  Filled 2022-08-13: qty 1, 1d supply, fill #0

## 2022-08-13 MED ORDER — FLUCONAZOLE 150 MG PO TABS: 150.0000 mg | ORAL_TABLET | Freq: Every day | ORAL | 1 refills | Status: DC

## 2022-08-13 NOTE — Patient Instructions (Addendum)
DEBIDO AL COVID-19 SLO SE PERMITEN DOS VISITANTES (de 16 aos en adelante)  PARA QUE VENGAN CON USTED Y SE QUEDEN EN LA SALA DE ESPERA SOLAMENTE DURANTE EL PRE OP Y EL PROCEDIMIENTO.   **NO SE PERMITEN VISITAS EN EL REA DE CORTA ESTADA NI EN LA SALA DE RECUPERACIN!!**  SI VA A SER INGRESADO(A) AL HOSPITAL SLO SE LE PERMITEN CUATRO PERSONAS DE APOYO DURANTE LAS HORAS DE VISITA (7 AM -8PM)   La(s) persona(s) de apoyo debe(n) pasar nuestra evaluacin, entrar y salir con gel y Facilities manager en todo momento, incluso en la habitacin del [place]. Los pacientes tambin deben usar una mscara cuando el personal o su persona de apoyo estn en la habitacin. Los visitantes DEBEN LLEVAR ETIQUETA DE VISITANTE DE UNA MANERA VISIBLE. Un visitante adulto Insurance claims handler con usted durante la noche y DEBE estar en la habitacin a las 8 P.M.     Su procedimiento est programado en: 08-19-22   Presntese a la entrada principal del [facility]     Presntese a admisiones por Hotel manager at [employer] AM   Llame a este nmero si tiene [employer] maana de la Kandis Ban al [phone]   Puede tomar liquidos transparentes antes de la cirugia hasta   Despus de la medianoche puede tomar los siguientes lquidos hasta la(s) 10:00 AM DEL DA DE LA CIRUGA  Agua Caf negro (con azcar, SIN [place], NI CREMA)  T normal y descafeinado (con azcar, SIN LECHE, NI CREMA)                              Gelatina normal (NO ROJA)                                           Helados de frutas (sin pulpa. NO DE COLOR ROJO)                                     Helados de hielo (NO ROJO)                                                                  Jugo: de Luna Kitchens, uva De Smet, arndano BLANCO Bebidas deportivas como Gatorade (NO ROJAS)               Tome 2 bebidas de Ensure/G2 A LAS 10:00 PM, la noche antes de la [place].        El da de la ciruga:  Tome [employer] (1) Ensure transparente antes de la ciruga o G2 en  10:00 AM LA MAANA de la ciruga. Tmeselo de un solo. No se lo tome de a sorbitos.  Esta bebida se le dio durante su cita preoperatoria en el hospital. No tome nada ms despus de tomarse todo el Ensure o G2 transparente antes de la ciruga.          Si tiene preguntas, por favor. Pngase en contacto con la oficina de su cirujano.   SIGA LA PREPARACIN INTESTINAL Y CUALQUIER INSTRUCCIN ADICIONAL PREOPERATORIA QUE HAYA RECIBIDO  DEL OFICINA DE DU CIRUJANO!!!  -,Puede tomar liquidos transparentes antes de la cirugia hasta -REPARACIN INTESTINAL - Dulcolax, Neomycin, Miralax, Metonidazole     La higiene bucal tambin es importante para reducir el riesgo de infeccin.                                   Recuerde - LVESE LOS DIENTES EN LA MAANA DE LA CIRUGA CON SU PASTA DENTAL HABITUAL   NO fume despus de la medianoche   [employer] medicamentos en la maana de la ciruga con UN SORBO DE AGUA:  Levothyroxine  NO TOME NINGN MEDICAMENTO ORAL PARA LA DIABETES EL DA DE LA CIRUGA  Traiga la mascarilla CPAP y los tubos el da de la [place].                              No debe trae ningn metal en el cuerpo, incluyendo pinzas para el cabello, joyas, ni aretes/pendientes             No use maquillaje, lociones/cremas, polvos, perfumes o desodorante  No use esmalte de uas, incluyendo los de gel ni S&S, uas artificiales/acrlicas o cualquier otro tipo de cobertura en las uas naturales, incluyendo las uas de las manos y [employer]. Si tiene uas artificiales, con capas de gel, etc. que necesite que le quiten en un saln de uas, por favor, pida que se lo quiten antes de la ciruga o la ciruga podra ser cancelada/retrasada si el cirujano o el anestesilogo consideran que no puede ser monitoreado(a) de una forma segura.   No se rasure en las 48 horas antes de la operacin.      No traiga objetos de valor al hospital. [place] NO SE HACE RESPONSABLE DE LOS OBJETOS DE VALOR.   Los  contactos, las dentaduras o los puentes no se pueden usar durante la [place].   [place] una bolsa pequea para la noche el da de la [place].   NO TRAIGA AL HOSPITAL LOS MEDICAMENTOS QUE TOMA EN CASA . LA FARMACIA LE SUMINISTRAR LOS MEDICAMENTOS QUE TENGA EN SU LISTA DE MEDICAMENTOS DURANTE SU ESTADA EN EL HOSPITAL!     Instrucciones especiales: Dorna Bloom copia de sus documentos de poder notarial y testamento vital el da de su ciruga si no los ha escaneado antes.  Por favor, lea las siguientes hojas informativas que le dieron: SI TIENE PREGUNTAS SOBRE SUS INSTRUCCIONES PREOPERATORIAS POR FAVOR LLAME AL [phone] [place]                           PREPARACIN PARA LA CIRUGA                                            Preparing for Surgery  Debido a que la piel no est esterilizada, sta necesita estar lo ms libre de grmenes como sea posible.  Usted puede reducir el nmero de grmenes en la piel lavndose con el jabn de CHG (Chlorahexidine gluconate) antes de la ciruga.  El CHG es un jabn antisptico el cual mata los grmenes y se une a la piel para continuar matando los grmenes incluso hasta despus de lavarse. POR FAVOR NO LO USE SI USTED TIENE ALERGIAS  AL CHG.  SI LA PIEL SE IRRITA, DEJE DE USAR EL CHG.  NO SE RASURE DURANTE AL MENOS 12 HORAS ANTES DE LA PRIMERA DUCHA CON EL CHG. Siga estas instrucciones cuidadosamente:  Dchese la noche anterior a la [place] y de nuevo en la maana de la [place]. Si decide lavarse el cabello, lvelo con su champ normal primero. Enjuague el cabello y el cuerpo para quitarse el [place]. Use el CHG como lo hara con cualquier otro jabn lquido, usando una toallita o esponja vegetal o exfoliante. Aplique el CHG al cuerpo solamente DEL CUELLO PARA ABAJO.  No lo use cerca de los ojos o los genitales. No se lave con su jabn normal despus de usar el CHG. Squese con una toalla limpia. Espere hasta la maana siguiente para aplicarse desodorantes,  lociones, excepto en el da de la [place], NO SE APLIQUE LOCIONES. Use pijamas limpias o una bata. Coloque sbanas limpias en su cama la noche de su primera ducha - no duerma con mascotas. 10.  Use ropa limpia al venir al hospital.       Espirmetro de incentivo (Vea este video en casa: [email or web address])  Un espirmetro de incentivo es una herramienta que puede ayudarle a Pharmacologist los pulmones limpios y [place]. Esta herramienta mide la capacidad en la que se llenan los pulmones con cada respiracin. Tomar respiraciones largas y profundas puede ayudar a revertir o disminuir la probabilidad de Risk manager respiratorios (pulmonares) (especialmente infecciones) a consecuencia de: Un largo perodo de [employer] no puede moverse o estar activo(a). ANTES DEL PROCEDIMIENTO  Si el espirmetro incluye un indicador para mostrar su mejor esfuerzo, su enfermera o el(la) terapeuta respiratorio(a) lo ajustar a una meta deseada. Si es posible, sintese derecho(a) o ligeramente inclinado(a) hacia adelante. Trate de no encorvarse. Sujete el espirmetro de incentive en posicin erguida. INSTRUCCIONES DE USO  Sintese en el borde de la cama si es posible, o sintese lo mejor que pueda en la cama o en una silla. Sujete el espirmetro de incentivo en posicin recta. Exhale el aire normalmente. Colquese la boquilla en la boca y cierre bien los labios alrededor de ella. Inspire de forma lenta y lo ms profundamente posible, elevando el pistn o la [employer] parte superior del cilindro. Aguante le respiracin durante 3-5 segundos o tanto tiempo como sea posible. Deje que el pistn o la bolita caigan hasta la parte inferior del cilindro. Retire la boquilla de la boca y exhale normalmente. Descanse unos pocos segundos y repita los pasos del 1 al 7, al menos 10 veces cada 1-2 horas cuando est despierto(a). Tmese su tiempo y realice algunas respiraciones normales entre  las respiraciones profundas. El espirmetro puede incluir un indicador para mostrar su mejor esfuerzo. Use el indicador como una meta a alcanzar por  cada respiracin. Despus de cada serie de 10 respiraciones, practique la tos para asegurarse de que sus pulmones estn limpios. Si tiene una incisin (el corte realizado al momento de la ciruga), sujete la incisin al toser colocando una almohada o una toalla enrollada firmemente contra ella. Cuando ya pueda levantarse de la cama, camine dentro de la casa y [place]. Puede dejar de utilizar el espirmetro de incentivo cuando se lo indique el(la) que est a cargo de su cuidado.  RIESGOS Y COMPLICACIONES Tmese su tiempo para no marearse ni sentirse aturdido(a). Si tiene Engineer, mining, es posible que necesite tomar o pedir medicamento para Chief Technology Officer antes de usar el espirmetro de incentivo. Es ms difcil  respirar profundamente si tiene dolor. DESPUS DEL USO Descanse y respire lenta y fcilmente. Puede ser til llevar un registro de su progreso. El(la) que est a cargo de su cuidado puede darle una tabla sencilla para ayudarle con esto. Si est utilizando Landscape architect, siga estas instrucciones: BUSQUE ATENCIN MDICA SI:  Est teniendo dificultad para Publishing copy. Tiene problemas para usar el espirmetro con la frecuencia que se le indic. Su medicamento para Chief Technology Officer no le est aliviando lo suficiente cuando est usando el espirmetro. Tiene fiebre de 100.5 F (38.1 C) o ms. BUSQUE ATENCIN MDICA INMEDIATA SI:  Al toser expulse un esputo con sangre que no tena antes. Tiene fiebre de 102 F (38.9 C) o ms. Le empeora el dolor en el lugar de la incisin o cerca de l. ASEGRESE DE QUE:  Entiende estas instrucciones. Observar su condicin. Buscar ayuda de inmediato si no se siente bien o empeora. Documento publicado: 04/05/2007 Documento revisado: 02/15/2012 Documentp revisado: 06/06/2007 ExitCare Patient Information 2014  [place], [place].       SURGICAL WAITING ROOM VISITATION Patients having surgery or a procedure may have no more than 2 support people in the waiting area - these visitors may rotate.   Children under the age of 33 must have an adult with them who is not the patient. If the patient needs to stay at the hospital during part of their recovery, the visitor guidelines for inpatient rooms apply. Pre-op nurse will coordinate an appropriate time for 1 support person to accompany patient in pre-op.  This support person may not rotate.    Please refer to the [facility] website for the visitor guidelines for Inpatients (after your surgery is over and you are in a regular room).      Your procedure is scheduled on: 08-19-22   Report to [facility] Main Entrance    Report to admitting at 10:45 AM   Call this number if you have problems the morning of surgery [phone]  Follow a clear liquid diet day of surgery   After Midnight you may have the following liquids until 10:00 AM DAY OF SURGERY  Water Non-Citrus Juices (without pulp, NO RED) Carbonated Beverages Black Coffee (NO MILK/CREAM OR CREAMERS, sugar ok)  Clear Tea (NO MILK/CREAM OR CREAMERS, sugar ok) regular and decaf                             Plain Jell-O (NO RED)                                           Fruit ices (not with fruit pulp, NO RED)                                     Popsicles (NO RED)                                                               Sports drinks like Gatorade (NO RED)  Drink 2 Pre-Surgery drinks the evening before surgery (complete by 10 PM)  The day of surgery:  Drink ONE (1) Pre-Surgery Clear Ensure  10:00 AM the morning of surgery. Drink in one sitting. Do not sip.  This drink was given to you during your hospital  pre-op appointment visit. Nothing else to drink after completing the Pre-Surgery Clear Ensure           If you have questions, please contact your surgeon's  office.   FOLLOW BOWEL PREP AND ANY ADDITIONAL PRE OP INSTRUCTIONS YOU RECEIVED FROM YOUR SURGEON'S OFFICE!!!    -clear liquids day before surgery  -Bowel prep - Dulcolax, Neomycin, Miralax, Metronidazole   Oral Hygiene is also important to reduce your risk of infection.                                    Remember - BRUSH YOUR TEETH THE MORNING OF SURGERY WITH YOUR REGULAR TOOTHPASTE   Do NOT smoke after Midnight   Take these medicines the morning of surgery with A SIP OF WATER: Levothyroxine  DO NOT TAKE ANY ORAL DIABETIC MEDICATIONS DAY OF YOUR SURGERY  Bring CPAP mask and tubing day of surgery.                              You may not have any metal on your body including hair pins, jewelry, and body piercing             Do not wear make-up, lotions, powders, perfumes or deodorant  Do not wear nail polish including gel and S&S, artificial/acrylic nails, or any other type of covering on natural nails including finger and toenails. If you have artificial nails, gel coating, etc. that needs to be removed by a nail salon please have this removed prior to surgery or surgery may need to be canceled/ delayed if the surgeon/ anesthesia feels like they are unable to be safely monitored.   Do not shave  48 hours prior to surgery.    Do not bring valuables to the hospital. [place] IS NOT RESPONSIBLE   FOR VALUABLES.   Contacts, dentures or bridgework may not be worn into surgery.   Bring small overnight bag day of surgery.   DO NOT BRING YOUR HOME MEDICATIONS TO THE HOSPITAL. PHARMACY WILL DISPENSE MEDICATIONS LISTED ON YOUR MEDICATION LIST TO YOU DURING YOUR ADMISSION IN THE HOSPITAL!   Special Instructions: Bring a copy of your healthcare power of attorney and living will documents the day of surgery if you haven't scanned them before.  Please read over the following fact sheets you were given: IF YOU HAVE QUESTIONS ABOUT YOUR PRE-OP INSTRUCTIONS PLEASE CALL [phone]  [facility]  [place] - Preparing for Surgery Before surgery, you can play an important role.  Because skin is not sterile, your skin needs to be as free of germs as possible.  You can reduce the number of germs on your skin by washing with CHG (chlorahexidine gluconate) soap before surgery.  CHG is an antiseptic cleaner which kills germs and bonds with the skin to continue killing germs even after washing. Please DO NOT use if you have an allergy to CHG or antibacterial soaps.  If your skin becomes reddened/irritated stop using the CHG and inform your nurse when you arrive at Short Stay. Do not shave (including legs and underarms) for at least 48 hours prior to the first CHG shower.  You may  shave your face/neck.  Please follow these instructions carefully:  1.  Shower with CHG Soap the night before surgery and the  morning of surgery.  2.  If you choose to wash your hair, wash your hair first as usual with your normal  shampoo.  3.  After you shampoo, rinse your hair and body thoroughly to remove the shampoo.                             4.  Use CHG as you would any other liquid soap.  You can apply chg directly to the skin and wash.  Gently with a scrungie or clean washcloth.  5.  Apply the CHG Soap to your body ONLY FROM THE NECK DOWN.   Do   not use on face/ open                           Wound or open sores. Avoid contact with eyes, ears mouth and   genitals (private parts).                       Wash face,  Genitals (private parts) with your normal soap.             6.  Wash thoroughly, paying special attention to the area where your    surgery  will be performed.  7.  Thoroughly rinse your body with warm water from the neck down.  8.  DO NOT shower/wash with your normal soap after using and rinsing off the CHG Soap.                9.  Pat yourself dry with a clean towel.            10.  Wear clean pajamas.            11.  Place clean sheets on your bed the night of your first shower and do not   sleep with pets. Day of Surgery : Do not apply any lotions/deodorants the morning of surgery.  Please wear clean clothes to the hospital/surgery center.  FAILURE TO FOLLOW THESE INSTRUCTIONS MAY RESULT IN THE CANCELLATION OF YOUR SURGERY  PATIENT SIGNATURE_________________________________  NURSE SIGNATURE__________________________________  ________________________________________________________________________     Rogelia Mire  An incentive spirometer is a tool that can help keep your lungs clear and active. This tool measures how well you are filling your lungs with each breath. Taking long deep breaths may help reverse or decrease the chance of developing breathing (pulmonary) problems (especially infection) following: A long period of time when you are unable to move or be active. BEFORE THE PROCEDURE  If the spirometer includes an indicator to show your best effort, your nurse or respiratory therapist will set it to a desired goal. If possible, sit up straight or lean slightly forward. Try not to slouch. Hold the incentive spirometer in an upright position. INSTRUCTIONS FOR USE  Sit on the edge of your bed if possible, or sit up as far as you can in bed or on a chair. Hold the incentive spirometer in an upright position. Breathe out normally. Place the mouthpiece in your mouth and seal your lips tightly around it. Breathe in slowly and as deeply as possible, raising the piston or the ball toward the top of the column. Hold your breath for 3-5 seconds or for as long as possible.  Allow the piston or ball to fall to the bottom of the column. Remove the mouthpiece from your mouth and breathe out normally. Rest for a few seconds and repeat Steps 1 through 7 at least 10 times every 1-2 hours when you are awake. Take your time and take a few normal breaths between deep breaths. The spirometer may include an indicator to show your best effort. Use the indicator as a goal to work  toward during each repetition. After each set of 10 deep breaths, practice coughing to be sure your lungs are clear. If you have an incision (the cut made at the time of surgery), support your incision when coughing by placing a pillow or rolled up towels firmly against it. Once you are able to get out of bed, walk around indoors and cough well. You may stop using the incentive spirometer when instructed by your caregiver.  RISKS AND COMPLICATIONS Take your time so you do not get dizzy or light-headed. If you are in pain, you may need to take or ask for pain medication before doing incentive spirometry. It is harder to take a deep breath if you are having pain. AFTER USE Rest and breathe slowly and easily. It can be helpful to keep track of a log of your progress. Your caregiver can provide you with a simple table to help with this. If you are using the spirometer at home, follow these instructions: SEEK MEDICAL CARE IF:  You are having difficultly using the spirometer. You have trouble using the spirometer as often as instructed. Your pain medication is not giving enough relief while using the spirometer. You develop fever of 100.5 F (38.1 C) or higher. SEEK IMMEDIATE MEDICAL CARE IF:  You cough up bloody sputum that had not been present before. You develop fever of 102 F (38.9 C) or greater. You develop worsening pain at or near the incision site. MAKE SURE YOU:  Understand these instructions. Will watch your condition. Will get help right away if you are not doing well or get worse. Document Released: 04/05/2007 Document Revised: 02/15/2012 Document Reviewed: 06/06/2007 [facility] Patient Information 2014 [place], [place].   ________________________________________________________________________

## 2022-08-13 NOTE — Progress Notes (Addendum)
COVID Vaccine Completed:  Yes  Date of COVID positive in last 90 days:  No  PCP - Gwinda Passe, NP Cardiologist - N/A  Chest x-ray -  N/A EKG - 02-17-22 Epic Stress Test -  N/A ECHO -  N/A Cardiac Cath -  N/A Pacemaker/ICD device last checked: Spinal Cord Stimulator: N/A  Bowel Prep -   Yes per physicians orders, patient does not have prep. Spoke to [place] at Dr. Michaell Cowing' office regarding bowel prep.  She stated that in his note he said no bowel prep, I informed her that in his orders he ordered a prep.  She will double check with Dr. Michaell Cowing and notify the patient.  The patient stated understanding.  Sleep Study -  N/A CPAP -   Fasting Blood Sugar -  N/A Checks Blood Sugar _____ times a day  Blood Thinner Instructions:  Eliquis.  Stop two days before surgery per patient Aspirin Instructions: Last Dose:  Activity level:   Can go up a flight of stairs and perform activities of daily living without stopping and without symptoms of chest pain or shortness of breath.  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  Patient stated that she saw physician yesterday for a sore throat, headache, nasal drainage.  Is being treated for tonsillitis with Amoxicillin.   No Covid test done   Patient denies shortness of breath, cough and chest pain at PAT appointment  Patient verbalized understanding of instructions that were given to them at the PAT appointment. Patient was also instructed that they will need to review over the PAT instructions again at home before surgery.

## 2022-08-14 ENCOUNTER — Other Ambulatory Visit: Payer: Self-pay

## 2022-08-14 ENCOUNTER — Ambulatory Visit: Payer: Self-pay | Admitting: Surgery

## 2022-08-14 ENCOUNTER — Encounter (HOSPITAL_COMMUNITY): Payer: Self-pay

## 2022-08-14 ENCOUNTER — Encounter (HOSPITAL_COMMUNITY)
Admission: RE | Admit: 2022-08-14 | Discharge: 2022-08-14 | Disposition: A | Discharge status: Home / Self Care | Payer: Medicaid Other | Source: Ambulatory Visit | Attending: Surgery | Admitting: Surgery

## 2022-08-14 VITALS — BP 102/78 | HR 96 | Temp 99.2°F | Resp 12 | Ht 59.0 in | Wt 115.8 lb

## 2022-08-14 DIAGNOSIS — R739 Hyperglycemia, unspecified: Secondary | ICD-10-CM | POA: Diagnosis not present

## 2022-08-14 DIAGNOSIS — Z01812 Encounter for preprocedural laboratory examination: Secondary | ICD-10-CM | POA: Insufficient documentation

## 2022-08-14 DIAGNOSIS — Z01818 Encounter for other preprocedural examination: Secondary | ICD-10-CM

## 2022-08-14 HISTORY — DX: Other pulmonary embolism without acute cor pulmonale: I26.99

## 2022-08-14 HISTORY — DX: Anemia, unspecified: D64.9

## 2022-08-14 LAB — CBC
HCT: 41.9 % (ref 36.0–46.0)
Hemoglobin: 13.9 g/dL (ref 12.0–15.0)
MCH: 28.6 pg (ref 26.0–34.0)
MCHC: 33.2 g/dL (ref 30.0–36.0)
MCV: 86.2 fL (ref 80.0–100.0)
Platelets: 218 10*3/uL (ref 150–400)
RBC: 4.86 MIL/uL (ref 3.87–5.11)
RDW: 13.7 % (ref 11.5–15.5)
WBC: 4.1 10*3/uL (ref 4.0–10.5)
nRBC: 0 % (ref 0.0–0.2)

## 2022-08-14 LAB — HEMOGLOBIN A1C
Hgb A1c MFr Bld: 4.8 % (ref 4.8–5.6)
Mean Plasma Glucose: 91.06 mg/dL

## 2022-08-14 NOTE — Progress Notes (Signed)
Spoke to [name] at Dr. Michaell Cowing' office regarding bowel prep.  She stated that in his note he said no bowel prep, I informed her that in his orders he ordered a prep.  She will double check with Dr. Michaell Cowing and notify the patient.  The patient stated understanding.

## 2022-08-16 NOTE — Progress Notes (Signed)
[facility]  Maria Riley, is a 31 y.o. female  GYI:[id number]  JJK:[id number]  DOB - July 10, 1991  Chief Complaint  Patient presents with   Sore Throat    With headache and nasal drainage began yesterday        Subjective:   Maria Riley is a 31 y.o. Hispanic female here today for a  acute visit.  She complains of having a sore throat with headache, nasal drainage and cough which started yesterday.  Patient has No chest pain, No abdominal pain - No Nausea, No new weakness tingling or numbness, - shortness of breath  No problems updated.  Allergies  Allergen Reactions   Tape     Adhesive tape- rash    Past Medical History:  Diagnosis Date   Anemia    Medical history non-contributory    Pulmonary embolism (HCC)     Current Outpatient Medications on File Prior to Visit  Medication Sig Dispense Refill   apixaban (ELIQUIS) 5 MG TABS tablet Take 1 tablet (5 mg total) by mouth 2 (two) times daily. 60 tablet 2   levothyroxine (SYNTHROID) 25 MCG tablet Take 1 tablet (25 mcg total) by mouth daily. 60 tablet 0   pantoprazole (PROTONIX) 40 MG tablet Take 1 tablet (40 mg total) by mouth daily. (Patient not taking: Reported on 08/11/2022) 30 tablet 0   [DISCONTINUED] fluticasone (FLONASE) 50 MCG/ACT nasal spray Place 1 spray into both nostrils daily. 16 g 0   [DISCONTINUED] LO LOESTRIN FE 1 MG-10 MCG / 10 MCG tablet Take 1 tablet by mouth daily. 1 Package 11   [DISCONTINUED] medroxyPROGESTERone (DEPO-PROVERA) 150 MG/ML injection Inject 1 mL (150 mg total) into the muscle every 3 (three) months. (Patient not taking: Reported on 11/14/2018) 1 mL 4   No current facility-administered medications on file prior to visit.    Objective:   Vitals:   08/13/22 1005  BP: 117/75  Pulse: 85  Temp: 99 F (37.2 C)  TempSrc: Oral  SpO2: 98%  Weight: 117 lb 6.4 oz (53.3 kg)  Height: 4\' 11"  (1.499 m)    Exam General appearance : Awake, alert, not in any distress. Speech  Clear. Not toxic looking HEENT: Atraumatic and Normocephalic, pupils equally reactive to light and accomodation, throat bilateral enlarged palpable tonsils throat red and tonsils stones visible Neck: Supple, no JVD. No cervical lymphadenopathy.  Chest: Good air entry bilaterally, no added sounds  CVS: S1 S2 regular, no murmurs.  Abdomen: Bowel sounds present, Non tender and not distended with no gaurding, rigidity or rebound. Extremities: B/L Lower Ext shows no edema, both legs are warm to touch Neurology: Awake alert, and oriented X 3, Non focal Skin: No Rash  Data Review Lab Results  Component Value Date   HGBA1C 4.8 08/14/2022   HGBA1C 4.8 01/14/2018   HGBA1C 4.7 (L) 03/22/2017    Assessment & Plan  [name] was seen today for sore throat.  Diagnoses and all orders for this visit:  Acute tonsillitis due to other specified organisms. Treatment going antibiotics take with food  Other orders -     amoxicillin (AMOXIL) 875 MG tablet; Take 1 tablet (875 mg total) by mouth 2 (two) times daily for 10 days. -     fluconazole (DIFLUCAN) 150 MG tablet; Take 1 tablet (150 mg total) by mouth daily.    Patient have been counseled extensively about nutrition and exercise. Other issues discussed during this visit include: low cholesterol diet, weight control and daily exercise, foot care, annual eye  examinations at Ophthalmology, importance of adherence with medications and regular follow-up. We also discussed long term complications of uncontrolled diabetes and hypertension.   Return if symptoms worsen or fail to improve.  The patient was given clear instructions to go to ER or return to medical center if symptoms don't improve, worsen or new problems develop. The patient verbalized understanding. The patient was told to call to get lab results if they haven't heard anything in the next week.   This note has been created with Education officer, environmental. Any  transcriptional errors are unintentional.   [name], NP 08/16/2022, 9:29 PM

## 2022-08-19 ENCOUNTER — Inpatient Hospital Stay (HOSPITAL_COMMUNITY)
Admission: RE | Admit: 2022-08-19 | Discharge: 2022-08-22 | DRG: 349 | Disposition: A | Discharge status: Home / Self Care | Payer: Medicaid Other | Attending: Surgery | Admitting: Surgery

## 2022-08-19 ENCOUNTER — Inpatient Hospital Stay (HOSPITAL_COMMUNITY): Payer: Medicaid Other | Admitting: Physician Assistant

## 2022-08-19 ENCOUNTER — Inpatient Hospital Stay (HOSPITAL_COMMUNITY): Payer: Medicaid Other | Admitting: Anesthesiology

## 2022-08-19 ENCOUNTER — Other Ambulatory Visit: Payer: Self-pay

## 2022-08-19 ENCOUNTER — Encounter (HOSPITAL_COMMUNITY)
Admission: RE | Disposition: A | Discharge status: Home / Self Care | Payer: Self-pay | Source: Home / Self Care | Attending: Surgery

## 2022-08-19 ENCOUNTER — Encounter (HOSPITAL_COMMUNITY): Payer: Self-pay | Admitting: Surgery

## 2022-08-19 DIAGNOSIS — Z91048 Other nonmedicinal substance allergy status: Secondary | ICD-10-CM

## 2022-08-19 DIAGNOSIS — Z432 Encounter for attention to ileostomy: Principal | ICD-10-CM

## 2022-08-19 DIAGNOSIS — Z9889 Other specified postprocedural states: Principal | ICD-10-CM

## 2022-08-19 DIAGNOSIS — Z7901 Long term (current) use of anticoagulants: Secondary | ICD-10-CM

## 2022-08-19 DIAGNOSIS — Z79899 Other long term (current) drug therapy: Secondary | ICD-10-CM | POA: Diagnosis not present

## 2022-08-19 DIAGNOSIS — Z603 Acculturation difficulty: Secondary | ICD-10-CM | POA: Diagnosis present

## 2022-08-19 DIAGNOSIS — Z86711 Personal history of pulmonary embolism: Secondary | ICD-10-CM

## 2022-08-19 DIAGNOSIS — K9410 Enterostomy complication, unspecified: Secondary | ICD-10-CM | POA: Diagnosis not present

## 2022-08-19 DIAGNOSIS — Z79890 Hormone replacement therapy (postmenopausal): Secondary | ICD-10-CM | POA: Diagnosis not present

## 2022-08-19 DIAGNOSIS — Z86718 Personal history of other venous thrombosis and embolism: Secondary | ICD-10-CM | POA: Diagnosis not present

## 2022-08-19 HISTORY — PX: XI ROBOTIC ASSISTED COLOSTOMY TAKEDOWN: SHX6828

## 2022-08-19 HISTORY — PX: LYSIS OF ADHESION: SHX5961

## 2022-08-19 SURGERY — CLOSURE, COLOSTOMY, ROBOT-ASSISTED
Anesthesia: General | Site: Abdomen

## 2022-08-19 MED ORDER — METOPROLOL TARTRATE 5 MG/5ML IV SOLN: 5.0000 mg | Freq: Four times a day (QID) | INTRAVENOUS | Status: DC | PRN

## 2022-08-19 MED ORDER — DEXAMETHASONE SODIUM PHOSPHATE 10 MG/ML IJ SOLN
INTRAMUSCULAR | Status: DC | PRN
  Administered 2022-08-19: 6 mg via INTRAVENOUS

## 2022-08-19 MED ORDER — SODIUM CHLORIDE 0.9 % IV SOLN: 250.0000 mL | INTRAVENOUS | Status: DC | PRN

## 2022-08-19 MED ORDER — PROPOFOL 10 MG/ML IV BOLUS
INTRAVENOUS | Status: AC
  Filled 2022-08-19: qty 20

## 2022-08-19 MED ORDER — SODIUM CHLORIDE 0.9% FLUSH
3.0000 mL | Freq: Two times a day (BID) | INTRAVENOUS | Status: DC
  Administered 2022-08-19 – 2022-08-22 (×6): 3 mL via INTRAVENOUS

## 2022-08-19 MED ORDER — ENSURE PRE-SURGERY PO LIQD: 592.0000 mL | Freq: Once | ORAL | Status: DC

## 2022-08-19 MED ORDER — DIPHENHYDRAMINE HCL 12.5 MG/5ML PO ELIX: 12.5000 mg | ORAL_SOLUTION | Freq: Four times a day (QID) | ORAL | Status: DC | PRN

## 2022-08-19 MED ORDER — PROPOFOL 10 MG/ML IV BOLUS
INTRAVENOUS | Status: DC | PRN
  Administered 2022-08-19: 120 mg via INTRAVENOUS

## 2022-08-19 MED ORDER — SUGAMMADEX SODIUM 200 MG/2ML IV SOLN
INTRAVENOUS | Status: DC | PRN
  Administered 2022-08-19: 200 mg via INTRAVENOUS

## 2022-08-19 MED ORDER — ALVIMOPAN 12 MG PO CAPS
12.0000 mg | ORAL_CAPSULE | Freq: Two times a day (BID) | ORAL | Status: DC
  Administered 2022-08-20 (×2): 12 mg via ORAL
  Filled 2022-08-19 (×2): qty 1

## 2022-08-19 MED ORDER — PROCHLORPERAZINE EDISYLATE 10 MG/2ML IJ SOLN
5.0000 mg | Freq: Four times a day (QID) | INTRAMUSCULAR | Status: DC | PRN
  Filled 2022-08-19: qty 2

## 2022-08-19 MED ORDER — ENSURE SURGERY PO LIQD
237.0000 mL | Freq: Two times a day (BID) | ORAL | Status: DC
  Administered 2022-08-20 (×2): 237 mL via ORAL

## 2022-08-19 MED ORDER — ORAL CARE MOUTH RINSE: 15.0000 mL | Freq: Once | OROMUCOSAL | Status: AC

## 2022-08-19 MED ORDER — LIDOCAINE HCL (PF) 2 % IJ SOLN
INTRAMUSCULAR | Status: DC | PRN
  Administered 2022-08-19: 1.5 mg/kg/h via INTRADERMAL

## 2022-08-19 MED ORDER — CHLORHEXIDINE GLUCONATE 0.12 % MT SOLN
15.0000 mL | Freq: Once | OROMUCOSAL | Status: AC
  Administered 2022-08-19: 15 mL via OROMUCOSAL

## 2022-08-19 MED ORDER — LIP MEDEX EX OINT
TOPICAL_OINTMENT | Freq: Two times a day (BID) | CUTANEOUS | Status: DC
  Administered 2022-08-19 – 2022-08-22 (×4): 75 via TOPICAL
  Filled 2022-08-19: qty 7

## 2022-08-19 MED ORDER — ROCURONIUM BROMIDE 10 MG/ML (PF) SYRINGE
PREFILLED_SYRINGE | INTRAVENOUS | Status: AC
  Filled 2022-08-19: qty 10

## 2022-08-19 MED ORDER — KETAMINE HCL 10 MG/ML IJ SOLN
INTRAMUSCULAR | Status: DC | PRN
  Administered 2022-08-19 (×2): 20 mg via INTRAVENOUS

## 2022-08-19 MED ORDER — ALUM & MAG HYDROXIDE-SIMETH 200-200-20 MG/5ML PO SUSP: 30.0000 mL | Freq: Four times a day (QID) | ORAL | Status: DC | PRN

## 2022-08-19 MED ORDER — 0.9 % SODIUM CHLORIDE (POUR BTL) OPTIME
TOPICAL | Status: DC | PRN
  Administered 2022-08-19: 2000 mL

## 2022-08-19 MED ORDER — ACETAMINOPHEN 500 MG PO TABS
1000.0000 mg | ORAL_TABLET | ORAL | Status: AC
  Administered 2022-08-19: 1000 mg via ORAL
  Filled 2022-08-19: qty 2

## 2022-08-19 MED ORDER — PHENYLEPHRINE HCL-NACL 20-0.9 MG/250ML-% IV SOLN
INTRAVENOUS | Status: DC | PRN
  Administered 2022-08-19: 40 ug/min via INTRAVENOUS

## 2022-08-19 MED ORDER — BUPIVACAINE LIPOSOME 1.3 % IJ SUSP
INTRAMUSCULAR | Status: DC | PRN
  Administered 2022-08-19: 20 mL

## 2022-08-19 MED ORDER — LIDOCAINE HCL (PF) 2 % IJ SOLN
INTRAMUSCULAR | Status: AC
  Filled 2022-08-19: qty 5

## 2022-08-19 MED ORDER — SODIUM CHLORIDE 0.9 % IV SOLN
2.0000 g | INTRAVENOUS | Status: AC
  Administered 2022-08-19: 2 g via INTRAVENOUS
  Filled 2022-08-19: qty 2

## 2022-08-19 MED ORDER — MIDAZOLAM HCL 2 MG/2ML IJ SOLN
INTRAMUSCULAR | Status: AC
  Filled 2022-08-19: qty 2

## 2022-08-19 MED ORDER — LACTATED RINGERS IV BOLUS: 1000.0000 mL | Freq: Three times a day (TID) | INTRAVENOUS | Status: DC | PRN

## 2022-08-19 MED ORDER — MAGIC MOUTHWASH: 15.0000 mL | Freq: Four times a day (QID) | ORAL | Status: DC | PRN

## 2022-08-19 MED ORDER — MELATONIN 3 MG PO TABS: 3.0000 mg | ORAL_TABLET | Freq: Every evening | ORAL | Status: DC | PRN

## 2022-08-19 MED ORDER — ALVIMOPAN 12 MG PO CAPS
12.0000 mg | ORAL_CAPSULE | ORAL | Status: AC
  Administered 2022-08-19: 12 mg via ORAL
  Filled 2022-08-19: qty 1

## 2022-08-19 MED ORDER — HYDROMORPHONE HCL 1 MG/ML IJ SOLN
0.5000 mg | INTRAMUSCULAR | Status: DC | PRN
  Administered 2022-08-19: 1 mg via INTRAVENOUS
  Filled 2022-08-19: qty 1

## 2022-08-19 MED ORDER — ENSURE PRE-SURGERY PO LIQD: 296.0000 mL | Freq: Once | ORAL | Status: DC

## 2022-08-19 MED ORDER — ACETAMINOPHEN 500 MG PO TABS
1000.0000 mg | ORAL_TABLET | Freq: Four times a day (QID) | ORAL | Status: DC
  Administered 2022-08-19 – 2022-08-22 (×8): 1000 mg via ORAL
  Filled 2022-08-19 (×8): qty 2

## 2022-08-19 MED ORDER — CHLORHEXIDINE GLUCONATE CLOTH 2 % EX PADS: 6.0000 | MEDICATED_PAD | Freq: Once | CUTANEOUS | Status: DC

## 2022-08-19 MED ORDER — ONDANSETRON HCL 4 MG/2ML IJ SOLN
4.0000 mg | Freq: Four times a day (QID) | INTRAMUSCULAR | Status: DC | PRN
  Filled 2022-08-19 (×2): qty 2

## 2022-08-19 MED ORDER — SODIUM CHLORIDE 0.9 % IV SOLN: Freq: Three times a day (TID) | INTRAVENOUS | Status: DC | PRN

## 2022-08-19 MED ORDER — BISACODYL 5 MG PO TBEC: 20.0000 mg | DELAYED_RELEASE_TABLET | Freq: Once | ORAL | Status: DC

## 2022-08-19 MED ORDER — ENOXAPARIN SODIUM 40 MG/0.4ML IJ SOSY
40.0000 mg | PREFILLED_SYRINGE | Freq: Once | INTRAMUSCULAR | Status: AC
  Administered 2022-08-19: 40 mg via SUBCUTANEOUS
  Filled 2022-08-19: qty 0.4

## 2022-08-19 MED ORDER — METHOCARBAMOL 1000 MG/10ML IJ SOLN
1000.0000 mg | Freq: Four times a day (QID) | INTRAVENOUS | Status: DC | PRN
  Filled 2022-08-19: qty 10

## 2022-08-19 MED ORDER — LACTATED RINGERS IV SOLN: INTRAVENOUS | Status: DC

## 2022-08-19 MED ORDER — FENTANYL CITRATE (PF) 250 MCG/5ML IJ SOLN
INTRAMUSCULAR | Status: AC
  Filled 2022-08-19: qty 5

## 2022-08-19 MED ORDER — KETAMINE HCL 10 MG/ML IJ SOLN
INTRAMUSCULAR | Status: AC
  Filled 2022-08-19: qty 1

## 2022-08-19 MED ORDER — ONDANSETRON HCL 4 MG/2ML IJ SOLN
INTRAMUSCULAR | Status: DC | PRN
  Administered 2022-08-19: 4 mg via INTRAVENOUS

## 2022-08-19 MED ORDER — ENOXAPARIN SODIUM 40 MG/0.4ML IJ SOSY
40.0000 mg | PREFILLED_SYRINGE | INTRAMUSCULAR | Status: DC
  Administered 2022-08-20 – 2022-08-22 (×3): 40 mg via SUBCUTANEOUS
  Filled 2022-08-19 (×3): qty 0.4

## 2022-08-19 MED ORDER — ONDANSETRON HCL 4 MG/2ML IJ SOLN
INTRAMUSCULAR | Status: AC
  Filled 2022-08-19: qty 2

## 2022-08-19 MED ORDER — BUPIVACAINE LIPOSOME 1.3 % IJ SUSP: 20.0000 mL | Freq: Once | INTRAMUSCULAR | Status: DC

## 2022-08-19 MED ORDER — DEXAMETHASONE SODIUM PHOSPHATE 10 MG/ML IJ SOLN
INTRAMUSCULAR | Status: AC
  Filled 2022-08-19: qty 1

## 2022-08-19 MED ORDER — EPHEDRINE SULFATE-NACL 50-0.9 MG/10ML-% IV SOSY
PREFILLED_SYRINGE | INTRAVENOUS | Status: DC | PRN
  Administered 2022-08-19: 7.5 mg via INTRAVENOUS

## 2022-08-19 MED ORDER — BUPIVACAINE LIPOSOME 1.3 % IJ SUSP
INTRAMUSCULAR | Status: AC
  Filled 2022-08-19: qty 20

## 2022-08-19 MED ORDER — TRAMADOL HCL 50 MG PO TABS
50.0000 mg | ORAL_TABLET | Freq: Four times a day (QID) | ORAL | Status: DC | PRN
  Administered 2022-08-20 – 2022-08-22 (×3): 100 mg via ORAL
  Filled 2022-08-19 (×3): qty 2

## 2022-08-19 MED ORDER — LEVOTHYROXINE SODIUM 25 MCG PO TABS
25.0000 ug | ORAL_TABLET | Freq: Every day | ORAL | Status: DC
  Administered 2022-08-20 – 2022-08-22 (×3): 25 ug via ORAL
  Filled 2022-08-19 (×3): qty 1

## 2022-08-19 MED ORDER — PHENYLEPHRINE HCL (PRESSORS) 10 MG/ML IV SOLN
INTRAVENOUS | Status: AC
  Filled 2022-08-19: qty 1

## 2022-08-19 MED ORDER — PROCHLORPERAZINE MALEATE 10 MG PO TABS
10.0000 mg | ORAL_TABLET | Freq: Four times a day (QID) | ORAL | Status: DC | PRN
  Administered 2022-08-20: 10 mg via ORAL
  Filled 2022-08-19: qty 1

## 2022-08-19 MED ORDER — SODIUM CHLORIDE 0.9% FLUSH: 3.0000 mL | INTRAVENOUS | Status: DC | PRN

## 2022-08-19 MED ORDER — ONDANSETRON HCL 4 MG PO TABS
4.0000 mg | ORAL_TABLET | Freq: Four times a day (QID) | ORAL | Status: DC | PRN
  Administered 2022-08-20: 4 mg via ORAL
  Filled 2022-08-19 (×2): qty 1

## 2022-08-19 MED ORDER — PROMETHAZINE HCL 25 MG/ML IJ SOLN: 6.2500 mg | INTRAMUSCULAR | Status: DC | PRN

## 2022-08-19 MED ORDER — CELECOXIB 200 MG PO CAPS
200.0000 mg | ORAL_CAPSULE | ORAL | Status: AC
  Administered 2022-08-19: 200 mg via ORAL
  Filled 2022-08-19: qty 1

## 2022-08-19 MED ORDER — SIMETHICONE 80 MG PO CHEW: 40.0000 mg | CHEWABLE_TABLET | Freq: Four times a day (QID) | ORAL | Status: DC | PRN

## 2022-08-19 MED ORDER — BUPIVACAINE-EPINEPHRINE (PF) 0.25% -1:200000 IJ SOLN
INTRAMUSCULAR | Status: AC
  Filled 2022-08-19: qty 30

## 2022-08-19 MED ORDER — STERILE WATER FOR IRRIGATION IR SOLN
Status: DC | PRN
  Administered 2022-08-19: 1000 mL

## 2022-08-19 MED ORDER — AMISULPRIDE (ANTIEMETIC) 5 MG/2ML IV SOLN: 10.0000 mg | Freq: Once | INTRAVENOUS | Status: DC | PRN

## 2022-08-19 MED ORDER — ROCURONIUM BROMIDE 10 MG/ML (PF) SYRINGE
PREFILLED_SYRINGE | INTRAVENOUS | Status: DC | PRN
  Administered 2022-08-19: 30 mg via INTRAVENOUS
  Administered 2022-08-19: 70 mg via INTRAVENOUS
  Administered 2022-08-19: 20 mg via INTRAVENOUS

## 2022-08-19 MED ORDER — LIDOCAINE 2% (20 MG/ML) 5 ML SYRINGE
INTRAMUSCULAR | Status: DC | PRN
  Administered 2022-08-19: 60 mg via INTRAVENOUS

## 2022-08-19 MED ORDER — EPHEDRINE 5 MG/ML INJ
INTRAVENOUS | Status: AC
  Filled 2022-08-19: qty 5

## 2022-08-19 MED ORDER — FENTANYL CITRATE PF 50 MCG/ML IJ SOSY
PREFILLED_SYRINGE | INTRAMUSCULAR | Status: AC
  Filled 2022-08-19: qty 1

## 2022-08-19 MED ORDER — BUPIVACAINE-EPINEPHRINE (PF) 0.25% -1:200000 IJ SOLN
INTRAMUSCULAR | Status: DC | PRN
  Administered 2022-08-19: 30 mL

## 2022-08-19 MED ORDER — FENTANYL CITRATE (PF) 100 MCG/2ML IJ SOLN
INTRAMUSCULAR | Status: DC | PRN
  Administered 2022-08-19 (×3): 50 ug via INTRAVENOUS

## 2022-08-19 MED ORDER — CALCIUM POLYCARBOPHIL 625 MG PO TABS
625.0000 mg | ORAL_TABLET | Freq: Two times a day (BID) | ORAL | Status: DC
  Administered 2022-08-19 – 2022-08-22 (×6): 625 mg via ORAL
  Filled 2022-08-19 (×6): qty 1

## 2022-08-19 MED ORDER — MIDAZOLAM HCL 5 MG/5ML IJ SOLN
INTRAMUSCULAR | Status: DC | PRN
  Administered 2022-08-19: 2 mg via INTRAVENOUS

## 2022-08-19 MED ORDER — METHOCARBAMOL 500 MG PO TABS: 1000.0000 mg | ORAL_TABLET | Freq: Four times a day (QID) | ORAL | Status: DC | PRN

## 2022-08-19 MED ORDER — DIPHENHYDRAMINE HCL 50 MG/ML IJ SOLN: 12.5000 mg | Freq: Four times a day (QID) | INTRAMUSCULAR | Status: DC | PRN

## 2022-08-19 MED ORDER — SODIUM CHLORIDE 0.9 % IV SOLN
2.0000 g | Freq: Two times a day (BID) | INTRAVENOUS | Status: AC
  Administered 2022-08-19: 2 g via INTRAVENOUS
  Filled 2022-08-19: qty 2

## 2022-08-19 MED ORDER — ENALAPRILAT 1.25 MG/ML IV SOLN: 0.6250 mg | Freq: Four times a day (QID) | INTRAVENOUS | Status: DC | PRN

## 2022-08-19 MED ORDER — HYDRALAZINE HCL 20 MG/ML IJ SOLN: 10.0000 mg | INTRAMUSCULAR | Status: DC | PRN

## 2022-08-19 MED ORDER — GABAPENTIN 300 MG PO CAPS
300.0000 mg | ORAL_CAPSULE | ORAL | Status: AC
  Administered 2022-08-19: 300 mg via ORAL
  Filled 2022-08-19: qty 1

## 2022-08-19 MED ORDER — FENTANYL CITRATE PF 50 MCG/ML IJ SOSY
25.0000 ug | PREFILLED_SYRINGE | INTRAMUSCULAR | Status: DC | PRN
  Administered 2022-08-19: 50 ug via INTRAVENOUS

## 2022-08-19 SURGICAL SUPPLY — 121 items
APPLIER CLIP 5 13 M/L LIGAMAX5 (MISCELLANEOUS)
APPLIER CLIP ROT 10 11.4 M/L (STAPLE)
BAG COUNTER SPONGE SURGICOUNT (BAG) ×2 IMPLANT
BLADE EXTENDED COATED 6.5IN (ELECTRODE) IMPLANT
CANNULA REDUC XI 12-8 STAPL (CANNULA) ×2
CANNULA REDUCER 12-8 DVNC XI (CANNULA) ×1 IMPLANT
CELLS DAT CNTRL 66122 CELL SVR (MISCELLANEOUS) IMPLANT
CHLORAPREP W/TINT 26 (MISCELLANEOUS) IMPLANT
CLIP APPLIE 5 13 M/L LIGAMAX5 (MISCELLANEOUS) IMPLANT
CLIP APPLIE ROT 10 11.4 M/L (STAPLE) IMPLANT
COVER SURGICAL LIGHT HANDLE (MISCELLANEOUS) ×4 IMPLANT
COVER TIP SHEARS 8 DVNC (MISCELLANEOUS) ×2 IMPLANT
COVER TIP SHEARS 8MM DA VINCI (MISCELLANEOUS) ×2
DEVICE TROCAR PUNCTURE CLOSURE (ENDOMECHANICALS) IMPLANT
DRAIN CHANNEL 19F RND (DRAIN) IMPLANT
DRAPE ARM DVNC X/XI (DISPOSABLE) ×8 IMPLANT
DRAPE COLUMN DVNC XI (DISPOSABLE) ×2 IMPLANT
DRAPE DA VINCI XI ARM (DISPOSABLE) ×8
DRAPE DA VINCI XI COLUMN (DISPOSABLE) ×2
DRAPE SURG IRRIG POUCH 19X23 (DRAPES) ×2 IMPLANT
DRSG OPSITE POSTOP 4X10 (GAUZE/BANDAGES/DRESSINGS) IMPLANT
DRSG OPSITE POSTOP 4X6 (GAUZE/BANDAGES/DRESSINGS) IMPLANT
DRSG OPSITE POSTOP 4X8 (GAUZE/BANDAGES/DRESSINGS) IMPLANT
DRSG TEGADERM 2-3/8X2-3/4 SM (GAUZE/BANDAGES/DRESSINGS) ×10 IMPLANT
DRSG TEGADERM 4X4.75 (GAUZE/BANDAGES/DRESSINGS) IMPLANT
ELECT PENCIL ROCKER SW 15FT (MISCELLANEOUS) ×2 IMPLANT
ELECT REM PT RETURN 15FT ADLT (MISCELLANEOUS) ×2 IMPLANT
ENDOLOOP SUT PDS II  0 18 (SUTURE)
ENDOLOOP SUT PDS II 0 18 (SUTURE) IMPLANT
EVACUATOR SILICONE 100CC (DRAIN) IMPLANT
GAUZE SPONGE 2X2 8PLY STRL LF (GAUZE/BANDAGES/DRESSINGS) ×2 IMPLANT
GLOVE ECLIPSE 8.0 STRL XLNG CF (GLOVE) ×6 IMPLANT
GLOVE INDICATOR 8.0 STRL GRN (GLOVE) ×6 IMPLANT
GOWN SRG XL LVL 4 BRTHBL STRL (GOWNS) ×2 IMPLANT
GOWN STRL NON-REIN XL LVL4 (GOWNS) ×2
GOWN STRL REUS W/ TWL XL LVL3 (GOWN DISPOSABLE) ×8 IMPLANT
GOWN STRL REUS W/TWL XL LVL3 (GOWN DISPOSABLE) ×8
GRASPER SUT TROCAR 14GX15 (MISCELLANEOUS) IMPLANT
HOLDER FOLEY CATH W/STRAP (MISCELLANEOUS) ×2 IMPLANT
IRRIG SUCT STRYKERFLOW 2 WTIP (MISCELLANEOUS) ×2
IRRIGATION SUCT STRKRFLW 2 WTP (MISCELLANEOUS) ×2 IMPLANT
KIT PROCEDURE DA VINCI SI (MISCELLANEOUS)
KIT PROCEDURE DVNC SI (MISCELLANEOUS) IMPLANT
KIT SIGMOIDOSCOPE (SET/KITS/TRAYS/PACK) IMPLANT
KIT TURNOVER KIT A (KITS) IMPLANT
NDL INSUFFLATION 14GA 120MM (NEEDLE) ×1 IMPLANT
NEEDLE INSUFFLATION 14GA 120MM (NEEDLE) ×2 IMPLANT
PACK CARDIOVASCULAR III (CUSTOM PROCEDURE TRAY) ×2 IMPLANT
PACK COLON (CUSTOM PROCEDURE TRAY) ×2 IMPLANT
PAD POSITIONING PINK XL (MISCELLANEOUS) ×2 IMPLANT
PROTECTOR NERVE ULNAR (MISCELLANEOUS) ×4 IMPLANT
RELOAD STAPLE 45 3.5 BLU DVNC (STAPLE) IMPLANT
RELOAD STAPLE 45 4.3 GRN DVNC (STAPLE) IMPLANT
RELOAD STAPLE 60 2.5 WHT DVNC (STAPLE) IMPLANT
RELOAD STAPLE 60 3.5 BLU DVNC (STAPLE) IMPLANT
RELOAD STAPLE 60 4.3 GRN DVNC (STAPLE) IMPLANT
RELOAD STAPLER 2.5X60 WHT DVNC (STAPLE) ×2 IMPLANT
RELOAD STAPLER 3.5X45 BLU DVNC (STAPLE) IMPLANT
RELOAD STAPLER 3.5X60 BLU DVNC (STAPLE) ×2 IMPLANT
RELOAD STAPLER 4.3X45 GRN DVNC (STAPLE) IMPLANT
RELOAD STAPLER 4.3X60 GRN DVNC (STAPLE) IMPLANT
RETRACTOR WND ALEXIS 18 MED (MISCELLANEOUS) IMPLANT
RTRCTR WOUND ALEXIS 18CM MED (MISCELLANEOUS)
SCISSORS LAP 5X35 DISP (ENDOMECHANICALS) ×2 IMPLANT
SCRUB CHG 4% DYNA-HEX 4OZ (MISCELLANEOUS) ×1 IMPLANT
SEAL CANN UNIV 5-8 DVNC XI (MISCELLANEOUS) ×6 IMPLANT
SEAL XI 5MM-8MM UNIVERSAL (MISCELLANEOUS) ×6
SEALER VESSEL DA VINCI XI (MISCELLANEOUS) ×2
SEALER VESSEL EXT DVNC XI (MISCELLANEOUS) ×2 IMPLANT
SOLUTION ELECTROLUBE (MISCELLANEOUS) ×2 IMPLANT
SPIKE FLUID TRANSFER (MISCELLANEOUS) ×2 IMPLANT
STAPLER 45 DA VINCI SURE FORM (STAPLE)
STAPLER 45 SUREFORM DVNC (STAPLE) IMPLANT
STAPLER 60 DA VINCI SURE FORM (STAPLE) ×2
STAPLER 60 SUREFORM DVNC (STAPLE) ×1 IMPLANT
STAPLER CANNULA SEAL DVNC XI (STAPLE) ×2 IMPLANT
STAPLER CANNULA SEAL XI (STAPLE) ×2
STAPLER ECHELON POWER CIR 29 (STAPLE) IMPLANT
STAPLER ECHELON POWER CIR 31 (STAPLE) IMPLANT
STAPLER RELOAD 2.5X60 WHITE (STAPLE) ×2
STAPLER RELOAD 2.5X60 WHT DVNC (STAPLE) ×2
STAPLER RELOAD 3.5X45 BLU DVNC (STAPLE)
STAPLER RELOAD 3.5X45 BLUE (STAPLE)
STAPLER RELOAD 3.5X60 BLU DVNC (STAPLE) ×2
STAPLER RELOAD 3.5X60 BLUE (STAPLE) ×2
STAPLER RELOAD 4.3X45 GREEN (STAPLE)
STAPLER RELOAD 4.3X45 GRN DVNC (STAPLE)
STAPLER RELOAD 4.3X60 GREEN (STAPLE)
STAPLER RELOAD 4.3X60 GRN DVNC (STAPLE)
STOPCOCK 4 WAY LG BORE MALE ST (IV SETS) ×4 IMPLANT
SURGILUBE 2OZ TUBE FLIPTOP (MISCELLANEOUS) IMPLANT
SUT MNCRL AB 4-0 PS2 18 (SUTURE) ×3 IMPLANT
SUT PDS AB 1 CT1 27 (SUTURE) ×4 IMPLANT
SUT PROLENE 0 CT 2 (SUTURE) IMPLANT
SUT PROLENE 2 0 KS (SUTURE) IMPLANT
SUT PROLENE 2 0 SH DA (SUTURE) IMPLANT
SUT SILK 2 0 (SUTURE)
SUT SILK 2 0 SH CR/8 (SUTURE) IMPLANT
SUT SILK 2-0 18XBRD TIE 12 (SUTURE) IMPLANT
SUT SILK 3 0 (SUTURE)
SUT SILK 3 0 SH CR/8 (SUTURE) ×1 IMPLANT
SUT SILK 3-0 18XBRD TIE 12 (SUTURE) IMPLANT
SUT V-LOC BARB 180 2/0GR6 GS22 (SUTURE) ×2
SUT VIC AB 2-0 SH 27 (SUTURE) ×2
SUT VIC AB 2-0 SH 27X BRD (SUTURE) ×1 IMPLANT
SUT VIC AB 3-0 SH 18 (SUTURE) IMPLANT
SUT VIC AB 3-0 SH 27 (SUTURE)
SUT VIC AB 3-0 SH 27XBRD (SUTURE) IMPLANT
SUT VICRYL 0 UR6 27IN ABS (SUTURE) ×2 IMPLANT
SUTURE V-LC BRB 180 2/0GR6GS22 (SUTURE) ×1 IMPLANT
SYR 10ML ECCENTRIC (SYRINGE) ×2 IMPLANT
SYS LAPSCP GELPORT 120MM (MISCELLANEOUS)
SYS WOUND ALEXIS 18CM MED (MISCELLANEOUS) ×2
SYSTEM LAPSCP GELPORT 120MM (MISCELLANEOUS) IMPLANT
SYSTEM WOUND ALEXIS 18CM MED (MISCELLANEOUS) ×2 IMPLANT
TAPE UMBILICAL 1/8 X36 TWILL (MISCELLANEOUS) ×1 IMPLANT
TOWEL OR NON WOVEN STRL DISP B (DISPOSABLE) ×2 IMPLANT
TRAY FOLEY MTR SLVR 16FR STAT (SET/KITS/TRAYS/PACK) ×2 IMPLANT
TROCAR ADV FIXATION 5X100MM (TROCAR) ×2 IMPLANT
TUBING CONNECTING 10 (TUBING) ×4 IMPLANT
TUBING INSUFFLATION 10FT LAP (TUBING) ×2 IMPLANT

## 2022-08-19 NOTE — Interval H&P Note (Signed)
History and Physical Interval Note:  08/19/2022 12:23 PM  Maria Riley  has presented today for surgery, with the diagnosis of ILEOSTOMY FOR BOWEL RESECTION, DESIRE FOR OSTOMY TAKEDOWN.  The various methods of treatment have been discussed with the patient and family. After consideration of risks, benefits and other options for treatment, the patient has consented to  Procedure(s): XI ROBOTIC ASSISTED END ILEOSTOMY TAKEDOWN (N/A) LYSIS OF ADHESIONS (N/A) RIGID PROCTOSCOPY (N/A) as a surgical intervention.  The patient's history has been reviewed, patient examined, no change in status, stable for surgery.  I have reviewed the patient's chart and labs.  Questions were answered to the patient's satisfaction.    I have re-reviewed the the patient's records, history, medications, and allergies.  I have re-examined the patient.  I again discussed intraoperative plans and goals of post-operative recovery.  The patient agrees to proceed.  Maria Riley  1991-05-11 [id number]  Patient Care Team: [name], NP as PCP - General (Internal Medicine) Brock Bad, MD as Consulting Physician (Obstetrics and Gynecology) Karie Soda, MD as Consulting Physician (General Surgery)  Patient Active Problem List   Diagnosis Date Noted   MDD (major depressive disorder), recurrent episode, severe (HCC) 07/08/2018    Priority: High   History of preterm delivery 11/23/2016    Priority: High   History of pulmonary embolism 05/22/2022   Hypokalemia 02/19/2022   Sepsis (HCC) 02/18/2022   Hyponatremia 02/18/2022   History of DVT (deep vein thrombosis) 02/18/2022   Abdominal wall abscess 02/17/2022   Ileostomy in place [facility]) 01/26/2022   Stillborn, abnormal 01/26/2022   UTI (urinary tract infection) 01/23/2022   Pelvic abscess in female 01/22/2022   Acute deep vein thrombosis (DVT) of iliac vein of left lower extremity (HCC) 01/22/2022   Acute deep vein thrombosis (DVT) of femoral vein of  right lower extremity (HCC) 01/22/2022   SAB (spontaneous abortion) 04/29/2021   Overdose 12/19/2020   Antepartum placental abruption 06/13/2018   PPH (postpartum hemorrhage) 06/13/2018   Non-English speaking patient (Spanish) 06/13/2018   Supervision of high risk pregnancy, antepartum 11/23/2016    Past Medical History:  Diagnosis Date   Anemia    Medical history non-contributory    Pulmonary embolism (HCC)     Past Surgical History:  Procedure Laterality Date   ABDOMINAL HYSTERECTOMY     CESAREAN SECTION N/A 06/13/2018   Procedure: CESAREAN SECTION;  Surgeon: Horn [place] Bing, MD;  Location: [facility] BIRTHING SUITES;  Service: Obstetrics;  Laterality: N/A;   IR CATHETER TUBE CHANGE  02/05/2022   IR IMAGE GUIDED DRAINAGE BY PERCUTANEOUS CATHETER  02/18/2022   IR RADIOLOGIST EVAL & MGMT  02/04/2022   IR RADIOLOGIST EVAL & MGMT  02/12/2022   IR RADIOLOGIST EVAL & MGMT  03/11/2022   WISDOM TOOTH EXTRACTION Bilateral     Social History   Socioeconomic History   Marital status: Married    Spouse name: Not on file   Number of children: Not on file   Years of education: Not on file   Highest education level: Not on file  Occupational History   Not on file  Tobacco Use   Smoking status: Never   Smokeless tobacco: Never  Vaping Use   Vaping Use: Never used  Substance and Sexual Activity   Alcohol use: No   Drug use: No   Sexual activity: Yes    Birth control/protection: None, Surgical  Other Topics Concern   Not on file  Social History Narrative   Not on  file   Social Determinants of Health   Financial Resource Strain: Not on file  Food Insecurity: Not on file  Transportation Needs: Not on file  Physical Activity: Not on file  Stress: Not on file  Social Connections: Not on file  Intimate Partner Violence: Not on file    Family History  Problem Relation Age of Onset   Healthy Father    Alcohol abuse Neg Hx    Arthritis Neg Hx    Asthma Neg Hx    Birth defects  Neg Hx    Cancer Neg Hx    COPD Neg Hx    Depression Neg Hx    Diabetes Neg Hx    Drug abuse Neg Hx    Early death Neg Hx    Hearing loss Neg Hx    Heart disease Neg Hx    Hyperlipidemia Neg Hx    Hypertension Neg Hx    Kidney disease Neg Hx    Learning disabilities Neg Hx    Mental illness Neg Hx    Mental retardation Neg Hx    Miscarriages / Stillbirths Neg Hx    Stroke Neg Hx    Vision loss Neg Hx    Varicose Veins Neg Hx     Medications Prior to Admission  Medication Sig Dispense Refill Last Dose   amoxicillin (AMOXIL) 875 MG tablet Take 1 tablet (875 mg total) by mouth 2 (two) times daily for 10 days. 20 tablet 0 Past Week   apixaban (ELIQUIS) 5 MG TABS tablet Take 1 tablet (5 mg total) by mouth 2 (two) times daily. 60 tablet 2 08/16/2022   levothyroxine (SYNTHROID) 25 MCG tablet Take 1 tablet (25 mcg total) by mouth daily. 60 tablet 0 08/19/2022 at 0900   fluconazole (DIFLUCAN) 150 MG tablet Take 1 tablet (150 mg total) by mouth daily. 1 tablet 1 08/17/2022   pantoprazole (PROTONIX) 40 MG tablet Take 1 tablet (40 mg total) by mouth daily. (Patient not taking: Reported on 08/11/2022) 30 tablet 0 Not Taking    Current Facility-Administered Medications  Medication Dose Route Frequency Provider Last Rate Last Admin   bisacodyl (DULCOLAX) EC tablet 20 mg  20 mg Oral Once Karie Soda, MD       bupivacaine liposome (EXPAREL) 1.3 % injection 266 mg  20 mL Infiltration Once Karie Soda, MD       cefoTEtan (CEFOTAN) 2 g in sodium chloride 0.9 % 100 mL IVPB  2 g Intravenous On Call to OR Karie Soda, MD       Chlorhexidine Gluconate Cloth 2 % PADS 6 each  6 each Topical Once Karie Soda, MD       And   Chlorhexidine Gluconate Cloth 2 % PADS 6 each  6 each Topical Once Karie Soda, MD       [START ON 08/20/2022] feeding supplement (ENSURE PRE-SURGERY) liquid 296 mL  296 mL Oral Once Karie Soda, MD       feeding supplement (ENSURE PRE-SURGERY) liquid 592 mL  592 mL Oral Once  Karie Soda, MD       lactated ringers infusion   Intravenous Continuous Val Eagle, MD         Allergies  Allergen Reactions   Tape     Adhesive tape- rash    Ht 4\' 11"  (1.499 m)   Wt 52.5 kg   LMP 06/08/2021 (Approximate)   BMI 23.38 kg/m   Labs: No results found for this or any previous visit (from the past  48 hour(s)).  Imaging / Studies: No results found.   Adin Hector, M.D., F.A.C.S. Gastrointestinal and Minimally Invasive Surgery [facility] [address], [place], [place] [address] [phone] Main / Paging  08/19/2022 12:23 PM    Adin Hector

## 2022-08-19 NOTE — H&P (Signed)
08/19/2022    Patient Care Team: None as PCP - General Jeoffrey Massed, MD as Consulting Provider (General Surgery)  PROVIDER: Jarrett Soho, MD  [facility] MRN: [id number] DOB: September 05, 1991 DATE OF ENCOUNTER: 03/10/2022  SUBJECTIVE   Chief Complaint: Ileostomy   History of Present Illness: Maria Riley is a 31 y.o. female who is seen today  as an office consultation at the request of Dow Adolph to consider ostomy takedown .   Hispanic woman speaks no [place]. Telephone interpreter used. Here with her sister-in-law who speak Spanish only as well. She has had a history of miscarriages with pregnancies. Was pregnant and on traveling in [place]. Unfortunately required emergency C-section with bleeding December 10, 2021. She had worsening pain and shock and was reoperating around a few days later.. 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 Hemorrhagic shock. End ileostomy. This was in January down in [place]. She came back to the [place] region where she lives. Was admitted through the [facility] health system in mid February with pain and pelvic abscesses. Drain placed. Diagnosed with a DVT then returned last month with another abscess. Another drain placed.  Patient notes she is feeling better. She has 1 drain remaining. Is putting very minimal out. Her appetite is better and she is eating relatively well. She empties her ileostomy bag about 4-6 times a day. Not lightheaded or dizzy. She is off pain medications. She denies any history inflammatory bowel disease or irritable bowel syndrome. Does not smoke. No diabetes. She is on a proton pump inhibitor.  She was also diagnosed with a DVT of lower extremity. Placed on Eliquis anticoagulation in mid February during her hospitalization. She remains on that. She recalls being told in the hospital and records note that recommendation made by medicine for 3-6 months of treatment. She followed up at primary  care office with the nurse practitioner. She notes she usually moves her bowels once or twice a day before emergency surgery. No exertional chest pain or shortness of breath. Normally can walk at least 1/2-hour without difficulty. She has had C-section.  Medical History:  History reviewed. No pertinent past medical history.  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   History reviewed. No pertinent family history.   Social History   Tobacco Use  Smoking Status Never  Smokeless Tobacco Never    Social History   Socioeconomic History   Marital status: Married  Tobacco Use   Smoking status: Never   Smokeless tobacco: Never  Substance and Sexual Activity   Alcohol use: Never   Drug use: Never   ############################################################  Review of Systems: A complete review of systems (ROS) was obtained from the patient. I have reviewed this information and discussed as appropriate with the patient. See HPI as well for other pertinent ROS.  Constitutional: No fevers, chills, sweats. Weight stable Eyes: No vision  changes, No discharge HENT: No sore throats, nasal drainage Lymph: No neck swelling, No bruising easily Pulmonary: No cough, productive sputum CV: No orthopnea, PND . No exertional chest/neck/shoulder/arm pain. Patient can walk 1 mile without difficulty.   GI: No personal nor family history of  GI/colon cancer, inflammatory bowel disease, irritable bowel syndrome, allergy such as Celiac Sprue, dietary/dairy problems, colitis, ulcers nor gastritis. No recent sick contacts/gastroenteritis. No travel outside the country. No changes in diet.  Renal: No UTIs, No hematuria Genital: No drainage, bleeding, masses Musculoskeletal: No severe joint pain. Good ROM major joints Skin: No sores or lesions Heme/Lymph: No easy bleeding. No swollen lymph nodes Neuro: No active seizures. No facial droop Psych: No hallucinations. No agitation  OBJECTIVE   Vitals:  03/10/22 1038  BP: 124/82  Pulse: 91  Temp: 36.4 C (97.6 F)  SpO2: 98%  Weight: 57.2 kg (126 lb)  Height: 149.9 cm (4\' 11" )   Body mass index is 25.45 kg/m.  PHYSICAL EXAM:  Constitutional: Not cachectic. Hygeine adequate. Vitals signs as above. Not toxic nor sickly. Eyes: Pupils reactive, normal extraocular movements. Sclera nonicteric Neuro: CN II-XII intact. No major focal sensory defects. No major motor deficits. Lymph: No head/neck/groin lymphadenopathy Psych: No severe agitation. No severe anxiety. Judgment & insight Adequate, Oriented x4, HENT: Normocephalic, Mucus membranes moist. No thrush. Hearing: adequate Neck: Supple, No tracheal deviation. No obvious thyromegaly Chest: No pain to chest wall compression. Good respiratory excursion. No audible wheezing CV: Pulses intact. regular. No major extremity edema Ext: No obvious deformity or contracture. Edema: Not present. No cyanosis Skin: No major subcutaneous nodules. Warm and dry Musculoskeletal: Severe joint rigidity not present. No obvious clubbing. No digital petechiae. Mobility: no assist device moving easily without restrictions  Abdomen: Flat Soft. Nondistended. Nontender. Hernia: Not present. Diastasis recti: Not present. No hepatomegaly. No splenomegaly. The ostomy pink with mildly thick effluent in bag. Good seal.  Genital/Pelvic: Inguinal hernia: Not  present. Inguinal lymph nodes: without lymphadenopathy nor hidradenitis.   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  She is now 3 months out from that. However she has had complications with diagnosis of DVT in February and had postop abscess requiring drainage in February and in March.  Follow-up drain study tomorrow. Outputs been low and she is feeling better so hopefully all abscesses resolved.  Reasonable to consider ostomy takedown 3 months from when the last drain was removed. That would place that in July. Reasonable immune invasive robotic approach. Takedown of end ileostomy intracorporeal anastomosis. No need for bowel prep. Do not think we need colonoscopy or firefly  Bedside Spanish interpreter used.  The anatomy & physiology of the digestive tract was discussed. The pathophysiology was discussed. Possibility of remaining with an ostomy permanently was  discussed. I offered ostomy takedown. Laparoscopic & open techniques were discussed.   Risks such as bleeding, infection, abscess, leak, reoperation, possible re-ostomy, injury to other  organs, need for repair of tissues / organs, need for further treatment, hernia, heart attack, death, and other risks were discussed. I noted a good likelihood this will help address the problem. Goals of post-operative recovery were discussed as well. We will work to minimize complications. Questions were answered. The patient expresses understanding & wishes to proceed with surgery.    Ardeth Sportsman, MD, FACS, MASCRS Esophageal, Gastrointestinal & Colorectal Surgery Robotic and Minimally Invasive Surgery  [facility] [address], [address] [place], [place] [address] [phone] Fax [phone] Main  CONTACT INFORMATION:  Weekday (9AM-5PM): Call [facility] main office at [phone]  Weeknight (5PM-9AM) or Weekend/Holiday: Check [email or web address] (password " [email or web address]") for General Surgery [facility] coverage  (Please, do not use SecureChat as it is not reliable communication to reach operating surgeons for immediate patient care given surgeries/outpatient duties/clinic/cross-coverage/off post-call which would lead to a delay in care.  Epic staff messaging available for outptient concerns, but may not be answered for 48 hours or more).    08/19/2022

## 2022-08-19 NOTE — Anesthesia Postprocedure Evaluation (Signed)
Anesthesia Post Note  Patient: Maria Riley  Procedure(s) Performed: XI ROBOTIC ASSISTED END ILEOSTOMY TAKEDOWN (Abdomen) LYSIS OF ADHESIONS (Abdomen)     Patient location during evaluation: PACU Anesthesia Type: General Level of consciousness: sedated Pain management: pain level controlled Vital Signs Assessment: post-procedure vital signs reviewed and stable Respiratory status: spontaneous breathing and respiratory function stable Cardiovascular status: stable Postop Assessment: no apparent nausea or vomiting Anesthetic complications: no   No notable events documented.  Last Vitals:  Vitals:   08/19/22 1645 08/19/22 1700  BP: 101/65   Pulse: 69 70  Resp:    Temp:    SpO2: 100% 100%    Last Pain:  Vitals:   08/19/22 1645  PainSc: Asleep                 Quante Pettry DANIEL

## 2022-08-19 NOTE — Transfer of Care (Signed)
Immediate Anesthesia Transfer of Care Note  Patient: Maria Riley  Procedure(s) Performed: XI ROBOTIC ASSISTED END ILEOSTOMY TAKEDOWN (Abdomen) LYSIS OF ADHESIONS (Abdomen)  Patient Location: PACU  Anesthesia Type:General  Level of Consciousness: drowsy  Airway & Oxygen Therapy: Patient Spontanous Breathing and Patient connected to face mask oxygen  Post-op Assessment: Report given to RN and Post -op Vital signs reviewed and stable  Post vital signs: Reviewed and stable  Last Vitals:  Vitals Value Taken Time  BP 96/63 08/19/22 1612  Temp    Pulse 75 08/19/22 1615  Resp 20 08/19/22 1615  SpO2 100 % 08/19/22 1615  Vitals shown include unvalidated device data.  Last Pain:  Vitals:   08/19/22 1125  PainSc: 0-No pain         Complications: No notable events documented.

## 2022-08-19 NOTE — Discharge Instructions (Signed)
############################################## ############################################## ############################################## ###  CIRUGA: INSTRUCCIONES POST OPERATORIAS (Ciruga de obstruccin del intestino delgado, reseccin de colon, etc.)  ############################################## ############################################## ############################################## ###  COMER Transicin gradual a una dieta alta en fibra con un suplemento de [employer] prximos das despus del alta  CAMINAR [place] una hora al da. Controla tu dolor para hacer eso.  CONTROLAR EL DOLOR Controle el dolor para que pueda caminar, dormir, Tree surgeon estornudos/tos, subir/bajar escaleras.  TENGA UNA DEPOSICIN DIARIAMENTE Mantenga sus evacuaciones regulares para evitar problemas. Est bien probar un laxante para anular el estreimiento. Est bien usar un antidiarreico para Presenter, broadcasting. Llame si no mejora despus de 2 intentos  LLAME SI TIENE PROBLEMAS/INQUIETUDES Llame si todava tiene problemas a pesar de seguir estas instrucciones. Llame si tiene inquietudes que no han sido respondidas por estas instrucciones  ############################################## ############################################## ############################################## ###  DIETA Seguir una dieta ligera los [place]. Comience con una dieta blanda como sopas, lquidos, alimentos ricos en almidn, alimentos bajos en grasa, etc. Si se siente lleno, hinchado o estreido, siga una dieta lquida completa o en pur/licuada durante unos [employer] se sienta mejor y no ms tiempo estreido. Asegrese de beber muchos lquidos [employer] para evitar deshidratarse (sentirse mareado, no Garment/textile technologist, Social research officer, government.). Agregue gradualmente un suplemento de fibra a su dieta durante la prxima semana. Vuelva gradualmente a una dieta slida regular. Evite la comida rpida o las comidas pesadas la  primera semana, ya que es ms probable que tenga nuseas. Se espera que su tracto digestivo necesite algunos meses para volver a la normalidad. Es comn que sus evacuaciones intestinales y heces sean irregulares. Tendr hinchazn y calambres ocasionales que eventualmente desaparecern. Hasta que est comiendo alimentos slidos normalmente, deje de tomar todos los medicamentos para Conservation officer, historic buildings y regrese a sus actividades regulares; sus intestinos no sern normales. Concntrese en llevar una dieta baja en grasas y alta en fibra por el resto de su vida (consulte Cmo lograr una buena salud intestinal, a continuacin).  CUIDADO de su INCISIN o HERIDA Es bueno para incisiones cerradas e incluso heridas abiertas para lavarse [employer]. [place]. Los baos cortos Manpower Inc. [place] y heridas con agua y [place].  Si tiene una(s) incisin(es) cerrada(s), lvela con agua y [employer]s. Puede dejar las incisiones cerradas abiertas al aire si est seco. Puede cubrir la incisin con una gasa limpia y volver a colocarla despus de su ducha diaria para mayor comodidad.  Es bueno [place] heridas [employer]. [place]. Los baos cortos [employer] Inc. [place] y heridas con agua y [place]. Puede dejar las incisiones cerradas abiertas al aire si est seco. Puede cubrir la incisin con una gasa limpia y volver a colocarla despus de su ducha diaria para mayor comodidad.  TEGADERM: Tiene vendajes de gasa transparente sobre la(s) incisin(es) cerrada(s). Retire los vendajes 3 das despus de la [place].  Si tiene una herida abierta con Ardelia Mems aspiradora para heridas, consulte las instrucciones de cuidado de la aspiradora para heridas.  ACTIVIDADES segn tolerancia Inicie actividades diarias livianas (cuidado personal, caminar, subir escaleras) a partir del da posterior a la [place]. Aumente gradualmente las actividades  segn lo tolere. Controla tu dolor para Dietitian. Detngase cuando est cansado. Lo ideal es caminar varias veces al da, eventualmente una hora al da. La [employer] de las personas regresan a Media planner de las actividades cotidianas en unas  pocas semanas. Se necesitan de 4 a 8 semanas para volver a la actividad intensa y sin restricciones. Si puede caminar 30 minutos sin dificultad, es seguro intentar una actividad ms intensa como trotar, caminar en [employer], andar en bicicleta, ejercicios aerbicos de bajo impacto, nadar, etc. Guarde la actividad ms intensa y extenuante para el final (por lo general, de 4 a 8 semanas despus de la [place]), como abdominales, levantar objetos pesados, deportes de contacto, etc. Abstngase de cualquier trabajo pesado intenso o esfuerzo hasta que deje de tomar narcticos para Financial controller. Tendr [employer], pero las cosas deberan mejorar semana a semana.  NO EMPUJE A TRAVS DEL DOLOR. Deja que el dolor sea tu gua: si te duele hacer algo, no lo hagas. El dolor es tu cuerpo advirtindote que evites esa actividad durante otra semana hasta que el dolor disminuya. Puede conducir cuando ya no est tomando medicamentos narcticos recetados para Conservation officer, historic buildings, puede usar cmodamente el cinturn de seguridad y puede hacer giros/paradas repentinos de Geographical information systems officer segura para protegerse sin vacilar debido al [employer]. Puede tener relaciones sexuales cuando le resulte cmodo. Si te duele hacer algo, detente.  [place] los medicamentos que le recetan normalmente en casa, a menos que le indiquen lo contrario. Anticoagulantes: Por lo general, puede reiniciar cualquier anticoagulante fuerte despus del segundo da posoperatorio. Est bien tomar una aspirina de inmediato. Si ustedest tomando anticoagulantes fuertes (warfarina/Coumadin, Plavix, Xerelto, Eliquis, Pradaxa, etc.), hable con su cirujano, mdico de atencin [place] y/o cardilogo para obtener instrucciones  sobre cundo Teacher, music y si es necesario controlar la sangre (PT Cecile Sheerer de sangre INR, etc.).  CONTROL DE DOLOR El dolor despus de la ciruga o relacionado con la actividad a menudo se debe a una tensin/lesin en el msculo, tendn, nervios y/o incisiones. Este dolor suele ser a corto plazo y Teacher, English as a foreign language en unos pocos meses. Para ayudar a acelerar el proceso de curacin y volver a la actividad normal ms rpidamente, HAGAN LAS SIGUIENTES COSAS JUNTOS: 1. Aumente la [employer]. NO EMPUJE A TRAVS DEL DOLOR 2. [place] Hielo y/o Calor 3. Prueba masajes suaves y/o estiramientos 4. Tome analgsicos de venta libre 5. Tome analgsicos recetados con narcticos para el dolor ms intenso  Buen control del dolor = recuperacin ms rpida. Es mejor tomar ms medicamentos para estar ms activo que quedarse en cama todo el da para [employer]. 1. Incrementa la actividad gradualmente Evite levantar objetos pesados al principio, luego aumente a levantar segn lo tolere durante las prximas 6 semanas. No "empuje a travs" del dolor. Escucha a tu cuerpo y Fish farm manager posiciones y Environmental manager que Engineer, technical sales. Espera unos das antes de probar algo ms intenso. Se recomienda caminar una hora al da para ayudar a que su cuerpo se recupere ms rpido y de manera ms segura. Comience lentamente y detngase cuando Corporate treasurer. Si puedes caminar 30 minutos sin parar ni sentir dolor, puedes probar con actividades ms intensas (correr, trotar, [employer], andar en bicicleta, nadar, caminadora, sexo, deportes, levantamiento de pesas, etc.) Recuerda: si te duele hacerlo, no lo hagas! 2. [place] Hielo y/o Calor Tendr hinchazn y moretones alrededor de las incisiones. Esto tardar varias semanas en resolverse. Bolsas de hielo o almohadillas trmicas (6 a 8 veces al da, 30 a 60 minutos cada vez) ayudarn a Best boy y los moretones. Algunas Presenter, broadcasting solo, calor  solo o Museum/gallery exhibitions officer hielo y Freight forwarder. Experimente y vea qu funciona mejor para usted. Considere probar el hielo  durante los primeros das para ayudar a disminuir la hinchazn y los moretones; [place], cambie a calor para ayudar a Media planner los puntos doloridos y Engineer, water. [place]. Los baos cortos Manpower Inc. Se siente bien! [place] incisiones y heridas limpias con agua y [place]. 3. Prueba masajes suaves y/o estiramientos Masaje en el rea del dolor muchas veces al da Detente si sientes dolor, no te excedas. 4. Tome analgsicos de venta libre Esto ayuda a que los tejidos musculares y nerviosos se vuelvan menos irritables y se calmen ms rpido. Elija UNO de los siguientes medicamentos antiinflamatorios de venta libre: Pastillas de 500 mg de acetaminofn (Tylenol) 1 o 2 pastillas con cada comida y justo antes de acostarse (evtelo si tiene problemas hepticos o si tiene acetaminofn en su receta de narcticos) Tabletas de naproxeno de 220 mg (p. ej., Aleve, Naprosyn) 1 o 2 tabletas dos veces al da (evtela si tiene problemas de rin, [place], EII o sangrado) Pastillas de 200 mg de ibuprofeno (p. ej., Advil, Motrin) 3 o 4 pastillas con cada comida y justo antes de acostarse (evtelo si tiene problemas de [place], [place], EII o sangrado) Tmelo con comida/refrigerio varias veces al da segn las indicaciones durante al menos 2 semanas para ayudar a Electrical engineer bajo y ms manejable. 5. Tome analgsicos recetados con narcticos para el dolor ms intenso A menudo se le da una receta para un fuerte control del dolor despus del alta (por ejemplo: oxicodona/Percocet, hidrocodona/Norco/Vicodin o tramadol/Ultram) Tome su medicamento para el dolor segn lo prescrito. Tenga en cuenta que la mayora de las recetas de narcticos tambin contienen Tylenol (acetaminofeno); evite tomar demasiado Tylenol. Si tiene problemas/inquietudes con Dentist recetado (no  controla el dolor, las nuseas, los vmitos, el sarpullido, la picazn, etc.), llmenos al [phone] para ver si necesitamos cambiarlo por otro para Conservation officer, historic buildings. medicamento que funcionar mejor para usted y/o Chief Technology Officer mejor sus efectos secundarios. Si necesita un resurtido de su medicamento para Conservation officer, historic buildings, debe llamar a la oficina antes de las 4:00 p. m. y solo entre semana. Por ley federal, las recetas de narcticos no se pueden pedir en Museum/gallery exhibitions officer. Deben ser llenados en papel y recogidos en nuestra oficina por el paciente o cuidador autorizado. Las recetas no se pueden surtir despus de las 4 pm ni los fines de [place].  CUANDO LLAMARNOS [phone] Dolor intenso no controlado o que [place] superior a 101 F (38,5 C) Inquietudes con la incisin: Empeoramiento del dolor, enrojecimiento, sarpullido/urticaria, hinchazn, sangrado o drenaje Reacciones/problemas con nuevos medicamentos (picazn, sarpullido, urticaria, nuseas, etc.) Nuseas y/o vmitos Dificultad para orinar Respiracin dificultosa Empeoramiento de la fatiga, mareos, aturdimiento, visin borrosa Otras preocupaciones  Si no mejora despus de DIRECTV o nota que [place], comunquese con nuestra oficina al [phone] para obtener ms consejos. Es posible que necesitemos ajustar sus medicamentos, volver a Art therapist, enviarlo a la sala de emergencias o ver qu otras cosas podemos hacer para ayudarlo. El personal de la clnica est disponible para responder sus preguntas durante el horario comercial habitual (8:30 a. m. a 5 p. m.). No dudes en llamar y preguntarpara hablar con Ardelia Mems de nuestras enfermeras por inquietudes clnicas.  Un cirujano de [facility] est siempre de guardia en los hospitales las 24 horas del da. Si tiene AT&T, vaya a la sala de emergencias ms cercana o llame al 911.  SEGUIMIENTO en [place] de su alta del hospital (  o el siguiente  da laborable de la semana), llame a [facility] para programar o confirmar una cita para ver a su cirujano en el consultorio para una cita de seguimiento. Por lo general, es de 2 a 3 semanas despus de la [place]. Si tiene grapas para la piel en su(s) incisin(es), infrmele al consultorio para que podamos programar un horario en el consultorio para que la enfermera se las quite (generalmente alrededor de Fisher Scientific de la [place]). Asegrese de llamar para Ambulance person del alta (o el siguiente da laborable de la semana) del hospital para garantizar una hora de cita conveniente. SI TIENE FORMULARIOS DE DISCAPACIDAD O LICENCIA FAMILIAR, LLEVARLOS A LA OFICINA PARA PROCESARLOS. NO SE LOS D A SU MDICO.  Ciruga de [facility], [place] [address], [place], [place] [address] ? [phone] - Principal [phone] Bubba Hales [place], [phone] - Fax [email or web address]  LLEGAR A UNA BUENA SALUD INTESTINAL.  Se espera que su tracto digestivo necesite algunos meses para volver a la normalidad. Es comn que sus evacuaciones intestinales y heces sean irregulares. Tendr hinchazn y calambres ocasionales que eventualmente desaparecern. Hasta que est comiendo alimentos slidos normalmente, deje de tomar todos los medicamentos para Chief Technology Officer y regrese a sus actividades regulares; sus intestinos no sern normales. Evitar el estreimiento El objetivo: UNA EVACUACIN INTESTINAL SUAVE AL DA!  Beber mucho lquido. Elige agua primero.  TOMA UN SUPLEMENTO DE FIBRA CADA DA EL RESTO DE TU VIDA  Durante su primera semana de regreso a casa, agregue gradualmente un suplemento de [employer].  Experimente qu forma puede tolerar. Hay muchas formas como polvos, tabletas, obleas, gomitas, etc. Salvado de psyllium (Metamucil), metilcelulosa (Citrucel), Miralax o Glycolax, Benefiber, Linaza.  Ajuste la dosis semana a semana (1/2  dosis/da a 6 dosis al da) hasta que est defecando 1-2 veces al [employer] Worldwide. Reduzca la dosis o pruebe con un producto de fibra diferente si le causa problemas como diarrea o distensin abdominal.  A veces, se necesita un laxante para ayudar a impulsar los intestinos si est estreido hasta que el suplemento de fibra pueda ayudar a regular sus intestinos. Si tolera comer y se est tirando pedos, est bien probar un laxante [employer] la dosis doble de [place], [place] de ciruela pasa o leche de magnesia. Evite el uso de laxantes con demasiada frecuencia.  Los ablandadores de heces a veces pueden ayudar a Games developer de estreimiento de los analgsicos narcticos. Tambin puede causar diarrea, as que evite usarlo por [employer]. Si todava est estreido a pesar de [employer], comer slidos y algunas dosis de laxantes, llame a nuestra oficina.  Controlando la diarrea Intente beber lquidos y comer alimentos blandos durante unos das para Museum/gallery exhibitions officer an ms sus intestinos. Evite los productos lcteos (especialmente la [place] y el helado) por un corto tiempo. Los intestinos a menudo pueden perder la capacidad de digerir la lactosa cuando estn estresados. Evite los alimentos que causan gases o hinchazn. Los alimentos tpicos incluyen frijoles y otras legumbres, repollo, brcoli y productos lcteos. Evite las comidas grasosas, picantes y rpidas. Cada persona tiene cierta sensibilidad a otros alimentos, as que escuche a su cuerpo y evite aquellos alimentos que le provoquen problemas. Los probiticos (como el yogur [place], Librarian, academic, etc.) pueden ayudar a [employer] intestinos y el colon con bacterias normales y Optician, dispensing un tracto digestivo sensible Agregar un suplemento de Research scientist (life sciences) gradualmente puede ayudar a espesar las heces al absorber  el exceso de lquido y Programme researcher, broadcasting/film/video a Radio producer a los intestinos para que acten con ms normalidad. Aumente lentamente la dosis durante unas pocas semanas.  Demasiada fibra demasiado pronto puede ser contraproducente y causar calambres e hinchazn. Est bien tratar de disminuir la diarrea con unas pocas dosis de medicamentos antidiarreicos. El subsalicilato de bismuto (por ejemplo, Kayopectate, Pepto Bismol) en algunas dosis puede ayudar a Landscape architect. Evitar si est embarazada. La loperamida (Imodium) puede retrasar la diarrea. Comience con una tableta (2 mg) primero. Evtelo si tiene fiebre o dolor intenso.  LOS PACIENTES CON ILEOSTOMA TENDRN DIARREA CRNICA ya que su colon no est en uso. Beba muchos lquidos. Deber beber incluso ms vasos de agua/lquido al da para evitar deshidratarse.  Registre el resultado de su ileostoma. Espere vaciar la bolsa cada 3-4 horas al principio. La [employer]son de las personas con una ileostoma permanente vacan su bolsa de 4 a 6 veces como mnimo. Use medicamentos antidiarreicos (especialmente Imodium) varias veces al da para evitar deshidratarse. Comience con Neomia Dear dosis a la hora de acostarse y Engineer, maintenance. Ajuste hacia arriba o hacia abajo segn sea necesario. Aumente los medicamentos antidiarreicos segn las indicaciones para evitar vaciar la bolsa ms de 8 veces al da (cada 3 horas).  Trabaje con su enfermera de ostoma de heridas para aprender a cuidar su ostoma. Consulte las instrucciones de cuidado de la ostoma.  RESOLUCIN DE PROBLEMAS DE INTESTINOS IRREGULARES 1) Comience con una dieta suave y blanda. Nada de comidas picantes, grasosas o fritas. 2) Evita el excesoen/trigo o productos lcteos de la dieta para ver si los sntomas mejoran. 3) Miralax 17gm o semillas de lino mezcladas en 8oz. agua o jugo-diariamente. Puede usar 2-4 veces al da segn sea necesario. 4) Gas-X, Phazyme, etc. segn sea necesario para gases e hinchazn. 5) Prilosec (omeprazol) de venta libre segn sea necesario 6) Considere los probiticos (Align, Printmaker, etc.) para ayudar a Optician, dispensing los intestinos  Llame a su mdico si  est empeorando o no est mejorando. A veces, se pueden necesitar ms pruebas (cultivos, endoscopia, estudios de rayos X, tomografas computarizadas, anlisis de [place], etc.) para ayudar a Education administrator y tratar la causa de la diarrea. Ciruga de [facility], [place] [address], [address], [place], [place] [address] [phone] - Principal. [phone] - Ellen Henri gratuita. [phone] - Fax [email or web address]

## 2022-08-19 NOTE — Anesthesia Preprocedure Evaluation (Addendum)
Anesthesia Evaluation  Patient identified by MRN, date of birth, ID band Patient awake    Reviewed: Allergy & Precautions, NPO status , Patient's Chart, lab work & pertinent test results  History of Anesthesia Complications Negative for: history of anesthetic complications  Airway Mallampati: II  TM Distance: >3 FB Neck ROM: Full    Dental no notable dental hx. (+) Dental Advisory Given   Pulmonary PE   Pulmonary exam normal        Cardiovascular + DVT  Normal cardiovascular exam     Neuro/Psych PSYCHIATRIC DISORDERS Depression negative neurological ROS     GI/Hepatic Neg liver ROS,   Endo/Other  Hypothyroidism   Renal/GU negative Renal ROS     Musculoskeletal negative musculoskeletal ROS (+)   Abdominal   Peds  Hematology  (+) Blood dyscrasia, anemia ,   Anesthesia Other Findings   Reproductive/Obstetrics                            Anesthesia Physical Anesthesia Plan  ASA: 2  Anesthesia Plan: General   Post-op Pain Management: Celebrex PO (pre-op)* and Tylenol PO (pre-op)*   Induction: Intravenous  PONV Risk Score and Plan: 4 or greater and Ondansetron, Dexamethasone, Midazolam and Scopolamine patch - Pre-op  Airway Management Planned: Oral ETT  Additional Equipment:   Intra-op Plan:   Post-operative Plan: Extubation in OR  Informed Consent: I have reviewed the patients History and Physical, chart, labs and discussed the procedure including the risks, benefits and alternatives for the proposed anesthesia with the patient or authorized representative who has indicated his/her understanding and acceptance.     Dental advisory given  Plan Discussed with: Anesthesiologist and CRNA  Anesthesia Plan Comments:        Anesthesia Quick Evaluation

## 2022-08-19 NOTE — Op Note (Signed)
08/19/2022  3:54 PM  PATIENT:  Maria Riley  31 y.o. female  Patient Care Team: Kerin Perna, NP as PCP - General (Internal Medicine) Shelly Bombard, MD as Consulting Physician (Obstetrics and Gynecology) Michael Boston, MD as Consulting Physician (General Surgery)  PRE-OPERATIVE DIAGNOSIS:  ILEOSTOMY FOR BOWEL RESECTION, DESIRE FOR OSTOMY TAKEDOWN  POST-OPERATIVE DIAGNOSIS:  ILEOSTOMY FOR BOWEL RESECTION, DESIRE FOR OSTOMY TAKEDOWN  PROCEDURE:   XI ROBOTIC END ILEOSTOMY TAKEDOWN WITH INTRACORPORAL ANASTOMOISIS LYSIS OF ADHESIONS  SURGEON:  Adin Hector, MD  ASSISTANT: Leighton Ruff, MD, FACS, FASCRS [place], PA-S, [employer]  ANESTHESIA:     General  Regional TRANSVERSUS ABDOMINIS PLANE (TAP) nerve block for perioperative & postoperative pain control provided with liposomal bupivacaine (Experel) mixed with 0.25% bupivacaine as a Bilateral TAP block x 30mL each side at the level of the transverse abdominis & preperitoneal spaces along the flank at the anterior axillary line, from subcostal ridge to iliac crest under laparoscopic guidance   Local field block at port sites & extraction wound  EBL:  Total I/O In: 100 [IV Piggyback:100] Out: 175 [Urine:75; Blood:100]  Delay start of Pharmacological VTE agent (>24hrs) due to surgical blood loss or risk of bleeding:  no  DRAINS: none   SPECIMEN:  END ILEOSTOMY  DISPOSITION OF SPECIMEN:  PATHOLOGY  COUNTS:  YES  PLAN OF CARE: Admit to inpatient   PATIENT DISPOSITION:  PACU - hemodynamically stable.  INDICATION:    Pleasant woman who unfortunately had a miscarriage requiring urgent surgery in [place] when she was visiting family.  Then had worsening shock and had to have repeat operation with right colectomy and end ileostomy.  Sounds like cecal volvulus.  Developed DVT.  Concern from some type of coagulopathy.  Stabilized and improved.  Patient signed ileostomy takedown.   The anatomy & physiology  of the digestive tract was discussed.  The pathophysiology was discussed.  Possibility of remaining with an ostomy permanently was discussed.  I offered ostomy takedown.  Laparoscopic & open techniques were discussed.   Risks such as bleeding, infection, abscess, leak, reoperation, possible re-ostomy, injury to other organs, need for repair of tissues / organs, need for further treatment, hernia, heart attack, death, and other risks were discussed.   I noted a good likelihood this will help address the problem.  Goals of post-operative recovery were discussed as well.  We will work to minimize complications.  Questions were answered.  The patient expresses understanding & wishes to proceed with surgery.    The patient expresses understanding & wishes to proceed with surgery.  OR FINDINGS:   Patient had moderate adhesions of small bowel and omentum to anterior abdominal wall.  End ileostomy on right side of abdomen.  Mid transverse colon occipital and resting in right upper quadrant  No obvious metastatic disease on visceral parietal peritoneum or liver.  It is an isoperistaltic ileocolonic anastomosis that rests in the right periumbilical region.  CASE DATA:  Type of patient?: Elective [facility] Private Case  Status of Case? Elective Scheduled  Infection Present At Time Of Surgery (PATOS)?  NO    DESCRIPTION:   Informed consent was confirmed.  The patient underwent general anaesthesia without difficulty.  The patient was positioned with arms tucked & secured appropriately.  VTE prevention in place.  End ileostomy was sutured closed with a 0 Prolene pursestring to good result.  The patient's abdomen was clipped, prepped, & draped in a sterile fashion.  Surgical timeout confirmed our plan.  The patient was positioned in reverse Trendelenburg.  Abdominal entry was gained using Varess technique at the left subcostal ridge on the anterior abdominal wall.  No elevated EtCO2 noted.  Port placed.   Camera inspection revealed no injury.  Extra ports were carefully placed under direct laparoscopic visualization.  We docked the [employer] Vinci robot carefully and placed intstruments under visualization  Proceeded with lysis adhesions.  Took care to free the greater omentum and small bowel loops of the anterior abdominal wall using some focused blunt and sharp dissection.  This took some time.  Annitta Needs great omentum off the small bowel going up into the abdominal wall consistent with the distal ileum.  Annitta Needs great omentum off all attachments.  Is able to identify the transverse colon with a proximal staple line resting in the right epigastric region.  Consistent with findings noted on CT scan preoperatively.  We will then ran the small bowel from the end ileostomy approximately to the ligament of Treitz.  Fortunately, there were not too many adhesions.  However I did find a few loops that had dense adhesions to the retroperitoneum that were causing some internal hernias.  We carefully freed those off.  Inspected the small bowel.  Ran it to prove no serosal injury or other abnormality.  Reassuring.  Felt is reasonable to focus on ileostomy takedown.  Transected the ileal mesentery of the end ileostomy at the level of the peritoneum as the ileum went through the abdominal wall to become the end ileostomy..  Used a robotic stapler.  Used a 60 blue load and fired to transect the ileum off of the ileostomy in the abdominal wall to good result. We confirmed hemostasis.  I did a side-to-side stapled anastomosis of ileum to mid-transverse colon using a 68mm white load in an isoperistaltic fashion.  (Distal stump of ileum to mid transverse colon for the distal end of the anastomosis.  Proximal end of colon stump to more proximal ileum for the proximal end of the anastomosis).  I sewed the common staple channel wound with an absorbable suture (V-lock) in a running [place] fashion from each corner and meeting in the  center.  I did meticulous inspection prove an airtight closure.  I protected the anastomosis line with an anterior omentopexy of greater omentum using V lock suture.  Copious irrigation.  Hemostasis good.  We did reinspection of the abdomen.  Hemostasis was good.   Ureters, retroperitoneum, and bowel uninjured.  The anastomosis looked healthy.   Endoluminal gas was evacuated.  I then made a circular incision around the ileostomy skin.  Came through the dermis and subcutaneous tissue and removed the end ileostomy stump intact.  Specimen removed without incident.   Ports & wound protector removed.  Hemostasis was good.  Sterile unused instruments were used from this point.  I closed the skin at the port sites using Monocryl stitch and sterile dressing.  I closed the ileostomy wound using #1 PDS transverse anterior rectal fascial closure like a small Pfannenstiel closure.  Because she had a small ileostomy circular wound, I closed it using 2-0 Vicryl pursestring sutures at Scarpa's and dermis and then a subcuticular Monocryl as well.  That helped create a less than a centimeter diameter circular wound.    Placed antibiotics-soaked umbilical tape wicks through the center of that.  Port sites closed with interrupted Monocryl stitches.  I placed sterile dressings.     Patient is being extubated go to recovery room. I discussed postop care with  the patient in detail the office & in the holding area. Instructions are written.  I was able to find the patient's husband along with the Spanish interpreter, Lissa Hoard, in the waiting area.  I discussed operative findings, updated the patient's status, discussed probable steps to recovery, and gave postoperative recommendations to the patient's spouse.   Recommendations were made.  Questions were answered.  He expressed understanding & appreciation.   Ardeth Sportsman, M.D., F.A.C.S. Gastrointestinal and Minimally Invasive Surgery [facility] [address] [place], [place] [address] [phone] Main / Paging

## 2022-08-19 NOTE — Anesthesia Procedure Notes (Signed)
Procedure Name: Intubation Date/Time: 08/19/2022 1:05 PM  Performed by: Ezekiel Ina, CRNAPre-anesthesia Checklist: Patient identified, Emergency Drugs available, Suction available and Patient being monitored Patient Re-evaluated:Patient Re-evaluated prior to induction Oxygen Delivery Method: Circle system utilized Preoxygenation: Pre-oxygenation with 100% oxygen Induction Type: IV induction Ventilation: Mask ventilation without difficulty Laryngoscope Size: Miller and 2 Grade View: Grade I Tube type: Oral Tube size: 7.0 mm Number of attempts: 1 Airway Equipment and Method: Stylet Placement Confirmation: ETT inserted through vocal cords under direct vision, positive ETCO2 and breath sounds checked- equal and bilateral Tube secured with: Tape Dental Injury: Teeth and Oropharynx as per pre-operative assessment

## 2022-08-20 ENCOUNTER — Encounter (HOSPITAL_COMMUNITY): Payer: Self-pay | Admitting: Surgery

## 2022-08-20 LAB — CBC
HCT: 36.2 % (ref 36.0–46.0)
Hemoglobin: 12.1 g/dL (ref 12.0–15.0)
MCH: 28.8 pg (ref 26.0–34.0)
MCHC: 33.4 g/dL (ref 30.0–36.0)
MCV: 86.2 fL (ref 80.0–100.0)
Platelets: 238 10*3/uL (ref 150–400)
RBC: 4.2 MIL/uL (ref 3.87–5.11)
RDW: 13.2 % (ref 11.5–15.5)
WBC: 13.5 10*3/uL — ABNORMAL HIGH (ref 4.0–10.5)
nRBC: 0 % (ref 0.0–0.2)

## 2022-08-20 LAB — MAGNESIUM: Magnesium: 1.5 mg/dL — ABNORMAL LOW (ref 1.7–2.4)

## 2022-08-20 LAB — BASIC METABOLIC PANEL
Anion gap: 6 (ref 5–15)
BUN: 13 mg/dL (ref 6–20)
CO2: 22 mmol/L (ref 22–32)
Calcium: 8.7 mg/dL — ABNORMAL LOW (ref 8.9–10.3)
Chloride: 107 mmol/L (ref 98–111)
Creatinine, Ser: 0.73 mg/dL (ref 0.44–1.00)
GFR, Estimated: >60 mL/min (ref >60)
Glucose, Bld: 144 mg/dL — ABNORMAL HIGH (ref 70–99)
Potassium: 4 mmol/L (ref 3.5–5.1)
Sodium: 135 mmol/L (ref 135–145)

## 2022-08-20 MED ORDER — LACTATED RINGERS IV BOLUS
1000.0000 mL | Freq: Three times a day (TID) | INTRAVENOUS | Status: AC | PRN
  Administered 2022-08-20: 1000 mL via INTRAVENOUS

## 2022-08-20 MED ORDER — SODIUM CHLORIDE 0.9% FLUSH: 3.0000 mL | INTRAVENOUS | Status: DC | PRN

## 2022-08-20 MED ORDER — SODIUM CHLORIDE 0.9% FLUSH
3.0000 mL | Freq: Two times a day (BID) | INTRAVENOUS | Status: DC
  Administered 2022-08-20 – 2022-08-21 (×3): 3 mL via INTRAVENOUS

## 2022-08-20 MED ORDER — SODIUM CHLORIDE 0.9 % IV SOLN: 250.0000 mL | INTRAVENOUS | Status: DC | PRN

## 2022-08-20 MED ORDER — LACTATED RINGERS IV SOLN: INTRAVENOUS | Status: DC

## 2022-08-20 NOTE — Progress Notes (Signed)
Patient refusing ICE to surgical incision site as per order.

## 2022-08-20 NOTE — TOC Progression Note (Signed)
Transition of Care [facility]) - Progression Note    Patient Details  Name: Maria Riley MRN: [id number] Date of Birth: [birth year]  [facility] CM/SW Contact  Geni Bers, RN Phone Number: 08/20/2022, 3:14 PM  Clinical Narrative:      Transition of Care (TOC) Screening Note   Patient Details  Name: Maria Riley Date of Birth: Apr 22, 1991   Transition of Care [facility]) CM/SW Contact:    Geni Bers, RN Phone Number: 08/20/2022, 3:14 PM    Transition of Care Department [facility]) has reviewed patient and no TOC needs have been identified at this time. We will continue to monitor patient advancement through interdisciplinary progression rounds. If new patient transition needs arise, please place a TOC consult.         Expected Discharge Plan and Services                                                 Social Determinants of Health (SDOH) Interventions    Readmission Risk Interventions     No data to display

## 2022-08-20 NOTE — Progress Notes (Signed)
Maria Riley [id number] [birth year]  CARE TEAM:  PCP: [name], NP  Outpatient Care Team: Patient Care Team: [name], NP as PCP - General (Internal Medicine) Brock Bad, MD as Consulting Physician (Obstetrics and Gynecology) Karie Soda, MD as Consulting Physician (General Surgery)  Inpatient Treatment Team: Treatment Team: Attending Provider: Karie Soda, MD; Registered Nurse: Myer Haff, RN; Utilization Review: Berna Bue, RN; Registered Nurse: Ivan Anchors, RN; Technician: Misty Stanley, NT; Pharmacist: Pricilla Riffle, [facility]   Problem List:   Principal Problem:   History of closure of ileostomy   1 Day Post-Op  08/19/2022  Procedure(s): XI ROBOTIC ASSISTED END ILEOSTOMY TAKEDOWN LYSIS OF ADHESIONS    Assessment  Stable, post-operative robotic assisted end ileostomy takedown and lysis of adhesions  [facility] Stay = 1 days)  Plan: -ERAS enhanced recovery protocol -Tolerating liquids, advance diet to DYS1/fulls -Foley removed POD1, urinating appropriately -VTE prophylaxis- SCDs, Lovenox injection; plan to resume Eliquis POD2 -Mobilize as tolerated to help recovery -Updated plans and findings with patient, her husband, and nurse at bedside; language interpretation service used by Dr. Michaell Cowing during this encounter  Disposition:  To be determined.  I reviewed nursing notes, last 24 h vitals and pain scores, last 48 h intake and output, last 24 h labs and trends, and last 24 h imaging results. I have reviewed this patient's available data, including medical history, events of note, test results, etc as part of my evaluation.  A significant portion of that time was spent in counseling.  Care during the described time interval was provided by me.  This care required moderate level of medical decision making.  08/20/2022    Subjective: (Chief complaint)  Reports she is doing well Patient is ambulating in  hallways Tolerating fluids Some abdominal discomfort and bloating, but pain levels managed Foley removed Husband at bedside   Objective:  Vital signs:  Vitals:   08/20/22 0540 08/20/22 0605 08/20/22 0629 08/20/22 0635  BP: 94/66  (!) 91/54 (!) 94/58  Pulse: 77 61 (!) 55 61  Resp: 16     Temp: (!) 97.4 F (36.3 C)  (!) 97.5 F (36.4 C)   TempSrc: Oral  Oral   SpO2: 100% 100% 99% 100%  Weight:      Height:           Intake/Output   Yesterday:  09/13 0701 - 09/14 0700 In: 1956.6 [I.V.:1856.6; IV Piggyback:100] Out: 425 [Urine:325; Blood:100] This shift:  No intake/output data recorded.  Bowel function:  Flatus: No  BM:  No  Drain: (No drain)   Physical Exam:  General: Pt awake/alert in no acute distress Eyes: PERRL, normal EOM.  Sclera clear.  No icterus Neuro: CN II-XII intact w/o focal sensory/motor deficits. Lymph: No head/neck/groin lymphadenopathy Psych:  No delerium/psychosis/paranoia.  Oriented x 4 HENT: Normocephalic, Mucus membranes moist.  No thrush Neck: Supple, No tracheal deviation.  No obvious thyromegaly Chest: No pain to chest wall compression.  Good respiratory excursion.  No audible wheezing CV:  Pulses intact.  Regular rhythm.  No major extremity edema MS: Normal AROM mjr joints.  No obvious deformity  Abdomen: Soft.  Mildy distended.  Mildly tender at incisions only.  No evidence of peritonitis.  No incarcerated hernias. Dressings clean, dry, intact with some old blood visualized.  Ext:  No deformity.  No mjr edema.  No cyanosis Skin: No petechiae / purpurea.  No major sores.  Warm and dry    Results:  Cultures: No results found for this or any previous visit (from the past 720 hour(s)).  Labs: Results for orders placed or performed during the hospital encounter of 08/19/22 (from the past 48 hour(s))  Basic metabolic panel     Status: Abnormal   Collection Time: 08/20/22  5:00 AM  Result Value Ref Range   Sodium 135 135 - 145  mmol/L   Potassium 4.0 3.5 - 5.1 mmol/L   Chloride 107 98 - 111 mmol/L   CO2 22 22 - 32 mmol/L   Glucose, Bld 144 (H) 70 - 99 mg/dL    Comment: Glucose reference range applies only to samples taken after fasting for at least 8 hours.   BUN 13 6 - 20 mg/dL   Creatinine, Ser 7.48 0.44 - 1.00 mg/dL   Calcium 8.7 (L) 8.9 - 10.3 mg/dL   GFR, Estimated >27 >07 mL/min    Comment: (NOTE) Calculated using the CKD-EPI Creatinine Equation (2021)    Anion gap 6 5 - 15    Comment: Performed at [facility], [address] [place], [place] [address]  CBC     Status: Abnormal   Collection Time: 08/20/22  5:00 AM  Result Value Ref Range   WBC 13.5 (H) 4.0 - 10.5 K/uL   RBC 4.20 3.87 - 5.11 MIL/uL   Hemoglobin 12.1 12.0 - 15.0 g/dL   HCT 49.2 01.0 - 07.1 %   MCV 86.2 80.0 - 100.0 fL   MCH 28.8 26.0 - 34.0 pg   MCHC 33.4 30.0 - 36.0 g/dL   RDW 21.9 75.8 - 83.2 %   Platelets 238 150 - 400 K/uL   nRBC 0.0 0.0 - 0.2 %    Comment: Performed at [facility], [address] [place], [place] [address]  Magnesium     Status: Abnormal   Collection Time: 08/20/22  5:00 AM  Result Value Ref Range   Magnesium 1.5 (L) 1.7 - 2.4 mg/dL    Comment: Performed at [facility], [address], [place], [place] [address]    Imaging / Studies: No results found.  Medications / Allergies: per chart  Antibiotics: Anti-infectives (From admission, onward)    Start     Dose/Rate Route Frequency Ordered Stop   08/19/22 2200  cefoTEtan (CEFOTAN) 2 g in sodium chloride 0.9 % 100 mL IVPB        2 g 200 mL/hr over 30 Minutes Intravenous Every 12 hours 08/19/22 1803 08/19/22 2254   08/19/22 1115  cefoTEtan (CEFOTAN) 2 g in sodium chloride 0.9 % 100 mL IVPB        2 g 200 mL/hr over 30 Minutes Intravenous On call to O.R. 08/19/22 1110 08/19/22 1813         Note: Portions of this report may have been transcribed using voice recognition software. Every  effort was made to ensure accuracy; however, inadvertent computerized transcription errors may be present.   Any transcriptional errors that result from this process are unintentional.    Sheryle Spray, PA-S  08/20/2022  8:14 AM

## 2022-08-20 NOTE — Progress Notes (Signed)
Oncall Dr Maisie Fus called and made aware of patients BP of 88/56 MAP 66 HR 80. Last PRN bolus received at 1700. Dr Maisie Fus ordered LR continuous infusion at 25ml/hr to start now

## 2022-08-20 NOTE — TOC Progression Note (Addendum)
Transition of Care [facility]) - Progression Note    Patient Details  Name: KHALIS HITTLE MRN: [id number] Date of Birth: 11-Jul-1991  Transition of Care [facility]) CM/SW Contact  Coralyn Helling, [place] Phone Number: 08/20/2022, 9:52 AM  Clinical Narrative:     Transition of Care (TOC) Screening Note   Patient Details  Name: MAGEN SURIANO Date of Birth: 1991-10-23   Transition of Care [facility]) CM/SW Contact:    Coralyn Helling, LCSW Phone Number: 08/20/2022, 9:52 AM    Transition of Care Department [facility]) has reviewed patient and no TOC needs have been identified at this time. We will continue to monitor patient advancement through interdisciplinary progression rounds. If new patient transition needs arise, please place a TOC consult.         Expected Discharge Plan and Services                                                 Social Determinants of Health (SDOH) Interventions    Readmission Risk Interventions     No data to display

## 2022-08-21 LAB — CBC
HCT: 28.1 % — ABNORMAL LOW (ref 36.0–46.0)
Hemoglobin: 9.5 g/dL — ABNORMAL LOW (ref 12.0–15.0)
MCH: 29 pg (ref 26.0–34.0)
MCHC: 33.8 g/dL (ref 30.0–36.0)
MCV: 85.7 fL (ref 80.0–100.0)
Platelets: 208 10*3/uL (ref 150–400)
RBC: 3.28 MIL/uL — ABNORMAL LOW (ref 3.87–5.11)
RDW: 13.4 % (ref 11.5–15.5)
WBC: 9.6 10*3/uL (ref 4.0–10.5)
nRBC: 0 % (ref 0.0–0.2)

## 2022-08-21 LAB — BASIC METABOLIC PANEL
Anion gap: 4 — ABNORMAL LOW (ref 5–15)
BUN: 14 mg/dL (ref 6–20)
CO2: 27 mmol/L (ref 22–32)
Calcium: 8.4 mg/dL — ABNORMAL LOW (ref 8.9–10.3)
Chloride: 107 mmol/L (ref 98–111)
Creatinine, Ser: 0.55 mg/dL (ref 0.44–1.00)
GFR, Estimated: >60 mL/min (ref >60)
Glucose, Bld: 92 mg/dL (ref 70–99)
Potassium: 3.2 mmol/L — ABNORMAL LOW (ref 3.5–5.1)
Sodium: 138 mmol/L (ref 135–145)

## 2022-08-21 LAB — SURGICAL PATHOLOGY

## 2022-08-21 MED ORDER — POTASSIUM CHLORIDE CRYS ER 20 MEQ PO TBCR
40.0000 meq | EXTENDED_RELEASE_TABLET | Freq: Every day | ORAL | Status: DC
  Administered 2022-08-21 – 2022-08-22 (×2): 40 meq via ORAL
  Filled 2022-08-21 (×2): qty 2

## 2022-08-21 MED ORDER — TRAMADOL HCL 50 MG PO TABS: 50.0000 mg | ORAL_TABLET | Freq: Four times a day (QID) | ORAL | 0 refills | Status: DC | PRN

## 2022-08-21 MED ORDER — LACTATED RINGERS IV BOLUS
1000.0000 mL | Freq: Once | INTRAVENOUS | Status: AC
  Administered 2022-08-21: 1000 mL via INTRAVENOUS

## 2022-08-21 MED ORDER — MAGNESIUM SULFATE 2 GM/50ML IV SOLN
2.0000 g | Freq: Once | INTRAVENOUS | Status: AC
  Administered 2022-08-21: 2 g via INTRAVENOUS
  Filled 2022-08-21: qty 50

## 2022-08-21 NOTE — Progress Notes (Signed)
Pharmacy Brief Note - Alvimopan (Entereg)  The standing order set for alvimopan (Entereg) now includes an automatic order to discontinue the drug after the patient has had a bowel movement. The change was approved by the Pharmacy & Therapeutics Committee and the Medical Executive Committee.   This patient has had bowel movements documented by nursing. Therefore, alvimopan has been discontinued. If there are questions, please contact the pharmacy at [phone].   Thank you-   Adalberto Cole, PharmD, BCPS 08/21/2022 11:48 AM

## 2022-08-21 NOTE — Progress Notes (Signed)
Maria Riley [id number] 03-24-1991  CARE TEAM:  PCP: [name], NP  Outpatient Care Team: Patient Care Team: [name], NP as PCP - General (Internal Medicine) Brock Bad, MD as Consulting Physician (Obstetrics and Gynecology) Karie Soda, MD as Consulting Physician (General Surgery)  Inpatient Treatment Team: Treatment Team: Attending Provider: Karie Soda, MD; Mobility Specialist: Delight Ovens; Utilization Review: Berna Bue, RN; Registered Nurse: Diona Browner, RN; Pharmacist: Adalberto Cole, [facility]; Case Manager: Geni Bers, RN   Problem List:   Principal Problem:   History of closure of ileostomy   2 Days Post-Op  08/19/2022  Procedure(s): XI ROBOTIC ASSISTED END ILEOSTOMY TAKEDOWN LYSIS OF ADHESIONS    Assessment  Stable, postoperative robotic assisted end ileostomy takedown and lysis of adhesions.  [facility] Stay = 2 days)  Plan:  -ERAS enhanced recovery protocol -Advance to soft diet as bowel function is improving and nausea/vomiting have improved -Had incident of hypotension improved with IV fluid bolus; check CBC to ensure normal hemoglobin/hematocrit and electrolyte levels -VTE prophylaxis- SCDs, Lovenox; pending normal CBC, will resume resume Eliquis and discontinue Lovenox injections -Mobilize as tolerated to help recovery  Disposition:  Disposition:  The patient is from: Home  Anticipate discharge to:  Home  Anticipated Date of Discharge is:  08/22/2022; however, will recheck as patient may be stable enough to leave this evening pending normal labs and improvement in nausea/vomiting.   Barriers to discharge:  Pending Clinical improvement (more likely than not)  Patient currently is NOT MEDICALLY STABLE for discharge from the hospital from a surgery standpoint.      I reviewed last 24 h vitals and pain scores, last 48 h intake and output, last 24 h labs and trends, and last 24 h  imaging results. I have reviewed this patient's available data, including medical history, events of note, test results, etc as part of my evaluation.  A significant portion of that time was spent in counseling.  Care during the described time interval was provided by me.  This care required moderate level of medical decision making.  08/21/2022    Subjective: (Chief complaint)  Patient states she is doing well, she is sitting up eating breakfast.  Had some nausea and an incident of vomiting yesterday but this has improved. She involuntarily passed stool when she thought she was passing gas. States she has been walking around. Pain states pain is minimal and managed.  Spanish language interpretation services were utilized for the entire duration of this encounter.   Objective:  Vital signs:  Vitals:   08/20/22 1715 08/20/22 2118 08/20/22 2328 08/21/22 0524  BP: (!) 84/55 (!) 88/56 (!) 90/58 95/64  Pulse: 74 80 72 75  Resp: 16 16  18   Temp: 99.6 F (37.6 C) 97.7 F (36.5 C)  (!) 97.5 F (36.4 C)  TempSrc: Oral Oral  Oral  SpO2: 100% 98%  97%  Weight:      Height:        Last BM Date : 08/20/22  Intake/Output   Yesterday:  09/14 0701 - 09/15 0700 In: 2976.5 [P.O.:480; I.V.:496.5; IV Piggyback:2000] Out: 950 [Urine:750; Emesis/NG output:200] This shift:  No intake/output data recorded.  Bowel function:  Flatus: YES  BM:  YES  Drain: Serous   Physical Exam:  General: Pt awake/alert in no acute distress Eyes: PERRL, normal EOM.  Sclera clear.  No icterus Neuro: CN II-XII intact w/o focal sensory/motor deficits. Lymph: No head/neck/groin lymphadenopathy Psych:  No delerium/psychosis/paranoia.  Oriented x  4 HENT: Normocephalic, Mucus membranes moist.  No thrush Neck: Supple, No tracheal deviation.  No obvious thyromegaly Chest: No pain to chest wall compression.  Good respiratory excursion.  No audible wheezing CV:  Pulses intact.  Regular rhythm.  No major  extremity edema MS: Normal AROM mjr joints.  No obvious deformity  Abdomen: Soft.  Nondistended.  Mildly tender at incisions only.  No evidence of peritonitis.  No incarcerated hernias.  Ext:  No deformity.  No mjr edema.  No cyanosis Skin: No petechiae / purpurea.  No major sores.  Warm and dry    Results:   Cultures: No results found for this or any previous visit (from the past 720 hour(s)).  Labs: Results for orders placed or performed during the hospital encounter of 08/19/22 (from the past 48 hour(s))  Basic metabolic panel     Status: Abnormal   Collection Time: 08/20/22  5:00 AM  Result Value Ref Range   Sodium 135 135 - 145 mmol/L   Potassium 4.0 3.5 - 5.1 mmol/L   Chloride 107 98 - 111 mmol/L   CO2 22 22 - 32 mmol/L   Glucose, Bld 144 (H) 70 - 99 mg/dL    Comment: Glucose reference range applies only to samples taken after fasting for at least 8 hours.   BUN 13 6 - 20 mg/dL   Creatinine, Ser 0.35 0.44 - 1.00 mg/dL   Calcium 8.7 (L) 8.9 - 10.3 mg/dL   GFR, Estimated >00 >93 mL/min    Comment: (NOTE) Calculated using the CKD-EPI Creatinine Equation (2021)    Anion gap 6 5 - 15    Comment: Performed at [facility], [address] [place], [place] [address]  CBC     Status: Abnormal   Collection Time: 08/20/22  5:00 AM  Result Value Ref Range   WBC 13.5 (H) 4.0 - 10.5 K/uL   RBC 4.20 3.87 - 5.11 MIL/uL   Hemoglobin 12.1 12.0 - 15.0 g/dL   HCT 93.7 16.9 - 67.8 %   MCV 86.2 80.0 - 100.0 fL   MCH 28.8 26.0 - 34.0 pg   MCHC 33.4 30.0 - 36.0 g/dL   RDW 93.8 10.1 - 75.1 %   Platelets 238 150 - 400 K/uL   nRBC 0.0 0.0 - 0.2 %    Comment: Performed at [facility], [address] [place], [place] [address]  Magnesium     Status: Abnormal   Collection Time: 08/20/22  5:00 AM  Result Value Ref Range   Magnesium 1.5 (L) 1.7 - 2.4 mg/dL    Comment: Performed at [facility], [address], [place], [place]  [address]    Imaging / Studies: No results found.  Medications / Allergies: per chart  Antibiotics: Anti-infectives (From admission, onward)    Start     Dose/Rate Route Frequency Ordered Stop   08/19/22 2200  cefoTEtan (CEFOTAN) 2 g in sodium chloride 0.9 % 100 mL IVPB        2 g 200 mL/hr over 30 Minutes Intravenous Every 12 hours 08/19/22 1803 08/19/22 2254   08/19/22 1115  cefoTEtan (CEFOTAN) 2 g in sodium chloride 0.9 % 100 mL IVPB        2 g 200 mL/hr over 30 Minutes Intravenous On call to O.R. 08/19/22 1110 08/19/22 1813         Note: Portions of this report may have been transcribed using voice recognition software. Every effort was made  to ensure accuracy; however, inadvertent computerized transcription errors may be present.   Any transcriptional errors that result from this process are unintentional.    Sheryle Spray, PA-S  08/21/2022  8:35 AM

## 2022-08-22 LAB — BASIC METABOLIC PANEL
Anion gap: 4 — ABNORMAL LOW (ref 5–15)
BUN: 7 mg/dL (ref 6–20)
CO2: 28 mmol/L (ref 22–32)
Calcium: 7.9 mg/dL — ABNORMAL LOW (ref 8.9–10.3)
Chloride: 104 mmol/L (ref 98–111)
Creatinine, Ser: 0.4 mg/dL — ABNORMAL LOW (ref 0.44–1.00)
GFR, Estimated: >60 mL/min (ref >60)
Glucose, Bld: 74 mg/dL (ref 70–99)
Potassium: 3.6 mmol/L (ref 3.5–5.1)
Sodium: 136 mmol/L (ref 135–145)

## 2022-08-22 LAB — CBC
HCT: 26.4 % — ABNORMAL LOW (ref 36.0–46.0)
Hemoglobin: 9 g/dL — ABNORMAL LOW (ref 12.0–15.0)
MCH: 28.9 pg (ref 26.0–34.0)
MCHC: 34.1 g/dL (ref 30.0–36.0)
MCV: 84.9 fL (ref 80.0–100.0)
Platelets: 207 10*3/uL (ref 150–400)
RBC: 3.11 MIL/uL — ABNORMAL LOW (ref 3.87–5.11)
RDW: 13.4 % (ref 11.5–15.5)
WBC: 9.7 10*3/uL (ref 4.0–10.5)
nRBC: 0 % (ref 0.0–0.2)

## 2022-08-22 NOTE — Progress Notes (Signed)
Reviewed written d/c instructions9in spanish) via [place] interpreter w pt and all questions answered. She verbalized understanding. D/C via w/c w all belongings in stable condition.

## 2022-08-22 NOTE — Discharge Summary (Signed)
    Physician Discharge Summary   Patient ID: Maria Riley MRN: [id number] DOB/AGE: [birth year] 31 y.o.  Admit date: 08/19/2022  Discharge date: 08/22/2022  Discharge Diagnoses:  Principal Problem:   History of closure of ileostomy   Discharged Condition: good  Hospital Course: Patient was admitted for observation following ileostomy takedown, robotic.  Post op course was uncomplicated.  Pain was well controlled.  Tolerated diet.  Patient was prepared for discharge home on POD#3.  Consults: None  Treatments: surgery: end ileostomy takedown, robotic  Discharge Exam: Blood pressure 98/71, pulse 72, temperature 98.5 F (36.9 C), temperature source Oral, resp. rate 18, height 4\' 11"  (1.499 m), weight 58.3 kg, last menstrual period 06/08/2021, SpO2 100 %. HEENT - clear Neck - soft Abd - dressings intact; soft without distension; mild tenderness  Disposition: Home  Discharge Instructions     Diet - low sodium heart healthy   Complete by: As directed    Discharge wound care:   Complete by: As directed    Remove all dressings.  Nurse to remove wicks from ostomy site wound and then cover site with dry gauze dressing.   Increase activity slowly   Complete by: As directed    Increase activity slowly   Complete by: As directed       Allergies as of 08/22/2022       Reactions   Tape    Adhesive tape- rash        Medication List     STOP taking these medications    amoxicillin 875 MG tablet Commonly known as: AMOXIL   fluconazole 150 MG tablet Commonly known as: Diflucan   pantoprazole 40 MG tablet Commonly known as: PROTONIX       TAKE these medications    apixaban 5 MG Tabs tablet Commonly known as: ELIQUIS Take 1 tablet (5 mg total) by mouth 2 (two) times daily.   levothyroxine 25 MCG tablet Commonly known as: SYNTHROID Take 1 tablet (25 mcg total) by mouth daily.   traMADol 50 MG tablet Commonly known as: ULTRAM Take 1-2 tablets (50-100 mg  total) by mouth every 6 (six) hours as needed for moderate pain.               Discharge Care Instructions  (From admission, onward)           Start     Ordered   08/22/22 0000  Discharge wound care:       Comments: Remove all dressings.  Nurse to remove wicks from ostomy site wound and then cover site with dry gauze dressing.   08/22/22 1150            Follow-up Information     Michael Boston, MD. Schedule an appointment as soon as possible for a visit in 3 week(s).   Specialties: General Surgery, Colon and Rectal Surgery Why: To follow up after your operation Contact information: [place] [address] [phone]                 Deniya Craigo, [facility] Office: [phone]   Signed: Armandina Gemma 08/22/2022, 11:52 AM

## 2022-08-24 ENCOUNTER — Telehealth: Payer: Self-pay

## 2022-08-24 NOTE — Telephone Encounter (Signed)
Transition Care Management Unsuccessful Follow-up Telephone Call  Call placed with assistance of Spanish Interpreter [id number]/[employer]  Date of discharge and from where:  08/22/2022, [facility]   Attempts:  1st Attempt  Reason for unsuccessful TCM follow-up call:  Left voice message  [phone] , call back requested

## 2022-08-25 ENCOUNTER — Telehealth: Payer: Self-pay

## 2022-08-25 NOTE — Telephone Encounter (Signed)
Transition Care Management Unsuccessful Follow-up Telephone Call  Call placed with assistance of Spanish Interpreter [id number]/[employer] Date of discharge and from where:  08/22/2022, [facility]  Attempts:  2nd Attempt  Reason for unsuccessful TCM follow-up call:  Left voice message - call placed to [phone], a person answered and then hung up after the interpreter introduced herself.   The interpreter then called back and left a message requesting the patient call me back.

## 2022-08-26 ENCOUNTER — Telehealth: Payer: Self-pay

## 2022-08-26 NOTE — Telephone Encounter (Signed)
Transition Care Management Follow-up Telephone Call  Call completed with assistance of Spanish Interpreter [id number]/[employer] Date of discharge and from where: 08/22/2022, [facility]  How have you been since you were released from the hospital? She said she has some pain and colic but she is fine. She stated that she wants to recover fast.  Any questions or concerns? No  Items Reviewed: Did the pt receive and understand the discharge instructions provided? Yes  Medications obtained and verified? Yes - she said she has her medications and did not have any questions about the med regime.  Other? No  Any new allergies since your discharge? No  Dietary orders reviewed? No Do you have support at home? Yes   Home Care and Equipment/Supplies: Were home health services ordered? no If so, what is the name of the agency? N/a  Has the agency set up a time to come to the patient's home? not applicable Were any new equipment or medical supplies ordered?  No What is the name of the medical supply agency? N/a Were you able to get the supplies/equipment? not applicable Do you have any questions related to the use of the equipment or supplies? No  Functional Questionnaire: (I = Independent and D = Dependent) ADLs: independent. She said she just has gauze over the surgical site.     Follow up appointments reviewed:  PCP Hospital f/u appt confirmed?  She will call to schedule an appointment  She just saw Ms Oletta Lamas, NP on 08/13/2022.  [facility] f/u appt confirmed? Yes  Scheduled to see surgeon - 09/14/2022.  Are transportation arrangements needed? No  If their condition worsens, is the pt aware to call PCP or go to the Emergency Dept.? Yes Was the patient provided with contact information for the PCP's office or ED? Yes Was to pt encouraged to call back with questions or concerns? Yes

## 2022-09-11 ENCOUNTER — Other Ambulatory Visit (INDEPENDENT_AMBULATORY_CARE_PROVIDER_SITE_OTHER): Payer: Self-pay | Admitting: Primary Care

## 2022-09-11 NOTE — Telephone Encounter (Signed)
Requested Prescriptions  Pending Prescriptions Disp Refills  . levothyroxine (SYNTHROID) 25 MCG tablet [Pharmacy Med Name: LEVOTHYROXINE 25 MCG TABLET] 90 tablet 0    Sig: TAKE 1 TABLET BY MOUTH EVERY DAY     Endocrinology:  Hypothyroid Agents Passed - 09/11/2022  2:38 PM      Passed - TSH in normal range and within 360 days    TSH  Date Value Ref Range Status  06/04/2022 1.410 0.450 - 4.500 uIU/mL Final         Passed - Valid encounter within last 12 months    Recent Outpatient Visits          4 weeks ago Acute tonsillitis due to other specified organisms   [place], Michelle P, NP   3 months ago Hair loss disorder   [place], Michelle P, NP   4 months ago DIC (disseminated intravascular coagulation) ([place])   [place], Michelle P, NP   5 months ago Urinary frequency   [place], Michelle P, NP   6 months ago Hospital discharge follow-up   [place] Kerin Perna, NP

## 2022-09-21 ENCOUNTER — Ambulatory Visit (INDEPENDENT_AMBULATORY_CARE_PROVIDER_SITE_OTHER): Payer: Self-pay | Admitting: *Deleted

## 2022-09-21 ENCOUNTER — Encounter (INDEPENDENT_AMBULATORY_CARE_PROVIDER_SITE_OTHER): Payer: Self-pay | Admitting: Primary Care

## 2022-09-21 ENCOUNTER — Ambulatory Visit (INDEPENDENT_AMBULATORY_CARE_PROVIDER_SITE_OTHER): Payer: Medicaid Other | Admitting: Primary Care

## 2022-09-21 VITALS — BP 91/66 | HR 63 | Ht 59.0 in | Wt 120.0 lb

## 2022-09-21 DIAGNOSIS — H9201 Otalgia, right ear: Secondary | ICD-10-CM

## 2022-09-21 DIAGNOSIS — B078 Other viral warts: Secondary | ICD-10-CM

## 2022-09-21 NOTE — Telephone Encounter (Signed)
  Chief Complaint: ear pain Symptoms: R ear pain Frequency: started yesterday Pertinent Negatives: Patient denies fever, congestion Disposition: [] ED /[] Urgent Care (no appt availability in office) / [x] Appointment(In office/virtual)/ []  [place] [facility]/ [] Home Care/ [] Refused Recommended Disposition /[] [place] [facility]/ []  Follow-up with PCP Additional Notes:

## 2022-09-21 NOTE — Progress Notes (Unsigned)
[facility]  Maria Riley, is a 31 y.o. female  QHU:[id number]  [id number]  DOB - 1991/08/21  Chief Complaint  Patient presents with   Ear Pain    Started yesterday Right ear       Subjective:   Maria Riley is a 31 y.o. female here today for a right ear pain that started yesterday. Concern with a painful wart right middle finger.  Interpreter Larene Pickett [id number]. Patient has No headache, No chest pain, No abdominal pain - No Nausea, No new weakness tingling or numbness, No Cough - shortness of breath  No problems updated.  Allergies  Allergen Reactions   Tape     Adhesive tape- rash    Past Medical History:  Diagnosis Date   Anemia    Medical history non-contributory    Pulmonary embolism (HCC)     Current Outpatient Medications on File Prior to Visit  Medication Sig Dispense Refill   levothyroxine (SYNTHROID) 25 MCG tablet TAKE 1 TABLET BY MOUTH EVERY DAY 90 tablet 0   apixaban (ELIQUIS) 5 MG TABS tablet Take 1 tablet (5 mg total) by mouth 2 (two) times daily. 60 tablet 2   traMADol (ULTRAM) 50 MG tablet Take 1-2 tablets (50-100 mg total) by mouth every 6 (six) hours as needed for moderate pain. (Patient not taking: Reported on 09/21/2022) 20 tablet 0   [DISCONTINUED] fluticasone (FLONASE) 50 MCG/ACT nasal spray Place 1 spray into both nostrils daily. 16 g 0   [DISCONTINUED] LO LOESTRIN FE 1 MG-10 MCG / 10 MCG tablet Take 1 tablet by mouth daily. 1 Package 11   [DISCONTINUED] medroxyPROGESTERone (DEPO-PROVERA) 150 MG/ML injection Inject 1 mL (150 mg total) into the muscle every 3 (three) months. (Patient not taking: Reported on 11/14/2018) 1 mL 4   No current facility-administered medications on file prior to visit.    Objective:   Vitals:   09/21/22 1152  BP: 91/66  Pulse: 63  SpO2: 99%  Weight: 120 lb (54.4 kg)  Height: 4\' 11"  (1.499 m)    Exam General appearance : Awake, alert, not in any distress. Speech Clear. Not toxic  looking HEENT: Atraumatic and Normocephalic, pupils equally reactive to light and accomodation Neck: Supple, no JVD. No cervical lymphadenopathy.  Chest: Good air entry bilaterally, no added sounds  CVS: S1 S2 regular, no murmurs.  Abdomen: Bowel sounds present, Non tender and not distended with no gaurding, rigidity or rebound. Extremities: wart right middle finger. B/L Lower Ext shows no edema, both legs are warm to touch Neurology: Awake alert, and oriented X 3,  Non focal Skin: No Rash  Data Review Lab Results  Component Value Date   HGBA1C 4.8 08/14/2022   HGBA1C 4.8 01/14/2018   HGBA1C 4.7 (L) 03/22/2017    Assessment & Plan  [name] was seen today for ear pain.  Diagnoses and all orders for this visit:  Other viral warts -     Ambulatory referral to Dermatology  Ear pain, right No redness TM not bulging or hearing loss. No visual problem. OTC sweet oil if no improvement will refer to ENT   There are no diagnoses linked to this encounter.   Patient have been counseled extensively about nutrition and exercise. Other issues discussed during this visit include: low cholesterol diet, weight control and daily exercise, foot care, annual eye examinations at Ophthalmology, importance of adherence with medications and regular follow-up. We also discussed long term complications of uncontrolled diabetes and hypertension.   Return if symptoms worsen or  fail to improve.  The patient was given clear instructions to go to ER or return to medical center if symptoms don't improve, worsen or new problems develop. The patient verbalized understanding. The patient was told to call to get lab results if they haven't heard anything in the next week.   This note has been created with Surveyor, quantity. Any transcriptional errors are unintentional.   Kerin Perna, NP 09/22/2022, 5:16 PM

## 2022-09-21 NOTE — Patient Instructions (Signed)
aceite dulce El aceite dulce es bueno para el dolor de odo? El aceite dulce tambin es popular por sus propiedades que pueden ofrecer alivio para los dolores de odo y las infecciones menores de odo. Sus propiedades Animal nutritionist, Youth worker antiinflamatorios y accin antibacteriana contribuyen potencialmente a Public house manager las molestias relacionadas con el odo.

## 2022-09-21 NOTE — Telephone Encounter (Signed)
Reason for Disposition  Earache  (Exceptions: brief ear pain of < 60 minutes duration, earache occurring during air travel  Answer Assessment - Initial Assessment Questions 1. LOCATION: "Which ear is involved?"     right 2. ONSET: "When did the ear start hurting"      yesterday 3. SEVERITY: "How bad is the pain?"  (Scale 1-10; mild, moderate or severe)   - MILD (1-3): doesn't interfere with normal activities    - MODERATE (4-7): interferes with normal activities or awakens from sleep    - SEVERE (8-10): excruciating pain, unable to do any normal activities      moderate 4. URI SYMPTOMS: "Do you have a runny nose or cough?"     no 5. FEVER: "Do you have a fever?" If Yes, ask: "What is your temperature, how was it measured, and when did it start?"     no 6. CAUSE: "Have you been swimming recently?", "How often do you use Q-TIPS?", "Have you had any recent air travel or scuba diving?"     unsure 7. OTHER SYMPTOMS: "Do you have any other symptoms?" (e.g., headache, stiff neck, dizziness, vomiting, runny nose, decreased hearing)     no 8. PREGNANCY: "Is there any chance you are pregnant?" "When was your last menstrual period?"     *No Answer*  Protocols used: Bethann Punches

## 2022-10-01 DIAGNOSIS — Z0001 Encounter for general adult medical examination with abnormal findings: Secondary | ICD-10-CM | POA: Diagnosis not present

## 2022-10-01 DIAGNOSIS — Z0000 Encounter for general adult medical examination without abnormal findings: Secondary | ICD-10-CM | POA: Diagnosis not present

## 2022-10-01 DIAGNOSIS — Z6823 Body mass index (BMI) 23.0-23.9, adult: Secondary | ICD-10-CM | POA: Diagnosis not present

## 2022-10-01 DIAGNOSIS — N9412 Deep dyspareunia: Secondary | ICD-10-CM | POA: Diagnosis not present

## 2022-10-01 DIAGNOSIS — Z113 Encounter for screening for infections with a predominantly sexual mode of transmission: Secondary | ICD-10-CM | POA: Diagnosis not present

## 2022-10-01 DIAGNOSIS — N898 Other specified noninflammatory disorders of vagina: Secondary | ICD-10-CM | POA: Diagnosis not present

## 2022-10-22 DIAGNOSIS — D2360 Other benign neoplasm of skin of unspecified upper limb, including shoulder: Secondary | ICD-10-CM | POA: Diagnosis not present

## 2022-10-31 DIAGNOSIS — D239 Other benign neoplasm of skin, unspecified: Secondary | ICD-10-CM | POA: Diagnosis not present

## 2022-12-03 ENCOUNTER — Other Ambulatory Visit (INDEPENDENT_AMBULATORY_CARE_PROVIDER_SITE_OTHER): Payer: Self-pay | Admitting: Primary Care

## 2022-12-03 NOTE — Telephone Encounter (Signed)
Requested Prescriptions  Pending Prescriptions Disp Refills   levothyroxine (SYNTHROID) 25 MCG tablet [Pharmacy Med Name: LEVOTHYROXINE 25 MCG TABLET] 90 tablet 0    Sig: TAKE 1 TABLET BY MOUTH EVERY DAY     Endocrinology:  Hypothyroid Agents Passed - 12/03/2022 11:31 AM      Passed - TSH in normal range and within 360 days    TSH  Date Value Ref Range Status  06/04/2022 1.410 0.450 - 4.500 uIU/mL Final         Passed - Valid encounter within last 12 months    Recent Outpatient Visits           2 months ago Other viral warts   [facility] Gwinda Passe P, NP   3 months ago Acute tonsillitis due to other specified organisms   [facility] [name], NP   6 months ago Hair loss disorder   [facility] [name], NP   7 months ago DIC (disseminated intravascular coagulation) (HCC)   [facility] [name], NP   8 months ago Urinary frequency   [facility] [name], NP

## 2022-12-09 ENCOUNTER — Ambulatory Visit (HOSPITAL_COMMUNITY): Payer: Medicaid Other

## 2022-12-23 DIAGNOSIS — Z789 Other specified health status: Secondary | ICD-10-CM | POA: Diagnosis not present

## 2022-12-23 DIAGNOSIS — R1030 Lower abdominal pain, unspecified: Secondary | ICD-10-CM | POA: Diagnosis not present

## 2022-12-25 ENCOUNTER — Other Ambulatory Visit: Payer: Self-pay | Admitting: Surgery

## 2022-12-25 DIAGNOSIS — R109 Unspecified abdominal pain: Secondary | ICD-10-CM

## 2023-01-01 ENCOUNTER — Ambulatory Visit (INDEPENDENT_AMBULATORY_CARE_PROVIDER_SITE_OTHER): Payer: Self-pay | Admitting: *Deleted

## 2023-01-01 NOTE — Telephone Encounter (Signed)
Summary: Scheduling   Patient is experiencing headaches. Earliest appointment was for 01/14/2023. Please assist patient further.      InterpreterCarmelina Peal # [id number] Reason for Disposition . [1] MODERATE headache (e.g., interferes with normal activities) AND [2] present > 24 hours AND [3] unexplained  (Exceptions: analgesics not tried, typical migraine, or headache part of viral illness)  Answer Assessment - Initial Assessment Questions 1. LOCATION: "Where does it hurt?"      Back of neck to skull, behind eyes too 2. ONSET: "When did the headache start?" (Minutes, hours or days)      Few months ago 3. PATTERN: "Does the pain come and go, or has it been constant since it started?"     Often-last headache Wednesday, Saturday woke with dizziness 4. SEVERITY: "How bad is the pain?" and "What does it keep you from doing?"  (e.g., Scale 1-10; mild, moderate, or severe)   - MILD (1-3): doesn't interfere with normal activities    - MODERATE (4-7): interferes with normal activities or awakens from sleep    - SEVERE (8-10): excruciating pain, unable to do any normal activities        Tylenol not helping- 6-7 5. RECURRENT SYMPTOM: "Have you ever had headaches before?" If Yes, ask: "When was the last time?" and "What happened that time?"      no 6. CAUSE: "What do you think is causing the headache?"     Last year patient had blood loss- pulmonary embolism 7. MIGRAINE: "Have you been diagnosed with migraine headaches?" If Yes, ask: "Is this headache similar?"      no 8. HEAD INJURY: "Has there been any recent injury to the head?"      no 9. OTHER SYMPTOMS: "Do you have any other symptoms?" (fever, stiff neck, eye pain, sore throat, cold symptoms)     Pain behind the eyes  Protocols used: Headache-A-AH

## 2023-01-01 NOTE — Telephone Encounter (Signed)
  Chief Complaint: headache Symptoms: frequent headaches, dizziness Frequency: more frequent- tylenol not helping Pertinent Negatives: Patient denies   Disposition: [] ED /[x] Urgent Care (no appt availability in office) / [] Appointment(In office/virtual)/ []  [place] [facility]/ [] Home Care/ [] Refused Recommended Disposition /[] [place] [facility]/ []  Follow-up with PCP Additional Notes: Patient advised UC for evaluation- no open appointment

## 2023-01-04 NOTE — Telephone Encounter (Signed)
NOTED

## 2023-01-06 ENCOUNTER — Ambulatory Visit
Admission: RE | Admit: 2023-01-06 | Discharge: 2023-01-06 | Disposition: A | Discharge status: Home / Self Care | Payer: Medicaid Other | Source: Ambulatory Visit | Attending: Surgery | Admitting: Surgery

## 2023-01-06 DIAGNOSIS — R109 Unspecified abdominal pain: Secondary | ICD-10-CM

## 2023-01-06 MED ORDER — IOPAMIDOL (ISOVUE-300) INJECTION 61%
100.0000 mL | Freq: Once | INTRAVENOUS | Status: AC | PRN
  Administered 2023-01-06: 100 mL via INTRAVENOUS

## 2023-01-14 ENCOUNTER — Ambulatory Visit (INDEPENDENT_AMBULATORY_CARE_PROVIDER_SITE_OTHER): Payer: Medicaid Other | Admitting: Primary Care

## 2023-01-14 ENCOUNTER — Encounter (INDEPENDENT_AMBULATORY_CARE_PROVIDER_SITE_OTHER): Payer: Self-pay | Admitting: Primary Care

## 2023-01-14 VITALS — BP 93/59 | HR 65 | Resp 16 | Wt 123.0 lb

## 2023-01-14 DIAGNOSIS — G44209 Tension-type headache, unspecified, not intractable: Secondary | ICD-10-CM | POA: Diagnosis not present

## 2023-01-14 DIAGNOSIS — D649 Anemia, unspecified: Secondary | ICD-10-CM

## 2023-01-14 DIAGNOSIS — Z432 Encounter for attention to ileostomy: Secondary | ICD-10-CM | POA: Diagnosis not present

## 2023-01-14 DIAGNOSIS — I959 Hypotension, unspecified: Secondary | ICD-10-CM | POA: Diagnosis not present

## 2023-01-14 DIAGNOSIS — E039 Hypothyroidism, unspecified: Secondary | ICD-10-CM

## 2023-01-14 MED ORDER — IBUPROFEN 600 MG PO TABS: 600.0000 mg | ORAL_TABLET | Freq: Three times a day (TID) | ORAL | 0 refills | Status: DC | PRN

## 2023-01-14 NOTE — Patient Instructions (Signed)
Hipotensin Hypotension Cuando el corazn late, bombea la sangre a travs del cuerpo. La hipotensin, conocida comnmente como presin arterial baja, se produce cuando la fuerza de la sangre que se bombea en las arterias es demasiado dbil. Las arterias son vasos sanguneos que transportan la sangre desde el corazn al resto del cuerpo. Segn la causa y la gravedad, la hipotensin puede ser inofensiva (benigna) o puede causar problemas graves (crtica). Cuando la presin arterial es demasiado baja, puede ser que no llegue suficiente sangre al cerebro o al resto de los rganos. Esto puede causar debilidad, vahdos, latidos cardacos acelerados y [place]. Cules son las causas? Esta afeccin puede ser causada por lo siguiente: Prdida de sangre. Prdida de lquidos corporales (deshidratacin). Problemas cardacos. Problemas hormonales (endocrinos). Embarazo. Infeccin grave. Falta de ciertos nutrientes. Reacciones alrgicas graves (anafilaxis). Ciertos medicamentos, como medicamentos para la presin arterial o medicamentos que hacen que el cuerpo pierda el exceso de lquidos (diurticos). A veces, la causa de la hipotensin puede ser no tomar los medicamentos segn lo indicado, por ejemplo, tomar demasiada cantidad de [employer]. Qu incrementa el riesgo? Los siguientes factores pueden hacer que sea ms propenso a Armed forces training and education officer afeccin: Edad. El riesgo aumenta a medida que envejece. Una afeccin que afecta el corazn o el sistema nervioso central. Cules son los signos o sntomas? Los sntomas frecuentes de esta afeccin [employer] siguientes: Debilidad. Desvanecimiento. Mareos. Visin borrosa. Cansancio (fatiga). Latidos cardacos rpidos. Desmayos, cuando los casos son graves. Cmo se diagnostica? Esta afeccin se diagnostica en funcin de lo siguiente: Sus antecedentes mdicos. Sus sntomas. La medicin de la presin arterial. El mdico le controlar la presin  arterial cuando usted est: Acostado. Sentado. De pie. La lectura de la presin arterial se registra con dos nmeros, por ejemplo "120 sobre 80" (o 120/80). El primer nmero ("superior") es la presin sistlica. Es la medida de la presin de las arterias cuando el corazn late. El segundo nmero ("inferior") es la presin diastlica. Es la medida de la presin en las arterias cuando el corazn se relaja entre latidos. La presin arterial se mide en una unidad llamada mm Hg. Una presin arterial saludable para la [employer] es de 120/80. Es posible que se le diagnostique hipotensin si su presin arterial est por debajo de 90/60. Entre los estudios e informacin que pueden ayudar a Nurse, children's hipotensin se incluyen los siguientes: Sus otros signos vitales, por ejemplo la frecuencia cardaca y Therapist, sports. Anlisis de [place]. Prueba de basculacin. Para realizar esta prueba, se lo sujeta de forma segura a una mesa que lo mover de una posicin [place] a una posicin vertical. Durante la prueba, se controlarn el ritmo cardaco y la presin arterial. Cmo se trata? El tratamiento de esta afeccin puede incluir lo siguiente: Cambios en la dieta. Esto puede implicar beber ms agua o aumentar la ingesta de sal (sodio) al comer alimentos con alto contenido de sal. Medicamentos para elevar la presin arterial. Cambiar la dosis de ciertos medicamentos que toma y que podran bajar su presin arterial. Usar medias de compresin. Estas medias ayudan a Mining engineer formacin de cogulos de [place] y a reducir la hinchazn de las piernas. En algunos casos, puede ser necesario ir al hospital para: Reposicin de lquidos. Esto implica que recibir lquidos por una va intravenosa. Reposicin de sangre. Este es un procedimiento en el cual recibir sangre de un donante a travs de una va intravenosa (transfusin). El tratamiento de una infeccin o problemas cardacos, si corresponde. Control.  Necesitar supervisin [employer] de los medicamentos que est tomando. Siga estas instrucciones en su casa: Comida y bebida  Beba suficiente lquido como para Theatre manager la orina de color amarillo plido. Mantenga una dieta saludable y siga las instrucciones del mdico respecto de las restricciones para las comidas o las bebidas. Una dieta saludable incluye: Frutas y verduras frescas. Cereales integrales. Carnes magras. Productos lcteos con bajo contenido de [place]. Aumente la ingesta de sal si se lo indican. No agregue sal adicional a su dieta si no se lo indica el mdico. Haga comidas pequeas y frecuentes. Evite ponerse de pie de repente despus de comer. Medicamentos Use los medicamentos de venta libre y los recetados solamente como se lo haya indicado el [place] instrucciones del mdico respecto del cambio de la dosis de sus medicamentos actuales, si corresponde. No deje de tomar los medicamentos ni modifique la dosis por su cuenta. Instrucciones generales  Use medias de compresin como se lo haya indicado su mdico. Levntese despacio cuando est acostado o sentado. Esto posibilitar que la presin arterial se adapte. Evite las duchas calientes o el calor excesivo segn las indicaciones del mdico. Reanude sus actividades normales segn lo indicado por el mdico. Pregntele al mdico qu actividades son seguras para usted. No consuma ningn producto que contenga nicotina o tabaco. Estos productos incluyen cigarrillos, tabaco para Higher education careers adviser y aparatos de vapeo, como los Psychologist, sport and exercise. Si necesita ayuda para dejar de fumar, consulte al mdico. Concurra a todas las visitas de seguimiento. Esto es importante. Comunquese con un mdico si: Vomita. Tiene diarrea. Tiene fiebre por ms de 2 a 3 das. Tiene ms sed que lo habitual. Se siente dbil y cansado. Solicite ayuda de inmediato si: Electronics engineer. Tiene latidos cardacos rpidos o  irregulares. Siente adormecimiento en alguna parte del cuerpo. No puede mover los brazos ni las piernas. Tiene dificultad para hablar. Est sudoroso o siente mareos. Se desmaya. Le falta el aire. Tiene dificultad para mantenerse despierto. Se siente confundido. Estos sntomas pueden Sales executive. Solicite ayuda de inmediato. Llame al 911. No espere a ver si los sntomas desaparecen. No conduzca por sus propios medios [employer]. Resumen La hipotensin se produce cuando la fuerza de la sangre que se bombea en las arterias es demasiado dbil. La hipotensin puede ser inofensiva (benigna) o puede causar problemas graves (ser crtica). El tratamiento de esta afeccin puede incluir cambios en la alimentacin, cambios de los medicamentos y el uso de medias de compresin. En algunos casos, puede ser necesario ir al hospital para que le repongan lquidos o [place]. Esta informacin no tiene Marine scientist el consejo del mdico. Asegrese de hacerle al mdico cualquier pregunta que tenga. Document Revised: 08/06/2021 Document Reviewed: 08/06/2021 Elsevier Patient Education  [place].

## 2023-01-14 NOTE — Progress Notes (Signed)
[facility]        SUBJECTIVE: Maria Riley is a 32 y.o. Hispanic female ( interpretor Ilda Mori [phone])  who complains of headaches for 2 month(s). Description of pain: sharp pain, bilateral in the frontal area, bilateral in the temporal area. Duration of individual headaches: all day , stays in the shower a hour for some relief.  day(s), frequency frequently. Headaches causing insomnia.  Associated symptoms: dizzy and nausea. Pain relief: acetaminophen and ibuprofen. Not helping.  Precipitating factors: patient is aware of none. She denies a history of recent head injury.  Prior neurological history: negative for no neurological problems. Neurologic Review of Systems - headaches described as sharp continuous .  Current Outpatient Medications on File Prior to Visit  Medication Sig Dispense Refill   levothyroxine (SYNTHROID) 25 MCG tablet TAKE 1 TABLET BY MOUTH EVERY DAY 90 tablet 0   apixaban (ELIQUIS) 5 MG TABS tablet Take 1 tablet (5 mg total) by mouth 2 (two) times daily. 60 tablet 2   traMADol (ULTRAM) 50 MG tablet Take 1-2 tablets (50-100 mg total) by mouth every 6 (six) hours as needed for moderate pain. (Patient not taking: Reported on 09/21/2022) 20 tablet 0   [DISCONTINUED] fluticasone (FLONASE) 50 MCG/ACT nasal spray Place 1 spray into both nostrils daily. 16 g 0   [DISCONTINUED] LO LOESTRIN FE 1 MG-10 MCG / 10 MCG tablet Take 1 tablet by mouth daily. 1 Package 11   [DISCONTINUED] medroxyPROGESTERone (DEPO-PROVERA) 150 MG/ML injection Inject 1 mL (150 mg total) into the muscle every 3 (three) months. (Patient not taking: Reported on 11/14/2018) 1 mL 4   No current facility-administered medications on file prior to visit.   Past Medical History:  Diagnosis Date   Anemia    Medical history non-contributory    Pulmonary embolism ([place])       Comprehensive ROS Pertinent positive and negative noted in HPI    OBJECTIVE: Appearance: alert, well appearing,  and in no distress, oriented to person, place, and time, and normal appearing weight. Neurological Exam: alert, oriented, normal speech, no focal findings or movement disorder noted. Physical exam: General: Vital signs reviewed.  Patient is well-developed and well-nourished, in no acute distress and cooperative with exam. Head: Normocephalic and atraumatic. Eyes: EOMI, conjunctivae normal, no scleral icterus. Neck: Supple, trachea midline, normal ROM, no JVD, masses, thyromegaly, or carotid bruit present. Cardiovascular: RRR, S1 normal, S2 normal, no murmurs, gallops, or rubs. Pulmonary/Chest: Clear to auscultation bilaterally, no wheezes, rales, or rhonchi. Abdominal: Soft,-tender, swollen and small ball felt Musculoskeletal: No joint deformities, erythema, or stiffness, ROM full and nontender. Extremities: No lower extremity edema bilaterally,  pulses symmetric and intact bilaterally. No cyanosis or clubbing. Neurological: A&O x3, Strength is normal Skin: Warm, dry and intact. No rashes or erythema. Psychiatric: Normal mood and affect. speech and behavior is normal. Cognition and memory are normal.    Ellah was seen today for headache.  Diagnoses and all orders for this visit:  Hypothyroidism, unspecified type -     TSH + free T4; Future  Anemia, unspecified type -     CBC with Differential/Platelet; Future  Hypotension, unspecified hypotension type  Information on AVS  Referred to cardiology and checking CBC    Acute non intractable tension-type headache  tension headache.-Recommendations: lie in darkened room and apply cold packs prn for pain and asked to keep headache diary  This note has been created with Surveyor, quantity. Any transcriptional  errors are unintentional.   Kerin Perna, NP 01/14/2023, 4:55 PM

## 2023-01-15 ENCOUNTER — Other Ambulatory Visit (INDEPENDENT_AMBULATORY_CARE_PROVIDER_SITE_OTHER): Payer: Medicaid Other

## 2023-01-15 DIAGNOSIS — D649 Anemia, unspecified: Secondary | ICD-10-CM | POA: Diagnosis not present

## 2023-01-15 DIAGNOSIS — E039 Hypothyroidism, unspecified: Secondary | ICD-10-CM

## 2023-01-16 LAB — CBC WITH DIFFERENTIAL/PLATELET
Basophils Absolute: 0 10*3/uL (ref 0.0–0.2)
Basos: 1 %
EOS (ABSOLUTE): 0.3 10*3/uL (ref 0.0–0.4)
Eos: 6 %
Hematocrit: 37.2 % (ref 34.0–46.6)
Hemoglobin: 12.7 g/dL (ref 11.1–15.9)
Immature Grans (Abs): 0 10*3/uL (ref 0.0–0.1)
Immature Granulocytes: 0 %
Lymphocytes Absolute: 2.5 10*3/uL (ref 0.7–3.1)
Lymphs: 52 %
MCH: 28.8 pg (ref 26.6–33.0)
MCHC: 34.1 g/dL (ref 31.5–35.7)
MCV: 84 fL (ref 79–97)
Monocytes Absolute: 0.3 10*3/uL (ref 0.1–0.9)
Monocytes: 6 %
Neutrophils Absolute: 1.7 10*3/uL (ref 1.4–7.0)
Neutrophils: 35 %
Platelets: 258 10*3/uL (ref 150–450)
RBC: 4.41 x10E6/uL (ref 3.77–5.28)
RDW: 13.9 % (ref 11.7–15.4)
WBC: 4.7 10*3/uL (ref 3.4–10.8)

## 2023-01-16 LAB — TSH+FREE T4
Free T4: 0.76 ng/dL — ABNORMAL LOW (ref 0.82–1.77)
TSH: 1.35 u[IU]/mL (ref 0.450–4.500)

## 2023-01-19 ENCOUNTER — Encounter: Payer: Self-pay | Admitting: Internal Medicine

## 2023-01-19 ENCOUNTER — Ambulatory Visit: Payer: Medicaid Other | Admitting: Internal Medicine

## 2023-01-19 VITALS — BP 101/60 | HR 66 | Ht 59.0 in | Wt 124.6 lb

## 2023-01-19 DIAGNOSIS — R519 Headache, unspecified: Secondary | ICD-10-CM | POA: Diagnosis not present

## 2023-01-19 DIAGNOSIS — I959 Hypotension, unspecified: Secondary | ICD-10-CM | POA: Diagnosis not present

## 2023-01-19 DIAGNOSIS — R42 Dizziness and giddiness: Secondary | ICD-10-CM

## 2023-01-19 NOTE — Progress Notes (Signed)
Primary Physician/Referring:  Kerin Perna, NP  Patient ID: Maria Riley, female    DOB: Oct 29, 1991, 32 y.o.   MRN: [id number]  Chief Complaint  Patient presents with  . Hypotension  . New Patient (Initial Visit)   HPI:    Maria Riley  is a 32 y.o. ***  Past Medical History:  Diagnosis Date  . Anemia   . Medical history non-contributory   . Pulmonary embolism [facility])    Past Surgical History:  Procedure Laterality Date  . ABDOMINAL HYSTERECTOMY    . CESAREAN SECTION N/A 06/13/2018   Procedure: CESAREAN SECTION;  Surgeon: Aletha Halim, MD;  Location: [place];  Service: Obstetrics;  Laterality: N/A;  . IR CATHETER TUBE CHANGE  02/05/2022  . IR IMAGE GUIDED DRAINAGE BY PERCUTANEOUS CATHETER  02/18/2022  . IR RADIOLOGIST EVAL & MGMT  02/04/2022  . IR RADIOLOGIST EVAL & MGMT  02/12/2022  . IR RADIOLOGIST EVAL & MGMT  03/11/2022  . LYSIS OF ADHESION N/A 08/19/2022   Procedure: LYSIS OF ADHESIONS;  Surgeon: Michael Boston, MD;  Location: [facility];  Service: General;  Laterality: N/A;  . WISDOM TOOTH EXTRACTION Bilateral   . XI ROBOTIC ASSISTED COLOSTOMY TAKEDOWN N/A 08/19/2022   Procedure: XI ROBOTIC ASSISTED END ILEOSTOMY TAKEDOWN;  Surgeon: Michael Boston, MD;  Location: [facility];  Service: General;  Laterality: N/A;   Family History  Problem Relation Age of Onset  . Healthy Father   . Alcohol abuse Neg Hx   . Arthritis Neg Hx   . Asthma Neg Hx   . Birth defects Neg Hx   . Cancer Neg Hx   . COPD Neg Hx   . Depression Neg Hx   . Diabetes Neg Hx   . Drug abuse Neg Hx   . Early death Neg Hx   . Hearing loss Neg Hx   . Heart disease Neg Hx   . Hyperlipidemia Neg Hx   . Hypertension Neg Hx   . Kidney disease Neg Hx   . Learning disabilities Neg Hx   . Mental illness Neg Hx   . Mental retardation Neg Hx   . Miscarriages / Stillbirths Neg Hx   . Stroke Neg Hx   . Vision loss Neg Hx   . Varicose Veins Neg Hx     Social History   Tobacco Use  .  Smoking status: Never  . Smokeless tobacco: Never  Substance Use Topics  . Alcohol use: No   Marital Status: Single  ROS  ***ROS Objective  Blood pressure 101/60, pulse 66, height 4' 11"$  (1.499 m), weight 124 lb 9.6 oz (56.5 kg), last menstrual period 06/08/2021, SpO2 100 %. Body mass index is 25.17 kg/m.     01/19/2023    1:37 PM 01/14/2023    4:28 PM 09/21/2022   11:52 AM  Vitals with BMI  Height 4' 11"$   4' 11"$   Weight 124 lbs 10 oz 123 lbs 120 lbs  BMI [id number]  [id number]  Systolic 99991111 93 91  Diastolic 60 59 66  Pulse 66 65 63     ***Physical Exam  Medications and allergies  No Active Allergies   Medication list after today's encounter   Current Outpatient Medications:  .  ibuprofen (ADVIL) 600 MG tablet, Take 1 tablet (600 mg total) by mouth every 8 (eight) hours as needed., Disp: 30 tablet, Rfl: 0 .  levothyroxine (SYNTHROID) 25 MCG tablet, TAKE 1 TABLET BY MOUTH EVERY DAY, Disp: 90 tablet, Rfl:  0  Laboratory examination:   Lab Results  Component Value Date   NA 136 08/22/2022   K 3.6 08/22/2022   CO2 28 08/22/2022   GLUCOSE 74 08/22/2022   BUN 7 08/22/2022   CREATININE 0.40 (L) 08/22/2022   CALCIUM 7.9 (L) 08/22/2022   GFRNONAA >60 08/22/2022       Latest Ref Rng & Units 08/22/2022    5:03 AM 08/21/2022    8:59 AM 08/20/2022    5:00 AM  CMP  Glucose 70 - 99 mg/dL 74  92  144   BUN 6 - 20 mg/dL 7  14  13   $ Creatinine 0.44 - 1.00 mg/dL 0.40  0.55  0.73   Sodium 135 - 145 mmol/L 136  138  135   Potassium 3.5 - 5.1 mmol/L 3.6  3.2  4.0   Chloride 98 - 111 mmol/L 104  107  107   CO2 22 - 32 mmol/L 28  27  22   $ Calcium 8.9 - 10.3 mg/dL 7.9  8.4  8.7       Latest Ref Rng & Units 01/15/2023    9:36 AM 08/22/2022    5:03 AM 08/21/2022    8:59 AM  CBC  WBC 3.4 - 10.8 x10E3/uL 4.7  9.7  9.6   Hemoglobin 11.1 - 15.9 g/dL 12.7  9.0  9.5   Hematocrit 34.0 - 46.6 % 37.2  26.4  28.1   Platelets 150 - 450 x10E3/uL 258  207  208     Lipid Panel No results for input(s):  "CHOL", "TRIG", "LDLCALC", "VLDL", "HDL", "CHOLHDL", "LDLDIRECT" in the last 8760 hours.  HEMOGLOBIN A1C Lab Results  Component Value Date   HGBA1C 4.8 08/14/2022   MPG 91.06 08/14/2022   TSH Recent Labs    06/04/22 1513 01/15/23 0936  TSH 1.410 1.350    External labs:   ***  Radiology:    Cardiac Studies:   No results found for this or any previous visit from the past 1095 days.     No results found for this or any previous visit from the past 1095 days.   ***  EKG:   ***  ***  Assessment     ICD-10-CM   1. Hypotension, unspecified hypotension type  I95.9 EKG 12-Lead       Orders Placed This Encounter  Procedures  . EKG 12-Lead    No orders of the defined types were placed in this encounter.   Medications Discontinued During This Encounter  Medication Reason  . traMADol (ULTRAM) 50 MG tablet Completed Course     Recommendations:   Maria Riley is a 32 y.o.  ***    Collene Mares [place], DO, [facility]  01/19/2023, 2:03 PM Office: [phone] Pager: [phone]

## 2023-01-21 ENCOUNTER — Other Ambulatory Visit (INDEPENDENT_AMBULATORY_CARE_PROVIDER_SITE_OTHER): Payer: Self-pay | Admitting: Primary Care

## 2023-01-21 MED ORDER — LEVOTHYROXINE SODIUM 50 MCG PO TABS: 50.0000 ug | ORAL_TABLET | Freq: Every day | ORAL | 0 refills | Status: DC

## 2023-01-27 ENCOUNTER — Encounter (INDEPENDENT_AMBULATORY_CARE_PROVIDER_SITE_OTHER): Payer: Self-pay

## 2023-02-04 ENCOUNTER — Telehealth (INDEPENDENT_AMBULATORY_CARE_PROVIDER_SITE_OTHER): Payer: Self-pay | Admitting: Primary Care

## 2023-02-04 NOTE — Telephone Encounter (Signed)
Pt. Given lab results and instructions.Verbalizes understanding. 

## 2023-02-12 ENCOUNTER — Ambulatory Visit (INDEPENDENT_AMBULATORY_CARE_PROVIDER_SITE_OTHER): Payer: Medicaid Other | Admitting: Primary Care

## 2023-02-18 ENCOUNTER — Telehealth (INDEPENDENT_AMBULATORY_CARE_PROVIDER_SITE_OTHER): Payer: Self-pay | Admitting: Primary Care

## 2023-02-18 ENCOUNTER — Encounter (INDEPENDENT_AMBULATORY_CARE_PROVIDER_SITE_OTHER): Payer: Medicaid Other | Admitting: Primary Care

## 2023-02-18 ENCOUNTER — Telehealth (INDEPENDENT_AMBULATORY_CARE_PROVIDER_SITE_OTHER): Payer: Self-pay | Admitting: Licensed Clinical Social Worker

## 2023-02-18 NOTE — Telephone Encounter (Addendum)
LCSWA met with patient today during a warm hand off, to introduce herself and to assess patients' mental health needs. Patient was referred by PCP for depression. Patient husband is in the process of be deployed and she wanted to discuss her emotions and past/current experiences with depression and harmful thoughts. Patient would like a letter to share her thoughts and concerns of her mental health in the event her husband has to leave.  Letter created below: LCSWA also has it in word format.  [place] [address] [place], [place] [address] [phone]  Dear Magazine features editor,  I am writing to you as a licensed therapist on behalf of my client, Maria Riley, who is currently facing significant mental health challenges. As her therapist, I will be closely monitoring her condition and providing her with the necessary support and treatment.  Maria Riley has symptoms of depression and is experiencing frequent episodes of harmful thoughts. In particular, she is struggling with the prospect of her husband's deployment, which exacerbates her symptoms and increases her distress.  Given the severity of her mental health issues, I am deeply concerned about the potential impact of her husband's deployment on her condition. The separation from her primary source of support and stability could lead to a worsening of her symptoms and pose a risk to her safety.  As a therapist, I strongly believe that it is in Maria Riley's best interest to have her husband's deployment reconsidered until she is able to stabilize her mental health and develop coping strategies to manage her symptoms effectively. I am confident that with the appropriate support and treatment, she can overcome this challenging period and regain a sense of stability in her life.  I kindly request that you take Maria Riley's mental health condition into consideration when reviewing her husband's deployment  status. Your understanding and support in this matter would be greatly appreciated and would contribute to Maria Riley's ongoing recovery journey. Thank you for your attention to this important matter.  Sincerely,   [facility]  Maria Riley, MSW, [place]  Email: [email or web address] Phone: [phone]

## 2023-02-18 NOTE — Telephone Encounter (Signed)
Spoke with  [employer] they on longer have legal counsel on site but they do have resources available they could aide with the patient's current situation. Spoke with patient patient via interpreter [phone]. Patient given information to get in touch with  [employer].

## 2023-02-18 NOTE — Telephone Encounter (Signed)
Spoke with  [employer] they on longer have legal counsel on site but they do have resources available they could aide with the patient's current situation. Spoke with patient patient via interpreter [phone]. Patient given information to get in touch with  [employer].

## 2023-02-18 NOTE — Telephone Encounter (Signed)
Pt is calling to see if the social worker has written her letter for immigration. Please advise when the letter is available. CB- [phone]

## 2023-02-19 ENCOUNTER — Ambulatory Visit (INDEPENDENT_AMBULATORY_CARE_PROVIDER_SITE_OTHER): Payer: Medicaid Other | Admitting: Primary Care

## 2023-02-19 ENCOUNTER — Telehealth: Payer: Self-pay | Admitting: Emergency Medicine

## 2023-02-19 NOTE — Telephone Encounter (Signed)
Copied from [place] [id number]. Topic: General - Other >> Feb 19, 2023  8:06 AM Oley Balm A wrote: Reason for CRM: Pt states that she had an office visit with PCP yesterday 02/18/23 and was told she needed an immunization letter. Pt states that Doroteo Bradford gave her a number to a Education officer, museum for the letter but she does not understand. Please call pt back.

## 2023-02-21 NOTE — Progress Notes (Signed)
Patient was seen by clinical Education officer, museum.  She presented today depressed and anxious due to her husband going to be deported back to [place] on Monday, 02/22/2023

## 2023-02-24 NOTE — Telephone Encounter (Signed)
noted 

## 2023-02-24 NOTE — Telephone Encounter (Signed)
received

## 2023-02-24 NOTE — Telephone Encounter (Signed)
Received, met with pt last week and provided [facility] with a letter to review and give to pt.

## 2023-03-25 ENCOUNTER — Other Ambulatory Visit (INDEPENDENT_AMBULATORY_CARE_PROVIDER_SITE_OTHER): Payer: Self-pay | Admitting: Primary Care

## 2023-03-26 NOTE — Telephone Encounter (Signed)
Requested Prescriptions  Pending Prescriptions Disp Refills   levothyroxine (SYNTHROID) 50 MCG tablet [Pharmacy Med Name: LEVOTHYROXINE 50 MCG TABLET] 90 tablet 0    Sig: TAKE 1 TABLET BY MOUTH EVERY DAY     Endocrinology:  Hypothyroid Agents Passed - 03/25/2023 10:30 AM      Passed - TSH in normal range and within 360 days    TSH  Date Value Ref Range Status  01/15/2023 1.350 0.450 - 4.500 uIU/mL Final         Passed - Valid encounter within last 12 months    Recent Outpatient Visits           2 months ago Hypothyroidism, unspecified type   [place] [facility] [name], NP   6 months ago Other viral warts   [place] [facility] [name], NP   7 months ago Acute tonsillitis due to other specified organisms   [place] [facility] [name], NP   9 months ago Hair loss disorder   [place] [facility] [name], NP   11 months ago DIC (disseminated intravascular coagulation) [facility])    [facility] [name], NP

## 2023-04-29 ENCOUNTER — Other Ambulatory Visit: Payer: Self-pay

## 2023-04-29 ENCOUNTER — Emergency Department (HOSPITAL_COMMUNITY)
Admission: EM | Admit: 2023-04-29 | Discharge: 2023-04-29 | Disposition: A | Discharge status: Home / Self Care | Payer: Medicaid Other | Attending: Emergency Medicine | Admitting: Emergency Medicine

## 2023-04-29 DIAGNOSIS — N3090 Cystitis, unspecified without hematuria: Secondary | ICD-10-CM | POA: Diagnosis not present

## 2023-04-29 DIAGNOSIS — R1030 Lower abdominal pain, unspecified: Secondary | ICD-10-CM | POA: Insufficient documentation

## 2023-04-29 DIAGNOSIS — R350 Frequency of micturition: Secondary | ICD-10-CM | POA: Insufficient documentation

## 2023-04-29 DIAGNOSIS — R319 Hematuria, unspecified: Secondary | ICD-10-CM | POA: Insufficient documentation

## 2023-04-29 LAB — URINALYSIS, ROUTINE W REFLEX MICROSCOPIC
Bilirubin Urine: NEGATIVE
Glucose, UA: NEGATIVE mg/dL
Ketones, ur: NEGATIVE mg/dL
Nitrite: NEGATIVE
Protein, ur: 100 mg/dL — AB
RBC / HPF: >50 RBC/hpf (ref 0–5)
Specific Gravity, Urine: 1.016 (ref 1.005–1.030)
WBC, UA: >50 WBC/hpf (ref 0–5)
pH: 6 (ref 5.0–8.0)

## 2023-04-29 MED ORDER — CEPHALEXIN 500 MG PO CAPS: 500.0000 mg | ORAL_CAPSULE | Freq: Four times a day (QID) | ORAL | 0 refills | Status: DC

## 2023-04-29 MED ORDER — PHENAZOPYRIDINE HCL 200 MG PO TABS: 200.0000 mg | ORAL_TABLET | Freq: Three times a day (TID) | ORAL | 0 refills | Status: DC

## 2023-04-29 NOTE — ED Notes (Signed)
ED Provider at bedside. 

## 2023-04-29 NOTE — ED Triage Notes (Signed)
Pt reports painful urination and blood in urine since yesterday afternoon.

## 2023-04-29 NOTE — ED Provider Notes (Signed)
[place] EMERGENCY DEPARTMENT AT [facility] Provider Note   CSN: [id number] Arrival date & time: 04/29/23  4098     History  Chief Complaint  Patient presents with   Dysuria    Maria Riley is a 32 y.o. female.  32 year old female presents with urinary frequency x 2 days.  History of same associate with UTI.  She is also hematuria as well to.  Had a prior prescription of Macrobid and she took a dose yesterday and 1 today.  No fever or flank pain.  She is status post hysterectomy denies any vaginal bleeding.  Denies any vaginal discharge.      Home Medications Prior to Admission medications   Medication Sig Start Date End Date Taking? Authorizing Provider  ibuprofen (ADVIL) 600 MG tablet Take 1 tablet (600 mg total) by mouth every 8 (eight) hours as needed. 01/14/23   [name], NP  levothyroxine (SYNTHROID) 50 MCG tablet TAKE 1 TABLET BY MOUTH EVERY DAY 03/26/23   [name], NP      Allergies    Patient has no active allergies.    Review of Systems   Review of Systems  All other systems reviewed and are negative.   Physical Exam Updated Vital Signs BP 103/65 (BP Location: Left Arm)   Pulse 60   Temp 98.3 F (36.8 C) (Oral)   Resp 19   Ht 1.499 m (4\' 11" )   Wt 56 kg   LMP 06/08/2021 (Approximate)   SpO2 96%   BMI 24.94 kg/m  Physical Exam Vitals and nursing note reviewed.  Constitutional:      General: She is not in acute distress.    Appearance: Normal appearance. She is well-developed. She is not toxic-appearing.  HENT:     Head: Normocephalic and atraumatic.  Eyes:     General: Lids are normal.     Conjunctiva/sclera: Conjunctivae normal.     Pupils: Pupils are equal, round, and reactive to light.  Neck:     Thyroid: No thyroid mass.     Trachea: No tracheal deviation.  Cardiovascular:     Rate and Rhythm: Normal rate and regular rhythm.     Heart sounds: Normal heart sounds. No murmur heard.    No gallop.   Pulmonary:     Effort: Pulmonary effort is normal. No respiratory distress.     Breath sounds: Normal breath sounds. No stridor. No decreased breath sounds, wheezing, rhonchi or rales.  Abdominal:     General: There is no distension.     Palpations: Abdomen is soft.     Tenderness: There is abdominal tenderness in the suprapubic area. There is no rebound.    Musculoskeletal:        General: No tenderness. Normal range of motion.     Cervical back: Normal range of motion and neck supple.  Skin:    General: Skin is warm and dry.     Findings: No abrasion or rash.  Neurological:     Mental Status: She is alert and oriented to person, place, and time. Mental status is at baseline.     GCS: GCS eye subscore is 4. GCS verbal subscore is 5. GCS motor subscore is 6.     Cranial Nerves: No cranial nerve deficit.     Sensory: No sensory deficit.     Motor: Motor function is intact.  Psychiatric:        Attention and Perception: Attention normal.  Speech: Speech normal.        Behavior: Behavior normal.    ED Results / Procedures / Treatments   Labs (all labs ordered are listed, but only abnormal results are displayed) Labs Reviewed  URINALYSIS, ROUTINE W REFLEX MICROSCOPIC    EKG None  Radiology No results found.  Procedures Procedures    Medications Ordered in ED Medications - No data to display  ED Course/ Medical Decision Making/ A&P                             Medical Decision Making Amount and/or Complexity of Data Reviewed Labs: ordered.   Patient is urinalysis consistent with hemorrhagic cystitis.  Will place patient on Keflex and add Pyridium.        Final Clinical Impression(s) / ED Diagnoses Final diagnoses:  None    Rx / DC Orders ED Discharge Orders     None         Lorre Nick, MD 04/29/23 1005

## 2023-05-11 ENCOUNTER — Encounter (INDEPENDENT_AMBULATORY_CARE_PROVIDER_SITE_OTHER): Payer: Self-pay | Admitting: Primary Care

## 2023-05-11 ENCOUNTER — Ambulatory Visit (INDEPENDENT_AMBULATORY_CARE_PROVIDER_SITE_OTHER): Payer: Medicaid Other | Admitting: Primary Care

## 2023-05-11 VITALS — BP 97/62 | HR 66 | Resp 16 | Wt 127.4 lb

## 2023-05-11 DIAGNOSIS — N3001 Acute cystitis with hematuria: Secondary | ICD-10-CM | POA: Diagnosis not present

## 2023-05-11 LAB — POCT URINALYSIS DIP (CLINITEK)
Bilirubin, UA: NEGATIVE
Blood, UA: NEGATIVE
Glucose, UA: NEGATIVE mg/dL
Ketones, POC UA: NEGATIVE mg/dL
Nitrite, UA: NEGATIVE
POC PROTEIN,UA: NEGATIVE
Spec Grav, UA: 1.015 (ref 1.010–1.025)
Urobilinogen, UA: 0.2 E.U./dL
pH, UA: 6.5 (ref 5.0–8.0)

## 2023-05-11 NOTE — Progress Notes (Signed)
    [facility]  Subjective:   [name] is a 32 y.o. Hispanic female (interpreter Melanee Spry [phone] )who complains of burning with urination, constipation, dysuria, hematuria, incomplete bladder emptying, pain in the periumbilical area, and suprapubic pressure. She has had symptoms for 3 MONTHS . Patient also complains of back pain. Patient denies cough, fever, headache, rhinitis, sorethroat, stomach ache, and vaginal discharge. Patient does have a history of recurrent UTI. Patient does have a history of pyelonephritis.   The following portions of the patient's history were reviewed and updated as appropriate: allergies, current medications, past family history, past medical history, past social history, past surgical history, and problem list.  Review of Systems Pertinent items noted in HPI and remainder of comprehensive ROS otherwise negative.    Objective:  Blood Pressure 97/62   Pulse 66   Respiration 16   Weight 127 lb 6.4 oz (57.8 kg)   Last Menstrual Period 06/08/2021 (Approximate)   Oxygen Saturation 95%   Body Mass Index 25.73 kg/m   General: No apparent distress. Eyes: Extraocular eye movements intact, pupils equal and round. Neck: Supple, trachea midline. Cardiovascular: Regular rhythm and rate, no murmur, normal radial pulses. Respiratory: Normal respiratory effort, clear to auscultation. Gastrointestinal: Normal pitch active bowel sounds, tender abdomen without distention  Skin: Appropriate warmth, no visible rash. Mental status: Alert, conversant, speech clear, thought logical, appropriate mood and affect, no hallucinations or delusions evident. Hematologic/lymphatic: No cervical adenopathy, no visible ecchymoses.  Laboratory:  Urine dipstick: trace for leukocyte esterase and negative for nitrites.   Micro exam: not done.    Assessment:  Chinelle was seen today for abdominal pain and urinary tract infection.  Diagnoses and all orders for this  visit:  Acute cystitis with hematuria      Not on exam pt c/o pus, oozing yellow fluid and painful to touch POCT URINALYSIS DIP (CLINITEK)   Medications: nitrofurantoin.  REFER TO UROLOGY  -     Ambulatory referral to Urology -     urine culture   This note has been created with Dragon speech recognition software and Paediatric nurse. Any transcriptional errors are unintentional.   [name], NP 05/11/2023, 11:54 AM

## 2023-05-14 LAB — URINE CULTURE

## 2023-05-15 ENCOUNTER — Other Ambulatory Visit (INDEPENDENT_AMBULATORY_CARE_PROVIDER_SITE_OTHER): Payer: Self-pay | Admitting: Primary Care

## 2023-05-15 MED ORDER — FLUCONAZOLE 150 MG PO TABS: 150.0000 mg | ORAL_TABLET | Freq: Every day | ORAL | 1 refills | Status: DC

## 2023-05-15 MED ORDER — NITROFURANTOIN MONOHYD MACRO 100 MG PO CAPS: 100.0000 mg | ORAL_CAPSULE | Freq: Two times a day (BID) | ORAL | 0 refills | Status: DC

## 2023-05-19 ENCOUNTER — Telehealth (INDEPENDENT_AMBULATORY_CARE_PROVIDER_SITE_OTHER): Payer: Self-pay

## 2023-05-19 NOTE — Telephone Encounter (Signed)
[employer] Vernona Rieger  Id# [id number]  contacted pt to go over lab results pt is aware and doesn't have any questions or concerns

## 2023-05-30 ENCOUNTER — Other Ambulatory Visit (INDEPENDENT_AMBULATORY_CARE_PROVIDER_SITE_OTHER): Payer: Self-pay | Admitting: Primary Care

## 2023-05-31 ENCOUNTER — Other Ambulatory Visit (INDEPENDENT_AMBULATORY_CARE_PROVIDER_SITE_OTHER): Payer: Self-pay

## 2023-05-31 MED ORDER — LEVOTHYROXINE SODIUM 50 MCG PO TABS: 50.0000 ug | ORAL_TABLET | Freq: Every day | ORAL | 0 refills | Status: DC

## 2023-06-23 ENCOUNTER — Other Ambulatory Visit (INDEPENDENT_AMBULATORY_CARE_PROVIDER_SITE_OTHER): Payer: Self-pay | Admitting: Primary Care

## 2023-07-27 ENCOUNTER — Other Ambulatory Visit (INDEPENDENT_AMBULATORY_CARE_PROVIDER_SITE_OTHER): Payer: Self-pay | Admitting: Primary Care

## 2023-07-29 ENCOUNTER — Other Ambulatory Visit (INDEPENDENT_AMBULATORY_CARE_PROVIDER_SITE_OTHER): Payer: Medicaid Other

## 2023-07-29 DIAGNOSIS — E039 Hypothyroidism, unspecified: Secondary | ICD-10-CM

## 2023-07-30 LAB — TSH+FREE T4
Free T4: 0.8 ng/dL — ABNORMAL LOW (ref 0.82–1.77)
TSH: 1.38 u[IU]/mL (ref 0.450–4.500)

## 2023-08-03 ENCOUNTER — Other Ambulatory Visit (INDEPENDENT_AMBULATORY_CARE_PROVIDER_SITE_OTHER): Payer: Self-pay | Admitting: Primary Care

## 2023-08-03 MED ORDER — LEVOTHYROXINE SODIUM 50 MCG PO TABS: 50.0000 ug | ORAL_TABLET | Freq: Every day | ORAL | 1 refills | Status: DC

## 2023-09-03 ENCOUNTER — Ambulatory Visit (INDEPENDENT_AMBULATORY_CARE_PROVIDER_SITE_OTHER): Payer: Medicaid Other | Admitting: Primary Care

## 2023-09-15 ENCOUNTER — Ambulatory Visit (INDEPENDENT_AMBULATORY_CARE_PROVIDER_SITE_OTHER): Payer: Medicaid Other

## 2023-09-15 ENCOUNTER — Encounter (INDEPENDENT_AMBULATORY_CARE_PROVIDER_SITE_OTHER): Payer: Self-pay

## 2023-09-15 ENCOUNTER — Other Ambulatory Visit (HOSPITAL_COMMUNITY)
Admission: RE | Admit: 2023-09-15 | Discharge: 2023-09-15 | Disposition: A | Discharge status: Home / Self Care | Payer: Medicaid Other | Source: Ambulatory Visit | Attending: Primary Care | Admitting: Primary Care

## 2023-09-15 DIAGNOSIS — Z113 Encounter for screening for infections with a predominantly sexual mode of transmission: Secondary | ICD-10-CM

## 2023-09-15 DIAGNOSIS — R300 Dysuria: Secondary | ICD-10-CM | POA: Diagnosis not present

## 2023-09-15 LAB — POCT URINALYSIS DIP (CLINITEK)
Bilirubin, UA: NEGATIVE
Blood, UA: NEGATIVE
Glucose, UA: NEGATIVE mg/dL
Ketones, POC UA: NEGATIVE mg/dL
Nitrite, UA: NEGATIVE
POC PROTEIN,UA: NEGATIVE
Spec Grav, UA: 1.02 (ref 1.010–1.025)
Urobilinogen, UA: 0.2 U/dL
pH, UA: 7 (ref 5.0–8.0)

## 2023-09-15 NOTE — Progress Notes (Signed)
Patient was changed to a nurse visit per provider.  Pt complains of burning with urination

## 2023-09-16 MED ORDER — PHENAZOPYRIDINE HCL 200 MG PO TABS: 200.0000 mg | ORAL_TABLET | Freq: Three times a day (TID) | ORAL | 0 refills | Status: DC

## 2023-09-17 ENCOUNTER — Telehealth (INDEPENDENT_AMBULATORY_CARE_PROVIDER_SITE_OTHER): Payer: Self-pay

## 2023-09-17 ENCOUNTER — Other Ambulatory Visit (INDEPENDENT_AMBULATORY_CARE_PROVIDER_SITE_OTHER): Payer: Self-pay | Admitting: Primary Care

## 2023-09-17 LAB — CERVICOVAGINAL ANCILLARY ONLY
Bacterial Vaginitis (gardnerella): POSITIVE — AB
Candida Glabrata: NEGATIVE
Candida Vaginitis: NEGATIVE
Chlamydia: NEGATIVE
Comment: NEGATIVE
Comment: NEGATIVE
Comment: NEGATIVE
Comment: NEGATIVE
Comment: NEGATIVE
Comment: NORMAL
Neisseria Gonorrhea: NEGATIVE
Trichomonas: NEGATIVE

## 2023-09-17 MED ORDER — METRONIDAZOLE 500 MG PO TABS: 500.0000 mg | ORAL_TABLET | Freq: Two times a day (BID) | ORAL | 0 refills | Status: DC

## 2023-09-17 NOTE — Telephone Encounter (Signed)
Contacted pt to go over lab results pt is aware and doesn't have any questions or concerns 

## 2023-10-29 DIAGNOSIS — R87810 Cervical high risk human papillomavirus (HPV) DNA test positive: Secondary | ICD-10-CM | POA: Diagnosis not present

## 2023-10-29 DIAGNOSIS — R102 Pelvic and perineal pain: Secondary | ICD-10-CM | POA: Diagnosis not present

## 2023-10-29 DIAGNOSIS — Z0001 Encounter for general adult medical examination with abnormal findings: Secondary | ICD-10-CM | POA: Diagnosis not present

## 2023-10-29 DIAGNOSIS — Z124 Encounter for screening for malignant neoplasm of cervix: Secondary | ICD-10-CM | POA: Diagnosis not present

## 2023-11-02 DIAGNOSIS — R3121 Asymptomatic microscopic hematuria: Secondary | ICD-10-CM | POA: Diagnosis not present

## 2023-11-02 DIAGNOSIS — Z87440 Personal history of urinary (tract) infections: Secondary | ICD-10-CM | POA: Diagnosis not present

## 2023-11-09 ENCOUNTER — Other Ambulatory Visit (INDEPENDENT_AMBULATORY_CARE_PROVIDER_SITE_OTHER): Payer: Self-pay | Admitting: Primary Care

## 2023-11-11 ENCOUNTER — Telehealth (INDEPENDENT_AMBULATORY_CARE_PROVIDER_SITE_OTHER): Payer: Self-pay | Admitting: Primary Care

## 2023-11-11 ENCOUNTER — Telehealth: Payer: Self-pay | Admitting: Primary Care

## 2023-11-11 ENCOUNTER — Other Ambulatory Visit (INDEPENDENT_AMBULATORY_CARE_PROVIDER_SITE_OTHER): Payer: Medicaid Other

## 2023-11-11 DIAGNOSIS — E039 Hypothyroidism, unspecified: Secondary | ICD-10-CM | POA: Diagnosis not present

## 2023-11-11 NOTE — Telephone Encounter (Signed)
Copied from CRM [id number]. Topic: General - Inquiry >> Nov 11, 2023  3:00 PM Teressa P wrote: Reason for CRM: pt called saying they are leaving out of the country tomorrow and needs her medication for her thyroid.  She also inquired about her lab.  CB@  [phone]

## 2023-11-11 NOTE — Telephone Encounter (Signed)
Called pt and instructed that medication will be at pharmacy in the morning depending on results. Left VM with pt.

## 2023-11-11 NOTE — Telephone Encounter (Signed)
Please reach out to pt and make her aware that we are waiting for results. Please see what time she is suppose to leave because medication will be sent to the pharmacy in the morning depending on results. May have to adjust medication depending on results.

## 2023-11-12 ENCOUNTER — Other Ambulatory Visit (INDEPENDENT_AMBULATORY_CARE_PROVIDER_SITE_OTHER): Payer: Self-pay

## 2023-11-12 LAB — TSH+FREE T4
Free T4: 0.85 ng/dL (ref 0.82–1.77)
TSH: 1.37 u[IU]/mL (ref 0.450–4.500)

## 2023-11-12 NOTE — Telephone Encounter (Signed)
Contacted pt to make aware that per Maria Riley Thyroid is normal and continue same dose of Levothyroxine . Wanted to confirm pharmacy with pt but pt states she is already at the airport is not able to get medication. Pt states she will have to get medication from the provider when she gets  to her country. Pt states she will follow up once she returns. Pt states don't have to worry about sending in rx today for thyroid.

## 2024-01-18 DIAGNOSIS — R3121 Asymptomatic microscopic hematuria: Secondary | ICD-10-CM | POA: Diagnosis not present

## 2024-01-18 DIAGNOSIS — Z87440 Personal history of urinary (tract) infections: Secondary | ICD-10-CM | POA: Diagnosis not present

## 2024-01-19 ENCOUNTER — Other Ambulatory Visit (INDEPENDENT_AMBULATORY_CARE_PROVIDER_SITE_OTHER): Payer: Self-pay | Admitting: Primary Care

## 2024-01-20 NOTE — Telephone Encounter (Signed)
Requested by interface surescripts. Last labs 11/11/23.  Requested Prescriptions  Pending Prescriptions Disp Refills   levothyroxine (SYNTHROID) 50 MCG tablet [Pharmacy Med Name: LEVOTHYROXINE 50 MCG TABLET] 30 tablet 1    Sig: TAKE 1 TABLET BY MOUTH EVERY DAY     Endocrinology:  Hypothyroid Agents Passed - 01/20/2024  8:09 AM      Passed - TSH in normal range and within 360 days    TSH  Date Value Ref Range Status  11/11/2023 1.370 0.450 - 4.500 uIU/mL Final         Passed - Valid encounter within last 12 months    Recent Outpatient Visits           8 months ago Acute cystitis with hematuria   [place] [facility] [name], NP   1 year ago Hypothyroidism, unspecified type   [place] [facility] [name], NP   1 year ago Other viral warts   [place] [facility] [name], NP   1 year ago Acute tonsillitis due to other specified organisms   [place] [facility] [name], NP   1 year ago Hair loss disorder    [facility] [name], NP

## 2024-01-30 ENCOUNTER — Emergency Department (HOSPITAL_COMMUNITY)
Admission: EM | Admit: 2024-01-30 | Discharge: 2024-01-30 | Disposition: A | Discharge status: Home / Self Care | Payer: Medicaid Other | Attending: Emergency Medicine | Admitting: Emergency Medicine

## 2024-01-30 ENCOUNTER — Encounter (HOSPITAL_COMMUNITY): Payer: Self-pay | Admitting: Emergency Medicine

## 2024-01-30 ENCOUNTER — Other Ambulatory Visit: Payer: Self-pay

## 2024-01-30 DIAGNOSIS — R519 Headache, unspecified: Secondary | ICD-10-CM | POA: Diagnosis present

## 2024-01-30 DIAGNOSIS — G43909 Migraine, unspecified, not intractable, without status migrainosus: Secondary | ICD-10-CM | POA: Diagnosis not present

## 2024-01-30 LAB — RESP PANEL BY RT-PCR (RSV, FLU A&B, COVID)  RVPGX2
Influenza A by PCR: NEGATIVE
Influenza B by PCR: NEGATIVE
Resp Syncytial Virus by PCR: NEGATIVE
SARS Coronavirus 2 by RT PCR: NEGATIVE

## 2024-01-30 MED ORDER — ACETAMINOPHEN 500 MG PO TABS
1000.0000 mg | ORAL_TABLET | Freq: Once | ORAL | Status: AC
  Administered 2024-01-30: 1000 mg via ORAL
  Filled 2024-01-30: qty 2

## 2024-01-30 MED ORDER — DIPHENHYDRAMINE HCL 50 MG/ML IJ SOLN
12.5000 mg | Freq: Once | INTRAMUSCULAR | Status: AC
  Administered 2024-01-30: 12.5 mg via INTRAVENOUS
  Filled 2024-01-30: qty 1

## 2024-01-30 MED ORDER — METOCLOPRAMIDE HCL 5 MG/ML IJ SOLN
10.0000 mg | Freq: Once | INTRAMUSCULAR | Status: AC
  Administered 2024-01-30: 10 mg via INTRAVENOUS
  Filled 2024-01-30: qty 2

## 2024-01-30 MED ORDER — SODIUM CHLORIDE 0.9 % IV BOLUS
1000.0000 mL | Freq: Once | INTRAVENOUS | Status: AC
  Administered 2024-01-30: 1000 mL via INTRAVENOUS

## 2024-01-30 NOTE — ED Provider Notes (Signed)
 [place] EMERGENCY DE[facility]MENT AT [facility] Provider Note   CSN: [id number] Arrival date & time: 01/30/24  1301     History  Chief Complaint  Patient presents with   Headache   Nausea    Maria Riley is a 33 y.o. female.   Headache    33 year old female presenting to the emergency department with a headache.  The history is provided with the aid of a Spanish language iPad interpreter.  The patient states that for the last 2 days she has had a headache located primarily on 1 side, throbbing in nature occasionally rating across her forehead with associated nausea and light sensitivity and sound sensitivity.  The patient denies a history of migraine headaches.  She been taking Tylenol at home without relief.  Denies any fevers, chills or neck stiffness.  No falls or trauma.  She denies any neurologic deficits, no vision loss or double vision, no focal weakness or numbness.  Home Medications Prior to Admission medications   Medication Sig Start Date End Date Taking? Authorizing Provider  fluconazole (DIFLUCAN) 150 MG tablet Take 1 tablet (150 mg total) by mouth daily. 05/15/23   [name], NP  ibuprofen (ADVIL) 600 MG tablet Take 1 tablet (600 mg total) by mouth every 8 (eight) hours as needed. 01/14/23   [name], NP  levothyroxine (SYNTHROID) 50 MCG tablet TAKE 1 TABLET BY MOUTH EVERY DAY 01/20/24   [name], NP  metroNIDAZOLE (FLAGYL) 500 MG tablet Take 1 tablet (500 mg total) by mouth 2 (two) times daily. 09/17/23   [name], NP  phenazopyridine (PYRIDIUM) 200 MG tablet Take 1 tablet (200 mg total) by mouth 3 (three) times daily. 09/16/23   [name], NP      Allergies    Patient has no known allergies.    Review of Systems   Review of Systems  Neurological:  Positive for headaches.  All other systems reviewed and are negative.   Physical Exam Updated Vital Signs BP 108/63 (BP Location: Right Arm)   Pulse  (!) 58   Temp 98.4 F (36.9 C) (Oral)   Resp 18   LMP 06/08/2021 (Approximate)   SpO2 99%  Physical Exam Vitals and nursing note reviewed.  Constitutional:      General: She is not in acute distress. HENT:     Head: Normocephalic and atraumatic.  Eyes:     Conjunctiva/sclera: Conjunctivae normal.     Pupils: Pupils are equal, round, and reactive to light.  Neck:     Comments: No meningismus Cardiovascular:     Rate and Rhythm: Normal rate and regular rhythm.  Pulmonary:     Effort: Pulmonary effort is normal. No respiratory distress.  Abdominal:     General: There is no distension.     Tenderness: There is no guarding.  Musculoskeletal:        General: No deformity or signs of injury.     Cervical back: Full passive range of motion without pain and neck supple.  Skin:    Findings: No lesion or rash.  Neurological:     General: No focal deficit present.     Mental Status: She is alert. Mental status is at baseline.     Comments: MENTAL STATUS EXAM:    Orientation: Alert and oriented to person, place and time.  Memory: Cooperative, follows commands well.  Language: Speech is clear and language is normal.   CRANIAL NERVES:    CN  2 (Optic): Visual fields intact to confrontation.  CN 3,4,6 (EOM): Pupils equal and reactive to light. Full extraocular eye movement without nystagmus.  CN 5 (Trigeminal): Facial sensation is normal, no weakness of masticatory muscles.  CN 7 (Facial): No facial weakness or asymmetry.  CN 8 (Auditory): Auditory acuity grossly normal.  CN 9,10 (Glossophar): The uvula is midline, the palate elevates symmetrically.  CN 11 (spinal access): Normal sternocleidomastoid and trapezius strength.  CN 12 (Hypoglossal): The tongue is midline. No atrophy or fasciculations.Marland Kitchen   MOTOR:  Muscle Strength: 5/5RUE, 5/5LUE, 5/5RLE, 5/5LLE.   COORDINATION:   No tremor.   SENSATION:   Intact to light touch all four extremities.  GAIT: Gait normal without ataxia       ED Results / Procedures / Treatments   Labs (all labs ordered are listed, but only abnormal results are displayed) Labs Reviewed  RESP PANEL BY RT-PCR (RSV, FLU A&B, COVID)  [id number]    EKG None  Radiology No results found.  Procedures Procedures    Medications Ordered in ED Medications  metoCLOPramide (REGLAN) injection 10 mg (10 mg Intravenous Given 01/30/24 1602)  diphenhydrAMINE (BENADRYL) injection 12.5 mg (12.5 mg Intravenous Given 01/30/24 1604)  acetaminophen (TYLENOL) tablet 1,000 mg (1,000 mg Oral Given 01/30/24 1605)  sodium chloride 0.9 % bolus 1,000 mL (1,000 mLs Intravenous New Bag/Given 01/30/24 1601)    ED Course/ Medical Decision Making/ A&P                                 Medical Decision Making Risk OTC drugs. Prescription drug management.    33 year old female presenting to the emergency department with a headache.  The history is provided with the aid of a Spanish language iPad interpreter.  The patient states that for the last 2 days she has had a headache located primarily on 1 side, throbbing in nature occasionally rating across her forehead with associated nausea and light sensitivity and sound sensitivity.  The patient denies a history of migraine headaches.  She been taking Tylenol at home without relief.  Denies any fevers, chills or neck stiffness.  No falls or trauma.  She denies any neurologic deficits, no vision loss or double vision, no focal weakness or numbness.  Maria Riley is a 33 y.o. female who presents with headache as per above. I have reviewed the nursing documentation for past medical history, family history, and social history.  On arrival, the patient was vitally stable.  Currently she is awake, alert, GCS 15, HDS, and afebrile. Her exam is most notable for fully intact extraocular motions with bilaterally reactive pupils, no focal neurologic deficits, no meningismus, and no temporal tenderness. There is no rash. The headache  was not sudden onset or the worst headache of the patient's life. There is no visual deficit.  I am most concerned for migraine headache.  To further evaluate and risk stratify her, labs and imaging were obtained, which were significant for:  Labs: Covid and Flu and RSV PCR testing was negative   I do not think the patient has an aneurysm, intracranial bleed, mass lesion, meningitis, temporal arteritis, stroke, cluster headache, idiopathic intracranial hypertension, cavernous sinus thrombosis, carbon monoxide toxicity, herpes zoster, carotid or vertebral artery dissection, or acute angle close glaucoma.  On reassessment, the patient remained well appearing and was again able to ambulate without difficulty and did not have any focal neurologic deficits.  I believe the patient  is stable for discharge.  We participated in shared decision making regarding continued observation in the ED versus discharge home for continued recovery after receiving the below medications. She preferred to recover at home. I believe that this is safe and reasonable.  We have discussed the diagnosis and risks, and we agree with discharging home to follow-up with their primary doctor. We also discussed returning to the Emergency Department immediately if new or worsening symptoms occur. We have discussed the symptoms which are most concerning (e.g., changing or worsening pain, weakness, vomiting, fever, or abnormal sensation) that necessitate immediate return. I provided ED return precautions. The patient felt safe with this plan.  ED Medication Summary: Medications  metoCLOPramide (REGLAN) injection 10 mg (10 mg Intravenous Given 01/30/24 1602)  diphenhydrAMINE (BENADRYL) injection 12.5 mg (12.5 mg Intravenous Given 01/30/24 1604)  acetaminophen (TYLENOL) tablet 1,000 mg (1,000 mg Oral Given 01/30/24 1605)  sodium chloride 0.9 % bolus 1,000 mL (1,000 mLs Intravenous New Bag/Given 01/30/24 1601)     Final Clinical  Impression(s) / ED Diagnoses Final diagnoses:  Migraine without status migrainosus, not intractable, unspecified migraine type    Rx / DC Orders ED Discharge Orders     None         Ernie Avena, MD 01/30/24 [phone]

## 2024-01-30 NOTE — ED Triage Notes (Signed)
 Pt has nausea and headache last 2 days. Denies vomiting and diarrhea.

## 2024-02-09 ENCOUNTER — Ambulatory Visit (INDEPENDENT_AMBULATORY_CARE_PROVIDER_SITE_OTHER): Payer: Self-pay | Admitting: Primary Care

## 2024-02-09 NOTE — Telephone Encounter (Signed)
  Chief Complaint: headaches Symptoms: intermittent headaches for 3 months, worsening in the last 3 days Frequency: 3 months Pertinent Negatives: Patient denies headache currently, fever, blurry vision, CP, SOB Disposition: [] ED /[] Urgent Care (no appt availability in office) / [x] Appointment(In office/virtual)/ []  [facility]/ [] Home Care/ [] Refused Recommended Disposition /[] [place] [facility]/ []  Follow-up with PCP Additional Notes: Pt reports headaches for 3 months with recent worsening. However, pt denies a headache currently. Pt was seen 2/23 in the ED, diagnosed with migraines. Pt reports nausea and dizziness with headaches. Symptoms not present at the time of the call. Tylenol and ibuprofen are not helpful. Denies blurry vision, neck pain or stiffness, flu-like symptoms, CP, SOB. Per protocol pt scheduled in office 3/14 at 0950. RN advised pt she needs to call 911 and go to the ED if she develops sudden severe headache, vomiting and unable to keep anything down, one-sided weakness or difficulty walking, and asked that she call us back for any worsening to which she verbalized understanding.   Copied from CRM [id number]. Topic: Clinical - Red Word Triage >> Feb 09, 2024 12:53 PM Tiffany B wrote: Red Word that prompted transfer to Nurse Triage: Patient experiencing headaches for 3 months and headaches have worsened in the past 3 days. Reason for Disposition  Headache is a chronic symptom (recurrent or ongoing AND present > 4 weeks)  Answer Assessment - Initial Assessment Questions 1. LOCATION: "Where does it hurt?"      "On the front by the R eye sometimes, and then it moves to the other side" 2. ONSET: "When did the headache start?" (Minutes, hours or days)      Onset 3 months ago, worse the last 3 days 3. PATTERN: "Does the pain come and go, or has it been constant since it started?"     "One week I am fine, one week I hurt" - intermittent 4. SEVERITY: "How bad is the pain?"  and "What does it keep you from doing?"  (e.g., Scale 1-10; mild, moderate, or severe)   - MILD (1-3): doesn't interfere with normal activities    - MODERATE (4-7): interferes with normal activities or awakens from sleep    - SEVERE (8-10): excruciating pain, unable to do any normal activities        8/10 5. RECURRENT SYMPTOM: "Have you ever had headaches before?" If Yes, ask: "When was the last time?" and "What happened that time?"      Started 3 months ago 6. CAUSE: "What do you think is causing the headache?"     Not sure 7. MIGRAINE: "Have you been diagnosed with migraine headaches?" If Yes, ask: "Is this headache similar?"      Seen in the ED 2/23 8. HEAD INJURY: "Has there been any recent injury to the head?"      No 9. OTHER SYMPTOMS: "Do you have any other symptoms?" (fever, stiff neck, eye pain, sore throat, cold symptoms)     Takes Tylenol or ibuprofen, but states it doesn't help. Nausea, dizziness with headaches. No CP or SOB. "I think I had a fever because I took a hot shower but I felt cold", showers don't help, no eye pain, no neck pain or stiffness, no blurry vision  Protocols used: Headache-A-AH

## 2024-02-17 ENCOUNTER — Telehealth (INDEPENDENT_AMBULATORY_CARE_PROVIDER_SITE_OTHER): Payer: Self-pay | Admitting: Primary Care

## 2024-02-17 NOTE — Telephone Encounter (Signed)
 Spoke to pt about appt.. Will be present

## 2024-02-18 ENCOUNTER — Ambulatory Visit (INDEPENDENT_AMBULATORY_CARE_PROVIDER_SITE_OTHER): Admitting: Primary Care

## 2024-02-18 ENCOUNTER — Encounter (INDEPENDENT_AMBULATORY_CARE_PROVIDER_SITE_OTHER): Payer: Self-pay | Admitting: Primary Care

## 2024-02-18 VITALS — BP 86/57 | HR 73 | Ht 59.0 in | Wt 118.0 lb

## 2024-02-18 DIAGNOSIS — G43109 Migraine with aura, not intractable, without status migrainosus: Secondary | ICD-10-CM

## 2024-02-18 DIAGNOSIS — E039 Hypothyroidism, unspecified: Secondary | ICD-10-CM | POA: Diagnosis not present

## 2024-02-18 MED ORDER — SUMATRIPTAN SUCCINATE 25 MG PO TABS
25.0000 mg | ORAL_TABLET | Freq: Once | ORAL | Status: AC
  Administered 2024-02-18: 25 mg via ORAL

## 2024-02-18 NOTE — Progress Notes (Signed)
 [facility]  Maria Riley, is a 33 y.o. female  NWG:[id number]  [id number]:[id number]  DOB - 1991-08-26  Chief Complaint  Patient presents with   Headache       Subjective:   [name] is a 33 y.o. Hispanic female ( interpreter [place])here today for an acute visit. Presenting to the emergency department on 01/30/24  with a headache.  Patient  headache located bilaterally more frequently on right  throbbing with associated nausea and light sensitivity and sound sensitivity. Rates 7/10 pain uses Tylenol with no relief. Denies any fevers, chills or neck stiffness , no chest pain, No abdominal pain - No Nausea, No new weakness tingling or numbness, No Cough - shortness of breath   No problems updated.  Comprehensive ROS Pertinent positive and negative noted in HPI   No Known Allergies  Past Medical History:  Diagnosis Date   Anemia    Medical history non-contributory    Pulmonary embolism (HCC)     Current Outpatient Medications on File Prior to Visit  Medication Sig Dispense Refill   levothyroxine (SYNTHROID) 50 MCG tablet TAKE 1 TABLET BY MOUTH EVERY DAY 30 tablet 1   fluconazole (DIFLUCAN) 150 MG tablet Take 1 tablet (150 mg total) by mouth daily. (Patient not taking: Reported on 02/18/2024) 1 tablet 1   ibuprofen (ADVIL) 600 MG tablet Take 1 tablet (600 mg total) by mouth every 8 (eight) hours as needed. (Patient not taking: Reported on 02/18/2024) 30 tablet 0   metroNIDAZOLE (FLAGYL) 500 MG tablet Take 1 tablet (500 mg total) by mouth 2 (two) times daily. (Patient not taking: Reported on 02/18/2024) 14 tablet 0   phenazopyridine (PYRIDIUM) 200 MG tablet Take 1 tablet (200 mg total) by mouth 3 (three) times daily. (Patient not taking: Reported on 02/18/2024) 6 tablet 0   No current facility-administered medications on file prior to visit.   Health Maintenance  Topic Date Due   Hepatitis C Screening  Never done   Pap with HPV screening  03/07/2023   Flu Shot   07/08/2023   COVID-19 Vaccine (1 - 2024-25 season) Never done   DTaP/Tdap/Td vaccine (4 - Td or Tdap) 04/26/2028   HIV Screening  Completed   HPV Vaccine  Aged Out    Objective:   Vitals:   02/18/24 1001  BP: (!) 86/57  Pulse: 73  SpO2: 97%  Weight: 118 lb (53.5 kg)  Height: 4\' 11"  (1.499 m)    Physical Exam Vitals reviewed.  Constitutional:      Appearance: Normal appearance.  HENT:     Head: Normocephalic.     Right Ear: Tympanic membrane, ear canal and external ear normal.     Left Ear: Tympanic membrane, ear canal and external ear normal.     Nose: Nose normal.     Mouth/Throat:     Mouth: Mucous membranes are moist.  Eyes:     Extraocular Movements: Extraocular movements intact.     Pupils: Pupils are equal, round, and reactive to light.  Cardiovascular:     Rate and Rhythm: Normal rate.  Pulmonary:     Effort: Pulmonary effort is normal.     Breath sounds: Normal breath sounds.  Abdominal:     General: Bowel sounds are normal.     Palpations: Abdomen is soft.  Musculoskeletal:        General: Normal range of motion.     Cervical back: Normal range of motion.  Skin:    General: Skin is warm  and dry.  Neurological:     Mental Status: She is alert and oriented to person, place, and time.     Comments: Migraines   Psychiatric:        Mood and Affect: Mood normal.        Behavior: Behavior normal.        Thought Content: Thought content normal.       Assessment & Plan   Jeany was seen today for headache.  Diagnoses and all orders for this visit:  Migraine with aura and without status migrainosus, not intractable Migraine at appt sample given Sumatriptan no relief .  Ibuprofen provided no relief blood pressure generalized low will refer to neurology  Hypothyroidism, unspecified type -     TSH + free T4    Patient have been counseled extensively about nutrition and exercise. Other issues discussed during this visit include: low cholesterol diet,  weight control and daily exercise, foot care, annual eye examinations at Ophthalmology, importance of adherence with medications and regular follow-up. We also discussed long term complications of uncontrolled diabetes and hypertension.   No follow-ups on file.  The patient was given clear instructions to go to ER or return to medical center if symptoms don't improve, worsen or new problems develop. The patient verbalized understanding. The patient was told to call to get lab results if they haven't heard anything in the next week.   This note has been created with Education officer, environmental. Any transcriptional errors are unintentional.   [name], NP 02/18/2024, 10:20 AM

## 2024-02-19 LAB — TSH+FREE T4
Free T4: 1.29 ng/dL (ref 0.82–1.77)
TSH: 0.396 u[IU]/mL — ABNORMAL LOW (ref 0.450–4.500)

## 2024-02-28 ENCOUNTER — Encounter: Payer: Self-pay | Admitting: Neurology

## 2024-03-23 ENCOUNTER — Other Ambulatory Visit (INDEPENDENT_AMBULATORY_CARE_PROVIDER_SITE_OTHER)

## 2024-03-30 ENCOUNTER — Other Ambulatory Visit (INDEPENDENT_AMBULATORY_CARE_PROVIDER_SITE_OTHER)

## 2024-03-30 ENCOUNTER — Other Ambulatory Visit (HOSPITAL_COMMUNITY)
Admission: RE | Admit: 2024-03-30 | Discharge: 2024-03-30 | Disposition: A | Discharge status: Home / Self Care | Source: Ambulatory Visit | Attending: Primary Care | Admitting: Primary Care

## 2024-03-30 DIAGNOSIS — E039 Hypothyroidism, unspecified: Secondary | ICD-10-CM

## 2024-03-30 DIAGNOSIS — Z113 Encounter for screening for infections with a predominantly sexual mode of transmission: Secondary | ICD-10-CM | POA: Diagnosis present

## 2024-03-30 DIAGNOSIS — R300 Dysuria: Secondary | ICD-10-CM | POA: Diagnosis not present

## 2024-03-30 DIAGNOSIS — N898 Other specified noninflammatory disorders of vagina: Secondary | ICD-10-CM

## 2024-03-30 LAB — POCT URINALYSIS DIP (CLINITEK)
Bilirubin, UA: NEGATIVE
Blood, UA: NEGATIVE
Glucose, UA: NEGATIVE mg/dL
Ketones, POC UA: NEGATIVE mg/dL
Nitrite, UA: NEGATIVE
POC PROTEIN,UA: NEGATIVE
Spec Grav, UA: 1.02 (ref 1.010–1.025)
Urobilinogen, UA: 0.2 U/dL
pH, UA: 7 (ref 5.0–8.0)

## 2024-03-30 NOTE — Addendum Note (Signed)
 Addended by: Kathryne Parisian on: 03/30/2024 09:50 AM   Modules accepted: Orders

## 2024-03-30 NOTE — Progress Notes (Addendum)
 Pt came into the office today for lab work for her thyroid   Pt is also requesting a urine dip and vaginal swab for discharge   Pt states she has discharge

## 2024-03-30 NOTE — Progress Notes (Signed)
 Initial neurology clinic note  Maria Riley MRN: [id number] DOB: 1991/11/26  Referring provider: Marius Siemens, NP  Primary care provider: Marius Siemens, NP  Reason for consult:  headaches  Subjective:  This is [name], a 33 y.o. right-handed female with a medical history of hypothyroidism who presents to neurology clinic with headaches. The patient is accompanied by Spanish language interpreter.  Patient has not had any recent headaches. She went to ED on 01/30/24. She had a headache for 4 days at that time. Tylenol  was not helping. Pain was around 7/10. She then saw her PCP on 02/18/24. She has not had a headache since then. The pain can be on either the right or left side. It is a throbbing pain. She first had headaches about 3 years ago. Headaches can last 3 days. She typically has about 1 headache per week. She endorses photophobia, phonophobia, nausea, and vomiting. She has imbalance but no vision loss. She prefers to be in cold dark room when she has a headache.  There is mention of PCP giving sample of sumatriptan  but patient does not remember this. She does not think she has been given HA medications.  Smoker: No OCP use: No, cannot have children anymore; had multiple surgeries (?hysterectomy) Caffiene use: occasional sweat tea EtOH use: None Restrictive diet: No Family history of neurologic disease, including headaches: No   MEDICATIONS:  Outpatient Encounter Medications as of 04/13/2024  Medication Sig   fluconazole  (DIFLUCAN ) 150 MG tablet Take 1 tablet (150 mg total) by mouth daily.   levothyroxine  (SYNTHROID ) 50 MCG tablet Take 1 tablet (50 mcg total) by mouth daily.   metroNIDAZOLE  (FLAGYL ) 500 MG tablet Take 1 tablet (500 mg total) by mouth 2 (two) times daily.   ondansetron  (ZOFRAN -ODT) 4 MG disintegrating tablet Take 1 tablet (4 mg total) by mouth every 8 (eight) hours as needed.   rizatriptan  (MAXALT ) 10 MG tablet Take 1 tablet (10 mg  total) by mouth as needed for migraine. May repeat in 2 hours if needed   [DISCONTINUED] levothyroxine  (SYNTHROID ) 50 MCG tablet TAKE 1 TABLET BY MOUTH EVERY DAY   No facility-administered encounter medications on file as of 04/13/2024.    PAST MEDICAL HISTORY: Past Medical History:  Diagnosis Date   Anemia    Medical history non-contributory    Pulmonary embolism (HCC)     PAST SURGICAL HISTORY: Past Surgical History:  Procedure Laterality Date   ABDOMINAL HYSTERECTOMY     CESAREAN SECTION N/A 06/13/2018   Procedure: CESAREAN SECTION;  Surgeon: Raynell Caller, MD;  Location: [facility];  Service: Obstetrics;  Laterality: N/A;   IR CATHETER TUBE CHANGE  02/05/2022   IR IMAGE GUIDED DRAINAGE BY PERCUTANEOUS CATHETER  02/18/2022   IR RADIOLOGIST EVAL & MGMT  02/04/2022   IR RADIOLOGIST EVAL & MGMT  02/12/2022   IR RADIOLOGIST EVAL & MGMT  03/11/2022   LYSIS OF ADHESION N/A 08/19/2022   Procedure: LYSIS OF ADHESIONS;  Surgeon: Candyce Champagne, MD;  Location: [facility];  Service: General;  Laterality: N/A;   WISDOM TOOTH EXTRACTION Bilateral    XI ROBOTIC ASSISTED COLOSTOMY TAKEDOWN N/A 08/19/2022   Procedure: XI ROBOTIC ASSISTED END ILEOSTOMY TAKEDOWN;  Surgeon: Candyce Champagne, MD;  Location: [facility];  Service: General;  Laterality: N/A;    ALLERGIES: No Known Allergies  FAMILY HISTORY: Family History  Problem Relation Age of Onset   Healthy Father    Alcohol abuse Neg Hx    Arthritis Neg  Hx    Asthma Neg Hx    Birth defects Neg Hx    Cancer Neg Hx    COPD Neg Hx    Depression Neg Hx    Diabetes Neg Hx    Drug abuse Neg Hx    Early death Neg Hx    Hearing loss Neg Hx    Heart disease Neg Hx    Hyperlipidemia Neg Hx    Hypertension Neg Hx    Kidney disease Neg Hx    Learning disabilities Neg Hx    Mental illness Neg Hx    Mental retardation Neg Hx    Miscarriages / Stillbirths Neg Hx    Stroke Neg Hx    Vision loss Neg Hx    Varicose Veins Neg Hx     SOCIAL  HISTORY: Social History   Tobacco Use   Smoking status: Never   Smokeless tobacco: Never  Vaping Use   Vaping status: Never Used  Substance Use Topics   Alcohol use: No   Drug use: No   Social History   Social History Narrative   Are you right handed or left handed? Right   Are you currently employed ? no   What is your current occupation?    Do you live at home alone?   Who lives with you? kids   What type of home do you live in: 1 story or 2 story? one    Caffeine  none    Objective:  Vital Signs:  BP 92/61   Pulse 60   Ht 4\' 11"  (1.499 m)   Wt 121 lb (54.9 kg)   LMP 06/08/2021 (Approximate)   SpO2 100%   BMI 24.44 kg/m   General: No acute distress.  Patient appears well-groomed.   Head:  Normocephalic/atraumatic Eyes:  fundi examined, disc margins clear, no obvious papilledema Neck: supple, no paraspinal tenderness, full range of motion Heart: regular rate and rhythm Lungs: Clear to auscultation bilaterally. Vascular: No carotid bruits.  Neurological Exam: Mental status: alert and oriented, speech fluent and not dysarthric, language intact.  Cranial nerves: CN I: not tested CN II: pupils equal, round and reactive to light, visual fields intact CN III, IV, VI:  full range of motion, no nystagmus, no ptosis CN V: facial sensation intact. CN VII: upper and lower face symmetric CN VIII: hearing intact CN IX, X: uvula midline CN XI: sternocleidomastoid and trapezius muscles intact CN XII: tongue midline  Bulk & Tone: normal, no fasciculations. Motor:  muscle strength 5/5 throughout Deep Tendon Reflexes:  2+ throughout   Sensation:  Light touch sensation intact. Finger to nose testing:  Without dysmetria.     Gait:  Normal station and stride.  Romberg negative.   Labs and Imaging review: Internal labs: TSH (02/18/24): low at 0.396; normal on 03/30/24 CBC w/ diff (01/15/23): unremarkable BMP (08/22/22): Ca low at 7.9  Assessment/Plan:  Maria Riley is  a 33 y.o. female who presents for evaluation of headaches. She has a relevant medical history of hypothryoidism. Her neurological examination is essentially normal today. Available diagnostic data is significant for low TSH on 02/18/24 but normal on 03/30/24. 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 Her hypothyroidism may have been contributing as well. She has up to 4-5 headaches per month but had no headaches in the last 1.5 months. I will start with rescue medication but may start a preventative if headaches increase.   PLAN: -Blood work: B12 -For migraines: Migraine prevention: Will  defer for now due to infrequency of headaches currently Migraine rescue:  Maxalt  10 mg as needed at headache onset, can repeat in 2 hours if needed. Zofran  4 mg under the tongue as needed for nausea Limit use of pain relievers to no more than 2 days out of week to prevent risk of rebound or medication-overuse headache. Keep headache diary  -Return to clinic in 3 months  The impression above as well as the plan as outlined below were extensively discussed with the patient (in the company of translator) who voiced understanding. All questions were answered to their satisfaction.  When available, results of the above investigations and possible further recommendations will be communicated to the patient via telephone/MyChart. Patient to call office if not contacted after expected testing turnaround time.   Total time spent reviewing records, interview, history/exam, documentation, and coordination of care on day of encounter:  45 min   Thank you for allowing me to participate in patient's care.  If I can answer any additional questions, I would be pleased to do so.  Rommie Coats, MD   CC: Marius Siemens, NP [address] [place] [address]  CC: Referring provider: Marius Siemens, NP [address] [place],  [place] [address]

## 2024-03-31 LAB — CERVICOVAGINAL ANCILLARY ONLY
Bacterial Vaginitis (gardnerella): POSITIVE — AB
Candida Glabrata: NEGATIVE
Candida Vaginitis: NEGATIVE
Chlamydia: NEGATIVE
Comment: NEGATIVE
Comment: NEGATIVE
Comment: NEGATIVE
Comment: NEGATIVE
Comment: NEGATIVE
Comment: NORMAL
Neisseria Gonorrhea: NEGATIVE
Trichomonas: NEGATIVE

## 2024-03-31 LAB — TSH+FREE T4
Free T4: 0.95 ng/dL (ref 0.82–1.77)
TSH: 0.817 u[IU]/mL (ref 0.450–4.500)

## 2024-04-01 ENCOUNTER — Other Ambulatory Visit (INDEPENDENT_AMBULATORY_CARE_PROVIDER_SITE_OTHER): Payer: Self-pay | Admitting: Primary Care

## 2024-04-01 DIAGNOSIS — N760 Acute vaginitis: Secondary | ICD-10-CM

## 2024-04-01 DIAGNOSIS — E039 Hypothyroidism, unspecified: Secondary | ICD-10-CM

## 2024-04-01 MED ORDER — METRONIDAZOLE 500 MG PO TABS: 500.0000 mg | ORAL_TABLET | Freq: Two times a day (BID) | ORAL | 0 refills | Status: DC

## 2024-04-01 MED ORDER — FLUCONAZOLE 150 MG PO TABS: 150.0000 mg | ORAL_TABLET | Freq: Every day | ORAL | 1 refills | Status: DC

## 2024-04-01 MED ORDER — LEVOTHYROXINE SODIUM 50 MCG PO TABS: 50.0000 ug | ORAL_TABLET | Freq: Every day | ORAL | 1 refills | Status: DC

## 2024-04-04 ENCOUNTER — Other Ambulatory Visit: Payer: Self-pay

## 2024-04-04 DIAGNOSIS — E039 Hypothyroidism, unspecified: Secondary | ICD-10-CM

## 2024-04-13 ENCOUNTER — Encounter: Payer: Self-pay | Admitting: Neurology

## 2024-04-13 ENCOUNTER — Ambulatory Visit: Admitting: Neurology

## 2024-04-13 ENCOUNTER — Other Ambulatory Visit

## 2024-04-13 VITALS — BP 92/61 | HR 60 | Ht 59.0 in | Wt 121.0 lb

## 2024-04-13 DIAGNOSIS — F40298 Other specified phobia: Secondary | ICD-10-CM | POA: Diagnosis not present

## 2024-04-13 DIAGNOSIS — R112 Nausea with vomiting, unspecified: Secondary | ICD-10-CM

## 2024-04-13 DIAGNOSIS — G43009 Migraine without aura, not intractable, without status migrainosus: Secondary | ICD-10-CM

## 2024-04-13 DIAGNOSIS — H53149 Visual discomfort, unspecified: Secondary | ICD-10-CM

## 2024-04-13 LAB — VITAMIN B12: Vitamin B-12: 543 pg/mL (ref 200–1100)

## 2024-04-13 MED ORDER — ONDANSETRON 4 MG PO TBDP: 4.0000 mg | ORAL_TABLET | Freq: Three times a day (TID) | ORAL | 3 refills | Status: DC | PRN

## 2024-04-13 MED ORDER — RIZATRIPTAN BENZOATE 10 MG PO TABS: 10.0000 mg | ORAL_TABLET | ORAL | 5 refills | Status: AC | PRN

## 2024-04-13 NOTE — Patient Instructions (Addendum)
 I saw you today for headaches. It sounds like you are having migraines.  I would like to get blood work today. I will be in touch when I have those results.  For your headaches: -Take Maxalt 10 mg as needed the minute you feel a headache. Do not wait for it to get bad. You can repeat in 2 hours if needed. -Take Zofran  under the tongue as needed for nausea.  I have sent both of these to your pharmacy. If your headaches worsen, please let me know as I can make changes.  I will see you back in clinic in about 3 months.  Please let me know if you have any questions or concerns in the meantime.  The physicians and staff at [facility] are committed to providing excellent care. You may receive a survey requesting feedback about your experience at our office. We strive to receive "very good" responses to the survey questions. If you feel that your experience would prevent you from giving the office a "very good " response, please contact our office to try to remedy the situation. We may be reached at [phone]. Thank you for taking the time out of your busy day to complete the survey.  Rommie Coats, MD [facility]   More migraine information: Be aware of common food triggers:  - Caffeine :  coffee, black tea, cola, Mt. Dew  - Chocolate  - Dairy:  aged cheeses (brie, blue, cheddar, gouda, Parmasan, provolone, romano, Swiss, etc), chocolate milk, buttermilk, sour cream, limit eggs and yogurt  - Nuts, peanut butter  - Alcohol  - Cereals/grains:  FRESH breads (fresh bagels, sourdough, doughnuts), yeast productions  - Processed/canned/aged/cured meats (pre-packaged deli meats, hotdogs)  - MSG/glutamate:  soy sauce, flavor enhancer, pickled/preserved/marinated foods  - Sweeteners:  aspartame (Equal, Nutrasweet).  Sugar and Splenda are okay  - Vegetables:  legumes (lima beans, lentils, snow peas, fava beans, pinto peans, peas, garbanzo beans), sauerkraut, onions, olives, pickles  -  Fruit:  avocados, bananas, citrus fruit (orange, lemon, grapefruit), mango  - Other:  Frozen meals, macaroni and cheese Routine exercise Stay adequately hydrated (aim for 64 oz water  daily) Keep headache diary Maintain proper stress management Maintain proper sleep hygiene Do not skip meals Consider supplements:  magnesium  citrate 400mg  daily, riboflavin 400mg  daily, coenzyme Q10 100mg  three times daily.  Per Google translate:  Lo visit hoy por dolores de cabeza. Parece que tiene migraas.  Me gustara hacerme un anlisis de sangre hoy. Me pondr en contacto con usted cuando [employer].  Para sus dolores de cabeza: - Tome Maxalt 10 mg segn sea necesario en cuanto sienta dolor de cabeza. No espere a que empeore. Puede repetir la dosis en 2 horas si es necesario. - Tome Zofran  sublingual segn sea necesario para las nuseas.  He enviado ambos medicamentos a su farmacia. Si sus dolores de [place], por favor, avseme para que pueda [employer].  Lo ver de nuevo en la clnica en unos 3 meses.  Mientras tanto, por favor, avseme si tiene alguna pregunta o inquietud.  Los mdicos y el personal de Adult nurse Neurology se comprometen a brindar una atencin excelente. Es posible que reciba una encuesta solicitando su opinin sobre su experiencia en nuestra clnica. Nos esforzamos por recibir respuestas "muy positivas" a las preguntas de la encuesta. Si considera que su experiencia le impide calificar la consulta con una "muy buena", comunquese con nuestra oficina para intentar solucionar la situacin. Puede comunicarse con nosotros al [phone]. Gracias  por dedicar su tiempo a completar la encuesta.  Rommie Coats, MD [facility]  Ms informacin sobre la migraa: 1. Tenga en cuenta los desencadenantes alimentarios comunes: - Cafena: caf, t negro, cola, Mt. Dew - Chocolate - Lcteos: quesos curados (brie, [place], [place], [place], [place], provolone, romano,  suizo, Catering manager.), leche chocolatada, suero de leche, crema agria; limite el consumo de [place] y Dentist - Frutos secos, Hydrographic surveyor de [place] - Alcohol - Cereales/granos: pan fresco (bagels frescos, masa madre, donas), productos con levadura - Carnes procesadas/enlatadas/curadas (fiambres preenvasados, perritos calientes) - Glutamato monosdico/glutamato monosdico: salsa de soja, potenciador del sabor, alimentos encurtidos/conservados/marinados - Edulcorantes: aspartamo (Equal, Nutrasweet). El azcar y la Splenda estn bien. - Verduras: legumbres (alubias, lentejas, guisantes, habas, cacahu[place] pintos, guisantes, garbanzos), chucrut, cebollas, aceitunas, pepinillos. - Frutas: aguacates, pltanos, ctricos (naranja, limn, pomelo), mango. - Otros: Comidas congeladas, macarrones con queso. 2. Ejercicio regular. 3. Mantn una hidratacin adecuada (bebe 1 litro de Scientist, research (medical)). 4. Lleva un registro de tus dolores de cabeza. 5. Mantn un manejo adecuado del estrs. 6. Mantn una buena higiene del sueo. 7. No te saltes comidas. 8. Considera tomar suplementos: citrato de magnesio 400 mg al da, riboflavina 400 mg al da, coenzima Q10 100 mg tres veces al [employer].

## 2024-04-26 DIAGNOSIS — N898 Other specified noninflammatory disorders of vagina: Secondary | ICD-10-CM | POA: Diagnosis not present

## 2024-04-26 DIAGNOSIS — Z113 Encounter for screening for infections with a predominantly sexual mode of transmission: Secondary | ICD-10-CM | POA: Diagnosis not present

## 2024-05-02 ENCOUNTER — Other Ambulatory Visit (INDEPENDENT_AMBULATORY_CARE_PROVIDER_SITE_OTHER): Payer: Self-pay | Admitting: Primary Care

## 2024-05-02 DIAGNOSIS — E039 Hypothyroidism, unspecified: Secondary | ICD-10-CM

## 2024-05-03 ENCOUNTER — Other Ambulatory Visit (INDEPENDENT_AMBULATORY_CARE_PROVIDER_SITE_OTHER): Payer: Self-pay

## 2024-05-03 DIAGNOSIS — E039 Hypothyroidism, unspecified: Secondary | ICD-10-CM

## 2024-05-03 MED ORDER — LEVOTHYROXINE SODIUM 50 MCG PO TABS: 50.0000 ug | ORAL_TABLET | Freq: Every day | ORAL | 0 refills | Status: DC

## 2024-05-12 ENCOUNTER — Other Ambulatory Visit (INDEPENDENT_AMBULATORY_CARE_PROVIDER_SITE_OTHER): Payer: Self-pay

## 2024-05-12 DIAGNOSIS — E039 Hypothyroidism, unspecified: Secondary | ICD-10-CM

## 2024-05-12 NOTE — Telephone Encounter (Signed)
 Copied from CRM [id number]. Topic: Clinical - Medication Refill >> May 12, 2024  2:41 PM Emylou G wrote: Medication: levothyroxine  (SYNTHROID ) 50 MCG tablet  Has the patient contacted their pharmacy? No (Agent: If no, request that the patient contact the pharmacy for the refill. If patient does not wish to contact the pharmacy document the reason why and proceed with request.) (Agent: If yes, when and what did the pharmacy advise?)  This is the patient's preferred pharmacy:  [id number] [place], [place] - [address] [place] [address] Phone: [phone] Fax: [phone]  Is this the correct pharmacy for this prescription? Yes If no, delete pharmacy and type the correct one.   Has the prescription been filled recently? No  Is the patient out of the medication? Yes  Has the patient been seen for an appointment in the last year OR does the patient have an upcoming appointment? Yes  Can we respond through MyChart? No  Agent: Please be advised that Rx refills may take up to 3 business days. We ask that you follow-up with your pharmacy.

## 2024-05-15 ENCOUNTER — Emergency Department (HOSPITAL_COMMUNITY)

## 2024-05-15 ENCOUNTER — Emergency Department (HOSPITAL_COMMUNITY)
Admission: EM | Admit: 2024-05-15 | Discharge: 2024-05-15 | Disposition: A | Discharge status: Home / Self Care | Attending: Emergency Medicine | Admitting: Emergency Medicine

## 2024-05-15 DIAGNOSIS — N200 Calculus of kidney: Secondary | ICD-10-CM | POA: Diagnosis not present

## 2024-05-15 DIAGNOSIS — Z79899 Other long term (current) drug therapy: Secondary | ICD-10-CM | POA: Diagnosis not present

## 2024-05-15 DIAGNOSIS — E039 Hypothyroidism, unspecified: Secondary | ICD-10-CM | POA: Diagnosis not present

## 2024-05-15 DIAGNOSIS — R109 Unspecified abdominal pain: Secondary | ICD-10-CM | POA: Diagnosis not present

## 2024-05-15 DIAGNOSIS — N179 Acute kidney failure, unspecified: Secondary | ICD-10-CM | POA: Diagnosis not present

## 2024-05-15 DIAGNOSIS — N3000 Acute cystitis without hematuria: Secondary | ICD-10-CM | POA: Diagnosis not present

## 2024-05-15 DIAGNOSIS — R300 Dysuria: Secondary | ICD-10-CM | POA: Diagnosis present

## 2024-05-15 DIAGNOSIS — R6883 Chills (without fever): Secondary | ICD-10-CM | POA: Diagnosis not present

## 2024-05-15 LAB — CBC WITH DIFFERENTIAL/PLATELET
Abs Immature Granulocytes: 0.04 10*3/uL (ref 0.00–0.07)
Basophils Absolute: 0 10*3/uL (ref 0.0–0.1)
Basophils Relative: 0 %
Eosinophils Absolute: 0.1 10*3/uL (ref 0.0–0.5)
Eosinophils Relative: 1 %
HCT: 37.2 % (ref 36.0–46.0)
Hemoglobin: 12.1 g/dL (ref 12.0–15.0)
Immature Granulocytes: 1 %
Lymphocytes Relative: 35 %
Lymphs Abs: 1.8 10*3/uL (ref 0.7–4.0)
MCH: 28.7 pg (ref 26.0–34.0)
MCHC: 32.5 g/dL (ref 30.0–36.0)
MCV: 88.2 fL (ref 80.0–100.0)
Monocytes Absolute: 0.3 10*3/uL (ref 0.1–1.0)
Monocytes Relative: 7 %
Neutro Abs: 2.9 10*3/uL (ref 1.7–7.7)
Neutrophils Relative %: 56 %
Platelets: 229 10*3/uL (ref 150–400)
RBC: 4.22 MIL/uL (ref 3.87–5.11)
RDW: 13.4 % (ref 11.5–15.5)
WBC: 5.1 10*3/uL (ref 4.0–10.5)
nRBC: 0 % (ref 0.0–0.2)

## 2024-05-15 LAB — BASIC METABOLIC PANEL WITH GFR
Anion gap: 5 (ref 5–15)
BUN: 18 mg/dL (ref 6–20)
CO2: 25 mmol/L (ref 22–32)
Calcium: 8.6 mg/dL — ABNORMAL LOW (ref 8.9–10.3)
Chloride: 108 mmol/L (ref 98–111)
Creatinine, Ser: 0.74 mg/dL (ref 0.44–1.00)
GFR, Estimated: >60 mL/min (ref >60)
Glucose, Bld: 83 mg/dL (ref 70–99)
Potassium: 3.7 mmol/L (ref 3.5–5.1)
Sodium: 138 mmol/L (ref 135–145)

## 2024-05-15 LAB — PREGNANCY, URINE: Preg Test, Ur: NEGATIVE

## 2024-05-15 LAB — URINALYSIS, ROUTINE W REFLEX MICROSCOPIC
Bilirubin Urine: NEGATIVE
Glucose, UA: NEGATIVE mg/dL
Hgb urine dipstick: NEGATIVE
Ketones, ur: NEGATIVE mg/dL
Nitrite: NEGATIVE
Protein, ur: 30 mg/dL — AB
Specific Gravity, Urine: 1.021 (ref 1.005–1.030)
pH: 8 (ref 5.0–8.0)

## 2024-05-15 LAB — COMPREHENSIVE METABOLIC PANEL WITH GFR
ALT: 20 U/L (ref 0–44)
AST: 30 U/L (ref 15–41)
Albumin: 4.5 g/dL (ref 3.5–5.0)
Alkaline Phosphatase: 85 U/L (ref 38–126)
Anion gap: 9 (ref 5–15)
BUN: 23 mg/dL — ABNORMAL HIGH (ref 6–20)
CO2: 25 mmol/L (ref 22–32)
Calcium: 9.4 mg/dL (ref 8.9–10.3)
Chloride: 107 mmol/L (ref 98–111)
Creatinine, Ser: 1.19 mg/dL — ABNORMAL HIGH (ref 0.44–1.00)
GFR, Estimated: >60 mL/min (ref >60)
Glucose, Bld: 79 mg/dL (ref 70–99)
Potassium: 3.7 mmol/L (ref 3.5–5.1)
Sodium: 141 mmol/L (ref 135–145)
Total Bilirubin: 0.5 mg/dL (ref 0.0–1.2)
Total Protein: 7.6 g/dL (ref 6.5–8.1)

## 2024-05-15 MED ORDER — LACTATED RINGERS IV BOLUS
1000.0000 mL | Freq: Once | INTRAVENOUS | Status: AC
  Administered 2024-05-15: 1000 mL via INTRAVENOUS

## 2024-05-15 MED ORDER — ACETAMINOPHEN 500 MG PO TABS
1000.0000 mg | ORAL_TABLET | Freq: Once | ORAL | Status: AC
  Administered 2024-05-15: 1000 mg via ORAL
  Filled 2024-05-15: qty 2

## 2024-05-15 MED ORDER — CEFDINIR 300 MG PO CAPS: 300.0000 mg | ORAL_CAPSULE | Freq: Two times a day (BID) | ORAL | 0 refills | Status: DC

## 2024-05-15 MED ORDER — AMOXICILLIN-POT CLAVULANATE 875-125 MG PO TABS: 1.0000 | ORAL_TABLET | Freq: Two times a day (BID) | ORAL | 0 refills | Status: DC

## 2024-05-15 NOTE — Telephone Encounter (Signed)
 Called pt - LMOM that rx was ready to be picked up.  Called pharmacy - medication is filled and waiting to be picked up.

## 2024-05-15 NOTE — Telephone Encounter (Signed)
 Requested Prescriptions  Refused Prescriptions Disp Refills   levothyroxine  (SYNTHROID ) 50 MCG tablet 90 tablet 0    Sig: Take 1 tablet (50 mcg total) by mouth daily.     Endocrinology:  Hypothyroid Agents Failed - 05/15/2024 11:57 AM      Failed - Valid encounter within last 12 months    Recent Outpatient Visits           2 months ago Migraine with aura and without status migrainosus, not intractable   [place] [facility] Marius Siemens, NP   1 year ago Acute cystitis with hematuria   [place] [facility] Marius Siemens, NP   1 year ago Hypothyroidism, unspecified type   [place] [facility] Marius Siemens, NP   1 year ago Other viral warts   [place] [facility] Marius Siemens, NP   1 year ago Acute tonsillitis due to other specified organisms   [place] [facility] Marius Siemens, NP              Passed - TSH in normal range and within 360 days    TSH  Date Value Ref Range Status  03/30/2024 0.817 0.450 - 4.500 uIU/mL Final

## 2024-05-15 NOTE — ED Provider Notes (Signed)
 [place] EMERGENCY DEPARTMENT AT [facility] Provider Note   CSN: [id number] Arrival date & time: 05/15/24  1527     History No chief complaint on file.   Maria Riley is a 33 y.o. female with history of hypothyroidism and migraines presents emerged from today for evaluation of 1 week of dysuria with some bilateral lower back pain.  Denies any hematuria or fever but has some chills.  Did have 1 episode of nausea on Saturday but no vomiting.  Has not felt nauseous since.  Does have some suprapubic pressure but no vaginal discharge or vaginal bleeding reported.  Patient reports that she does have a history of UTIs and this does feel similar to this.  HPI     Home Medications Prior to Admission medications   Medication Sig Start Date End Date Taking? Authorizing Provider  fluconazole  (DIFLUCAN ) 150 MG tablet Take 1 tablet (150 mg total) by mouth daily. 04/01/24   Marius Siemens, NP  levothyroxine  (SYNTHROID ) 50 MCG tablet Take 1 tablet (50 mcg total) by mouth daily. 05/03/24   Marius Siemens, NP  metroNIDAZOLE  (FLAGYL ) 500 MG tablet Take 1 tablet (500 mg total) by mouth 2 (two) times daily. 04/01/24   Marius Siemens, NP  ondansetron  (ZOFRAN -ODT) 4 MG disintegrating tablet Take 1 tablet (4 mg total) by mouth every 8 (eight) hours as needed. 04/13/24   Ellene Gustin, MD  rizatriptan  (MAXALT ) 10 MG tablet Take 1 tablet (10 mg total) by mouth as needed for migraine. May repeat in 2 hours if needed 04/13/24   Ellene Gustin, MD      Allergies    Patient has no known allergies.    Review of Systems   Review of Systems  Constitutional:  Positive for chills. Negative for fever.  Gastrointestinal:  Positive for abdominal pain and nausea. Negative for constipation, diarrhea and vomiting.  Genitourinary:  Positive for dysuria and flank pain. Negative for hematuria, vaginal bleeding and vaginal discharge.    Physical Exam Updated Vital Signs BP 98/70 (BP Location:  Right Arm)   Pulse 82   Temp 97.8 F (36.6 C) (Oral)   Resp 18   LMP 06/08/2021 (Approximate)   SpO2 100%  Physical Exam Vitals and nursing note reviewed.  Constitutional:      General: She is not in acute distress.    Appearance: She is not toxic-appearing.  HENT:     Mouth/Throat:     Mouth: Mucous membranes are moist.     Comments: Braces present on teeth Cardiovascular:     Rate and Rhythm: Normal rate.  Pulmonary:     Effort: Pulmonary effort is normal. No respiratory distress.  Abdominal:     Palpations: Abdomen is soft.     Tenderness: There is no guarding or rebound.     Comments: Abdomen is soft.  Patient has the sensation to urinate with palpation of the suprapubic area but not painful.  No guarding or rebound.  Skin:    General: Skin is warm and dry.  Neurological:     Mental Status: She is alert.     ED Results / Procedures / Treatments   Labs (all labs ordered are listed, but only abnormal results are displayed) Labs Reviewed  URINALYSIS, ROUTINE W REFLEX MICROSCOPIC - Abnormal; Notable for the following components:      Result Value   APPearance CLOUDY (*)    Protein, ur 30 (*)    Leukocytes,Ua LARGE (*)  Bacteria, UA MANY (*)    All other components within normal limits  COMPREHENSIVE METABOLIC PANEL WITH GFR - Abnormal; Notable for the following components:   BUN 23 (*)    Creatinine, Ser 1.19 (*)    All other components within normal limits  URINE CULTURE  CBC WITH DIFFERENTIAL/PLATELET  PREGNANCY, URINE  BASIC METABOLIC PANEL WITH GFR    EKG None  Radiology CT Renal Stone Study Result Date: 05/15/2024 CLINICAL DATA:  Abdominal/flank pain, stone suspected, lower back pain, painful urination and dark urine, body chills and aches history of UTIs EXAM: CT ABDOMEN AND PELVIS WITHOUT CONTRAST TECHNIQUE: Multidetector CT imaging of the abdomen and pelvis was performed following the standard protocol without IV contrast. RADIATION DOSE REDUCTION:  This exam was performed according to the departmental dose-optimization program which includes automated exposure control, adjustment of the mA and/or kV according to patient size and/or use of iterative reconstruction technique. COMPARISON:  CT abdomen pelvis 01/06/2023 FINDINGS: Lower chest: No acute abnormality. Hepatobiliary: Unremarkable liver. Normal gallbladder. No biliary dilation. Pancreas: Unremarkable. Spleen: Unremarkable. Adrenals/Urinary Tract: Normal adrenal glands. Tiny 1 mm nonobstructing left nephrolithiasis. No ureteral calculi or hydronephrosis. Bladder is unremarkable. 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 Residual scarring at the site of ileostomy take down in the right lower quadrant. Moderate colonic stool burden. Normal caliber large and small bowel. No bowel wall thickening. Stomach is within normal limits. Vascular/Lymphatic: No significant vascular findings are present. No enlarged abdominal or pelvic lymph nodes. Reproductive: Hysterectomy.  No adnexal mass. Other: No free intraperitoneal fluid or air. Musculoskeletal: No acute fracture. IMPRESSION: 1. No acute abnormality in the abdomen or pelvis. 2. Tiny 1 mm nonobstructing left nephrolithiasis. No ureteral calculi or hydronephrosis. 3. Moderate colonic stool burden. Electronically Signed   By: Rozell Cornet M.D.   On: 05/15/2024 19:35    Procedures Procedures    Medications Ordered in ED Medications  lactated ringers  bolus 1,000 mL (0 mLs Intravenous Stopped 05/15/24 1858)  acetaminophen  (TYLENOL ) tablet 1,000 mg (1,000 mg Oral Given 05/15/24 1733)  lactated ringers  bolus 1,000 mL (1,000 mLs Intravenous New Bag/Given 05/15/24 1858)    ED Course/ Medical Decision Making/ A&P                                Medical Decision Making Amount and/or Complexity of Data Reviewed Labs: ordered. Radiology: ordered.  Risk OTC drugs. Prescription drug  management.  33 y.o. female presents to the ER for evaluation of dysuria. Differential diagnosis includes but is not limited to UTI, pyelonephritis, STD, urethritis, nephrolithiasis, PID, interstitial cystitis . Vital signs blood pressure borderline at 9359 however from previous chart evaluation, this does appear to be around patient's baseline.  Afebrile, nontachycardic. Physical exam as noted above.   Patient having symptoms for the past week.  Will order labs and CT imaging to rule out any infectious stone given her bilateral flank pain.  I independently reviewed and interpreted the patient's labs. Urinalysis shows cloudy urine with 30 protein present.  Large leuks with 21-50 white blood cells and many bacteria present.  Some squam epithelial present.  Mucus, triple phosphate crystals, and calcium  oxalate crystals present as well.  Culture ordered.  CBC without leukocytosis or anemia.  CMP does show an elevated creatinine of 1.19, appears patient's baseline is around 0.7.  BUN of 23 no other electrolyte or LFT abnormality.  Patient has AKI,  likely some dehydration.  Will give fluids and recheck level.  Pregnancy test negative.  CT imaging shows  1. No acute abnormality in the abdomen or pelvis. 2. Tiny 1 mm nonobstructing left nephrolithiasis. No ureteral calculi or hydronephrosis. 3. Moderate colonic stool burden.   Patient has been given 2 L of IV fluids.  Recheck BMP shows creatinine has improved and is now at baseline at 0.74.  Previous urine cultures have grown out E. coli but have been resistant to cephalosporins.  Was sensitive to Augmentin .  Will prescribe the patient 7 days of Augmentin  for symptoms.  Her blood pressure is always on the softer side and this does appear similar to her previous.  Has improved some with fluids.  She has no leukocytosis and is well-appearing not concern for any sepsis.  Will discharge her home with urology follow-up given her multiple UTIs in the past.  Strict  return precautions.  We discussed the results of the labs/imaging. The plan is antibiotics, follow-up with urology. We discussed strict return precautions and red flag symptoms. The patient verbalized their understanding and agrees to the plan. The patient is stable and being discharged home in good condition.  Portions of this report may have been transcribed using voice recognition software. Every effort was made to ensure accuracy; however, inadvertent computerized transcription errors may be present.    Final Clinical Impression(s) / ED Diagnoses Final diagnoses:  Acute cystitis without hematuria  AKI (acute kidney injury) (HCC)    Rx / DC Orders ED Discharge Orders          Ordered    cefdinir (OMNICEF) 300 MG capsule  2 times daily,   Status:  Discontinued        05/15/24 2006    amoxicillin -clavulanate (AUGMENTIN ) 875-125 MG tablet  Every 12 hours        05/15/24 2009              Spence Dux, Kirby Peoples 05/15/24 2034    Almond Army, MD 05/16/24 2329

## 2024-05-15 NOTE — Discharge Instructions (Addendum)
 You were seen in the emerged from today for evaluation of your pain with urination.  You have a urinary tract infection.  I have included more information on this into the discharge paperwork.  It also showed that she was dehydrated and had an elevated kidney function.  We had given you fluids.  Please make sure that you are drinking fluids at home and staying well-hydrated.  This will need to be treated with an antibiotic.  I am going to prescribe you one that she will need to take daily for the next 7 days.  For pain you can take Tylenol  as needed or you can also take Pyridium  over-the-counter.  Please make sure you follow with your primary care doctor for reevaluation. You will need to follow-up with a urologist.  I have included information for one into the discharge paperwork.  Please make sure you call to schedule an appointment. If you have any concerns, new or worsening symptoms, please return to your nearest emergency department for reevaluation.   Please pick up ONLY the AUGMENTIN .   La atendieron hoy en urgencias para evaluar su dolor al [employer]. Tiene una infeccin del tracto urinario. Inclu ms informacin al respecto en el formulario de alta. Tambin se indic que estaba deshidratada y tena una funcin renal elevada. Le administramos lquidos. Por favor, asegrese de beber lquidos en casa y mantenerse bien hidratada. Esto deber tratarse con un antibitico. [place] recetar uno que deber tomar diariamente durante los prximos 7 das. Para el dolor, puede tomar Tylenol  segn sea necesario o tambin puede tomar Pyridium  sin receta. Asegrese de consultar con su mdico de cabecera para una reevaluacin. Deber realizar una cita de seguimiento con un urlogo. Inclu informacin sobre uno de ellos en el formulario de alta. Por favor, asegrese de llamar para programar una cita. Si tiene [place] inquietud, sntomas nuevos o que [place], regrese al servicio de urgencias ms cercano para una  reevaluacin.  Por favor, recoja SOLO AUGMENTIN .  Comunquese con un mdico si: Sus sntomas no han mejorado despus de 1 o 2 das de tratamiento con antibiticos. Los sntomas desaparecen y luego reaparecen. Tiene fiebre o escalofros. Vomita o tiene ganas de vomitar. Solicite ayuda de inmediato si: Tiene dolor muy intenso en la espalda o la parte baja del vientre. Se desmaya.

## 2024-05-15 NOTE — ED Triage Notes (Signed)
 Patient complains of lower back pain and bilateral flank pain x 1 week. States painful urination and dark urine. Body aches and chills. Has had UTIs in past and feels same way.

## 2024-05-16 LAB — URINE CULTURE: Culture: <10000 — AB

## 2024-05-17 DIAGNOSIS — R1030 Lower abdominal pain, unspecified: Secondary | ICD-10-CM | POA: Diagnosis not present

## 2024-05-17 DIAGNOSIS — N390 Urinary tract infection, site not specified: Secondary | ICD-10-CM | POA: Diagnosis not present

## 2024-05-18 ENCOUNTER — Ambulatory Visit: Payer: Self-pay

## 2024-05-18 NOTE — Telephone Encounter (Signed)
 FYI Only or Action Required?: FYI only for provider  Patient was last seen in primary care on 02/18/2024 by Marius Siemens, NP. Called Nurse Triage reporting Abdominal Pain. Symptoms began several weeks ago. Interventions attempted: Prescription medications: cedfinir and amoxicillin  and Rest, hydration, or home remedies. Symptoms are: gradually improving.  Triage Disposition: See Within 2 Weeks in [employer] understands and will follow disposition?: Pt rescheduled f/u appt for 6/24    Copied from CRM #[id number]. Topic: Clinical - Red Word Triage >> May 18, 2024  3:15 PM Donald Frost wrote: Red Word that prompted transfer to Nurse Triage: The patient called in stating she has had really bad abdominal pain and nausea for a week or 2 now. She has been seen at [facility] and [facility] where she was told she had a Uti and was prescribed Amox and another medication which she states has only helped a little. She has an appt with her provider next week but doesn't think she can make it as she has to work but is worried she is not better by now. I will transfer her to [facility] Additional Information  Commented on: All Negative - See PCP Within 2 Weeks    Seen and treated in the ED on 6/09 and 6/11 started on 2 abx, symptoms improving  Answer Assessment - Initial Assessment Questions 1. LOCATION: Where does it hurt?      Lower belly  2. RADIATION: Does the pain shoot anywhere else? (e.g., chest, back)     No  3. ONSET: When did the pain begin? (e.g., minutes, hours or days ago)      2 weeks  4. SUDDEN: Gradual or sudden onset?     Gradual  5. PATTERN Does the pain come and go, or is it constant?    - If it comes and goes: How long does it last? Do you have pain now?     (Note: Comes and goes means the pain is intermittent. It goes away completely between bouts.)    - If constant: Is it getting better, staying the same, or getting worse?      (Note: Constant means  the pain never goes away completely; most serious pain is constant and gets worse.)      Comes and goes  6. SEVERITY: How bad is the pain?  (e.g., Scale 1-10; mild, moderate, or severe)    - MILD (1-3): Doesn't interfere with normal activities, abdomen soft and not tender to touch.     - MODERATE (4-7): Interferes with normal activities or awakens from sleep, abdomen tender to touch.     - SEVERE (8-10): Excruciating pain, doubled over, unable to do any normal activities.       5/10 pain  7. RECURRENT SYMPTOM: Have you ever had this type of stomach pain before? If Yes, ask: When was the last time? and What happened that time?      Yes, its been bad before. But was dx for acute cystitis  8. CAUSE: What do you think is causing the stomach pain?     UTI  9. RELIEVING/AGGRAVATING FACTORS: What makes it better or worse? (e.g., antacids, bending or twisting motion, bowel movement)     No  10. OTHER SYMPTOMS: Do you have any other symptoms? (e.g., back pain, diarrhea, fever, urination pain, vomiting)       Burns when urinating  11. PREGNANCY: Is there any chance you are pregnant? When was your last menstrual period?  LMP-irregular   Seen and treated in ED on 6/9 and 6/11. Prescribed cedfinir and amoxicillian. Pt reporst that symptoms are improving. Pt says she was not able to urinate yesterday, but now she is able to. She does report some odor, but overall she feels better.  Pt called to have f/u rescheduled. Went ahead and rescheduled appt for 6/24  Protocols used: Abdominal Pain - [facility]

## 2024-05-19 NOTE — Telephone Encounter (Signed)
 Returned pt call and lvm asking her to give a call back so I can schedule her a sooner appt

## 2024-05-19 NOTE — Telephone Encounter (Signed)
 Reached out to pt again and was able to reach. Pt states she did have some abdominal pain yesterday but today it's a little better. Pt is also requesting a sooner appt. I have schedule pt an appt with provider for 6/17 at 330pm

## 2024-05-22 ENCOUNTER — Telehealth (INDEPENDENT_AMBULATORY_CARE_PROVIDER_SITE_OTHER): Payer: Self-pay | Admitting: Primary Care

## 2024-05-22 NOTE — Telephone Encounter (Signed)
 Spoke to pt about upcoming appt.. Will be present

## 2024-05-23 ENCOUNTER — Encounter (INDEPENDENT_AMBULATORY_CARE_PROVIDER_SITE_OTHER): Payer: Self-pay | Admitting: Primary Care

## 2024-05-23 ENCOUNTER — Ambulatory Visit (INDEPENDENT_AMBULATORY_CARE_PROVIDER_SITE_OTHER): Admitting: Primary Care

## 2024-05-23 ENCOUNTER — Other Ambulatory Visit (HOSPITAL_COMMUNITY)
Admission: RE | Admit: 2024-05-23 | Discharge: 2024-05-23 | Disposition: A | Discharge status: Home / Self Care | Source: Ambulatory Visit | Attending: Primary Care | Admitting: Primary Care

## 2024-05-23 VITALS — BP 101/64 | HR 60 | Resp 16 | Wt 124.4 lb

## 2024-05-23 DIAGNOSIS — E039 Hypothyroidism, unspecified: Secondary | ICD-10-CM

## 2024-05-23 DIAGNOSIS — R300 Dysuria: Secondary | ICD-10-CM | POA: Diagnosis not present

## 2024-05-23 DIAGNOSIS — N760 Acute vaginitis: Secondary | ICD-10-CM | POA: Insufficient documentation

## 2024-05-23 DIAGNOSIS — B9689 Other specified bacterial agents as the cause of diseases classified elsewhere: Secondary | ICD-10-CM | POA: Diagnosis not present

## 2024-05-23 NOTE — Progress Notes (Signed)
 [facility]  Maria Riley, is a 33 y.o. female  RDW:[id number]  FMW:[id number]  DOB - 1991-11-01  Chief Complaint  Patient presents with   Hypothyroidism   Dysuria    Burning urination        Subjective:   Maria Riley is a 33 y.o. female here today for an acute visit.  She voices having vaginal discharge malodorous thick.  Burning with urination.  Discussed female hygiene and she is doing it correctly.  She also has hypothyroidism-Hypothyroidism she does have, fatigue,, constipation, swelling, decreased memory/brain fog.  Will check thyroid  levels No problems updated.  Comprehensive ROS Pertinent positive and negative noted in HPI   No Known Allergies  Past Medical History:  Diagnosis Date   Anemia    Medical history non-contributory    Pulmonary embolism (HCC)     Current Outpatient Medications on File Prior to Visit  Medication Sig Dispense Refill   amoxicillin -clavulanate (AUGMENTIN ) 875-125 MG tablet Take 1 tablet by mouth every 12 (twelve) hours. 14 tablet 0   fluconazole  (DIFLUCAN ) 150 MG tablet Take 1 tablet (150 mg total) by mouth daily. 1 tablet 1   levothyroxine  (SYNTHROID ) 50 MCG tablet Take 1 tablet (50 mcg total) by mouth daily. 90 tablet 0   ondansetron  (ZOFRAN -ODT) 4 MG disintegrating tablet Take 1 tablet (4 mg total) by mouth every 8 (eight) hours as needed. 20 tablet 3   rizatriptan  (MAXALT ) 10 MG tablet Take 1 tablet (10 mg total) by mouth as needed for migraine. May repeat in 2 hours if needed 10 tablet 5   No current facility-administered medications on file prior to visit.   Health Maintenance  Topic Date Due   Hepatitis C Screening  Never done   HPV Vaccine (1 - 3-dose SCDM series) Never done   Pap with HPV screening  03/07/2023   COVID-19 Vaccine (1 - 2024-25 season) Never done   Flu Shot  07/07/2024   DTaP/Tdap/Td vaccine (4 - Td or Tdap) 04/26/2028   HIV Screening  Completed   Meningitis B Vaccine  Aged Out    Objective:    Vitals:   05/23/24 1547  BP: 101/64  Pulse: 60  Resp: 16  SpO2: 98%  Weight: 124 lb 6.4 oz (56.4 kg)   BP Readings from Last 3 Encounters:  05/23/24 101/64  05/15/24 98/69  04/13/24 92/61      Physical Exam Vitals reviewed.  Constitutional:      Appearance: Normal appearance.  HENT:     Head: Normocephalic.     Right Ear: External ear normal. There is no impacted cerumen.     Left Ear: External ear normal.     Nose: Nose normal.     Mouth/Throat:     Mouth: Mucous membranes are moist.   Eyes:     Extraocular Movements: Extraocular movements intact.     Pupils: Pupils are equal, round, and reactive to light.    Cardiovascular:     Rate and Rhythm: Normal rate and regular rhythm.  Pulmonary:     Effort: Pulmonary effort is normal.     Breath sounds: Normal breath sounds.  Abdominal:     General: Bowel sounds are normal.     Palpations: Abdomen is soft.   Musculoskeletal:        General: Normal range of motion.     Cervical back: Normal range of motion.   Skin:    General: Skin is warm and dry.   Neurological:     Mental  Status: She is alert and oriented to person, place, and time.   Psychiatric:        Mood and Affect: Mood normal.        Behavior: Behavior normal.        Thought Content: Thought content normal.     Assessment & Plan  Maria Riley was seen today for hypothyroidism and dysuria.  Diagnoses and all orders for this visit:  BV (bacterial vaginosis) -     Cervicovaginal ancillary only  Dysuria -     Urinalysis, Routine w reflex microscopic  Hypothyroidism, unspecified type -     TSH + free T4; Future  Other orders -     Microscopic Examination      Patient have been counseled extensively about nutrition and exercise. Other issues discussed during this visit include: low cholesterol diet, weight control and daily exercise, foot care, annual eye examinations at Ophthalmology, importance of adherence with medications and regular  follow-up. We also discussed long term complications of uncontrolled diabetes and hypertension.   No follow-ups on file.  The patient was given clear instructions to go to ER or return to medical center if symptoms don't improve, worsen or new problems develop. The patient verbalized understanding. The patient was told to call to get lab results if they haven't heard anything in the next week.   This note has been created with Education officer, environmental. Any transcriptional errors are unintentional.   Maria SHAUNNA Bohr, NP 05/29/2024, 2:25 AM

## 2024-05-24 LAB — MICROSCOPIC EXAMINATION
Bacteria, UA: NONE SEEN
Casts: NONE SEEN /LPF
RBC, Urine: NONE SEEN /HPF (ref 0–2)

## 2024-05-24 LAB — URINALYSIS, ROUTINE W REFLEX MICROSCOPIC
Bilirubin, UA: NEGATIVE
Glucose, UA: NEGATIVE
Ketones, UA: NEGATIVE
Nitrite, UA: NEGATIVE
Protein,UA: NEGATIVE
RBC, UA: NEGATIVE
Specific Gravity, UA: 1.01 (ref 1.005–1.030)
Urobilinogen, Ur: 0.2 mg/dL (ref 0.2–1.0)
pH, UA: 7.5 (ref 5.0–7.5)

## 2024-05-25 LAB — CERVICOVAGINAL ANCILLARY ONLY
Bacterial Vaginitis (gardnerella): POSITIVE — AB
Candida Glabrata: NEGATIVE
Candida Vaginitis: NEGATIVE
Chlamydia: NEGATIVE
Comment: NEGATIVE
Comment: NEGATIVE
Comment: NEGATIVE
Comment: NEGATIVE
Comment: NEGATIVE
Comment: NORMAL
Neisseria Gonorrhea: NEGATIVE
Trichomonas: NEGATIVE

## 2024-05-29 ENCOUNTER — Other Ambulatory Visit (INDEPENDENT_AMBULATORY_CARE_PROVIDER_SITE_OTHER): Payer: Self-pay | Admitting: Primary Care

## 2024-05-29 ENCOUNTER — Ambulatory Visit (INDEPENDENT_AMBULATORY_CARE_PROVIDER_SITE_OTHER): Payer: Self-pay | Admitting: Primary Care

## 2024-05-29 DIAGNOSIS — B9689 Other specified bacterial agents as the cause of diseases classified elsewhere: Secondary | ICD-10-CM

## 2024-05-29 MED ORDER — METRONIDAZOLE 500 MG PO TABS: 500.0000 mg | ORAL_TABLET | Freq: Two times a day (BID) | ORAL | 0 refills | Status: DC

## 2024-05-30 ENCOUNTER — Telehealth (INDEPENDENT_AMBULATORY_CARE_PROVIDER_SITE_OTHER): Payer: Self-pay | Admitting: Primary Care

## 2024-05-30 ENCOUNTER — Other Ambulatory Visit (INDEPENDENT_AMBULATORY_CARE_PROVIDER_SITE_OTHER): Payer: Self-pay

## 2024-05-30 ENCOUNTER — Ambulatory Visit (INDEPENDENT_AMBULATORY_CARE_PROVIDER_SITE_OTHER): Admitting: Primary Care

## 2024-05-30 DIAGNOSIS — B9689 Other specified bacterial agents as the cause of diseases classified elsewhere: Secondary | ICD-10-CM

## 2024-05-30 MED ORDER — FLUCONAZOLE 150 MG PO TABS
150.0000 mg | ORAL_TABLET | Freq: Every day | ORAL | 1 refills | Status: DC
Start: 2024-05-30 — End: 2024-10-01

## 2024-05-30 NOTE — Telephone Encounter (Signed)
 E2C2 agent called back with pt on the phone. I told agent that CMA was in a room, caring for pts. I will have her follow up with pt

## 2024-05-31 NOTE — Telephone Encounter (Signed)
 Returned pt call to go over lab results pt didn't answer and was unable to lvm

## 2024-06-06 ENCOUNTER — Encounter (INDEPENDENT_AMBULATORY_CARE_PROVIDER_SITE_OTHER): Payer: Self-pay

## 2024-07-10 ENCOUNTER — Telehealth: Payer: Self-pay | Admitting: *Deleted

## 2024-07-10 NOTE — Telephone Encounter (Signed)
 Copied from CRM [id number]. Topic: Appointments - Appointment Scheduling >> Jul 07, 2024  2:12 PM Sophia H wrote:  **Spanish speaker Patient states hurt her right foot, now has bruising and looks like it is knotted up around the wound. States does have some pain and wants to schedule an appointment. Soonest in clinic is too far out for patient, wanting to know if she can be worked in or if there is any other advice that can be given. # [phone]

## 2024-08-01 ENCOUNTER — Emergency Department (HOSPITAL_COMMUNITY): Admission: EM | Admit: 2024-08-01 | Discharge: 2024-08-01 | Disposition: A | Discharge status: Home / Self Care

## 2024-08-01 ENCOUNTER — Other Ambulatory Visit: Payer: Self-pay

## 2024-08-01 ENCOUNTER — Ambulatory Visit: Payer: Self-pay

## 2024-08-01 ENCOUNTER — Encounter (HOSPITAL_COMMUNITY): Payer: Self-pay | Admitting: Emergency Medicine

## 2024-08-01 DIAGNOSIS — R300 Dysuria: Secondary | ICD-10-CM | POA: Diagnosis present

## 2024-08-01 DIAGNOSIS — N390 Urinary tract infection, site not specified: Secondary | ICD-10-CM

## 2024-08-01 DIAGNOSIS — N3001 Acute cystitis with hematuria: Secondary | ICD-10-CM | POA: Diagnosis not present

## 2024-08-01 LAB — URINALYSIS, W/ REFLEX TO CULTURE (INFECTION SUSPECTED)
Bilirubin Urine: NEGATIVE
Glucose, UA: 250 mg/dL — AB
Nitrite: POSITIVE — AB
Protein, ur: 300 mg/dL — AB
RBC / HPF: >50 RBC/hpf (ref 0–5)
Specific Gravity, Urine: 1.015 (ref 1.005–1.030)
WBC, UA: >50 WBC/hpf (ref 0–5)
pH: 7 (ref 5.0–8.0)

## 2024-08-01 LAB — PREGNANCY, URINE: Preg Test, Ur: NEGATIVE

## 2024-08-01 MED ORDER — ONDANSETRON 4 MG PO TBDP
4.0000 mg | ORAL_TABLET | Freq: Three times a day (TID) | ORAL | 0 refills | Status: DC | PRN
Start: 2024-08-01 — End: 2024-10-01

## 2024-08-01 MED ORDER — NITROFURANTOIN MONOHYD MACRO 100 MG PO CAPS: 100.0000 mg | ORAL_CAPSULE | Freq: Two times a day (BID) | ORAL | 0 refills | Status: DC

## 2024-08-01 NOTE — Discharge Instructions (Addendum)
 Take antibiotics as prescribed.  Please take all of your antibiotics until finished!   You may develop abdominal discomfort or diarrhea from the antibiotic.  You may help offset this with probiotics which you can buy or get in yogurt. Do not eat  or take the probiotics until 2 hours after your antibiotic.

## 2024-08-01 NOTE — Telephone Encounter (Signed)
 FYI Only or Action Required?: FYI only for provider.  Patient was last seen in primary care on 05/23/2024 by Celestia Rosaline SQUIBB, NP.  Called Nurse Triage reporting Urinary Frequency. Pt states that very little urine is coming out. Abdomen is uncomfortable, and larger than normal. Pt feels warm. Reports some back pain.   Symptoms began today over night.  Interventions attempted: Nothing.  Symptoms are: gradually worsening.  Triage Disposition: Go to ED Now (Notify PCP)  Patient/caregiver understands and will follow disposition?: Yes                      Copied from CRM [id number]. Topic: Clinical - Red Word Triage >> Aug 01, 2024  8:03 AM Maria Riley wrote: [facility] that prompted transfer to Nurse Triage: constantly going to bathroom ( urgency ) .. nothing coming .. hurts Reason for Disposition  [1] Unable to urinate (or only a few drops) > 4 hours AND [2] bladder feels very full (e.Riley., palpable bladder or strong urge to urinate)  Answer Assessment - Initial Assessment Questions 1. SYMPTOM: What's the main symptom you're concerned about? (e.Riley., frequency, incontinence)     not 2. ONSET: When did the  s/s  start?     This morning between 3-4 this morning 3. PAIN: Is there any pain? If Yes, ask: How bad is it? (Scale: 1-10; mild, moderate, severe)     after  5. OTHER SYMPTOMS: Do you have any other symptoms? (e.Riley., blood in urine, fever, flank pain, pain with urination)     Pain in back, feels warm  Protocols used: Urinary Symptoms-A-AH

## 2024-08-01 NOTE — ED Triage Notes (Signed)
 33 y/o female comes c/o painful urination associated with frequency and urgency that started at 3am this morning. Pt reports 1 episode of vomiting, but denies any nausea, fevers or chills. Hx of UTI in the past

## 2024-08-01 NOTE — ED Provider Notes (Signed)
 [place] EMERGENCY DEPARTMENT AT [facility] Provider Note   CSN: [id number] Arrival date & time: 08/01/24  9153     Patient presents with: Dysuria   Maria Riley is a 33 y.o. female.    Dysuria  Spanish-language interpreter was used for the entirety of visit  Patient is presenting emergency room today with complaints of frequency urgency dysuria since last night/this morning.  She states that she felt like she had a urinary tract infection.  Has had numerous UTIs in the past has seen urology and has also seen her OB/GYN and has been given reassurance she says that she is had 6 UTIs this year.  She has no history of urosepsis.  Denies any vaginal discharge or history of gonorrhea/chlamydia.  She denies any medication allergies.  She has had a hysterectomy.      Prior to Admission medications   Medication Sig Start Date End Date Taking? Authorizing Provider  nitrofurantoin , macrocrystal-monohydrate, (MACROBID ) 100 MG capsule Take 1 capsule (100 mg total) by mouth 2 (two) times daily. 08/01/24  Yes Princella Jaskiewicz S, PA  ondansetron  (ZOFRAN -ODT) 4 MG disintegrating tablet Take 1 tablet (4 mg total) by mouth every 8 (eight) hours as needed for nausea or vomiting. 08/01/24  Yes Rashied Corallo S, PA  fluconazole  (DIFLUCAN ) 150 MG tablet Take 1 tablet (150 mg total) by mouth daily. 05/30/24   Celestia Rosaline SQUIBB, NP  levothyroxine  (SYNTHROID ) 50 MCG tablet Take 1 tablet (50 mcg total) by mouth daily. 05/03/24   Celestia Rosaline SQUIBB, NP  rizatriptan  (MAXALT ) 10 MG tablet Take 1 tablet (10 mg total) by mouth as needed for migraine. May repeat in 2 hours if needed 04/13/24   Leigh Venetia CROME, MD    Allergies: Patient has no known allergies.    Review of Systems  Genitourinary:  Positive for dysuria.    Updated Vital Signs BP 112/65 (BP Location: Right Arm)   Pulse 76   Temp 98 F (36.7 C) (Oral)   Resp 16   Ht 4' 11 (1.499 m)   Wt 54.4 kg   LMP 06/08/2021 (Approximate)    SpO2 100%   BMI 24.24 kg/m   Physical Exam Vitals and nursing note reviewed.  Constitutional:      General: She is not in acute distress. HENT:     Head: Normocephalic and atraumatic.     Nose: Nose normal.     Mouth/Throat:     Mouth: Mucous membranes are moist.  Eyes:     General: No scleral icterus. Cardiovascular:     Rate and Rhythm: Normal rate and regular rhythm.     Pulses: Normal pulses.     Heart sounds: Normal heart sounds.  Pulmonary:     Effort: Pulmonary effort is normal.  Abdominal:     Palpations: Abdomen is soft.     Tenderness: There is no abdominal tenderness. There is no guarding or rebound.  Musculoskeletal:     Cervical back: Normal range of motion.     Right lower leg: No edema.     Left lower leg: No edema.  Skin:    General: Skin is warm and dry.     Capillary Refill: Capillary refill takes less than 2 seconds.  Neurological:     Mental Status: She is alert. Mental status is at baseline.  Psychiatric:        Mood and Affect: Mood normal.        Behavior: Behavior normal.     (  all labs ordered are listed, but only abnormal results are displayed) Labs Reviewed  URINALYSIS, W/ REFLEX TO CULTURE (INFECTION SUSPECTED) - Abnormal; Notable for the following components:      Result Value   Color, Urine ORANGE (*)    APPearance HAZY (*)    Glucose, UA 250 (*)    Hgb urine dipstick SMEAR ONLY (*)    Ketones, ur TRACE (*)    Protein, ur 300 (*)    Nitrite POSITIVE (*)    Leukocytes,Ua MODERATE (*)    Bacteria, UA RARE (*)    All other components within normal limits  URINE CULTURE  PREGNANCY, URINE  GC/CHLAMYDIA PROBE AMP ([place]) NOT AT [facility]    EKG: None  Radiology: No results found.   Procedures   Medications Ordered in the ED - No data to display                                  Medical Decision Making Amount and/or Complexity of Data Reviewed Labs: ordered.  Risk Prescription drug management.   Spanish-language  interpreter was used for the entirety of visit  Patient is presenting emergency room today with complaints of frequency urgency dysuria since last night/this morning.  She states that she felt like she had a urinary tract infection.  Has had numerous UTIs in the past has seen urology and has also seen her OB/GYN and has been given reassurance she says that she is had 6 UTIs this year.  She has no history of urosepsis.  Denies any vaginal discharge or history of gonorrhea/chlamydia.  She denies any medication allergies.  She has had a hysterectomy.  Patient with multiple UTIs over the past year will add on urine culture and gonorrhea chlamydia due to concern for urethritis.  Urine pregnancy negative, urinalysis with greater than 50 WBCs, positive for leukocytes and nitrites.  Patient prescribed Macrobid , Zofran  for nausea and will follow-up with her urologist.  Return precautions discussed.  Patient does not have any symptoms of pyelonephritis specifically no flank pain, fever, systemic illness such as myalgias malaise and fatigue.  Also no nausea or vomiting.  Patient is well-appearing has normal vital signs will discharge home at this time with return precautions to the emergency room.   Final diagnoses:  Lower urinary tract infectious disease  Acute cystitis with hematuria    ED Discharge Orders          Ordered    nitrofurantoin , macrocrystal-monohydrate, (MACROBID ) 100 MG capsule  2 times daily        08/01/24 1008    ondansetron  (ZOFRAN -ODT) 4 MG disintegrating tablet  Every 8 hours PRN        08/01/24 1033               Neldon Hamp RAMAN, [place] 08/01/24 1441    Neysa Caron PARAS, DO 08/01/24 1516

## 2024-08-02 LAB — URINE CULTURE: Culture: NO GROWTH

## 2024-08-02 LAB — GC/CHLAMYDIA PROBE AMP (~~LOC~~) NOT AT ARMC
Chlamydia: NEGATIVE
Comment: NEGATIVE
Comment: NORMAL
Neisseria Gonorrhea: NEGATIVE

## 2024-08-02 NOTE — Progress Notes (Deleted)
 NEUROLOGY FOLLOW UP OFFICE NOTE  [facility] [id number]  Subjective:  Maria Riley is a 33 y.o. year old right-handed female with a medical history of hypothyroidism who we last saw on 04/13/24 for headaches.  To briefly review: 04/13/24: Patient has not had any recent headaches. She went to ED on 01/30/24. She had a headache for 4 days at that time. Tylenol  was not helping. Pain was around 7/10. She then saw her PCP on 02/18/24. She has not had a headache since then. The pain can be on either the right or left side. It is a throbbing pain. She first had headaches about 3 years ago. Headaches can last 3 days. She typically has about 1 headache per week. She endorses photophobia, phonophobia, nausea, and vomiting. She has imbalance but no vision loss. She prefers to be in cold dark room when she has a headache.   There is mention of PCP giving sample of sumatriptan  but patient does not remember this. She does not think she has been given HA medications.   Smoker: No OCP use: No, cannot have children anymore; had multiple surgeries (?hysterectomy) Caffiene use: occasional sweat tea EtOH use: None Restrictive diet: No Family history of neurologic disease, including headaches: No  Most recent Assessment and Plan (04/13/24): Maria Riley is a 33 y.o. female who presents for evaluation of headaches. She has a relevant medical history of hypothryoidism. Her neurological examination is essentially normal today. Available diagnostic data is significant for low TSH on 02/18/24 but normal on 03/30/24. 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 Her hypothyroidism may have been contributing as well. She has up to 4-5 headaches per month but had no headaches in the last 1.5 months. I will start with rescue medication but may start a preventative if headaches increase.    PLAN: -Blood work: B12 -For migraines: Migraine prevention: Will defer for now due to infrequency of  headaches currently Migraine rescue:  Maxalt  10 mg as needed at headache onset, can repeat in 2 hours if needed. Zofran  4 mg under the tongue as needed for nausea Limit use of pain relievers to no more than 2 days out of week to prevent risk of rebound or medication-overuse headache. Keep headache diary  Since their last visit: ***Going to ED a lot with UTI  ***  MEDICATIONS:  Outpatient Encounter Medications as of 08/10/2024  Medication Sig   fluconazole  (DIFLUCAN ) 150 MG tablet Take 1 tablet (150 mg total) by mouth daily.   levothyroxine  (SYNTHROID ) 50 MCG tablet Take 1 tablet (50 mcg total) by mouth daily.   nitrofurantoin , macrocrystal-monohydrate, (MACROBID ) 100 MG capsule Take 1 capsule (100 mg total) by mouth 2 (two) times daily.   ondansetron  (ZOFRAN -ODT) 4 MG disintegrating tablet Take 1 tablet (4 mg total) by mouth every 8 (eight) hours as needed for nausea or vomiting.   rizatriptan  (MAXALT ) 10 MG tablet Take 1 tablet (10 mg total) by mouth as needed for migraine. May repeat in 2 hours if needed   No facility-administered encounter medications on file as of 08/10/2024.    PAST MEDICAL HISTORY: Past Medical History:  Diagnosis Date   Anemia    Medical history non-contributory    Pulmonary embolism (HCC)     PAST SURGICAL HISTORY: Past Surgical History:  Procedure Laterality Date   ABDOMINAL HYSTERECTOMY     CESAREAN SECTION N/A 06/13/2018   Procedure: CESAREAN SECTION;  Surgeon: Izell Harari, MD;  [facility];  Service: Obstetrics;  Laterality: N/A;   IR CATHETER TUBE CHANGE  02/05/2022   IR IMAGE GUIDED DRAINAGE BY PERCUTANEOUS CATHETER  02/18/2022   IR RADIOLOGIST EVAL & MGMT  02/04/2022   IR RADIOLOGIST EVAL & MGMT  02/12/2022   IR RADIOLOGIST EVAL & MGMT  03/11/2022   LYSIS OF ADHESION N/A 08/19/2022   Procedure: LYSIS OF ADHESIONS;  Surgeon: Sheldon Standing, MD;  Location: [facility] ORS;  Service: General;  Laterality: N/A;   WISDOM TOOTH EXTRACTION  Bilateral    XI ROBOTIC ASSISTED COLOSTOMY TAKEDOWN N/A 08/19/2022   Procedure: XI ROBOTIC ASSISTED END ILEOSTOMY TAKEDOWN;  Surgeon: Sheldon Standing, MD;  Location: [facility] ORS;  Service: General;  Laterality: N/A;    ALLERGIES: No Known Allergies  FAMILY HISTORY: Family History  Problem Relation Age of Onset   Healthy Father    Alcohol abuse Neg Hx    Arthritis Neg Hx    Asthma Neg Hx    Birth defects Neg Hx    Cancer Neg Hx    COPD Neg Hx    Depression Neg Hx    Diabetes Neg Hx    Drug abuse Neg Hx    Early death Neg Hx    Hearing loss Neg Hx    Heart disease Neg Hx    Hyperlipidemia Neg Hx    Hypertension Neg Hx    Kidney disease Neg Hx    Learning disabilities Neg Hx    Mental illness Neg Hx    Mental retardation Neg Hx    Miscarriages / Stillbirths Neg Hx    Stroke Neg Hx    Vision loss Neg Hx    Varicose Veins Neg Hx     SOCIAL HISTORY: Social History   Tobacco Use   Smoking status: Never   Smokeless tobacco: Never  Vaping Use   Vaping status: Never Used  Substance Use Topics   Alcohol use: No   Drug use: No   Social History   Social History Narrative   Are you right handed or left handed? Right   Are you currently employed ? no   What is your current occupation?    Do you live at home alone?   Who lives with you? kids   What type of home do you live in: 1 story or 2 story? one    Caffeine  none      Objective:  Vital Signs:  LMP 06/08/2021 (Approximate)   ***  Labs and Imaging review: New results: B12 (04/13/24): 543  External labs (05/17/24): CBC unremarkable CMP unremarkable Beta hCG negative  Previously reviewed results: TSH (02/18/24): low at 0.396; normal on 03/30/24 CBC w/ diff (01/15/23): unremarkable BMP (08/22/22): Ca low at 7.9  Assessment/Plan:  This is Maria Riley, a 33 y.o. female with: ***   Plan: ***  Return to clinic in ***  Total time spent reviewing records, interview, history/exam, documentation, and coordination  of care on day of encounter:  *** min  Venetia Potters, MD

## 2024-08-10 ENCOUNTER — Encounter: Payer: Self-pay | Admitting: Neurology

## 2024-08-10 ENCOUNTER — Ambulatory Visit: Admitting: Neurology

## 2024-08-23 ENCOUNTER — Other Ambulatory Visit (INDEPENDENT_AMBULATORY_CARE_PROVIDER_SITE_OTHER): Payer: Self-pay | Admitting: *Deleted

## 2024-08-23 DIAGNOSIS — E039 Hypothyroidism, unspecified: Secondary | ICD-10-CM

## 2024-08-23 MED ORDER — LEVOTHYROXINE SODIUM 50 MCG PO TABS: 50.0000 ug | ORAL_TABLET | Freq: Every day | ORAL | 0 refills | Status: DC

## 2024-08-25 ENCOUNTER — Encounter (INDEPENDENT_AMBULATORY_CARE_PROVIDER_SITE_OTHER): Payer: Self-pay

## 2024-08-25 ENCOUNTER — Telehealth (INDEPENDENT_AMBULATORY_CARE_PROVIDER_SITE_OTHER): Payer: Self-pay | Admitting: Primary Care

## 2024-08-25 NOTE — Telephone Encounter (Signed)
 Contacted pt 1st attempt left vm to resch appt pcp not in the office 9/24

## 2024-08-29 ENCOUNTER — Telehealth: Payer: Self-pay | Admitting: Primary Care

## 2024-08-29 NOTE — Telephone Encounter (Signed)
 2nd attempt contacted pt left vm Due to a change in our office schedule, your appointment must be changed It is very important that we reach you to reschedule this appointment on 08/30/2024

## 2024-08-30 ENCOUNTER — Encounter (INDEPENDENT_AMBULATORY_CARE_PROVIDER_SITE_OTHER): Payer: Self-pay

## 2024-08-30 ENCOUNTER — Ambulatory Visit (INDEPENDENT_AMBULATORY_CARE_PROVIDER_SITE_OTHER): Admitting: Primary Care

## 2024-09-05 ENCOUNTER — Telehealth: Payer: Self-pay

## 2024-09-05 DIAGNOSIS — E039 Hypothyroidism, unspecified: Secondary | ICD-10-CM

## 2024-09-05 NOTE — Progress Notes (Unsigned)
 Complex Care Management Note Care Guide Note  09/05/2024 Name: Maria Riley MRN: [id number] DOB: [birth year]   Complex Care Management Outreach Attempts: An unsuccessful telephone outreach was attempted today to offer the patient information about available complex care management services.  Follow Up Plan:  Additional outreach attempts will be made to offer the patient complex care management information and services.   Encounter Outcome:  No Answer  Dreama [facility]  [facility], [facility] VBCI Assistant Direct Dial: [phone]  Fax: [phone]

## 2024-09-06 NOTE — Progress Notes (Unsigned)
 Complex Care Management Note Care Guide Note  09/06/2024 Name: Maria Riley MRN: [id number] DOB: 10/12/1991   Complex Care Management Outreach Attempts: A second unsuccessful outreach was attempted today to offer the patient with information about available complex care management services.  Follow Up Plan:  Additional outreach attempts will be made to offer the patient complex care management information and services.   Encounter Outcome:  No Answer  Dreama [facility]  [facility], [facility] VBCI Assistant Direct Dial: [phone]  Fax: [phone]

## 2024-09-08 NOTE — Progress Notes (Signed)
 Complex Care Management Note Care Guide Note  09/08/2024 Name: Maria Riley MRN: [id number] DOB: 1991/01/27   Complex Care Management Outreach Attempts: A third unsuccessful outreach was attempted today to offer the patient with information about available complex care management services.  Follow Up Plan:  No further outreach attempts will be made at this time. We have been unable to contact the patient to offer or enroll patient in complex care management services.  Encounter Outcome:  No Answer  Dreama [facility]  [facility], [facility] VBCI Assistant Direct Dial: [phone]  Fax: [phone]

## 2024-09-21 ENCOUNTER — Ambulatory Visit (INDEPENDENT_AMBULATORY_CARE_PROVIDER_SITE_OTHER): Admitting: Primary Care

## 2024-09-21 ENCOUNTER — Encounter (INDEPENDENT_AMBULATORY_CARE_PROVIDER_SITE_OTHER): Payer: Self-pay | Admitting: Primary Care

## 2024-09-21 VITALS — BP 96/65 | HR 65 | Ht 59.0 in | Wt 121.0 lb

## 2024-09-21 DIAGNOSIS — B009 Herpesviral infection, unspecified: Secondary | ICD-10-CM

## 2024-09-21 DIAGNOSIS — E039 Hypothyroidism, unspecified: Secondary | ICD-10-CM

## 2024-09-21 NOTE — Patient Instructions (Signed)
 Herpes labial [place]ore  El herpes labial, tambin llamado boquera, es una pequea ampolla llena de lquido que se forma dentro de la boca o en los labios, las encas, la nariz, el mentn o las [place]. El herpes labial puede diseminarse a otras partes del cuerpo, como los ojos, los dedos o los genitales. Se puede transmitir de una persona a otra (es contagioso) hasta que las ampollas se cicatrizan por completo. La mayora de los herpes labiales desaparecen en el trmino de 2 semanas. Cules son las causas? El herpes labial es causado por un virus (virus del herpes simple tipo 1, VHS-1). El virus se puede transmitir de ignacia persona a otra a travs del contacto cercano, como por ejemplo: Besarse. Tocar la zona afectada. Compartir elementos personales, como protector labial, afeitadoras, vasos o cubiertos. Qu incrementa el riesgo? Estar cansado, estresado o enfermo. Tener el perodo (estar menstruando). Estar embarazada. Tomar ciertos medicamentos. Estar al doss rasher en clima fro o pasar mucho tiempo al sol. Cules son los signos o los sntomas? Los sntomas del herpes labial pasan por diferentes etapas: Uno o [facility] antes de la aparicin del herpes labial, podr sentir pinchazos, picazn o ardor. Aparecen ampollas llenas de lquido en los labios, dentro de la boca, en la nariz o en las mejillas. Las ampollas comienzan a supurar un lquido claro. Las ampollas se secan y aparece una costra amarillenta en su lugar. La costra se cae. En algunos casos, pueden presentarse otros sntomas junto con el herpes labial. Estos pueden incluir los siguientes: [place]. Dolor de Advertising copywriter. Dolor de cabeza. Dolores musculares. Ganglios del cuello inflamados. Cmo se trata? No hay cura para el herpes labial ni para el virus que lo causa. Tampoco hay una vacuna para prevenir el virus. La mayora de los herpes labiales desaparecen sin tratamiento en el trmino de 2 semanas. El mdico puede recetarle  medicamentos para: Ayudar a Engineer, materials. Evitar que el virus crezca. Ayudar a que la cicatrizacin sea ms rpida. Los medicamentos pueden presentarse en forma de crema, gel, pldoras o inyeccin. Siga estas instrucciones en su casa: Medicamentos [place] o aplique los medicamentos de venta libre y los recetados solamente como se lo haya indicado el mdico. Use un hisopo de algodn para aplicar crema o gel en las ampollas. Pregntele al mdico si puede tomar suplementos de lisina. Estos pueden ayudar con la cicatrizacin. Cuidado de las ampollas  No se toque las ampollas ni retire las Architectural technologist. Lvese las manos frecuentemente con agua y jabn durante al menos 20 segundos. No se toque los ojos sin antes lavarse las manos. Mantenga las ampollas limpias y secas. Si se lo indican, aplique hielo [employer]. Para hacer esto: Ponga el hielo en una bolsa plstica. Coloque una toalla entre la piel y la bolsa. Aplique el hielo durante 20 minutos, 2 a 3 veces por da. Retire el hielo si la piel se le pone de color rojo brillante. Esto es [employer]. Si no puede sentir dolor, calor o fro, tiene un mayor riesgo de que se dae la zona. Comida y bebida Consuma una dieta liviana y blanda. Evite consumir alimentos calientes, fros o salados. Estos pueden lastimarle la boca. Use un sorbete si beber lquidos de un vaso le causa dolor. Consuma alimentos con alto contenido de lisina. Estos incluyen carnes rojas, pescados y productos lcteos. Evite los alimentos azucarados, los chocolates, los frutos secos y los cereales. Estos alimentos tienen alto contenido de una sustancia (arginina) que puede hacer que el  virus crezca. Estilo de vida No bese, no mantenga sexo oral ni comparta artculos de uso personal hasta que las ampollas se Midwife. El estrs, el no dormir lo suficiente y Web designer afuera al sol pueden desencadenar el herpes labial. Asegrese de hacer lo siguiente: Realice actividades que  lo ayuden a Lexicographer, como ejercicios de respiracin profunda o meditacin. Duerma lo suficiente. Pngase pantalla solar en los labios antes de salir al sol. Comunquese con un mdico si: Tiene sntomas durante ms de 2 semanas. Le sale pus de las ampollas. El enrojecimiento se expande. Siente dolor o irritacin en el ojo. Tiene ampollas en los genitales. Las ampollas no se curan en el trmino de 2 semanas. Le aparece herpes labial con frecuencia. Solicite ayuda de inmediato si: Tiene fiebre y los sntomas empeoran repentinamente. Tiene dolor de cabeza y confusin. Tiene cansancio (fatiga). No desea comer en las cantidades normales (prdida del apetito). Tiene rigidez en el cuello o sensibilidad a la luz. Resumen El herpes labial es una pequea ampolla llena de lquido que se forma dentro de la boca o en los labios, las encas, la nariz, el mentn o las [place]. Se puede transmitir de una persona a otra (es contagioso) hasta que las ampollas se cicatrizan por completo. La mayora de los herpes labiales desaparecen en el trmino de 2 semanas. Lvese las manos con frecuencia. No se toque los ojos sin antes lavarse las manos. No bese, no mantenga sexo oral ni comparta artculos de uso personal hasta que las ampollas se cicatricen. Comunquese con un mdico si las ampollas no cicatrizan en el trmino de 2 semanas. Esta informacin no tiene Theme park manager el consejo del mdico. Asegrese de hacerle al mdico cualquier pregunta que tenga. Document Revised: 10/02/2021 Document Reviewed: 10/02/2021 Elsevier Patient Education  2024 [employer].

## 2024-09-22 LAB — TSH+FREE T4
Free T4: 0.82 ng/dL (ref 0.82–1.77)
TSH: 0.916 u[IU]/mL (ref 0.450–4.500)

## 2024-10-01 ENCOUNTER — Ambulatory Visit (HOSPITAL_COMMUNITY)
Admission: EM | Admit: 2024-10-01 | Discharge: 2024-10-01 | Disposition: A | Discharge status: Home / Self Care | Attending: Emergency Medicine | Admitting: Emergency Medicine

## 2024-10-01 ENCOUNTER — Encounter (HOSPITAL_COMMUNITY): Payer: Self-pay

## 2024-10-01 DIAGNOSIS — Z20822 Contact with and (suspected) exposure to covid-19: Secondary | ICD-10-CM | POA: Diagnosis not present

## 2024-10-01 LAB — POC COVID19/FLU A&B COMBO
Covid Antigen, POC: NEGATIVE
Influenza A Antigen, POC: NEGATIVE
Influenza B Antigen, POC: NEGATIVE

## 2024-10-01 NOTE — ED Triage Notes (Signed)
 Patient here today to be tested for Covid. Patient states that she was exposed to Covid at school. Patient took a home test and it was negative but her instructor is requesting a negative result at the doctors office. Denies symptoms.

## 2024-10-01 NOTE — ED Provider Notes (Signed)
 [facility]    CSN: [id number] Arrival date & time: 10/01/24  1103      History   Chief Complaint Chief Complaint  Patient presents with   Exposed to Covid    HPI Maria Riley is a 33 y.o. female.   Patient presents requesting COVID testing.  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  Mother denies any cough, congestion, sore throat, headache, chest pain, shortness of breath, vomiting, diarrhea, and abdominal pain.  The history is provided by the patient and medical records. The history is limited by a language barrier. A language interpreter was used (Spanish interpreter).    Past Medical History:  Diagnosis Date   Anemia    Medical history non-contributory    Pulmonary embolism [facility])     Patient Active Problem List   Diagnosis Date Noted   History of closure of ileostomy 08/19/2022   History of pulmonary embolism 05/22/2022   Hypokalemia 02/19/2022   Sepsis (HCC) 02/18/2022   Hyponatremia 02/18/2022   History of DVT (deep vein thrombosis) 02/18/2022   Abdominal wall abscess 02/17/2022   Ileostomy in place [facility]) 01/26/2022   Stillborn, abnormal 01/26/2022   UTI (urinary tract infection) 01/23/2022   Pelvic abscess in female 01/22/2022   Acute deep vein thrombosis (DVT) of iliac vein of left lower extremity (HCC) 01/22/2022   Acute deep vein thrombosis (DVT) of femoral vein of right lower extremity (HCC) 01/22/2022   SAB (spontaneous abortion) 04/29/2021   Overdose 12/19/2020   MDD (major depressive disorder), recurrent episode, severe (HCC) 07/08/2018   Antepartum placental abruption 06/13/2018   PPH (postpartum hemorrhage) 06/13/2018   Non-English speaking patient (Spanish) 06/13/2018   Supervision of high risk pregnancy, antepartum 11/23/2016   History of preterm delivery 11/23/2016    Past Surgical History:  Procedure  Laterality Date   ABDOMINAL HYSTERECTOMY     CESAREAN SECTION N/A 06/13/2018   Procedure: CESAREAN SECTION;  Surgeon: Izell Harari, MD;  Location: [facility] BIRTHING SUIT[facility];  Service: Obstetrics;  Laterality: N/A;   IR CATHETER TUBE CHANGE  02/05/2022   IR IMAGE GUIDED DRAINAGE BY PERCUTANEOUS CATHETER  02/18/2022   IR RADIOLOGIST EVAL & MGMT  02/04/2022   IR RADIOLOGIST EVAL & MGMT  02/12/2022   IR RADIOLOGIST EVAL & MGMT  03/11/2022   LYSIS OF ADHESION N/A 08/19/2022   Procedure: LYSIS OF ADHESIONS;  Surgeon: Sheldon Standing, MD;  Location: [facility] ORS;  Service: General;  Laterality: N/A;   WISDOM TOOTH EXTRACTION Bilateral    XI ROBOTIC ASSISTED COLOSTOMY TAKEDOWN N/A 08/19/2022   Procedure: XI ROBOTIC ASSISTED END ILEOSTOMY TAKEDOWN;  Surgeon: Sheldon Standing, MD;  Location: [facility] ORS;  Service: General;  Laterality: N/A;    OB History     Gravida  6   Para  4   Term  0   Preterm  4   AB  1   Living  3      SAB  1   IAB  0   Ectopic  0   Multiple  0   Live Births  4            Home Medications    Prior to Admission medications   Medication Sig Start Date End Date Taking? Authorizing Provider  levothyroxine  (SYNTHROID ) 50 MCG tablet Take 1 tablet (50 mcg total) by mouth daily. 08/23/24   Celestia Rosaline SQUIBB,  NP  rizatriptan  (MAXALT ) 10 MG tablet Take 1 tablet (10 mg total) by mouth as needed for migraine. May repeat in 2 hours if needed Patient not taking: Reported on 09/21/2024 04/13/24   Leigh Venetia CROME, MD    Family History Family History  Problem Relation Age of Onset   Healthy Father    Alcohol abuse Neg Hx    Arthritis Neg Hx    Asthma Neg Hx    Birth defects Neg Hx    Cancer Neg Hx    COPD Neg Hx    Depression Neg Hx    Diabetes Neg Hx    Drug abuse Neg Hx    Early death Neg Hx    Hearing loss Neg Hx    Heart disease Neg Hx    Hyperlipidemia Neg Hx    Hypertension Neg Hx    Kidney disease Neg Hx    Learning disabilities Neg Hx    Mental illness Neg  Hx    Mental retardation Neg Hx    Miscarriages / Stillbirths Neg Hx    Stroke Neg Hx    Vision loss Neg Hx    Varicose Veins Neg Hx     Social History Social History   Tobacco Use   Smoking status: Never   Smokeless tobacco: Never  Vaping Use   Vaping status: Never Used  Substance Use Topics   Alcohol use: No   Drug use: No     Allergies   Patient has no known allergies.   Review of Systems Review of Systems  Per HPI  Physical Exam Triage Vital Signs ED Triage Vitals  Encounter Vitals Group     BP      Girls Systolic BP Percentile      Girls Diastolic BP Percentile      Boys Systolic BP Percentile      Boys Diastolic BP Percentile      Pulse      Resp      Temp      Temp src      SpO2      Weight      Height      Head Circumference      Peak Flow      Pain Score      Pain Loc      Pain Education      Exclude from Growth Chart    No data found.  Updated Vital Signs BP 92/68 (BP Location: Left Arm)   Pulse 74   Temp 97.7 F (36.5 C) (Oral)   Resp 16   LMP 06/08/2021 (Approximate)   SpO2 98%   Visual Acuity Right Eye Distance:   Left Eye Distance:   Bilateral Distance:    Right Eye Near:   Left Eye Near:    Bilateral Near:     Physical Exam Vitals and nursing note reviewed.  Constitutional:      General: She is awake. She is not in acute distress.    Appearance: Normal appearance. She is well-developed and well-groomed. She is not ill-appearing.  HENT:     Nose: Nose normal.     Mouth/Throat:     Mouth: Mucous membranes are moist.     Pharynx: Oropharynx is clear.  Cardiovascular:     Rate and Rhythm: Normal rate and regular rhythm.  Pulmonary:     Effort: Pulmonary effort is normal.     Breath sounds: Normal breath sounds.  Skin:    General: Skin  is warm and dry.  Neurological:     Mental Status: She is alert.  Psychiatric:        Behavior: Behavior is cooperative.      UC Treatments / Results  Labs (all labs ordered  are listed, but only abnormal results are displayed) Labs Reviewed  POC COVID19/FLU A&B COMBO    EKG   Radiology No results found.  Procedures Procedures (including critical care time)  Medications Ordered in UC Medications - No data to display  Initial Impression / Assessment and Plan / UC Course  I have reviewed the triage vital signs and the nursing notes.  Pertinent labs & imaging results that were available during my care of the patient were reviewed by me and considered in my medical decision making (see chart for details).     Patient is overall well-appearing.  Vitals are stable.  COVID and flu testing both negative today.  Discussed follow-up and return precautions. Final Clinical Impressions(s) / UC Diagnoses   Final diagnoses:  Exposure to COVID-19 virus     Discharge Instructions      Tus pruebas de COVID y gripe dieron negativo hoy. Consulta con tu mdico de cabecera o regresa aqu cuando lo necesites.  Your COVID and flu testing are both negative today. Follow-up with your primary care provider or return here as needed.     ED Prescriptions   None    PDMP not reviewed this encounter.   Johnie Flaming A, NP 10/01/24 1227

## 2024-10-01 NOTE — Discharge Instructions (Addendum)
 Tus pruebas de COVID y gripe dieron negativo hoy. Consulta con tu mdico de cabecera o regresa aqu cuando lo necesites.  Your COVID and flu testing are both negative today. Follow-up with your primary care provider or return here as needed.

## 2024-10-06 NOTE — Progress Notes (Signed)
 [facility]  Silveria Bernardy, is a 33 y.o. female  RDW:[id number]  FMW:[id number]  DOB - Jul 11, 1991  Chief Complaint  Patient presents with   Medical Management of Chronic Issues    Sore on lip        Subjective:   Maria Riley is a 33 y.o. female here today for a follow up visit. Hypothyroidism. Denies  slow metabolism, fatigue, weight gain, dry hair/skin, constipation, swelling, decreased memory/brain fog.  Patient has No headache, No chest pain, No abdominal pain - No Nausea, No new weakness tingling or numbness, No Cough - shortness of breath HPI  No problems updated.  Comprehensive ROS Pertinent positive and negative noted in HPI   No Known Allergies  Past Medical History:  Diagnosis Date   Anemia    Medical history non-contributory    Pulmonary embolism (HCC)     Current Outpatient Medications on File Prior to Visit  Medication Sig Dispense Refill   levothyroxine  (SYNTHROID ) 50 MCG tablet Take 1 tablet (50 mcg total) by mouth daily. 30 tablet 0   rizatriptan  (MAXALT ) 10 MG tablet Take 1 tablet (10 mg total) by mouth as needed for migraine. May repeat in 2 hours if needed (Patient not taking: Reported on 09/21/2024) 10 tablet 5   No current facility-administered medications on file prior to visit.   Health Maintenance  Topic Date Due   Hepatitis C Screening  Never done   Hepatitis B Vaccine (1 of 3 - 19+ 3-dose series) Never done   HPV Vaccine (1 - 3-dose SCDM series) Never done   Pap with HPV screening  03/07/2023   COVID-19 Vaccine (1 - 2025-26 season) Never done   Flu Shot  03/06/2025*   DTaP/Tdap/Td vaccine (4 - Td or Tdap) 04/26/2028   HIV Screening  Completed   Pneumococcal Vaccine  Aged Out   Meningitis B Vaccine  Aged Out  *Topic was postponed. The date shown is not the original due date.    Objective:   Vitals:   09/21/24 1521  BP: 96/65  Pulse: 65  SpO2: 99%  Weight: 121 lb (54.9 kg)  Height: 4' 11 (1.499 m)   BP Readings  from Last 3 Encounters:  10/01/24 92/68  09/21/24 96/65  08/01/24 112/65      Physical Exam Vitals reviewed.  Constitutional:      Appearance: Normal appearance.  HENT:     Head: Normocephalic.     Right Ear: Tympanic membrane, ear canal and external ear normal.     Left Ear: Tympanic membrane, ear canal and external ear normal.     Nose: Nose normal.     Mouth/Throat:     Mouth: Mucous membranes are moist.  Eyes:     Extraocular Movements: Extraocular movements intact.     Pupils: Pupils are equal, round, and reactive to light.  Cardiovascular:     Rate and Rhythm: Normal rate.  Pulmonary:     Effort: Pulmonary effort is normal.     Breath sounds: Normal breath sounds.  Abdominal:     General: Bowel sounds are normal.     Palpations: Abdomen is soft.  Musculoskeletal:        General: Normal range of motion.     Cervical back: Normal range of motion.  Skin:    General: Skin is warm and dry.  Neurological:     Mental Status: She is alert and oriented to person, place, and time.  Psychiatric:        Mood  and Affect: Mood normal.        Behavior: Behavior normal.        Thought Content: Thought content normal.       Assessment & Plan  Maria Riley was seen today for medical management of chronic issues.  Diagnoses and all orders for this visit:  Hypothyroidism, unspecified type -     TSH + free T4   HSV-1 infection   Explained cold sore information on AVS voiced understanding   Patient have been counseled extensively about nutrition and exercise. Other issues discussed during this visit include: low cholesterol diet, weight control and daily exercise, foot care, annual eye examinations at Ophthalmology, importance of adherence with medications and regular follow-up. We also discussed long term complications of uncontrolled diabetes and hypertension.    The patient was given clear instructions to go to ER or return to medical center if symptoms don't improve, worsen or  new problems develop. The patient verbalized understanding. The patient was told to call to get lab results if they haven't heard anything in the next week.   This note has been created with Education officer, environmental. Any transcriptional errors are unintentional.   Rosaline SHAUNNA Bohr, NP 10/06/2024, 2:22 PM

## 2024-10-11 ENCOUNTER — Other Ambulatory Visit (INDEPENDENT_AMBULATORY_CARE_PROVIDER_SITE_OTHER): Payer: Self-pay | Admitting: Primary Care

## 2024-10-11 DIAGNOSIS — E039 Hypothyroidism, unspecified: Secondary | ICD-10-CM

## 2024-12-13 ENCOUNTER — Telehealth (INDEPENDENT_AMBULATORY_CARE_PROVIDER_SITE_OTHER): Payer: Self-pay | Admitting: Primary Care

## 2024-12-13 NOTE — Telephone Encounter (Signed)
 Called pt to confirm appt. Pt did not answer and LVM

## 2024-12-14 ENCOUNTER — Telehealth (INDEPENDENT_AMBULATORY_CARE_PROVIDER_SITE_OTHER): Payer: Self-pay | Admitting: Primary Care

## 2024-12-14 ENCOUNTER — Ambulatory Visit (INDEPENDENT_AMBULATORY_CARE_PROVIDER_SITE_OTHER): Admitting: Primary Care

## 2024-12-14 NOTE — Telephone Encounter (Signed)
 Pt states she forgot about appt. Appt will be rescheduled.

## 2024-12-19 ENCOUNTER — Telehealth (INDEPENDENT_AMBULATORY_CARE_PROVIDER_SITE_OTHER): Payer: Self-pay | Admitting: Primary Care

## 2024-12-19 NOTE — Telephone Encounter (Signed)
 Left VM with pt about their upcoming appt. Pt did not answer

## 2024-12-20 ENCOUNTER — Other Ambulatory Visit (HOSPITAL_COMMUNITY)
Admission: RE | Admit: 2024-12-20 | Discharge: 2024-12-20 | Disposition: A | Source: Ambulatory Visit | Attending: Primary Care | Admitting: Primary Care

## 2024-12-20 ENCOUNTER — Ambulatory Visit (INDEPENDENT_AMBULATORY_CARE_PROVIDER_SITE_OTHER): Payer: Self-pay | Admitting: Primary Care

## 2024-12-20 ENCOUNTER — Encounter (INDEPENDENT_AMBULATORY_CARE_PROVIDER_SITE_OTHER): Payer: Self-pay | Admitting: Primary Care

## 2024-12-20 VITALS — BP 90/64 | HR 60 | Resp 16 | Ht 59.0 in | Wt 120.2 lb

## 2024-12-20 DIAGNOSIS — Z124 Encounter for screening for malignant neoplasm of cervix: Secondary | ICD-10-CM

## 2024-12-20 DIAGNOSIS — E039 Hypothyroidism, unspecified: Secondary | ICD-10-CM | POA: Diagnosis not present

## 2024-12-20 DIAGNOSIS — Z1159 Encounter for screening for other viral diseases: Secondary | ICD-10-CM | POA: Diagnosis not present

## 2024-12-20 DIAGNOSIS — I2583 Coronary atherosclerosis due to lipid rich plaque: Secondary | ICD-10-CM

## 2024-12-20 DIAGNOSIS — I2510 Atherosclerotic heart disease of native coronary artery without angina pectoris: Secondary | ICD-10-CM | POA: Diagnosis not present

## 2024-12-20 NOTE — Progress Notes (Signed)
 " [facility]  [facility]OM[facility] Patient name: Maria Riley MRN [id number]  Date of birth: 02/07/1991 Chief Complaint:   Hypothyroidism and Coronary Artery Disease  History of Present Illness:   Maria Riley is a 34 y.o. [phone] female being seen today for a routine well-woman exam.   CC:gyn   The current method of family planning is tubal ligation.  Patient's last menstrual period was 06/08/2021. Last pap  03/06/20 Results were: normal Last mammogram: N/A.  Family h/o breast cancer: No Last colonoscopy: N/A. Results were:  Family h/o colorectal cancer: No  Health Maintenance  Topic Date Due   Hepatitis C Screening  Never done   Hepatitis B Vaccine (1 of 3 - 19+ 3-dose series) Never done   HPV Vaccine (1 - 3-dose SCDM series) Never done   Pap with HPV screening  03/07/2023   COVID-19 Vaccine (1 - 2025-26 season) Never done   Flu Shot  03/06/2025*   DTaP/Tdap/Td vaccine (4 - Td or Tdap) 04/26/2028   HIV Screening  Completed   Pneumococcal Vaccine  Aged Out   Meningitis B Vaccine  Aged Out  *Topic was postponed. The date shown is not the original due date.   Review of Systems:    Denies any headaches, blurred vision, fatigue, shortness of breath, chest pain, abdominal pain, abnormal vaginal discharge/itching/odor/irritation, problems with periods, bowel movements, urination, or intercourse unless otherwise stated above.  Pertinent History Reviewed:   Reviewed past medical,surgical, social and family history.  Reviewed problem list, medications and allergies.  History Maria Riley has a past medical history of Anemia, Medical history non-contributory, and Pulmonary embolism (HCC).   Maria Riley has a past surgical history that includes Cesarean section (N/A, 06/13/2018); Wisdom tooth extraction (Bilateral); IR Radiologist Eval & Mgmt (02/04/2022); IR Catheter Tube Change (02/05/2022); IR Radiologist Eval & Mgmt (02/12/2022); IR IMAGE GUIDED DRAINAGE BY  PERCUTANEOUS CATHETER (02/18/2022); IR Radiologist Eval & Mgmt (03/11/2022); Abdominal hysterectomy; Xi robotic assisted colostomy takedown (N/A, 08/19/2022); and Lysis of adhesion (N/A, 08/19/2022).   Her family history includes Healthy in her father.Maria Riley reports that Maria Riley has never smoked. Maria Riley has never used smokeless tobacco. Maria Riley reports that Maria Riley does not drink alcohol and does not use drugs.  Medications Ordered Prior to Encounter[1]  Physical Assessment:   Vitals:   12/20/24 0917  BP: 90/64  Pulse: 60  Resp: 16  SpO2: 97%  Weight: 120 lb 3.2 oz (54.5 kg)  Height: 4' 11 (1.499 m)  Body mass index is 24.28 kg/m.        Physical Examination:  General appearance - well appearing, and in no distress Mental status - alert, oriented to person, place, and time Psych:  Maria Riley has a normal mood and affect Skin - warm and dry, normal color, no suspicious lesions noted Chest - effort normal, all lung fields clear to auscultation bilaterally Heart - normal rate and regular rhythm Neck:  midline trachea, no thyromegaly or nodules Breasts - breasts appear normal, no suspicious masses, no skin or nipple changes or axillary nodes Educated patient on proper self breast examination and had patient to demonstrate SBE. Abdomen - soft, nontender, nondistended, no masses or organomegaly Pelvic-VULVA: normal appearing vulva with no masses, tenderness or lesions   VAGINA: normal appearing vagina with normal color and discharge, no lesions   CERVIX: normal appearing cervix without discharge or lesions, no CMT UTERUS: uterus is felt to be normal size, shape, consistency and nontender   Extremities:  No swelling or varicosities  noted  No results found for this or any previous visit (from the past 24 hours).   Assessment & Plan:  Maria Riley was seen today for hypothyroidism and coronary artery disease.  Diagnoses and all orders for this visit:  Cervical cancer screening -     Cervicovaginal ancillary  only -     Cytology - PAP  Hypothyroidism, unspecified type -     TSH + free T4  Need for hepatitis B screening test -     Hepatitis B surface antibody,qualitative  Encounter for HCV screening test for low risk patient -     HCV Ab w Reflex to Quant PCR     Follow-up: 3months  This note has been created with Education officer, environmental. Any transcriptional errors are unintentional.   Maria SHAUNNA Bohr, NP 12/20/2024, 9:54 AM      [1]  Current Outpatient Medications on File Prior to Visit  Medication Sig Dispense Refill   levothyroxine  (SYNTHROID ) 50 MCG tablet TAKE 1 TABLET BY MOUTH EVERY DAY 90 tablet 1   rizatriptan  (MAXALT ) 10 MG tablet Take 1 tablet (10 mg total) by mouth as needed for migraine. May repeat in 2 hours if needed (Patient not taking: Reported on 09/21/2024) 10 tablet 5   No current facility-administered medications on file prior to visit.   "

## 2024-12-21 ENCOUNTER — Ambulatory Visit (INDEPENDENT_AMBULATORY_CARE_PROVIDER_SITE_OTHER): Payer: Self-pay | Admitting: Primary Care

## 2024-12-21 DIAGNOSIS — B9689 Other specified bacterial agents as the cause of diseases classified elsewhere: Secondary | ICD-10-CM

## 2024-12-21 LAB — TSH+FREE T4
Free T4: 0.63 ng/dL — ABNORMAL LOW (ref 0.82–1.77)
TSH: 2.45 u[IU]/mL (ref 0.450–4.500)

## 2024-12-21 LAB — CERVICOVAGINAL ANCILLARY ONLY
Bacterial Vaginitis (gardnerella): POSITIVE — AB
Candida Glabrata: NEGATIVE
Candida Vaginitis: NEGATIVE
Chlamydia: NEGATIVE
Comment: NEGATIVE
Comment: NEGATIVE
Comment: NEGATIVE
Comment: NEGATIVE
Comment: NEGATIVE
Comment: NORMAL
Neisseria Gonorrhea: NEGATIVE
Trichomonas: NEGATIVE

## 2024-12-21 LAB — HEPATITIS B SURFACE ANTIBODY,QUALITATIVE: Hep B Surface Ab, Qual: NONREACTIVE

## 2024-12-21 LAB — HCV AB W REFLEX TO QUANT PCR: HCV Ab: NONREACTIVE

## 2024-12-21 LAB — HCV INTERPRETATION

## 2024-12-21 MED ORDER — LEVOTHYROXINE SODIUM 75 MCG PO TABS: 75.0000 ug | ORAL_TABLET | Freq: Every day | ORAL | 1 refills | Status: AC

## 2024-12-21 MED ORDER — METRONIDAZOLE 500 MG PO TABS: 500.0000 mg | ORAL_TABLET | Freq: Two times a day (BID) | ORAL | 0 refills | Status: AC

## 2024-12-22 LAB — CYTOLOGY - PAP
Comment: NEGATIVE
Diagnosis: NEGATIVE
Diagnosis: REACTIVE
High risk HPV: NEGATIVE

## 2025-01-03 ENCOUNTER — Telehealth (INDEPENDENT_AMBULATORY_CARE_PROVIDER_SITE_OTHER): Payer: Self-pay

## 2025-01-03 NOTE — Telephone Encounter (Signed)
 Copied from CRM [id number]. Topic: Clinical - Lab/Test Results >> Jan 03, 2025  1:39 PM Montie POUR wrote: Reason for CRM:  I read her lab results dated 12/21/24 with [employer]: [place] ID # [id number] I cannot make her a lab appointment for 1 month later because there is no lab order in her chart. She would like a nurse to call her back because she has questions about that she has finished her antibiotic she still is having vagina discharge and odor. It is not getting worse but no better. Her number is [phone]. Thanks

## 2025-01-03 NOTE — Telephone Encounter (Signed)
 Maria Riley pt needs a lab appt for 01/20/25  Maria Riley pt states she is still having symptoms

## 2025-01-04 ENCOUNTER — Telehealth (INDEPENDENT_AMBULATORY_CARE_PROVIDER_SITE_OTHER): Payer: Self-pay | Admitting: Primary Care

## 2025-01-04 NOTE — Telephone Encounter (Signed)
 I see pt is scheduled for a lab appt 2/6 but the appt will need to be after 2/14. 2/6 is to soon to recheck lab

## 2025-01-04 NOTE — Telephone Encounter (Signed)
 I was asked to call pt ans sch lab visit. I was able to do that at this time.

## 2025-01-12 ENCOUNTER — Other Ambulatory Visit (INDEPENDENT_AMBULATORY_CARE_PROVIDER_SITE_OTHER): Payer: Self-pay

## 2025-01-22 ENCOUNTER — Other Ambulatory Visit (INDEPENDENT_AMBULATORY_CARE_PROVIDER_SITE_OTHER)
